# Patient Record
Sex: Female | Born: 1935
Health system: Southern US, Community
[De-identification: ages and names within clinical notes are randomized; demographics above are authoritative.]

## PROBLEM LIST (undated history)

## (undated) ENCOUNTER — Emergency Department (HOSPITAL_COMMUNITY): Admission: EM | Disposition: A | Discharge status: Home / Self Care | Payer: Self-pay

## (undated) DIAGNOSIS — I6529 Occlusion and stenosis of unspecified carotid artery: Secondary | ICD-10-CM

## (undated) DIAGNOSIS — K5792 Diverticulitis of intestine, part unspecified, without perforation or abscess without bleeding: Secondary | ICD-10-CM

## (undated) DIAGNOSIS — M1990 Unspecified osteoarthritis, unspecified site: Secondary | ICD-10-CM

## (undated) DIAGNOSIS — J449 Chronic obstructive pulmonary disease, unspecified: Secondary | ICD-10-CM

## (undated) DIAGNOSIS — M47816 Spondylosis without myelopathy or radiculopathy, lumbar region: Secondary | ICD-10-CM

## (undated) DIAGNOSIS — L821 Other seborrheic keratosis: Secondary | ICD-10-CM

## (undated) DIAGNOSIS — K529 Noninfective gastroenteritis and colitis, unspecified: Secondary | ICD-10-CM

## (undated) DIAGNOSIS — I10 Essential (primary) hypertension: Secondary | ICD-10-CM

## (undated) DIAGNOSIS — I4891 Unspecified atrial fibrillation: Secondary | ICD-10-CM

## (undated) HISTORY — DX: Occlusion and stenosis of unspecified carotid artery: I65.29

## (undated) HISTORY — PX: BREAST SURGERY: SHX581

## (undated) HISTORY — DX: Unspecified atrial fibrillation: I48.91

## (undated) HISTORY — PX: CHOLECYSTECTOMY: SHX55

## (undated) HISTORY — DX: Unspecified osteoarthritis, unspecified site: M19.90

## (undated) HISTORY — PX: BREAST EXCISIONAL BIOPSY: SUR124

## (undated) HISTORY — DX: Chronic obstructive pulmonary disease, unspecified: J44.9

## (undated) HISTORY — PX: REPLACEMENT TOTAL KNEE: SUR1224

## (undated) HISTORY — DX: Other seborrheic keratosis: L82.1

## (undated) HISTORY — PX: ABDOMINAL HYSTERECTOMY: SHX81

## (undated) HISTORY — DX: Noninfective gastroenteritis and colitis, unspecified: K52.9

## (undated) HISTORY — PX: JOINT REPLACEMENT: SHX530

---

## 1999-12-05 ENCOUNTER — Encounter: Payer: Self-pay | Admitting: Family Medicine

## 1999-12-05 ENCOUNTER — Encounter: Admission: RE | Admit: 1999-12-05 | Discharge: 1999-12-05 | Payer: Self-pay | Admitting: Family Medicine

## 1999-12-11 ENCOUNTER — Encounter: Admission: RE | Admit: 1999-12-11 | Discharge: 1999-12-11 | Payer: Self-pay | Admitting: Family Medicine

## 1999-12-11 ENCOUNTER — Encounter: Payer: Self-pay | Admitting: Family Medicine

## 2000-06-10 ENCOUNTER — Encounter: Admission: RE | Admit: 2000-06-10 | Discharge: 2000-06-10 | Payer: Self-pay | Admitting: Family Medicine

## 2000-06-10 ENCOUNTER — Encounter: Payer: Self-pay | Admitting: Family Medicine

## 2002-03-06 ENCOUNTER — Encounter: Payer: Self-pay | Admitting: Family Medicine

## 2002-03-06 ENCOUNTER — Encounter: Admission: RE | Admit: 2002-03-06 | Discharge: 2002-03-06 | Payer: Self-pay | Admitting: Family Medicine

## 2002-07-24 ENCOUNTER — Ambulatory Visit (HOSPITAL_COMMUNITY): Admission: RE | Admit: 2002-07-24 | Discharge: 2002-07-24 | Payer: Self-pay | Admitting: *Deleted

## 2002-07-24 ENCOUNTER — Encounter (INDEPENDENT_AMBULATORY_CARE_PROVIDER_SITE_OTHER): Payer: Self-pay | Admitting: *Deleted

## 2002-11-26 ENCOUNTER — Emergency Department (HOSPITAL_COMMUNITY): Admission: EM | Admit: 2002-11-26 | Discharge: 2002-11-26 | Payer: Self-pay | Admitting: Emergency Medicine

## 2002-11-27 ENCOUNTER — Ambulatory Visit (HOSPITAL_COMMUNITY): Admission: RE | Admit: 2002-11-27 | Discharge: 2002-11-28 | Payer: Self-pay | Admitting: General Surgery

## 2002-11-27 ENCOUNTER — Encounter (INDEPENDENT_AMBULATORY_CARE_PROVIDER_SITE_OTHER): Payer: Self-pay | Admitting: *Deleted

## 2003-03-04 ENCOUNTER — Ambulatory Visit (HOSPITAL_COMMUNITY): Admission: RE | Admit: 2003-03-04 | Discharge: 2003-03-04 | Payer: Self-pay | Admitting: Family Medicine

## 2003-03-24 ENCOUNTER — Encounter: Admission: RE | Admit: 2003-03-24 | Discharge: 2003-03-24 | Payer: Self-pay | Admitting: Family Medicine

## 2003-04-06 ENCOUNTER — Encounter: Admission: RE | Admit: 2003-04-06 | Discharge: 2003-04-06 | Payer: Self-pay | Admitting: Family Medicine

## 2004-03-29 ENCOUNTER — Encounter: Admission: RE | Admit: 2004-03-29 | Discharge: 2004-03-29 | Payer: Self-pay | Admitting: Gastroenterology

## 2004-04-10 ENCOUNTER — Encounter: Admission: RE | Admit: 2004-04-10 | Discharge: 2004-04-10 | Payer: Self-pay | Admitting: Family Medicine

## 2004-06-14 ENCOUNTER — Inpatient Hospital Stay (HOSPITAL_COMMUNITY): Admission: EM | Admit: 2004-06-14 | Discharge: 2004-06-17 | Payer: Self-pay | Admitting: Emergency Medicine

## 2004-06-17 ENCOUNTER — Ambulatory Visit: Payer: Self-pay | Admitting: Gastroenterology

## 2005-01-18 ENCOUNTER — Encounter (INDEPENDENT_AMBULATORY_CARE_PROVIDER_SITE_OTHER): Payer: Self-pay | Admitting: Specialist

## 2005-01-18 ENCOUNTER — Ambulatory Visit (HOSPITAL_COMMUNITY): Admission: RE | Admit: 2005-01-18 | Discharge: 2005-01-18 | Payer: Self-pay | Admitting: Gastroenterology

## 2005-04-16 ENCOUNTER — Encounter: Admission: RE | Admit: 2005-04-16 | Discharge: 2005-04-16 | Payer: Self-pay | Admitting: Family Medicine

## 2005-08-21 ENCOUNTER — Encounter: Admission: RE | Admit: 2005-08-21 | Discharge: 2005-08-21 | Payer: Self-pay | Admitting: Family Medicine

## 2006-01-31 ENCOUNTER — Encounter: Admission: RE | Admit: 2006-01-31 | Discharge: 2006-01-31 | Payer: Self-pay | Admitting: Family Medicine

## 2006-04-19 ENCOUNTER — Encounter: Admission: RE | Admit: 2006-04-19 | Discharge: 2006-04-19 | Payer: Self-pay | Admitting: Family Medicine

## 2006-11-19 ENCOUNTER — Encounter: Admission: RE | Admit: 2006-11-19 | Discharge: 2006-11-19 | Payer: Self-pay | Admitting: Family Medicine

## 2006-11-22 ENCOUNTER — Ambulatory Visit (HOSPITAL_COMMUNITY): Admission: RE | Admit: 2006-11-22 | Discharge: 2006-11-22 | Payer: Self-pay | Admitting: Family Medicine

## 2006-12-24 ENCOUNTER — Inpatient Hospital Stay (HOSPITAL_COMMUNITY): Admission: RE | Admit: 2006-12-24 | Discharge: 2006-12-29 | Payer: Self-pay | Admitting: Orthopedic Surgery

## 2007-04-21 ENCOUNTER — Encounter: Admission: RE | Admit: 2007-04-21 | Discharge: 2007-04-21 | Payer: Self-pay | Admitting: Family Medicine

## 2008-01-23 HISTORY — PX: CAROTID ENDARTERECTOMY: SUR193

## 2008-05-07 ENCOUNTER — Encounter: Admission: RE | Admit: 2008-05-07 | Discharge: 2008-05-07 | Payer: Self-pay | Admitting: Family Medicine

## 2008-10-18 ENCOUNTER — Ambulatory Visit: Payer: Self-pay | Admitting: Vascular Surgery

## 2008-10-20 ENCOUNTER — Encounter: Payer: Self-pay | Admitting: Vascular Surgery

## 2008-10-20 ENCOUNTER — Ambulatory Visit: Payer: Self-pay | Admitting: Vascular Surgery

## 2008-10-20 ENCOUNTER — Inpatient Hospital Stay (HOSPITAL_COMMUNITY): Admission: RE | Admit: 2008-10-20 | Discharge: 2008-10-21 | Payer: Self-pay | Admitting: Vascular Surgery

## 2008-11-02 ENCOUNTER — Ambulatory Visit: Payer: Self-pay | Admitting: Vascular Surgery

## 2009-05-05 ENCOUNTER — Ambulatory Visit: Payer: Self-pay | Admitting: Vascular Surgery

## 2009-05-09 ENCOUNTER — Encounter: Admission: RE | Admit: 2009-05-09 | Discharge: 2009-05-09 | Payer: Self-pay | Admitting: Family Medicine

## 2009-12-27 ENCOUNTER — Ambulatory Visit: Payer: Self-pay | Admitting: Vascular Surgery

## 2010-04-28 LAB — CBC
HCT: 43.3 % (ref 36.0–46.0)
MCHC: 34.3 g/dL (ref 30.0–36.0)
MCV: 86.4 fL (ref 78.0–100.0)
MCV: 86.6 fL (ref 78.0–100.0)
Platelets: 298 10*3/uL (ref 150–400)
Platelets: 347 10*3/uL (ref 150–400)
RBC: 4.46 MIL/uL (ref 3.87–5.11)
RBC: 5.01 MIL/uL (ref 3.87–5.11)
RDW: 13.2 % (ref 11.5–15.5)
RDW: 13.5 % (ref 11.5–15.5)

## 2010-04-28 LAB — BASIC METABOLIC PANEL
Calcium: 8.5 mg/dL (ref 8.4–10.5)
Creatinine, Ser: 0.76 mg/dL (ref 0.4–1.2)
Glucose, Bld: 146 mg/dL — ABNORMAL HIGH (ref 70–99)
Potassium: 3.6 mEq/L (ref 3.5–5.1)
Sodium: 136 mEq/L (ref 135–145)

## 2010-04-28 LAB — COMPREHENSIVE METABOLIC PANEL
ALT: 31 U/L (ref 0–35)
Alkaline Phosphatase: 73 U/L (ref 39–117)
BUN: 11 mg/dL (ref 6–23)
CO2: 26 mEq/L (ref 19–32)
Calcium: 9.2 mg/dL (ref 8.4–10.5)
Chloride: 104 mEq/L (ref 96–112)
GFR calc Af Amer: >60 mL/min (ref >60)
Potassium: 3.5 mEq/L (ref 3.5–5.1)

## 2010-04-28 LAB — URINE MICROSCOPIC-ADD ON

## 2010-04-28 LAB — URINALYSIS, ROUTINE W REFLEX MICROSCOPIC
Bilirubin Urine: NEGATIVE
Hgb urine dipstick: NEGATIVE
Ketones, ur: NEGATIVE mg/dL
Protein, ur: NEGATIVE mg/dL
Urobilinogen, UA: 0.2 mg/dL (ref 0.0–1.0)

## 2010-04-28 LAB — CROSSMATCH
ABO/RH(D): O POS
Antibody Screen: NEGATIVE

## 2010-04-28 LAB — PROTIME-INR: INR: 1 (ref 0.00–1.49)

## 2010-04-28 LAB — APTT: aPTT: 29 seconds (ref 24–37)

## 2010-04-30 ENCOUNTER — Inpatient Hospital Stay (INDEPENDENT_AMBULATORY_CARE_PROVIDER_SITE_OTHER)
Admission: RE | Admit: 2010-04-30 | Discharge: 2010-04-30 | Disposition: A | Discharge status: Home / Self Care | Payer: Medicare Other | Source: Ambulatory Visit | Attending: Emergency Medicine | Admitting: Emergency Medicine

## 2010-04-30 DIAGNOSIS — J069 Acute upper respiratory infection, unspecified: Secondary | ICD-10-CM

## 2010-06-01 ENCOUNTER — Other Ambulatory Visit: Payer: Self-pay | Admitting: Family Medicine

## 2010-06-01 DIAGNOSIS — Z1231 Encounter for screening mammogram for malignant neoplasm of breast: Secondary | ICD-10-CM

## 2010-06-06 NOTE — Procedures (Signed)
CAROTID DUPLEX EXAM   INDICATION:  Carotid disease, possible preop.   HISTORY:  Diabetes:  No.  Cardiac:  No.  Hypertension:  Yes.  Smoking:  Previous.  Previous Surgery:  No.  CV History:  Asymptomatic.  Amaurosis Fugax No, Paresthesias No, Hemiparesis No                                       RIGHT             LEFT  Brachial systolic pressure:         150               144  Brachial Doppler waveforms:         WNL               WNL  Vertebral direction of flow:                          antegrade  DUPLEX VELOCITIES (cm/sec)  CCA peak systolic                                     M=106, D=235.  ECA peak systolic                                     206  ICA peak systolic                                     420  ICA end diastolic                                     93  PLAQUE MORPHOLOGY:                                    heterogeneous  PLAQUE AMOUNT:                                        severe  PLAQUE LOCATION:  bifurcation/origins of ICA and ECA   IMPRESSION:  1. Limited left-sided-only study.  2. Focal stenosis of distal common carotid artery with velocities of      235 cm/s. extending into the origin of internal carotid artery and      external carotid artery.  3. Left internal carotid artery velocities are suggestive of high-end      60-79% stenosis.  4. Left external carotid artery stenosis.   ___________________________________________  Quita Skye Hart [place], M.D.   AS/MEDQ  D:  10/18/2008  T:  10/18/2008  Job:  [id number]

## 2010-06-06 NOTE — Op Note (Signed)
NAME:  [name], [name] NO.:  [id number]   MEDICAL RECORD NO.:  [id number]           PATIENT TYPE:   LOCATION:                                 FACILITY:   PHYSICIAN:  Burnard Bunting, M.D.    DATE OF BIRTH:  09-May-1934   DATE OF PROCEDURE:  DATE OF DISCHARGE:                               OPERATIVE REPORT   PREOPERATIVE DIAGNOSIS:  Right knee arthritis.   POSTOPERATIVE DIAGNOSIS:  Right knee arthritis.   PROCEDURE:  Right total knee replacement.   SURGEON:  Burnard Bunting, M.D.   ASSISTANT:  Wende Neighbors, P.A.   ANESTHESIA:  General endotracheal.   ESTIMATED BLOOD LOSS:  75 mL.   DRAINS:  None.   TOURNIQUET TIME:  1 hour 30 minutes at 300 mmHg.   INDICATIONS:  [name] is a 76 year old patient with end-stage knee  arthritis affecting her ADLs, __________  and rest pain and presents for  total knee replacement after explanation of risks and benefits.   PROCEDURE IN DETAIL:  The patient was brought to the operating room  where general endotracheal anesthesia was induced.  Perioperative  antibiotics were administered.  The patient's right leg was prepped with  DuraPrep solution and draped in a sterile manner.  [name] was used to  cover the operative field.  The leg was elevated and exsanguinated with  an Esmarch wrap and the tourniquet was inflated.  Over a bolster  anterior approach to the knee was utilized with skin and subcutaneous  tissue sharply divided and a median parapatellar arthrotomy was made.  Precise location of this arthrotomy was marked by a #1 Vicryl suture.  Patella was everted.  Synovitis was present and was removed.  The  lateral patellofemoral ligament was then removed.  Periosteal sleeve was  removed off the medial proximal tibia.  Osteophytes were removed from  the femur and tibia.  ACL and PCL was released.  The soft tissue and  synovitis was removed to the anterior distal part of the femur.  Two  pins were placed in bicortical  fashion through the anterior distal  medial femur and proximal medial tibia.  At this time the registration  points were obtained including the center rotation of the hip, medial  malleolar axis and the various points about the knee in accordance with  preoperative templating.  The mechanical axis was cut perpendicular and  mechanical axis was made with the collateral and cruciate ligaments well  protected.  At this time tensioner was placed in extension and flexion.  Distal femoral cut was then made and checked with spacer block, full  extension was possible with a 10 mm spacer.  Chamfer cuts were then  performed.  Box cut was performed.  Final preparation of the tibial  plateau was made.  The trial components were placed with the 10 spacer,  3 femur and 3 tibia.  At this time the patella was repaired with 10 mm  cut made off of the patella.  The trial button was placed, 38 mm.  The  knee was taken  through a range of motion and found to have full  extension, full flexion with good stability and varus-valgus stress at  0, 30 and 90 degrees.  At this time trial components were removed.  True  components were cemented in position with maintenance of patellar  stability parameters and varus-valgus stability.  Excess cement was  removed.  The tourniquet was then released after cement hardening had  occurred.  Bleeding points were then controlled using electrocautery.  Thorough irrigation was performed before cementing and after cementing.  The arthrotomy was then closed over a bolster with interrupted inverted  #1 Vicryl suture, 0 Vicryl suture and interrupted inverted 2-0 Vicryl  suture and skin staples.  The incisions for the pins were also closed  using 3-0 nylon suture.  At this time the solution of Marcaine, morphine  and clonidine was injected into the knee.  The patient tolerated the  procedure well without immediate complications.  Bulky dressing and knee  immobilizer was placed.  It  should be noted that the assistance of  Maud Deed was required at all times during the case for retraction  of important neurovascular structures.  Her assistance was a medical  necessity during the case.      Burnard Bunting, M.D.  Electronically Signed     GSD/MEDQ  D:  12/24/2006  T:  12/24/2006  Job:  [id number]

## 2010-06-06 NOTE — Procedures (Signed)
CAROTID DUPLEX EXAM   INDICATION:  Left CEA.   HISTORY:  Diabetes:  No.  Cardiac:  No.  Hypertension:  Yes.  Smoking:  Previous.  Previous Surgery:  Left CEA 10/20/2008.  CV History:  Asymptomatic.  Amaurosis Fugax No, Paresthesias No, Hemiparesis No.                                       RIGHT             LEFT  Brachial systolic pressure:         134               140  Brachial Doppler waveforms:         WNL               WNL  Vertebral direction of flow:        Antegrade         Antegrade  DUPLEX VELOCITIES (cm/sec)  CCA peak systolic                   81                106  ECA peak systolic                   584               171  ICA peak systolic                   84 (mid)          93  ICA end diastolic                   24                19  PLAQUE MORPHOLOGY:                  Heterogenous      Soft  PLAQUE AMOUNT:                      Severe            Mild  PLAQUE LOCATION:                    ECA               CCA/ICA   IMPRESSION:  1. Patent left carotid endarterectomy with mild replaquing.  2. Severe right external carotid artery stenosis.  3. 1% to 39% right internal carotid artery stenosis.  4. Antegrade vertebral arteries bilaterally.  5. No significant change from the prior study of 05/05/2009.   ___________________________________________  Quita Skye. Hart [place], M.D.   LT/MEDQ  D:  12/27/2009  T:  12/27/2009  Job:  [id number]

## 2010-06-06 NOTE — H&P (Signed)
HISTORY AND PHYSICAL EXAMINATION   October 18, 2008   Re:  [name], [name]             DOB:  Jul 19, 1934   CHIEF COMPLAINT:  Severe left internal carotid stenosis - Asymptomatic.   HISTORY OF PRESENT ILLNESS:  This is a 76 year old female patient of Dr.  Manus Gunning has been known to have some carotid occlusive disease with an  ultrasound having been done 7 years ago.  The study was repeated on  September 3rd, which revealed progression of disease to an approximate  80% left internal carotid stenosis with mild right internal carotid  stenosis.  The study was repeated in the [facility] office on September 27  confirming an 80-90% left internal carotid stenosis.  She had no  symptoms of cerebral ischemia, including hemiparesis, aphasia, amaurosis  fugax, diplopia, blurred vision, or syncope.  Has had no previous  history of stroke, now scheduled for an elective left carotid  endarterectomy.   PAST MEDICAL HISTORY:  1. Hypertension.  2. Osteoarthritis.  3. GERD.  4. History of diverticulosis.  5. Negative for diabetes, hyperlipidemia, COPD, or coronary artery      disease.   PREVIOUS SURGERY:  1. Total knee replacement in 2008 on the right.  2. Hysterectomy.  3. Cholecystectomy.   FAMILY HISTORY:  Positive for coronary artery disease in a brother who  died of myocardial infarction.  Diabetes in a brother and sister.  Mini  strokes in a sister.   SOCIAL HISTORY:  The patient is widowed and is retired.  She smoked a  pack-and-a-half of cigarettes a day for 50 years, but stopped in 2004.  Does not use alcohol.   REVIEW OF SYSTEMS:  Please see health history exam.  She does have  dyspnea on exertion, history of reflux esophagitis, discomfort in the  legs with walking, headaches, arthritis, joint pain.  All other systems  negative.   ALLERGIES:  None known.   MEDICATIONS:  1. Dicyclomine 5 mg.  2. Sertraline 50 mg.  3. Famotidine 75 mg.  4. Amlodipine benazepril 10  mg.  5. Losartan potassium 50 mg  6. Lorazepam 1 mg.  7. Pulmicort Flexhaler 180 mg p.r.n.  8. Voltaren gel p.r.n.  9. Prednisone 5 mg.  10.Aspirin 81 mg daily.  11.Fish oil capsule 1200 mg per day.  12.Multivitamin daily.   PHYSICAL EXAM:  Blood pressure 120/74, heart rate 66, respirations 18.  Generally, this is a healthy-appearing female in no apparent distress.  Alert and oriented x3.  Neck is supple.  3+ carotid pulses with  bilateral bruits over the carotid bifurcations.  Neurologic exam is  normal.  No palpable adenopathy in the neck.  Chest is clear to  auscultation.  Cardiovascular exam is a regular rhythm.  No murmurs.  Abdomen is soft, nontender with no masses.  3+ femoral, popliteal, and  dorsalis pedis pulses bilaterally.  There is no evidence of venous  insufficiency.   Carotid duplex exam results from [facility] and [facility] were reviewed and both  reveal severe left internal carotid stenosis.   IMPRESSION:  Severe left internal carotid stenosis - Asymptomatic.   PLAN:  To admit the patient on September 29 for an elective carotid  endarterectomy.  The risks and benefits have been thoroughly discussed  with the patient.  She would like to proceed.   Quita Skye Hart [place], M.D.  Electronically Signed   JDL/MEDQ  D:  10/18/2008  T:  10/18/2008  Job:  [id number]  cc:   Bryan Lemma. Manus Gunning, M.D.

## 2010-06-06 NOTE — Assessment & Plan Note (Signed)
OFFICE VISIT   TANAIA, HAWKEY  DOB:  [birth year]                                       11/02/2008  CHART#:[id number]   The patient returns today for initial followup regarding her carotid  endarterectomy performed by me on September 29 for severe left internal  carotid stenosis which is asymptomatic.  She has mild right internal  carotid occlusive disease.  She has had no neurologic complications or  specific complaints since her surgery.  She is swallowing well, has no  hoarseness and has resumed her daily aspirin.  She is also resuming her  normal activity level.   PHYSICAL EXAM:  Blood pressure 138/75, heart rate 79, respirations 14.  Left neck incision is well-healed.  Carotid pulses are 3+ with no  audible bruits.  Neurologic exam is normal.   I am very pleased with her early result and I encouraged her to continue  to increase her activity as tolerated.  She will plan to return in 6  months with followup carotid duplex exam at that time unless she  develops any neurologic symptoms in the interim.   Quita Skye Hart [place], M.D.  Electronically Signed   JDL/MEDQ  D:  11/02/2008  T:  11/03/2008  Job:  [id number]   cc:   Bryan Lemma. Manus Gunning, M.D.

## 2010-06-06 NOTE — Procedures (Signed)
CAROTID DUPLEX EXAM   INDICATION:  Status post carotid endarterectomy.   HISTORY:  Diabetes:  No.  Cardiac:  No.  Hypertension:  Yes.  Smoking:  Previous.  Previous Surgery:  Left carotid endarterectomy on 10/20/2008.  CV History:  Currently asymptomatic.  Amaurosis Fugax No, Paresthesias No, Hemiparesis No.                                       RIGHT             LEFT  Brachial systolic pressure:         138               130  Brachial Doppler waveforms:         Normal            Normal  Vertebral direction of flow:        Antegrade         Antegrade  DUPLEX VELOCITIES (cm/sec)  CCA peak systolic                   60                103  ECA peak systolic                   527               136  ICA peak systolic                   45                101  ICA end diastolic                   14                29  PLAQUE MORPHOLOGY:                  Heterogenous  PLAQUE AMOUNT:                      Moderate/severe   None  PLAQUE LOCATION:                    ECA   IMPRESSION:  1. No evidence of right internal carotid artery stenosis.  2. Patent left carotid endarterectomy site with no left internal      carotid artery stenosis noted.  3. Right external carotid artery stenosis noted.  4. Significant decrease of the left internal carotid artery velocities      noted when compared to the previous examination on 10/18/2008.   ___________________________________________  Quita Skye. Hart [place], M.D.   CH/MEDQ  D:  05/05/2009  T:  05/05/2009  Job:  [id number]

## 2010-06-08 ENCOUNTER — Ambulatory Visit
Admission: RE | Admit: 2010-06-08 | Discharge: 2010-06-08 | Disposition: A | Discharge status: Home / Self Care | Payer: Medicare Other | Source: Ambulatory Visit | Attending: Family Medicine | Admitting: Family Medicine

## 2010-06-08 DIAGNOSIS — Z1231 Encounter for screening mammogram for malignant neoplasm of breast: Secondary | ICD-10-CM

## 2010-06-09 NOTE — Consult Note (Signed)
[name], LYNE                   ACCOUNT NO.:  [id number]   MEDICAL RECORD NO.:  [id number]          PATIENT TYPE:  INP   LOCATION:  0348                         FACILITY:  [facility]   PHYSICIAN:  Jordan Hawks. Elnoria Howard, MD    DATE OF BIRTH:  1934-10-31   DATE OF CONSULTATION:  DATE OF DISCHARGE:                                   CONSULTATION   DATE OF CONSULTATION:  Jun 16, 2004.   REASON FOR CONSULTATION:  Diarrhea.   PRIMARY CARE PHYSICIAN:  Dr. Bryan Lemma. Ehinger.   HISTORY OF PRESENT ILLNESS:  This is a 76 year old white female with a past  medical history of hypertension, diverticulosis, anxiety, gastroesophageal  reflux disease and irritable bowel, who presents with worsening diarrhea  over the past several weeks as well as weakness.  The patient was initially  seen by Dr. Roosvelt Harps in 2004 with subsequent colonoscopy and  biopsies.  Unfortunately the biopsies were not available for review, and per  the patient's report she was not informed of any further treatment action as  a result of the colonoscopy.  The patient subsequently transferred her care  to Dr. Anselmo Rod, and she was initially felt to have irritable bowel  syndrome versus post cholecystectomy diarrhea.  Her symptoms waxed and  waned, and it was difficult to discern the exact etiology.  She was  subsequently seen by Dr. Elnoria Howard on May 17, 2004 with complaints of worsening  diarrhea.  However, this was not a large amount of stool.  She stated that  she had multiple small bowel movements at this time.  She was also noted to  be grieving the loss of her husband recently, and given the diagnosis of  anxiety and also irritable bowel syndrome, it was felt that she may be  having an exacerbation of these type of symptoms.  It was recommended at  that time that the patient maintain a high-fiber diet with Metamucil.  She  reported improvement in her symptoms for a short duration.  However, she  stated her bowel  movements began to worsen again.  She reported having 10-15  watery bowel movements per day with some cramping.  No nausea or vomiting.  No fevers.  No evidence of any hematochezia.  Upon admission to [facility], she was noted to be dehydrated and subsequently improved with IV  hydration.  The patient denies any hematemesis, hematochezia or melena.   PAST MEDICAL/SURGICAL HISTORY:  As stated above.   MEDICATIONS:  1.  Metamucil.  2.  Questran.  3.  Aspirin.  4.  Benicar.  5.  Lorazepam.  6.  Zoloft.  7.  Bentyl.   ALLERGIES:  1.  AMOXICILLIN.  2.  TETRACYCLINE.   SOCIAL HISTORY:  The patient lives alone and is a widow.  She denies any  alcohol.  She has a prior history of tobacco abuse.   FAMILY HISTORY:  Noncontributory.   REVIEW OF SYSTEMS:  Negative for any dysuria, arthralgias, arthritis, chest  pain, shortness of breath, headache, dizziness, vertigo.  GI symptoms are  stated above.   PHYSICAL EXAMINATION:  VITAL SIGNS:  Stable.  GENERAL:  The patient is in no acute distress.  Alert and oriented.  HEENT:  Normocephalic, atraumatic.  Extraocular muscles intact.  Pupils are  equal, round and reactive to light.  NECK:  Supple.  No lymphadenopathy.  LUNGS:  Clear to auscultation bilaterally.  CARDIOVASCULAR:  Regular rate and rhythm without murmurs, gallops or rubs.  ABDOMEN:  Soft, nontender, nondistended.  Positive bowel sounds.  No  hepatosplenomegaly.  EXTREMITIES:  No clubbing, cyanosis or edema.  NEUROLOGIC:  No focal deficits noted.   LABORATORY VALUES:  On admission, white blood cell count 9.6, hemoglobin  14.9, MCV 84.9, platelets 407.  Sodium 133, potassium 3.2, chloride 94, CO2  25, glucose 125, BUN 11, creatinine 1.6.  Calcium is 10.1.   IMPRESSION:  1.  Colitis with resultant diarrhea.  2.  Dehydration secondary to the colitis.   The patient does have a 2-year history of diarrhea, and because of her  history of cholecystitis and possible  irritable bowel syndrome, it was  difficult to discern the exact etiology of the colitis.  The biopsy reports  were not available at the time of evaluation in the office.  However, upon  review of the random colonic biopsies, it is clear that she does have active  colitis.  The colitis is not indicative of a microscopic colitis.  However,  there is just suspicion of possible NSAID use as a result of this.  There is  a large number of eosinophils, which can also lead to the suspicion of  parasites.  However, none have been detected at this time.  It is likely  that the patient will improve with Asacol to help resolve the inflammation.   PLAN:  1.  Asacol 400 mg, 2 tablets p.o. t.i.d. starting today.  2.  Avoid all NSAIDs.      PDH/MEDQ  D:  06/16/2004  T:  06/16/2004  Job:  [id number]   cc:   Bryan Lemma. Manus Gunning, M.D.  [address] [place]  [place] [address]  Fax: [phone]

## 2010-06-09 NOTE — H&P (Signed)
NAMEELLIANAH, CORDY                   ACCOUNT NO.:  [id number]   MEDICAL RECORD NO.:  [id number]          PATIENT TYPE:  INP   LOCATION:  0104                         FACILITY:  [facility]   PHYSICIAN:  Kela Millin, M.D.DATE OF BIRTH:  09-Apr-1934   DATE OF ADMISSION:  06/14/2004  DATE OF DISCHARGE:                                HISTORY & PHYSICAL   PRIMARY CARE PHYSICIAN:  Dr. Blair Heys.   CHIEF COMPLAINT:  Weakness and persistent diarrhea.   HISTORY OF PRESENT ILLNESS:  The patient is a 76 year old white female with  past medical history significant for irritable bowel syndrome, GERD,  hypertension, diverticulosis, anxiety who presents with complaints of  weakness and worsening diarrhea over the past couple of weeks. She states  that she has been having diarrhea daily for the past 2-3 months but in the  past couple of weeks that has worsened. She reports an average of 10 to 15  watery stools per day, watery, with mucus present and she usually has some  cramping before the bowel movement and that cramping is usually relieved by  the bowel movement. She admits to nausea with no vomiting but in the past 2  days she states she has just not been able to eat anything. The patient  reports that she saw Dr. Loreta Ave in March and at that time she was started on  the Questran but that did not help her diarrhea. She went back a couple of  weeks later and was seen by Dr. Elnoria Howard and started on Metamucil which seemed  to help initially, but in the past weeks the diarrhea has worsened. Ms. Battey  denies hematemesis, melena, hematochezia, fevers, dysuria, cough, and no  chest pain.   The patient was seen in the emergency room and her lab work and physical  exam were consistent with volume depletion, electrolyte abnormalities, and  she is admitted to the [facility] for further evaluation and  management.   PAST MEDICAL HISTORY:  As stated above.   MEDICATIONS:  1.  Zoloft 50 mg  p.o. daily.  2.  Lorazepam 1 mg p.o. b.i.d.  3.  Benicar - the patient does not know the dose.  4.  Aspirin 81 mg p.o. daily.  5.  Questran.  6.  Metamucil.  7.  Bentyl.   The patient states she stopped per Prevacid because she thought it was  aggravating her diarrhea.   ALLERGIES:  AMOXICILLIN and TETRACYCLINE.   SOCIAL HISTORY:  She quit tobacco 1-and-a-half years ago. She denies  alcohol.   FAMILY HISTORY:  A brother and two sisters with diabetes mellitus and all  her siblings have hypertension. She has two brothers with heart problems  at age 28 and 66.   REVIEW OF SYSTEMS:  As per HPI. Other comprehensive review of systems  negative.   PHYSICAL EXAMINATION:  GENERAL:  The patient is a pleasant, elderly white  female, alert and oriented x3, in no apparent distress.  VITAL SIGNS:  Blood pressure is 126/80 with a temperature of 98, pulse 69,  respiratory rate 20,  O2 saturation of 96%.  HEENT:  PERRL, EOMI, sclerae anicteric, dry mucous membranes, no oral  exudates.  NECK:  Supple. No adenopathy, no carotid bruits appreciated, and no  thyromegaly.  LUNGS:  Clear to auscultation bilaterally. No crackles or wheezes.  CARDIOVASCULAR:  Regular rate and rhythm, normal S1, S2.  ABDOMEN:  Soft, bowel sounds present, nontender, nondistended. No  organomegaly and no masses palpable.  EXTREMITIES:  No cyanosis and no edema.  NEUROLOGIC:  Alert and oriented x3. Cranial nerves II-XII grossly intact.  Nonfocal exam.   LABORATORY DATA:  Her white cell count is 9.6 with a hemoglobin of 14.9,  hematocrit 44.3, and her neutrophil count is 78%. Her sodium is 133 with a  potassium of 3.2, chloride is 94, CO2 is 25, her glucose is 125, BUN is 11,  creatinine is 1.6, calcium 10.1. On her urinalysis there is small leukocyte  esterase and urine nitrite is negative, 3-6 wbc's, the specific gravity is  1.024, and appearance is cloudy, the urine pH is 5.   ASSESSMENT AND PLAN:  1.  Diarrhea.  The patient is a 76 year old with irritable bowel syndrome and      diarrhea has worsened in the past couple of weeks in spite of Metamucil      and Questran. Will obtain stool study/cultures to evaluate for      infectious etiology. The patient was seen by Dr. Loreta Ave in March, follow      and consult GI pending above for further recommendations/possible      colonoscopy.  2.  Volume depletion/renal insufficiency. Hydrate with IV fluids and recheck      BMET. Will hold ARB for now.  3.  Irritable bowel syndrome. Continue Bentyl, Metamucil, and Questran.  4.  Hypertension. Monitor blood pressures, hold ARB for now as creatinine      increased. Follow and resume as appropriate when the patient is volume      replete.  5.  Gastroesophageal reflux disease. As already discussed, was previously on      a PPI but discontinued it because it worsened her diarrhea.  6.  History of anxiety. Continue Ativan.      ACV/MEDQ  D:  06/14/2004  T:  06/14/2004  Job:  [id number]   cc:   Bryan Lemma. Manus Gunning, M.D.  [address] [place]  [place] [address]  Fax: [phone]

## 2010-06-09 NOTE — Discharge Summary (Signed)
[name], [name]                   ACCOUNT NO.:  [id number]   MEDICAL RECORD NO.:  [id number]          PATIENT TYPE:  INP   LOCATION:  0348                         FACILITY:  [facility]   PHYSICIAN:  Lori L. Ladona Ridgel, MD  DATE OF BIRTH:  1934-04-18   DATE OF ADMISSION:  06/14/2004  DATE OF DISCHARGE:  06/17/2004                                 DISCHARGE SUMMARY   DISCHARGE DIAGNOSES:  1.  Unspecified colitis.  The patient has responded favorably to the      addition of Asacol 400 mg t.i.d.  She was seen in consult by      gastroenterology and Dr. Haywood Frey service, as well as being covered by Dr.      Russella Frey.  The patient states that this morning she was able to sleep      through the night for the first time without having to get up to move      her bowels.  Her morning bowel movement was formed, which is not her      usual state.  She generally feels fantastic related to these events.      The patient will be discharged to home with Asacol, Metamucil, Bentyl as      needed, and will discontinue her Questran.  2.  Dehydration.  This is secondary to colitis.  This appears to have      resolved significantly.  Her blood pressure is stable off of her      Benicar.  At this time I will request that with the recent dehydration      and acute elevation in her creatinine that we will hold off on her ARB      until she sees her primary care physician next week.  3.  Hypertension.  At this time it is moderately elevated but clinically      stable off of her Benicar.  In light of the mild elevation in her      creatinine, I will request that she hold her Benicar until she sees Dr.      Gaylan Frey next week.  4.  Anxiety disorder.  The patient will continue her Zoloft 50 mg daily and      her lorazepam 1 mg p.o. b.i.d.  5.  The patient has been on a prophylactic aspirin daily, which was held      during the course of the admission.  There is no reason not to restart      this in light of the fact that her  diarrhea is under control.  We will      resume her aspirin 81 mg daily.  6.  Reflux.  The patient had discontinued her Prevacid because she thought      it was aggravating her diarrhea.  At this time I will allow Dr. Elnoria Frey to      resume that when he sees her in two-four weeks.   MEDICATIONS AT THE TIME OF DISCHARGE:  1.  Asacol 400 mg two tablets p.o. t.i.d.  2.  Metamucil one tablet daily.  3.  Zoloft 50 mg daily.  4.  Lorazepam 1 mg p.o. b.i.d.  5.  Aspirin 81 mg can be resumed.  6.  Bentyl 20 mg four times daily.  This can become as needed as per Dr.      Russella Frey.  7.  Benicar will continue to be held until she sees Dr. Gaylan Frey in the next      week.  8.  Her Questran will be discontinued.   HISTORY OF PRESENT ILLNESS:  The patient is a 76 year old white female with  a past medical history known for irritable bowel syndrome, GERD,  hypertension, diverticulosis, as well as a diagnosis of anxiety disorder.  She was admitted on Jun 14, 2004 with a complaint of weakness and worsening  diarrhea over the past couple of weeks.  The patient was found to have acute  renal insufficiency likely related to dehydration secondary to her diarrhea.  She was admitted to the telemetry floor for further evaluation and  treatment.  The patient was rehydrated.  Medications thought to be  exacerbating her diarrhea were held, including her Benicar and aspirin.  She  was evaluated for possible C. difficile colitis, which was found to be  negative.  On Jun 16, 2004 the patient was seen in consultation by Dr. Elnoria Frey,  who started her on Asacol 400 mg two tablets p.o. b.i.d. for treatment of  colitis.  Over the course of the night the patient responded favorably with  decreased stooling.  This morning her stool was firm and was more formed and  she felt well.  Her blood pressure remained stable but slightly elevated.  She will therefore need evaluation as an outpatient for resumption of her  ERB.  On the day of  discharge the patient's vital signs are stable.  Blood  pressure was 154/77, pulse was 67, respirations were 20, temperature was  97.8.  Her saturations were 98% on room air.  Generally she is a well-  developed well-nourished white female in acute distress.  Pupils equal,  round, and reactive to light.  Extra ocular muscles are intact.  Mucous  membranes are moist.  Neck is supple.  There is no JVD.  No lymphadenopathy.  No carotid bruits.  Her chest is clear to auscultation.  There are no  rhonchi, rubs, or wheezes.  Cardiovascular is a regular rate and rhythm.  Positive S1 and S2.  No S3 or S4.  Abdomen soft, nontender, and nondistended  with positive bowel sounds.  Extremities show no cyanosis, clubbing, or  edema.   PERTINENT LABORATORY VALUES:  During the course of the hospital stay reveal  a discharging hemoglobin of 14.9, hematocrit of 44.3, white count is 9.6,  with platelets of 407.  Her discharging BUN is 7 and 1.0.  During her  admission her creatinine was 1.6.  White blood cells examined in stool shows  none.  C. difficile toxin was negative.  No ova and parasites were noted.  No suspicious colonies grew from her stool cultures and her occult checks  for her stool were also negative.  Her TSH was 1.597.   DISPOSITION:  At this time the patient is deemed stable for discharge.   FOLLOWUP:  With Dr. Gaylan Frey and Dr. Elnoria Frey.       MLT/MEDQ  D:  06/17/2004  T:  06/17/2004  Job:  [id number]   cc:   [name]. Lori Howard, MD  Fax: [phone]

## 2010-06-09 NOTE — Op Note (Signed)
NAME:  [name], [name]                             ACCOUNT NO.:  [id number]   MEDICAL RECORD NO.:  [id number]                   PATIENT TYPE:  OIB   LOCATION:  5703                                 FACILITY:  [facility]   PHYSICIAN:  Gabrielle Dare. Janee Morn, M.D.             DATE OF BIRTH:  [birth year]   DATE OF PROCEDURE:  11/27/2002  DATE OF DISCHARGE:  11/28/2002                                 OPERATIVE REPORT   PREOPERATIVE DIAGNOSIS:  Acute cholecystitis.   POSTOPERATIVE DIAGNOSIS:  Acute cholecystitis.   PROCEDURE:  Laparoscopic cholecystectomy.   SURGEON:  Gabrielle Dare. Janee Morn, M.D.   ASSISTANT:  Jimmye Norman, M.D.   ANESTHESIA:  General.   HISTORY OF PRESENT ILLNESS:  The patient is a 76 year old white female who  was seen yesterday morning in the [facility] emergency department  with right upper quadrant abdominal pain.  Liver function tests were normal.  Her ultrasound showed stones in her gallbladder, but no gallbladder wall  thickening or pericholecystic fluid.  She continued to have pain after being  subsequently discharged.  I evaluated her in our urgent office and scheduled  her for a cholecystectomy today.   DESCRIPTION OF PROCEDURE:  Informed consent was obtained.  The patient  received intravenous antibiotics and she was taken to the operating room.  General anesthesia was administered and her abdomen was prepped and draped  in sterile fashion. A curvilinear infraumbilical incision was made and  subcutaneous tissues were dissected down revealing the anterior fascia.  This was divided sharply.  Next, the peritoneal cavity was entered under  direct vision without difficulty.  A 0 Vicryl pursestring suture was placed  around the fascial opening and the Hasson trocar was inserted in to the  abdomen.  The abdomen was insufflated with carbon dioxide in standard  fashion under direct vision. An 11 mm epigastric and two 5 mm lateral ports  were placed.  The dome of the  gallbladder was retracted superomedially and  the infundibulum was retracted inferolaterally.  The gallbladder appeared  erythematous and acutely inflamed. As we began our dissection it was noted  to be quite edematous as well and dissection began laterally and progressed  medially revealing the cystic duct and the cystic artery was also dissected  out more medially.  This had a posterior branch which was visualized as  well.  Both the main cystic artery and the posterior branch were clipped  twice proximally, once distally and both were divided.  The dissection  continued until the cystic duct which was clearly visible had a large window  between the cystic duct and the infundibulum of the gallbladder and the  liver.  The cystic duct was a couple of cm in length. A small nick was made  in the cystic duct and a readied cholangiogram catheter was attempted to be  inserted, but it was only able to be inserted  about a half a cm and kept  getting hung up.  We tried to dilate the lumen as it seemed like it was  getting hung up on the valve, but it was not technically possible to  complete the cholangiogram, so it was abandoned after several attempts.  At  this time, three clips were placed proximally on the cystic duct and it was  divided.  The gallbladder was then taken off the liver bed with the Bovie  cautery.  During this dissection, one other small arterial was noted and one  clip was placed on this and it was Bovied distally.  The cautery was used to  remove the gallbladder from the liver bed completely.  Several areas on the  liver bed were cauterized to get excellent hemostasis.  The gallbladder was  placed in an EndoCatch bag and removed from the abdomen via the umbilical  port site.  The abdomen was then copiously irrigated.  The liver bed was  checked again for hemostasis and two spots were cauterized.  Excellent  hemostasis was obtained.  The irrigation fluid returned clear.  After   checking the liver bed one more time for bleeding and noting it to be  hemostatic, the pneumoperitoneum was released after removing the ports under  direct vision.  The irrigation fluid had been removed.  The Hasson trocar  was removed and the pneumoperitoneum was  evacuated.  The fascia of the  infraumbilical site was closed by tying the pursestring Vicryl suture and  1/4% Marcaine with epinephrine had been used at all port sites.  The  incisions were copiously irrigated and each was closed with a running 4-0  Vicryl subcuticular stitch.  Sponge, needle and instrument counts were  correct.  The patient tolerated the procedure well without apparent  complications.  Benzoin, Steri-Strips and sterile dressings were placed on  the wounds.  She was taken to the recovery room in stable condition.                                               Gabrielle Dare Janee Morn, M.D.    BET/MEDQ  D:  11/27/2002  T:  11/28/2002  Job:  [id number]

## 2010-06-09 NOTE — Discharge Summary (Signed)
NAMESHERRE, WOOTON                   ACCOUNT NO.:  [id number]   MEDICAL RECORD NO.:  [id number]          PATIENT TYPE:  INP   LOCATION:  5033                         FACILITY:  [facility]   PHYSICIAN:  Burnard Bunting, M.D.    DATE OF BIRTH:  01-12-1935   DATE OF ADMISSION:  12/24/2006  DATE OF DISCHARGE:  12/29/2006                               DISCHARGE SUMMARY   ADMISSION DIAGNOSES:  1. Right knee osteoarthritis.  2. Gastroesophageal reflux disease.  3. Hypertension  4. Anxiety and depression.   DISCHARGE DIAGNOSES:  1. Right knee osteoarthritis.  2. Gastroesophageal reflux disease.  3. Hypertension  4. Anxiety and depression.  5. Posthemorrhagic anemia.   PROCEDURES:  On December 24, 2006, the patient underwent right total knee  arthroplasty by Dr. August Saucer.   CONSULTATIONS:  None.   BRIEF HOSPITAL COURSE:  The patient is a 76 year old female who  underwent right total knee arthroplasty and tolerated the procedure  without complications.  Postoperatively, she was placed on the usual  total knee replacement protocol for physical therapy involving  ambulation and gait training.  She was allowed weightbearing as  tolerated utilizing a walker.  She received CPM machine for range of  motion.  Also, strengthening, stretching and range of motion exercises  by the physical therapist.  Occupational therapy assisted with ADLs.  Prior to discharge, the patient was able to ambulate 250 feet prior to  discharge with a standard walker.  She was wearing her knee immobilizer  for ambulation.  The patient was started on Coumadin for DVT and PE  prophylaxis.  Adjustments in Coumadin made by the pharmacist.  Hemovac  drain was discontinued on the first postoperative day, and her wound was  healing well following this.  Hemoglobin and hematocrit dropped to the  lowest value of 10.5 and 31postoperatively.  At discharge, values 11.5  and 33.2.  INR at discharge 2.  Chemistry studies with hyponatremia  at  133.  Otherwise, normal values.  Urinalysis negative for urinary tract  infection.  Urine culture negative.   PLAN:  The patient was discharged to home.  There she will continue a  CPM machine.  She will continue with ambulation, utilizing a walker.  Home health physical therapy, occupational therapy and durable medical  equipment made available for the patient.  Coumadin management also by  the home health agency.  Dressing change to be done  daily.  She will see Dr. August Saucer 2 weeks from surgery.  All questions  encouraged and answered.  Prescriptions at discharge:  Percocet for  pain, Robaxin for spasm and Coumadin for a total of 4 weeks.   CONDITION ON DISCHARGE:  Stable.      Wende Neighbors, P.A.      Burnard Bunting, M.D.  Electronically Signed    SMV/MEDQ  D:  01/30/2007  T:  01/31/2007  Job:  [id number]

## 2010-10-30 LAB — BASIC METABOLIC PANEL
BUN: 6
BUN: 9
CO2: 26
CO2: 28
Calcium: 8.3 — ABNORMAL LOW
Chloride: 103
Chloride: 99
Creatinine, Ser: 0.85
GFR calc Af Amer: >60
GFR calc non Af Amer: 43 — ABNORMAL LOW
Glucose, Bld: 113 — ABNORMAL HIGH
Glucose, Bld: 121 — ABNORMAL HIGH
Potassium: 3.9
Sodium: 133 — ABNORMAL LOW

## 2010-10-30 LAB — CBC
HCT: 32.5 — ABNORMAL LOW
Hemoglobin: 11.1 — ABNORMAL LOW
Hemoglobin: 11.5 — ABNORMAL LOW
MCHC: 34
MCHC: 34.1
MCHC: 34.7
MCV: 84.9
MCV: 85.2
Platelets: 282
Platelets: 306
RBC: 3.65 — ABNORMAL LOW
RDW: 13.6
RDW: 13.8
RDW: 14.2

## 2010-10-30 LAB — PROTIME-INR
INR: 1.6 — ABNORMAL HIGH
INR: 1.9 — ABNORMAL HIGH
INR: 2.2 — ABNORMAL HIGH
Prothrombin Time: 19.5 — ABNORMAL HIGH

## 2010-10-31 LAB — DIFFERENTIAL
Basophils Relative: 0
Eosinophils Absolute: 0.1 — ABNORMAL LOW
Eosinophils Relative: 1
Lymphs Abs: 1.5
Monocytes Absolute: 0.5
Monocytes Relative: 5

## 2010-10-31 LAB — BASIC METABOLIC PANEL
CO2: 24
Chloride: 102
GFR calc Af Amer: >60
Potassium: 4.1

## 2010-10-31 LAB — CBC
HCT: 42
MCHC: 34.3
MCV: 83.1
RBC: 5.06
WBC: 9.7

## 2010-10-31 LAB — URINALYSIS, ROUTINE W REFLEX MICROSCOPIC
Bilirubin Urine: NEGATIVE
Glucose, UA: NEGATIVE
Hgb urine dipstick: NEGATIVE
Protein, ur: NEGATIVE
Urobilinogen, UA: 0.2

## 2010-10-31 LAB — URINE CULTURE: Culture: NO GROWTH

## 2010-10-31 LAB — TYPE AND SCREEN
ABO/RH(D): O POS
Antibody Screen: NEGATIVE

## 2010-10-31 LAB — ABO/RH: ABO/RH(D): O POS

## 2010-10-31 LAB — PROTIME-INR: INR: 1

## 2011-01-13 ENCOUNTER — Emergency Department (INDEPENDENT_AMBULATORY_CARE_PROVIDER_SITE_OTHER): Payer: Medicare Other

## 2011-01-13 ENCOUNTER — Emergency Department (HOSPITAL_COMMUNITY)
Admission: EM | Admit: 2011-01-13 | Discharge: 2011-01-13 | Disposition: A | Discharge status: Home / Self Care | Payer: Medicare Other | Source: Home / Self Care | Attending: Family Medicine | Admitting: Family Medicine

## 2011-01-13 DIAGNOSIS — J40 Bronchitis, not specified as acute or chronic: Secondary | ICD-10-CM

## 2011-01-13 HISTORY — DX: Essential (primary) hypertension: I10

## 2011-01-13 MED ORDER — ALBUTEROL SULFATE HFA 108 (90 BASE) MCG/ACT IN AERS: 2.0000 | INHALATION_SPRAY | RESPIRATORY_TRACT | Status: DC | PRN

## 2011-01-13 MED ORDER — HYDROCODONE-HOMATROPINE 5-1.5 MG/5ML PO SYRP: 5.0000 mL | ORAL_SOLUTION | Freq: Four times a day (QID) | ORAL | Status: AC | PRN

## 2011-01-13 NOTE — ED Notes (Signed)
Pt has had cough and chest congestion since Wednesday, saw her PCP on Wed and was given med and no improvement

## 2011-01-13 NOTE — ED Provider Notes (Signed)
History     CSN: [id number]  Arrival date & time 01/13/11  1151   First MD Initiated Contact with Patient 01/13/11 1154      Chief Complaint  Patient presents with  . Cough    (Consider location/radiation/quality/duration/timing/severity/associated sxs/prior treatment) HPI Comments: [name] presents for evaluation of persistent cough. She reports seeing her PCP several days ago and given cough syrup, and Tessalon Perles. She denies any persistent fever, but had one a few days ago. She quit smoking 8 years ago.   Patient is a 76 y.o. female presenting with cough. The history is provided by the patient.  Cough This is a new problem. The current episode started more than 2 days ago. The problem occurs constantly. The problem has not changed since onset.The cough is non-productive. The maximum temperature recorded prior to her arrival was 100 to 100.9 F. The fever has been present for less than 1 day. Associated symptoms include headaches, rhinorrhea and wheezing. Pertinent negatives include no chills, no ear pain, no sore throat and no shortness of breath. She has tried cough syrup for the symptoms. She is not a smoker.    Past Medical History  Diagnosis Date  . Hypertension     Past Surgical History  Procedure Date  . Abdominal hysterectomy   . Cholecystectomy     History reviewed. No pertinent family history.  History  Substance Use Topics  . Smoking status: Never Smoker   . Smokeless tobacco: Not on file  . Alcohol Use: No    OB History    Grav Para Term Preterm Abortions TAB SAB Ect Mult Living                  Review of Systems  Constitutional: Negative for fever and chills.  HENT: Positive for congestion and rhinorrhea. Negative for ear pain, sore throat and trouble swallowing.   Eyes: Negative.   Respiratory: Positive for cough and wheezing. Negative for shortness of breath.   Cardiovascular: Negative.   Gastrointestinal: Negative.   Genitourinary: Negative.    Skin: Negative.   Neurological: Positive for headaches.    Allergies  Review of patient's allergies indicates no known allergies.  Home Medications   Current Outpatient Rx  Name Route Sig Dispense Refill  . ALBUTEROL SULFATE HFA 108 (90 BASE) MCG/ACT IN AERS Inhalation Inhale 2 puffs into the lungs every 4 (four) hours as needed for wheezing or shortness of breath. 1 Inhaler 0  . HYDROCODONE-HOMATROPINE 5-1.5 MG/5ML PO SYRP Oral Take 5 mLs by mouth every 6 (six) hours as needed for cough. 120 mL 0    BP 125/61  Pulse 64  Temp(Src) 99.5 F (37.5 C) (Oral)  Resp 18  SpO2 95%  Physical Exam  Nursing note and vitals reviewed. Constitutional: She is oriented to person, place, and time. She appears well-developed and well-nourished.  HENT:  Head: Normocephalic and atraumatic.  Eyes: EOM are normal.  Neck: Normal range of motion.  Pulmonary/Chest: Effort normal. She has wheezes in the right lower field, the left upper field, the left middle field and the left lower field.  Musculoskeletal: Normal range of motion.  Neurological: She is alert and oriented to person, place, and time.  Skin: Skin is warm and dry.  Psychiatric: Her behavior is normal.    ED Course  Procedures (including critical care time)  Labs Reviewed - No data to display Dg Chest 2 View  01/13/2011  *RADIOLOGY REPORT*  Clinical Data: Cough, fever  CHEST - 2  VIEW  Comparison: Chest x-ray of 10/20/2008  Findings: No active infiltrate or effusion is seen.  Mediastinal contours appear stable.  The heart is within normal limits in size. No bony abnormality is seen.  IMPRESSION: Stable chest x-ray.  No active lung disease.  Original Report Authenticated By: Juline Patch, M.D.     1. Bronchitis       MDM  Given albuterol rx        Richardo Priest, MD 01/13/11 [phone]

## 2011-06-19 ENCOUNTER — Other Ambulatory Visit: Payer: Self-pay | Admitting: Family Medicine

## 2011-06-19 DIAGNOSIS — Z1231 Encounter for screening mammogram for malignant neoplasm of breast: Secondary | ICD-10-CM

## 2011-07-12 ENCOUNTER — Ambulatory Visit
Admission: RE | Admit: 2011-07-12 | Discharge: 2011-07-12 | Disposition: A | Discharge status: Home / Self Care | Payer: Medicare Other | Source: Ambulatory Visit | Attending: Family Medicine | Admitting: Family Medicine

## 2011-07-12 DIAGNOSIS — Z1231 Encounter for screening mammogram for malignant neoplasm of breast: Secondary | ICD-10-CM

## 2011-12-19 ENCOUNTER — Other Ambulatory Visit: Payer: Self-pay | Admitting: *Deleted

## 2011-12-19 DIAGNOSIS — I6529 Occlusion and stenosis of unspecified carotid artery: Secondary | ICD-10-CM

## 2011-12-19 DIAGNOSIS — Z48812 Encounter for surgical aftercare following surgery on the circulatory system: Secondary | ICD-10-CM

## 2012-01-28 ENCOUNTER — Other Ambulatory Visit: Payer: Self-pay | Admitting: Gastroenterology

## 2012-01-28 DIAGNOSIS — K529 Noninfective gastroenteritis and colitis, unspecified: Secondary | ICD-10-CM

## 2012-01-31 ENCOUNTER — Other Ambulatory Visit: Payer: Self-pay | Admitting: Gastroenterology

## 2012-01-31 DIAGNOSIS — IMO0002 Reserved for concepts with insufficient information to code with codable children: Secondary | ICD-10-CM

## 2012-01-31 DIAGNOSIS — K529 Noninfective gastroenteritis and colitis, unspecified: Secondary | ICD-10-CM

## 2012-02-04 ENCOUNTER — Encounter: Payer: Self-pay | Admitting: Vascular Surgery

## 2012-02-05 ENCOUNTER — Other Ambulatory Visit (INDEPENDENT_AMBULATORY_CARE_PROVIDER_SITE_OTHER): Payer: Medicare Other | Admitting: *Deleted

## 2012-02-05 ENCOUNTER — Encounter: Payer: Self-pay | Admitting: Vascular Surgery

## 2012-02-05 ENCOUNTER — Ambulatory Visit (INDEPENDENT_AMBULATORY_CARE_PROVIDER_SITE_OTHER): Payer: Medicare Other | Admitting: Vascular Surgery

## 2012-02-05 VITALS — BP 142/85 | HR 64 | Resp 20 | Ht 65.0 in | Wt 193.0 lb

## 2012-02-05 DIAGNOSIS — I6529 Occlusion and stenosis of unspecified carotid artery: Secondary | ICD-10-CM

## 2012-02-05 DIAGNOSIS — Z48812 Encounter for surgical aftercare following surgery on the circulatory system: Secondary | ICD-10-CM

## 2012-02-05 NOTE — Progress Notes (Signed)
Subjective:     Patient ID: [name], female   DOB: 18-Jul-1934, 78 y.o.   MRN: [id number]  HPI this 78 year old female returns for continued followup regarding her carotid occlusive disease. She underwent left carotid endarterectomy by me in 2010 for severe asymptomatic stenosis. She continues to deny any neurologic symptoms such as lateralizing weakness, aphasia, amaurosis fugax, diplopia, blurred vision, or syncope. She has not had a followup duplex scan 07/11/2009 in our office revealed some mild left internal carotid recurrent stenosis.  Past Medical History  Diagnosis Date  . Hypertension   . Colitis   . Arthritis   . Carotid artery occlusion     History  Substance Use Topics  . Smoking status: Former Smoker -- 50 years    Types: Cigarettes    Quit date: 02/04/2002  . Smokeless tobacco: Never Used  . Alcohol Use: No    Family History  Problem Relation Age of Onset  . Heart disease Mother     Allergies  Allergen Reactions  . Amoxicillin   . Tetracyclines & Related     Current outpatient prescriptions:aspirin 81 MG tablet, Take 81 mg by mouth daily., Disp: , Rfl: ;  atenolol (TENORMIN) 100 MG tablet, Take 100 mg by mouth daily.  , Disp: , Rfl: ;  benzonatate (TESSALON) 100 MG capsule, Take 100 mg by mouth 3 (three) times daily as needed.  , Disp: , Rfl: ;  LORazepam (ATIVAN) 1 MG tablet, Take 1 mg by mouth every 8 (eight) hours.  , Disp: , Rfl:  losartan (COZAAR) 100 MG tablet, Take 100 mg by mouth daily., Disp: , Rfl: ;  omeprazole (PRILOSEC) 40 MG capsule, Take 40 mg by mouth daily., Disp: , Rfl: ;  PREDNISONE PO, Take 15 mg by mouth daily., Disp: , Rfl: ;  sertraline (ZOLOFT) 50 MG tablet, Take 50 mg by mouth daily.  , Disp: , Rfl:  albuterol (PROVENTIL HFA;VENTOLIN HFA) 108 (90 BASE) MCG/ACT inhaler, Inhale 2 puffs into the lungs every 4 (four) hours as needed for wheezing or shortness of breath., Disp: 1 Inhaler, Rfl: 0;  amLODipine (NORVASC) 5 MG tablet, Take 5 mg by  mouth daily.  , Disp: , Rfl: ;  HYDROcodone-acetaminophen (NORCO) 10-325 MG per tablet, Take 1 tablet by mouth every 6 (six) hours as needed.  , Disp: , Rfl:   BP 142/85  Pulse 64  Resp 20  Ht 5\' 5"  (1.651 m)  Wt 193 lb (87.544 kg)  BMI 32.12 kg/m2  Body mass index is 32.12 kg/(m^2).           Review of Systems denies chest pain, dyspnea on exertion, PND, orthopnea, hemoptysis, claudication. Does have pain in right knee from previous knee replacement. Occasional dizziness. Denies other symptoms in the complete review of systems     Objective:   Physical Exam blood pressure 142 rate 5 heart rate 60 respirations 20 Gen.-alert and oriented x3 in no apparent distress HEENT normal for age Lungs no rhonchi or wheezing Cardiovascular regular rhythm no murmurs carotid pulses 3+ palpable no bruits audible Abdomen soft nontender no palpable masses Musculoskeletal free of  major deformities Skin clear -no rashes Neurologic normal Lower extremities 3+ femoral and dorsalis pedis pulses palpable bilaterally with no edema  Today I ordered a carotid duplex exam which I reviewed and interpreted-the left common and proximal internal carotid artery appeared to have a moderate restenosis in the range of 70%. The highest velocity is 250 cm/s. The images suggest that the  stenosis may be more severe. She has mild right ICA flow reduction with a severe right ECA flow reduction  Assessment:     3 a half years post left carotid endarterectomy for asymptomatic severe stenosis-evidence of moderate restenosis on left-asymptomatic    Plan:     Return in 6 months for repeat carotid duplex exam. If patient develops any neurologic symptoms which I reviewed with her she will notify us at that time Continue daily aspirin

## 2012-02-06 NOTE — Addendum Note (Signed)
Addended by: Dannielle Karvonen on: 02/06/2012 03:53 PM   Modules accepted: Orders

## 2012-02-14 ENCOUNTER — Other Ambulatory Visit: Payer: Medicare Other

## 2012-02-18 ENCOUNTER — Other Ambulatory Visit: Payer: Medicare Other

## 2012-02-21 ENCOUNTER — Ambulatory Visit
Admission: RE | Admit: 2012-02-21 | Discharge: 2012-02-21 | Disposition: A | Discharge status: Home / Self Care | Payer: Medicare Other | Source: Ambulatory Visit | Attending: Gastroenterology | Admitting: Gastroenterology

## 2012-02-21 DIAGNOSIS — IMO0002 Reserved for concepts with insufficient information to code with codable children: Secondary | ICD-10-CM

## 2012-02-21 DIAGNOSIS — K529 Noninfective gastroenteritis and colitis, unspecified: Secondary | ICD-10-CM

## 2012-06-12 ENCOUNTER — Other Ambulatory Visit: Payer: Self-pay

## 2012-06-12 DIAGNOSIS — Z1231 Encounter for screening mammogram for malignant neoplasm of breast: Secondary | ICD-10-CM

## 2012-07-17 ENCOUNTER — Ambulatory Visit
Admission: RE | Admit: 2012-07-17 | Discharge: 2012-07-17 | Disposition: A | Discharge status: Home / Self Care | Payer: Medicare Other | Source: Ambulatory Visit

## 2012-07-17 DIAGNOSIS — Z1231 Encounter for screening mammogram for malignant neoplasm of breast: Secondary | ICD-10-CM

## 2012-07-21 ENCOUNTER — Other Ambulatory Visit: Payer: Self-pay | Admitting: Family Medicine

## 2012-07-21 DIAGNOSIS — R928 Other abnormal and inconclusive findings on diagnostic imaging of breast: Secondary | ICD-10-CM

## 2012-07-29 ENCOUNTER — Ambulatory Visit
Admission: RE | Admit: 2012-07-29 | Discharge: 2012-07-29 | Disposition: A | Discharge status: Home / Self Care | Payer: Medicare Other | Source: Ambulatory Visit | Attending: Family Medicine | Admitting: Family Medicine

## 2012-07-29 DIAGNOSIS — R928 Other abnormal and inconclusive findings on diagnostic imaging of breast: Secondary | ICD-10-CM

## 2012-08-11 ENCOUNTER — Encounter: Payer: Self-pay | Admitting: Vascular Surgery

## 2012-08-12 ENCOUNTER — Encounter: Payer: Self-pay | Admitting: Vascular Surgery

## 2012-08-12 ENCOUNTER — Ambulatory Visit (INDEPENDENT_AMBULATORY_CARE_PROVIDER_SITE_OTHER): Payer: Medicare Other | Admitting: Vascular Surgery

## 2012-08-12 ENCOUNTER — Other Ambulatory Visit (INDEPENDENT_AMBULATORY_CARE_PROVIDER_SITE_OTHER): Payer: Medicare Other | Admitting: Vascular Surgery

## 2012-08-12 DIAGNOSIS — I6529 Occlusion and stenosis of unspecified carotid artery: Secondary | ICD-10-CM

## 2012-08-12 DIAGNOSIS — Z48812 Encounter for surgical aftercare following surgery on the circulatory system: Secondary | ICD-10-CM

## 2012-08-12 NOTE — Addendum Note (Signed)
Addended by: Adria Dill L on: 08/12/2012 03:38 PM   Modules accepted: Orders

## 2012-08-12 NOTE — Progress Notes (Signed)
Subjective:     Patient ID: [name], female   DOB: Oct 03, 1934, 78 y.o.   MRN: [id number]  HPI this 78 year old female status post left carotid endarterectomy by me 10/20/2008 for severe asymptomatic left ICA stenosis. She continues to be free of symptoms including lateralizing weakness, aphasia, amaurosis fugax, diplopia, and syncope. She was found at her last visit to have evidence of intimal hyperplasia causing a recurrent stenosis in left ICA approximately 60%. She returns today for continued followup. She continues to take daily aspirin.  Past Medical History  Diagnosis Date  . Hypertension   . Colitis   . Arthritis   . Carotid artery occlusion   . COPD (chronic obstructive pulmonary disease)     History  Substance Use Topics  . Smoking status: Former Smoker -- 50 years    Types: Cigarettes    Quit date: 02/04/2002  . Smokeless tobacco: Never Used  . Alcohol Use: No    Family History  Problem Relation Age of Onset  . Heart disease Mother     Allergies  Allergen Reactions  . Amoxicillin   . Tetracyclines & Related     Current outpatient prescriptions:amLODipine (NORVASC) 5 MG tablet, Take 5 mg by mouth daily.  , Disp: , Rfl: ;  aspirin 81 MG tablet, Take 81 mg by mouth daily., Disp: , Rfl: ;  atenolol (TENORMIN) 100 MG tablet, Take 100 mg by mouth daily.  , Disp: , Rfl: ;  LORazepam (ATIVAN) 1 MG tablet, Take 1 mg by mouth every 8 (eight) hours.  , Disp: , Rfl: ;  losartan (COZAAR) 100 MG tablet, Take 100 mg by mouth daily., Disp: , Rfl:  Multiple Vitamin (MULTIVITAMIN) tablet, Take 1 tablet by mouth daily., Disp: , Rfl: ;  Omega-3 Fatty Acids (FISH OIL PO), Take 1 tablet by mouth daily., Disp: , Rfl: ;  omeprazole (PRILOSEC) 40 MG capsule, Take 40 mg by mouth daily., Disp: , Rfl: ;  PREDNISONE PO, Take 15 mg by mouth daily., Disp: , Rfl: ;  sertraline (ZOLOFT) 50 MG tablet, Take 50 mg by mouth daily.  , Disp: , Rfl:  albuterol (PROVENTIL HFA;VENTOLIN HFA) 108 (90 BASE)  MCG/ACT inhaler, Inhale 2 puffs into the lungs every 4 (four) hours as needed for wheezing or shortness of breath., Disp: 1 Inhaler, Rfl: 0;  benzonatate (TESSALON) 100 MG capsule, Take 100 mg by mouth 3 (three) times daily as needed.  , Disp: , Rfl: ;  HYDROcodone-acetaminophen (NORCO) 10-325 MG per tablet, Take 1 tablet by mouth every 6 (six) hours as needed.  , Disp: , Rfl:   BP 137/59  Pulse 58  Ht 5\' 5"  (1.651 m)  Wt 184 lb 14.4 oz (83.87 kg)  BMI 30.77 kg/m2  SpO2 99%  Body mass index is 30.77 kg/(m^2).           Review of Systems denies chest pain, orthopnea, PND, productive cough. Does complain of dizziness, rashes, and dyspnea on exertion. Other systems are negative other than severe right knee pain where she had a previous right knee replacement     Objective:   Physical Exam blood pressure 137/59 heart rate 58 respirations 16 Gen.-alert and oriented x3 in no apparent distress HEENT normal for age Lungs no rhonchi or wheezing Cardiovascular regular rhythm no murmurs carotid pulses 3+ palpable no bruits audible Abdomen soft nontender no palpable masses Musculoskeletal free of  major deformities Skin clear -no rashes Neurologic normal Lower extremities 3+ femoral and dorsalis pedis pulses palpable  bilaterally with no edema  Data ordered a carotid duplex exam which I reviewed and interpreted. There is evidence of recurrent left ICA stenosis with peak systolic velocity of 294/47. This is similar to the study 6 months ago in the 60-70% range. She also has a severe right ECA stenosis but no significant ICA stenosis on the right    Assessment:     Evidence of intimal hyperplasia and left carotid endarterectomy site performed in 2010 with approximate 60-70% stenosis-asymptomatic    Plan:     Return in 6 months for followup carotid duplex exam unless patient develops neurologic symptoms in the interim Continue daily aspirin

## 2012-08-27 ENCOUNTER — Other Ambulatory Visit: Payer: Self-pay

## 2012-11-27 ENCOUNTER — Other Ambulatory Visit: Payer: Self-pay

## 2013-02-16 ENCOUNTER — Encounter: Payer: Self-pay | Admitting: Vascular Surgery

## 2013-02-17 ENCOUNTER — Ambulatory Visit (HOSPITAL_COMMUNITY)
Admission: RE | Admit: 2013-02-17 | Discharge: 2013-02-17 | Disposition: A | Discharge status: Home / Self Care | Payer: Medicare Other | Source: Ambulatory Visit | Attending: Vascular Surgery | Admitting: Vascular Surgery

## 2013-02-17 ENCOUNTER — Encounter: Payer: Self-pay | Admitting: Vascular Surgery

## 2013-02-17 ENCOUNTER — Ambulatory Visit (INDEPENDENT_AMBULATORY_CARE_PROVIDER_SITE_OTHER): Payer: Medicare Other | Admitting: Vascular Surgery

## 2013-02-17 VITALS — BP 122/75 | HR 60 | Resp 18 | Ht 65.0 in | Wt 176.0 lb

## 2013-02-17 DIAGNOSIS — I6529 Occlusion and stenosis of unspecified carotid artery: Secondary | ICD-10-CM

## 2013-02-17 DIAGNOSIS — Z48812 Encounter for surgical aftercare following surgery on the circulatory system: Secondary | ICD-10-CM

## 2013-02-17 DIAGNOSIS — I658 Occlusion and stenosis of other precerebral arteries: Secondary | ICD-10-CM | POA: Insufficient documentation

## 2013-02-17 NOTE — Progress Notes (Signed)
Subjective:     Patient ID: [name], female   DOB: 1934-05-12, 79 y.o.   MRN: [id number]  HPI this 79 year old female returns for continued followup regarding her carotid occlusive disease. She underwent left carotid endarterectomy by me in 2010. She is asymptomatic. She has evidence of some intimal hyperplasia at the left carotid endarterectomy site causing a 50-60% recurrent stenosis which remains asymptomatic. She denies lateralizing weakness, aphasia, amaurosis fugax, diplopia, blurred vision, or syncope.  Past Medical History  Diagnosis Date  . Hypertension   . Colitis   . Arthritis   . Carotid artery occlusion   . COPD (chronic obstructive pulmonary disease)     History  Substance Use Topics  . Smoking status: Former Smoker -- 50 years    Types: Cigarettes    Quit date: 02/04/2002  . Smokeless tobacco: Never Used  . Alcohol Use: No    Family History  Problem Relation Age of Onset  . Heart disease Mother     Allergies  Allergen Reactions  . Amoxicillin   . Tetracyclines & Related     Current outpatient prescriptions:amLODipine (NORVASC) 5 MG tablet, Take 5 mg by mouth daily.  , Disp: , Rfl: ;  aspirin 81 MG tablet, Take 81 mg by mouth daily., Disp: , Rfl: ;  atenolol (TENORMIN) 100 MG tablet, Take 100 mg by mouth daily.  , Disp: , Rfl: ;  benzonatate (TESSALON) 100 MG capsule, Take 100 mg by mouth 3 (three) times daily as needed.  , Disp: , Rfl:  LORazepam (ATIVAN) 1 MG tablet, Take 1 mg by mouth every 8 (eight) hours.  , Disp: , Rfl: ;  losartan (COZAAR) 100 MG tablet, Take 100 mg by mouth daily., Disp: , Rfl: ;  Multiple Vitamin (MULTIVITAMIN) tablet, Take 1 tablet by mouth daily., Disp: , Rfl: ;  Omega-3 Fatty Acids (FISH OIL PO), Take 1 tablet by mouth daily., Disp: , Rfl: ;  omeprazole (PRILOSEC) 40 MG capsule, Take 40 mg by mouth daily., Disp: , Rfl:  PREDNISONE PO, Take 20 mg by mouth daily. , Disp: , Rfl: ;  sertraline (ZOLOFT) 50 MG tablet, Take 50 mg by mouth  daily.  , Disp: , Rfl: ;  albuterol (PROVENTIL HFA;VENTOLIN HFA) 108 (90 BASE) MCG/ACT inhaler, Inhale 2 puffs into the lungs every 4 (four) hours as needed for wheezing or shortness of breath., Disp: 1 Inhaler, Rfl: 0 HYDROcodone-acetaminophen (NORCO) 10-325 MG per tablet, Take 1 tablet by mouth every 6 (six) hours as needed.  , Disp: , Rfl:   BP 122/75  Pulse 60  Resp 18  Ht 5\' 5"  (1.651 m)  Wt 176 lb (79.833 kg)  BMI 29.29 kg/m2  Body mass index is 29.29 kg/(m^2).          Review of Systems denies chest pain, dyspnea on exertion, PND, orthopnea, hemoptysis, claudication, does complain of occasional skin rashes. Other systems negative and complete review of systems    Objective:   Physical Exam BP 122/75  Pulse 60  Resp 18  Ht 5\' 5"  (1.651 m)  Wt 176 lb (79.833 kg)  BMI 29.29 kg/m2  Gen.-alert and oriented x3 in no apparent distress HEENT normal for age Lungs no rhonchi or wheezing Cardiovascular regular rhythm no murmurs carotid pulses 3+ palpable-bilateral carotid bruits Abdomen soft nontender no palpable masses Musculoskeletal free of  major deformities Skin clear -no rashes Neurologic normal Lower extremities 3+ femoral and dorsalis pedis pulses palpable bilaterally with no edema  Today I ordered  carotid duplex exam which I reviewed and interpreted. Patient continues to have a moderate to moderately severe left ICA stenosis with peak systolic velocity up to 346 cm/s at end-diastolic velocity is only 41 cm/s. She also has bilateral severe external carotid stenoses. Right ICA is widely patent.        Assessment:     Recurrent left ICA stenosis 60-70%-asymptomatic probably due to intimal hyperplasia-    Plan:     Return in 6 months for repeat carotid duplex exam unless patient develops neurologic symptoms in the interim Continue daily aspirin

## 2013-02-18 ENCOUNTER — Other Ambulatory Visit: Payer: Self-pay | Admitting: *Deleted

## 2013-02-18 DIAGNOSIS — I6529 Occlusion and stenosis of unspecified carotid artery: Secondary | ICD-10-CM

## 2013-08-17 ENCOUNTER — Encounter: Payer: Self-pay | Admitting: Vascular Surgery

## 2013-08-18 ENCOUNTER — Encounter: Payer: Self-pay | Admitting: Vascular Surgery

## 2013-08-18 ENCOUNTER — Ambulatory Visit (INDEPENDENT_AMBULATORY_CARE_PROVIDER_SITE_OTHER): Payer: Medicare Other | Admitting: Vascular Surgery

## 2013-08-18 ENCOUNTER — Ambulatory Visit (HOSPITAL_COMMUNITY)
Admission: RE | Admit: 2013-08-18 | Discharge: 2013-08-18 | Disposition: A | Discharge status: Home / Self Care | Payer: Medicare Other | Source: Ambulatory Visit | Attending: Vascular Surgery | Admitting: Vascular Surgery

## 2013-08-18 VITALS — BP 128/73 | HR 59 | Resp 16 | Ht 66.0 in | Wt 180.0 lb

## 2013-08-18 DIAGNOSIS — I6529 Occlusion and stenosis of unspecified carotid artery: Secondary | ICD-10-CM | POA: Diagnosis present

## 2013-08-18 DIAGNOSIS — Z48812 Encounter for surgical aftercare following surgery on the circulatory system: Secondary | ICD-10-CM

## 2013-08-18 NOTE — Addendum Note (Signed)
Addended by: Sharee PimpleMCCHESNEY, MARILYN K on: 08/18/2013 12:49 PM   Modules accepted: Orders

## 2013-08-18 NOTE — Progress Notes (Signed)
Subjective:     Patient ID: [name], female   DOB: 03-Aug-1934, 79 y.o.   MRN: [id number]  HPI this 79 year old female returns for continued followup regarding her carotid occlusive disease. She had left carotid endarterectomy performed by me in 2010. She did have evidence of some mild intimal hyperplasia at her last visit 6 months ago. She denies any neurologic symptoms such as lateralizing weakness, aphasia, amaurosis fugax, diplopia, blurred vision, or syncope. She has no claudication symptoms and ambulates well. Has mild dyspnea on exertion.  Past Medical History  Diagnosis Date  . Hypertension   . Colitis   . Arthritis   . Carotid artery occlusion   . COPD (chronic obstructive pulmonary disease)     History  Substance Use Topics  . Smoking status: Former Smoker -- 50 years    Types: Cigarettes    Quit date: 02/04/2002  . Smokeless tobacco: Never Used  . Alcohol Use: No    Family History  Problem Relation Age of Onset  . Heart disease Mother     Allergies  Allergen Reactions  . Amoxicillin   . Tetracyclines & Related     Current outpatient prescriptions:amLODipine (NORVASC) 5 MG tablet, Take 5 mg by mouth daily.  , Disp: , Rfl: ;  aspirin 81 MG tablet, Take 81 mg by mouth daily., Disp: , Rfl: ;  atenolol (TENORMIN) 100 MG tablet, Take 100 mg by mouth daily.  , Disp: , Rfl: ;  Calcium Carbonate (CALTRATE 600 PO), Take 600 mg by mouth daily., Disp: , Rfl: ;  LORazepam (ATIVAN) 1 MG tablet, Take 1 mg by mouth every 8 (eight) hours.  , Disp: , Rfl:  losartan (COZAAR) 100 MG tablet, Take 100 mg by mouth daily., Disp: , Rfl: ;  omeprazole (PRILOSEC) 40 MG capsule, Take 40 mg by mouth daily., Disp: , Rfl: ;  PREDNISONE PO, Take 20 mg by mouth daily. , Disp: , Rfl: ;  sertraline (ZOLOFT) 50 MG tablet, Take 50 mg by mouth daily.  , Disp: , Rfl:  albuterol (PROVENTIL HFA;VENTOLIN HFA) 108 (90 BASE) MCG/ACT inhaler, Inhale 2 puffs into the lungs every 4 (four) hours as needed for  wheezing or shortness of breath., Disp: 1 Inhaler, Rfl: 0;  benzonatate (TESSALON) 100 MG capsule, Take 100 mg by mouth 3 (three) times daily as needed.  , Disp: , Rfl: ;  HYDROcodone-acetaminophen (NORCO) 10-325 MG per tablet, Take 1 tablet by mouth every 6 (six) hours as needed.  , Disp: , Rfl:  Multiple Vitamin (MULTIVITAMIN) tablet, Take 1 tablet by mouth daily., Disp: , Rfl: ;  Omega-3 Fatty Acids (FISH OIL PO), Take 1 tablet by mouth daily., Disp: , Rfl:   BP 128/73  Pulse 59  Resp 16  Ht 5\' 6"  (1.676 m)  Wt 180 lb (81.647 kg)  BMI 29.07 kg/m2  Body mass index is 29.07 kg/(m^2).           Review of Systems does have dyspnea on exertion and occasional wheezing. Occasional dizziness also. Denies chest pain, hemoptysis, all other systems negative and a complete review of systems    Objective:   Physical Exam BP 128/73  Pulse 59  Resp 16  Ht 5\' 6"  (1.676 m)  Wt 180 lb (81.647 kg)  BMI 29.07 kg/m2  Gen.-alert and oriented x3 in no apparent distress HEENT normal for age Lungs no rhonchi or wheezing Cardiovascular regular rhythm no murmurs carotid pulses 3+ palpable no bruits audible Abdomen soft nontender no palpable  masses Musculoskeletal free of  major deformities Skin clear -no rashes Neurologic normal Lower extremities 3+ femoral and dorsalis pedis pulses palpable bilaterally with no edema  Today on her carotid duplex exam which I reviewed and interpreted. There is very mild stenosis of the left carotid endarterectomy slight-approximately 40% and some stenosis in the distal left common carotid artery. Right internal carotid has mild stenosis. There is no change in the study over the past 6 months.     Assessment:     Status post left carotid endarterectomy with mild intimal hyperplasia of left carotid endarterectomy site    Plan:     Return in 12 months for repeat carotid duplex exam Continue daily aspirin

## 2014-01-16 ENCOUNTER — Emergency Department (HOSPITAL_COMMUNITY)
Admission: EM | Admit: 2014-01-16 | Discharge: 2014-01-16 | Disposition: A | Discharge status: Home / Self Care | Payer: Medicare Other | Source: Home / Self Care | Attending: Emergency Medicine | Admitting: Emergency Medicine

## 2014-01-16 ENCOUNTER — Encounter (HOSPITAL_COMMUNITY): Payer: Self-pay | Admitting: *Deleted

## 2014-01-16 DIAGNOSIS — J00 Acute nasopharyngitis [common cold]: Secondary | ICD-10-CM

## 2014-01-16 MED ORDER — IPRATROPIUM BROMIDE 0.06 % NA SOLN: 2.0000 | Freq: Four times a day (QID) | NASAL | Status: DC

## 2014-01-16 NOTE — ED Provider Notes (Signed)
CSN: [id number]     Arrival date & time 01/16/14  1137 History   First MD Initiated Contact with Patient 01/16/14 1251     Chief Complaint  Patient presents with  . URI   (Consider location/radiation/quality/duration/timing/severity/associated sxs/prior Treatment) HPI Comments: Nonsmoker States what bothers her most is her nasal congestion  Patient is a 79 y.o. female presenting with URI. The history is provided by the patient.  URI Presenting symptoms: congestion, cough, rhinorrhea and sore throat   Presenting symptoms: no ear pain, no facial pain, no fatigue and no fever   Severity:  Mild Onset quality:  Gradual Duration:  5 days Timing:  Constant Progression:  Improving Chronicity:  New Associated symptoms: headaches and sneezing   Associated symptoms: no arthralgias, no myalgias, no neck pain, no sinus pain, no swollen glands and no wheezing     Past Medical History  Diagnosis Date  . Hypertension   . Colitis   . Arthritis   . Carotid artery occlusion   . COPD (chronic obstructive pulmonary disease)    Past Surgical History  Procedure Laterality Date  . Abdominal hysterectomy    . Cholecystectomy    . Joint replacement      RIGHT KNEE  . Carotid endarterectomy     Family History  Problem Relation Age of Onset  . Heart disease Mother    History  Substance Use Topics  . Smoking status: Former Smoker -- 50 years    Types: Cigarettes    Quit date: 02/04/2002  . Smokeless tobacco: Never Used  . Alcohol Use: No   OB History    No data available     Review of Systems  Constitutional: Negative for fever and fatigue.  HENT: Positive for congestion, rhinorrhea, sneezing and sore throat. Negative for ear pain.   Respiratory: Positive for cough. Negative for wheezing.   Musculoskeletal: Negative for myalgias, arthralgias and neck pain.  Neurological: Positive for headaches.  All other systems reviewed and are negative.   Allergies  Amoxicillin and  Tetracyclines & related  Home Medications   Prior to Admission medications   Medication Sig Start Date End Date Taking? Authorizing Provider  albuterol (PROVENTIL HFA;VENTOLIN HFA) 108 (90 BASE) MCG/ACT inhaler Inhale 2 puffs into the lungs every 4 (four) hours as needed for wheezing or shortness of breath. 01/13/11 01/13/12  Delanna Noticeonald Laney, MD  amLODipine (NORVASC) 5 MG tablet Take 5 mg by mouth daily.      Historical Provider, MD  aspirin 81 MG tablet Take 81 mg by mouth daily.    Historical Provider, MD  atenolol (TENORMIN) 100 MG tablet Take 100 mg by mouth daily.      Historical Provider, MD  benzonatate (TESSALON) 100 MG capsule Take 100 mg by mouth 3 (three) times daily as needed.      Historical Provider, MD  Calcium Carbonate (CALTRATE 600 PO) Take 600 mg by mouth daily.    Historical Provider, MD  HYDROcodone-acetaminophen (NORCO) 10-325 MG per tablet Take 1 tablet by mouth every 6 (six) hours as needed.      Historical Provider, MD  ipratropium (ATROVENT) 0.06 % nasal spray Place 2 sprays into both nostrils 4 (four) times daily. For nasal congestion 01/16/14   Mathis FareJennifer Lee H Consuelo Suthers, PA  LORazepam (ATIVAN) 1 MG tablet Take 1 mg by mouth every 8 (eight) hours.      Historical Provider, MD  losartan (COZAAR) 100 MG tablet Take 100 mg by mouth daily.    Historical Provider, MD  Multiple Vitamin (MULTIVITAMIN) tablet Take 1 tablet by mouth daily.    Historical Provider, MD  Omega-3 Fatty Acids (FISH OIL PO) Take 1 tablet by mouth daily.    Historical Provider, MD  omeprazole (PRILOSEC) 40 MG capsule Take 40 mg by mouth daily.    Historical Provider, MD  PREDNISONE PO Take 20 mg by mouth daily.     Historical Provider, MD  sertraline (ZOLOFT) 50 MG tablet Take 50 mg by mouth daily.      Historical Provider, MD   BP 162/93 mmHg  Pulse 71  Temp(Src) 98.6 F (37 C) (Oral)  Resp 16  SpO2 95% Physical Exam  Constitutional: She is oriented to person, place, and time. She appears  well-developed and well-nourished. No distress.  HENT:  Head: Normocephalic and atraumatic.  Right Ear: Hearing, external ear and ear canal normal. Tympanic membrane is retracted. A middle ear effusion is present.  Left Ear: External ear and ear canal normal. Tympanic membrane is retracted. A middle ear effusion is present.  Nose: Nose normal.  Mouth/Throat: Uvula is midline, oropharynx is clear and moist and mucous membranes are normal.  Eyes: Conjunctivae are normal. No scleral icterus.  Neck: Normal range of motion. Neck supple.  Cardiovascular: Normal rate, regular rhythm and normal heart sounds.   Pulmonary/Chest: Effort normal and breath sounds normal.  Lymphadenopathy:    She has no cervical adenopathy.  Neurological: She is alert and oriented to person, place, and time.  Skin: Skin is warm and dry. No rash noted. No erythema.  Psychiatric: She has a normal mood and affect. Her behavior is normal.  Nursing note and vitals reviewed.   ED Course  Procedures (including critical care time) Labs Review Labs Reviewed - No data to display  Imaging Review No results found.   MDM   1. Common cold   Symptomatic care at home with additional of Atrovent nasal spray and PCP follow up if no improvement.     Ria ClockJennifer Lee H Orrin Yurkovich, [place] 01/16/14 [phone]

## 2014-01-16 NOTE — Discharge Instructions (Signed)
Upper Respiratory Infection, Adult An upper respiratory infection (URI) is also sometimes known as the common cold. The upper respiratory tract includes the nose, sinuses, throat, trachea, and bronchi. Bronchi are the airways leading to the lungs. Most people improve within 1 week, but symptoms can last up to 2 weeks. A residual cough may last even longer.  CAUSES Many different viruses can infect the tissues lining the upper respiratory tract. The tissues become irritated and inflamed and often become very moist. Mucus production is also common. A cold is contagious. You can easily spread the virus to others by oral contact. This includes kissing, sharing a glass, coughing, or sneezing. Touching your mouth or nose and then touching a surface, which is then touched by another person, can also spread the virus. SYMPTOMS  Symptoms typically develop 1 to 3 days after you come in contact with a cold virus. Symptoms vary from person to person. They may include:  Runny nose.  Sneezing.  Nasal congestion.  Sinus irritation.  Sore throat.  Loss of voice (laryngitis).  Cough.  Fatigue.  Muscle aches.  Loss of appetite.  Headache.  Low-grade fever. DIAGNOSIS  You might diagnose your own cold based on familiar symptoms, since most people get a cold 2 to 3 times a year. Your caregiver can confirm this based on your exam. Most importantly, your caregiver can check that your symptoms are not due to another disease such as strep throat, sinusitis, pneumonia, asthma, or epiglottitis. Blood tests, throat tests, and X-rays are not necessary to diagnose a common cold, but they may sometimes be helpful in excluding other more serious diseases. Your caregiver will decide if any further tests are required. RISKS AND COMPLICATIONS  You may be at risk for a more severe case of the common cold if you smoke cigarettes, have chronic heart disease (such as heart failure) or lung disease (such as asthma), or if  you have a weakened immune system. The very young and very old are also at risk for more serious infections. Bacterial sinusitis, middle ear infections, and bacterial pneumonia can complicate the common cold. The common cold can worsen asthma and chronic obstructive pulmonary disease (COPD). Sometimes, these complications can require emergency medical care and may be life-threatening. PREVENTION  The best way to protect against getting a cold is to practice good hygiene. Avoid oral or hand contact with people with cold symptoms. Wash your hands often if contact occurs. There is no clear evidence that vitamin C, vitamin E, echinacea, or exercise reduces the chance of developing a cold. However, it is always recommended to get plenty of rest and practice good nutrition. TREATMENT  Treatment is directed at relieving symptoms. There is no cure. Antibiotics are not effective, because the infection is caused by a virus, not by bacteria. Treatment may include:  Increased fluid intake. Sports drinks offer valuable electrolytes, sugars, and fluids.  Breathing heated mist or steam (vaporizer or shower).  Eating chicken soup or other clear broths, and maintaining good nutrition.  Getting plenty of rest.  Using gargles or lozenges for comfort.  Controlling fevers with ibuprofen or acetaminophen as directed by your caregiver.  Increasing usage of your inhaler if you have asthma. Zinc gel and zinc lozenges, taken in the first 24 hours of the common cold, can shorten the duration and lessen the severity of symptoms. Pain medicines may help with fever, muscle aches, and throat pain. A variety of non-prescription medicines are available to treat congestion and runny nose. Your caregiver   can make recommendations and may suggest nasal or lung inhalers for other symptoms.  HOME CARE INSTRUCTIONS   Only take over-the-counter or prescription medicines for pain, discomfort, or fever as directed by your  caregiver.  Use a warm mist humidifier or inhale steam from a shower to increase air moisture. This may keep secretions moist and make it easier to breathe.  Drink enough water and fluids to keep your urine clear or pale yellow.  Rest as needed.  Return to work when your temperature has returned to normal or as your caregiver advises. You may need to stay home longer to avoid infecting others. You can also use a face mask and careful hand washing to prevent spread of the virus. SEEK MEDICAL CARE IF:   After the first few days, you feel you are getting worse rather than better.  You need your caregiver's advice about medicines to control symptoms.  You develop chills, worsening shortness of breath, or brown or red sputum. These may be signs of pneumonia.  You develop yellow or brown nasal discharge or pain in the face, especially when you bend forward. These may be signs of sinusitis.  You develop a fever, swollen neck glands, pain with swallowing, or white areas in the back of your throat. These may be signs of strep throat. SEEK IMMEDIATE MEDICAL CARE IF:   You have a fever.  You develop severe or persistent headache, ear pain, sinus pain, or chest pain.  You develop wheezing, a prolonged cough, cough up blood, or have a change in your usual mucus (if you have chronic lung disease).  You develop sore muscles or a stiff neck. Document Released: 07/04/2000 Document Revised: 04/02/2011 Document Reviewed: 04/15/2013 [employer] Patient Information 2015 [employer]. This information is not intended to replace advice given to you by your health care provider. Make sure you discuss any questions you have with your health care provider.  

## 2014-01-16 NOTE — ED Notes (Signed)
Pt  Reports   Symptoms of  Sinus   Congestion       Headache      -    With     Symptoms  X      6  Days       Pt  Reports   Symptoms  Not  releived  By  OTC  meds      Pt   Reports  Some  Cough/ congestion         She     Is sitting  Upright     On   Exam table   Speaking  In  Complete  sentances

## 2014-06-23 HISTORY — PX: EYE SURGERY: SHX253

## 2014-08-19 ENCOUNTER — Encounter: Payer: Self-pay | Admitting: Family

## 2014-08-24 ENCOUNTER — Encounter (HOSPITAL_COMMUNITY): Payer: Self-pay

## 2014-08-24 ENCOUNTER — Ambulatory Visit: Payer: Medicare Other | Admitting: Family

## 2014-09-15 ENCOUNTER — Encounter: Payer: Self-pay | Admitting: Family

## 2014-09-16 ENCOUNTER — Encounter: Payer: Self-pay | Admitting: Family

## 2014-09-16 ENCOUNTER — Ambulatory Visit (HOSPITAL_COMMUNITY)
Admission: RE | Admit: 2014-09-16 | Discharge: 2014-09-16 | Disposition: A | Discharge status: Home / Self Care | Payer: Medicare Other | Source: Ambulatory Visit | Attending: Family | Admitting: Family

## 2014-09-16 ENCOUNTER — Ambulatory Visit (INDEPENDENT_AMBULATORY_CARE_PROVIDER_SITE_OTHER): Payer: Medicare Other | Admitting: Family

## 2014-09-16 VITALS — BP 154/78 | HR 60 | Temp 97.4°F | Resp 16 | Ht 65.0 in | Wt 179.0 lb

## 2014-09-16 DIAGNOSIS — I10 Essential (primary) hypertension: Secondary | ICD-10-CM | POA: Diagnosis not present

## 2014-09-16 DIAGNOSIS — Z9889 Other specified postprocedural states: Secondary | ICD-10-CM

## 2014-09-16 DIAGNOSIS — Z87891 Personal history of nicotine dependence: Secondary | ICD-10-CM | POA: Diagnosis not present

## 2014-09-16 DIAGNOSIS — Z48812 Encounter for surgical aftercare following surgery on the circulatory system: Secondary | ICD-10-CM

## 2014-09-16 DIAGNOSIS — I6523 Occlusion and stenosis of bilateral carotid arteries: Secondary | ICD-10-CM | POA: Diagnosis not present

## 2014-09-16 NOTE — Progress Notes (Signed)
Filed Vitals:   09/16/14 1548 09/16/14 1552 09/16/14 1604  BP: 166/74 139/72 154/78  Pulse: 62 61 60  Temp:  97.4 F (36.3 C)   TempSrc:  Oral   Resp:  16   Height:   (1.651 m)   Weight:  179 lb (81.194 kg)   SpO2:  94%

## 2014-09-16 NOTE — Progress Notes (Signed)
Established Carotid Patient   History of Present Illness  [name] is a 80 y.o. female patient of Dr. Hart Rochester who returns for continued followup regarding her carotid occlusive disease. She had a left carotid endarterectomy performed by Dr. Hart Rochester in 2010. She did have evidence of some mild intimal hyperplasia at her January 2015 visit. She denies any neurologic symptoms such as lateralizing weakness, aphasia, amaurosis fugax, diplopia, blurred vision, or syncope. She has no claudication symptoms.  Her walking is somewhat limited by her right knee pain.  The patient denies New Medical or Surgical History.  Pt states her blood pressure at home is 130's/70's, states her blood pressure is always higher in a doctor office, and always higher in the right arm.  Pt Diabetic: no Pt smoker: former smoker, quit in 2004  Pt meds include: Statin : no ASA: yes Other anticoagulants/antiplatelets: no   Past Medical History  Diagnosis Date  . Hypertension   . Colitis   . Arthritis   . Carotid artery occlusion   . COPD (chronic obstructive pulmonary disease)     Social History Social History  Substance Use Topics  . Smoking status: Former Smoker -- 50 years    Types: Cigarettes    Quit date: 02/04/2002  . Smokeless tobacco: Never Used  . Alcohol Use: No    Family History Family History  Problem Relation Age of Onset  . Heart disease Mother     Surgical History Past Surgical History  Procedure Laterality Date  . Abdominal hysterectomy    . Cholecystectomy    . Joint replacement      RIGHT KNEE  . Carotid endarterectomy      Allergies  Allergen Reactions  . Amoxicillin   . Tetracyclines & Related     Current Outpatient Prescriptions  Medication Sig Dispense Refill  . albuterol (PROVENTIL HFA;VENTOLIN HFA) 108 (90 BASE) MCG/ACT inhaler Inhale 2 puffs into the lungs every 4 (four) hours as needed for wheezing or shortness of breath. 1 Inhaler 0  . amLODipine (NORVASC) 5  MG tablet Take 5 mg by mouth daily.      Marland Kitchen aspirin 81 MG tablet Take 81 mg by mouth daily.    Marland Kitchen atenolol (TENORMIN) 100 MG tablet Take 100 mg by mouth daily.      . benzonatate (TESSALON) 100 MG capsule Take 100 mg by mouth 3 (three) times daily as needed.      . Calcium Carbonate (CALTRATE 600 PO) Take 600 mg by mouth daily.    Marland Kitchen HYDROcodone-acetaminophen (NORCO) 10-325 MG per tablet Take 1 tablet by mouth every 6 (six) hours as needed.      Marland Kitchen ipratropium (ATROVENT) 0.06 % nasal spray Place 2 sprays into both nostrils 4 (four) times daily. For nasal congestion 15 mL 0  . LORazepam (ATIVAN) 1 MG tablet Take 1 mg by mouth every 8 (eight) hours.      Marland Kitchen losartan (COZAAR) 100 MG tablet Take 100 mg by mouth daily.    . Multiple Vitamin (MULTIVITAMIN) tablet Take 1 tablet by mouth daily.    . Omega-3 Fatty Acids (FISH OIL PO) Take 1 tablet by mouth daily.    Marland Kitchen omeprazole (PRILOSEC) 40 MG capsule Take 40 mg by mouth daily.    Marland Kitchen PREDNISONE PO Take 20 mg by mouth daily.     . sertraline (ZOLOFT) 50 MG tablet Take 50 mg by mouth daily.       No current facility-administered medications for this visit.  Review of Systems : See HPI for pertinent positives and negatives.  Physical Examination  Filed Vitals:   09/16/14 1548 09/16/14 1552 09/16/14 1604  BP: 166/74 139/72 154/78  Pulse: 62 61 60  Temp:  97.4 F (36.3 C)   TempSrc:  Oral   Resp:  16   Height:  5\' 5"  (1.651 m)   Weight:  179 lb (81.194 kg)   SpO2:  94%    Body mass index is 29.79 kg/(m^2).  General: WDWN female in NAD GAIT: normal Eyes: PERRLA Pulmonary:  Non-labored, CTAB, no rales, no rhonchi, & no wheezing.  Cardiac: regular rhythm,  no detected murmur.  VASCULAR EXAM Carotid Bruits Right Left   Negative Negative    Aorta is not palpable. Radial pulses are 2+ palpable and equal.                                                                                                                            LE Pulses  Right Left       POPLITEAL  not palpable   not palpable       POSTERIOR TIBIAL  not palpable   not palpable        DORSALIS PEDIS      ANTERIOR TIBIAL 1+ palpable  1+ palpable     Gastrointestinal: soft, nontender, BS WNL, no r/g,  no palpable masses.  Musculoskeletal: no muscle atrophy/wasting. M/S 5/5 throughout, extremities without ischemic changes.  Neurologic: A&O X 3; Appropriate Affect, Speech is normal CN 2-12 intact, pain and light touch intact in extremities, Motor exam as listed above.   Non-Invasive Vascular Imaging CAROTID DUPLEX 09/16/2014   CEREBROVASCULAR DUPLEX EVALUATION    INDICATION: Follow-up carotid disease     PREVIOUS INTERVENTION(S): Left carotid endarterectomy 10/20/2008    DUPLEX EXAM:     RIGHT  LEFT  Peak Systolic Velocities (cm/s) End Diastolic Velocities (cm/s) Plaque LOCATION Peak Systolic Velocities (cm/s) End Diastolic Velocities (cm/s) Plaque  67 11  CCA PROXIMAL 100 18   69 14  CCA MID 79 15   53 14  CCA DISTAL 61 284 15 53   474 55 HT ECA 374 36   57 11 HT/CP ICA PROXIMAL 324 47   83 19  ICA MID 91 18   139 22  ICA DISTAL 65 16     1.0 ICA / CCA Ratio (PSV) NA  Antegrade  Vertebral Flow Antegrade    Brachial Systolic Pressure (mmHg)   Within normal limits  Brachial Artery Waveforms Within normal limits     Plaque Morphology:  HM = Homogeneous, HT = Heterogeneous, CP = Calcific Plaque, SP = Smooth Plaque, IP = Irregular Plaque  ADDITIONAL FINDINGS:     IMPRESSION: 1. Evidence of <40% stenosis of the right internal carotid artery. 2. Evidence of >50% stenosis of the right external carotid artery. 3. Patent left carotid endarterectomy with evidence of 40%-59% restenosis of the proximal patch. 4. Evidence of >50%  stenosis of the left external carotid artery; likely due to patch site stenosis. 5. Bilateral vertebral artery is antegrade.    Compared to the previous exam:  No significant change compared to prior exam.        Assessment: [name] is a 80 y.o. female who is s/p left carotid endarterectomy in 2010. She has no history of stroke or TIA.   Today's carotid Duplex suggests <40% stenosis of the right internal carotid artery and a patent left carotid endarterectomy with evidence of 40%-59% restenosis of the proximal patch. No significant change compared to prior exam on 08/18/13.   Plan: Follow-up in 1 year with Carotid Duplex.   I discussed in depth with the patient the nature of atherosclerosis, and emphasized the importance of maximal medical management including strict control of blood pressure, blood glucose, and lipid levels, obtaining regular exercise, and continued cessation of smoking.  The patient is aware that without maximal medical management the underlying atherosclerotic disease process will progress, limiting the benefit of any interventions. The patient was given information about stroke prevention and what symptoms should prompt the patient to seek immediate medical care. Thank you for allowing [place] to participate in this patient's care.  Charisse March, RN, MSN, FNP-C [facility] Office: [phone]  Clinic Physician: Edilia Bo on call  09/16/2014 3:53 PM

## 2014-09-16 NOTE — Patient Instructions (Signed)
Stroke Prevention Some medical conditions and behaviors are associated with an increased chance of having a stroke. You may prevent a stroke by making healthy choices and managing medical conditions. HOW CAN I REDUCE MY RISK OF HAVING A STROKE?   Stay physically active. Get at least 30 minutes of activity on most or all days.  Do not smoke. It may also be helpful to avoid exposure to secondhand smoke.  Limit alcohol use. Moderate alcohol use is considered to be:  No more than 2 drinks per day for men.  No more than 1 drink per day for nonpregnant women.  Eat healthy foods. This involves:  Eating 5 or more servings of fruits and vegetables a day.  Making dietary changes that address high blood pressure (hypertension), high cholesterol, diabetes, or obesity.  Manage your cholesterol levels.  Making food choices that are high in fiber and low in saturated fat, trans fat, and cholesterol may control cholesterol levels.  Take any prescribed medicines to control cholesterol as directed by your health care provider.  Manage your diabetes.  Controlling your carbohydrate and sugar intake is recommended to manage diabetes.  Take any prescribed medicines to control diabetes as directed by your health care provider.  Control your hypertension.  Making food choices that are low in salt (sodium), saturated fat, trans fat, and cholesterol is recommended to manage hypertension.  Take any prescribed medicines to control hypertension as directed by your health care provider.  Maintain a healthy weight.  Reducing calorie intake and making food choices that are low in sodium, saturated fat, trans fat, and cholesterol are recommended to manage weight.  Stop drug abuse.  Avoid taking birth control pills.  Talk to your health care provider about the risks of taking birth control pills if you are over 35 years old, smoke, get migraines, or have ever had a blood clot.  Get evaluated for sleep  disorders (sleep apnea).  Talk to your health care provider about getting a sleep evaluation if you snore a lot or have excessive sleepiness.  Take medicines only as directed by your health care provider.  For some people, aspirin or blood thinners (anticoagulants) are helpful in reducing the risk of forming abnormal blood clots that can lead to stroke. If you have the irregular heart rhythm of atrial fibrillation, you should be on a blood thinner unless there is a good reason you cannot take them.  Understand all your medicine instructions.  Make sure that other conditions (such as anemia or atherosclerosis) are addressed. SEEK IMMEDIATE MEDICAL CARE IF:   You have sudden weakness or numbness of the face, arm, or leg, especially on one side of the body.  Your face or eyelid droops to one side.  You have sudden confusion.  You have trouble speaking (aphasia) or understanding.  You have sudden trouble seeing in one or both eyes.  You have sudden trouble walking.  You have dizziness.  You have a loss of balance or coordination.  You have a sudden, severe headache with no known cause.  You have new chest pain or an irregular heartbeat. Any of these symptoms may represent a serious problem that is an emergency. Do not wait to see if the symptoms will go away. Get medical help at once. Call your local emergency services (911 in [place] Do not drive yourself to the hospital. Document Released: 02/16/2004 Document Revised: 05/25/2013 Document Reviewed: 07/11/2012 [employer] Patient Information 2015 [employer]. This information is not intended to replace advice given   to you by your health care provider. Make sure you discuss any questions you have with your health care provider.  

## 2014-09-17 NOTE — Addendum Note (Signed)
Addended by: Adria Dill L on: 09/17/2014 02:12 PM   Modules accepted: Orders

## 2014-12-27 ENCOUNTER — Other Ambulatory Visit: Payer: Self-pay | Admitting: Family Medicine

## 2014-12-27 ENCOUNTER — Ambulatory Visit
Admission: RE | Admit: 2014-12-27 | Discharge: 2014-12-27 | Disposition: A | Discharge status: Home / Self Care | Payer: Medicare Other | Source: Ambulatory Visit | Attending: Family Medicine | Admitting: Family Medicine

## 2014-12-27 DIAGNOSIS — S0083XA Contusion of other part of head, initial encounter: Secondary | ICD-10-CM

## 2015-03-30 ENCOUNTER — Other Ambulatory Visit: Payer: Self-pay | Admitting: Sports Medicine

## 2015-03-30 DIAGNOSIS — M542 Cervicalgia: Secondary | ICD-10-CM

## 2015-04-07 ENCOUNTER — Other Ambulatory Visit: Payer: Medicare Other

## 2015-04-07 ENCOUNTER — Ambulatory Visit
Admission: RE | Admit: 2015-04-07 | Discharge: 2015-04-07 | Disposition: A | Discharge status: Home / Self Care | Payer: Medicare Other | Source: Ambulatory Visit | Attending: Sports Medicine | Admitting: Sports Medicine

## 2015-04-07 DIAGNOSIS — M542 Cervicalgia: Secondary | ICD-10-CM

## 2015-06-28 ENCOUNTER — Other Ambulatory Visit: Payer: Self-pay | Admitting: Family Medicine

## 2015-06-28 DIAGNOSIS — Z1231 Encounter for screening mammogram for malignant neoplasm of breast: Secondary | ICD-10-CM

## 2015-07-14 ENCOUNTER — Ambulatory Visit
Admission: RE | Admit: 2015-07-14 | Discharge: 2015-07-14 | Disposition: A | Discharge status: Home / Self Care | Payer: Medicare Other | Source: Ambulatory Visit | Attending: Family Medicine | Admitting: Family Medicine

## 2015-07-14 DIAGNOSIS — Z1231 Encounter for screening mammogram for malignant neoplasm of breast: Secondary | ICD-10-CM

## 2015-08-23 ENCOUNTER — Other Ambulatory Visit: Payer: Self-pay | Admitting: Sports Medicine

## 2015-08-23 DIAGNOSIS — M545 Low back pain: Secondary | ICD-10-CM

## 2015-09-03 ENCOUNTER — Ambulatory Visit
Admission: RE | Admit: 2015-09-03 | Discharge: 2015-09-03 | Disposition: A | Discharge status: Home / Self Care | Payer: Medicare Other | Source: Ambulatory Visit | Attending: Sports Medicine | Admitting: Sports Medicine

## 2015-09-03 DIAGNOSIS — M545 Low back pain: Secondary | ICD-10-CM

## 2015-09-16 ENCOUNTER — Encounter (HOSPITAL_COMMUNITY): Payer: Medicare Other

## 2015-09-16 ENCOUNTER — Encounter: Payer: Self-pay | Admitting: Family

## 2015-09-16 ENCOUNTER — Ambulatory Visit: Payer: Medicare Other | Admitting: Family

## 2015-09-21 ENCOUNTER — Ambulatory Visit (INDEPENDENT_AMBULATORY_CARE_PROVIDER_SITE_OTHER): Payer: Medicare Other | Admitting: Family

## 2015-09-21 ENCOUNTER — Ambulatory Visit (HOSPITAL_COMMUNITY)
Admission: RE | Admit: 2015-09-21 | Discharge: 2015-09-21 | Disposition: A | Discharge status: Home / Self Care | Payer: Medicare Other | Source: Ambulatory Visit | Attending: Family | Admitting: Family

## 2015-09-21 ENCOUNTER — Encounter: Payer: Self-pay | Admitting: Family

## 2015-09-21 VITALS — BP 162/71 | HR 76 | Temp 97.1°F | Resp 20 | Ht 65.0 in | Wt 160.5 lb

## 2015-09-21 DIAGNOSIS — Z87891 Personal history of nicotine dependence: Secondary | ICD-10-CM | POA: Diagnosis not present

## 2015-09-21 DIAGNOSIS — Z48812 Encounter for surgical aftercare following surgery on the circulatory system: Secondary | ICD-10-CM | POA: Insufficient documentation

## 2015-09-21 DIAGNOSIS — Z9889 Other specified postprocedural states: Secondary | ICD-10-CM | POA: Insufficient documentation

## 2015-09-21 DIAGNOSIS — I6523 Occlusion and stenosis of bilateral carotid arteries: Secondary | ICD-10-CM | POA: Diagnosis not present

## 2015-09-21 LAB — VAS US CAROTID
LCCADSYS: -52 cm/s
LCCAPDIAS: 10 cm/s
LCCAPSYS: 107 cm/s
LICAPSYS: 237 cm/s
Left CCA dist dias: -9 cm/s
Left ICA dist dias: -12 cm/s
Left ICA dist sys: -52 cm/s
Left ICA prox dias: 21 cm/s
RCCADSYS: -93 cm/s
RCCAPDIAS: 7 cm/s
RIGHT CCA MID DIAS: 8 cm/s
Right CCA prox sys: 57 cm/s

## 2015-09-21 NOTE — Progress Notes (Signed)
Vitals:   09/21/15 1221 09/21/15 1231  BP: (!) 176/78 (!) 162/71  Pulse: 76   Resp: 20   Temp: 97.1 F (36.2 C)   TempSrc: Oral   Weight: 160 lb 8 oz (72.8 kg)   Height: 5\' 5"  (1.651 m)

## 2015-09-21 NOTE — Progress Notes (Signed)
Chief Complaint: Follow up Extracranial Carotid Artery Stenosis   History of Present Illness  [name] is a 81 y.o. female patient of Dr. Hart Rochester who returns for continued followup regarding her carotid occlusive disease. She had a left carotid endarterectomy performed by Dr. Hart Rochester in 2010. She did have evidence of some mild intimal hyperplasia at her January 2015 visit. She denies any neurologic symptoms such as lateralizing weakness, aphasia, amaurosis fugax, diplopia, blurred vision, or syncope.  She has no claudication symptoms.  Her walking is somewhat limited by her right knee pain; she sees ortho for this who prescribed a NTG patch to her right knee, which helps her pain.   The patient denies New Medical or Surgical History.  Pt states her blood pressure at home is 130's/70's, states her blood pressure is always higher in a doctor office, and always higher in the right arm.  Pt Diabetic: no Pt smoker: former smoker, quit in 2004  Pt meds include: Statin : no ASA: yes Other anticoagulants/antiplatelets: no   Past Medical History:  Diagnosis Date  . Arthritis   . Carotid artery occlusion   . Colitis   . COPD (chronic obstructive pulmonary disease) (HCC)   . Hypertension   . Keratosis, seborrheic     Social History Social History  Substance Use Topics  . Smoking status: Former Smoker    Years: 50.00    Types: Cigarettes    Quit date: 02/04/2002  . Smokeless tobacco: Never Used  . Alcohol use No    Family History Family History  Problem Relation Age of Onset  . Heart disease Mother     After age 14  . AAA (abdominal aortic aneurysm) Sister   . AAA (abdominal aortic aneurysm) Brother   . Heart attack Brother     Surgical History Past Surgical History:  Procedure Laterality Date  . ABDOMINAL HYSTERECTOMY    . CAROTID ENDARTERECTOMY Left 2010  . CHOLECYSTECTOMY    . EYE SURGERY Bilateral 06/2014   cataracts  . JOINT REPLACEMENT     RIGHT KNEE     Allergies  Allergen Reactions  . Amoxicillin Nausea Only and Rash  . Tetracyclines & Related Nausea Only and Rash    Current Outpatient Prescriptions  Medication Sig Dispense Refill  . amLODipine (NORVASC) 5 MG tablet Take 5 mg by mouth daily.      Marland Kitchen aspirin 81 MG tablet Take 81 mg by mouth daily.    Marland Kitchen atenolol (TENORMIN) 25 MG tablet daily.  3  . Calcium Carbonate (CALTRATE 600 PO) Take 1,200 mg by mouth daily.     Marland Kitchen LORazepam (ATIVAN) 1 MG tablet Take 1 mg by mouth every 8 (eight) hours.      Marland Kitchen losartan (COZAAR) 100 MG tablet Take 100 mg by mouth daily.    . Multiple Vitamin (MULTIVITAMIN) tablet Take 1 tablet by mouth daily.    Marland Kitchen omeprazole (PRILOSEC) 40 MG capsule Take 40 mg by mouth daily.    . sertraline (ZOLOFT) 50 MG tablet Take 50 mg by mouth daily.      Marland Kitchen albuterol (PROVENTIL HFA;VENTOLIN HFA) 108 (90 BASE) MCG/ACT inhaler Inhale 2 puffs into the lungs every 4 (four) hours as needed for wheezing or shortness of breath. 1 Inhaler 0  . atenolol (TENORMIN) 100 MG tablet Take 100 mg by mouth daily.      . benzonatate (TESSALON) 100 MG capsule Take 100 mg by mouth 3 (three) times daily as needed.      Marland Kitchen  HYDROcodone-acetaminophen (NORCO) 10-325 MG per tablet Take 1 tablet by mouth every 6 (six) hours as needed.      Marland Kitchen ipratropium (ATROVENT) 0.06 % nasal spray Place 2 sprays into both nostrils 4 (four) times daily. For nasal congestion (Patient not taking: Reported on 09/16/2014) 15 mL 0  . Omega-3 Fatty Acids (FISH OIL PO) Take 1 tablet by mouth daily.    Marland Kitchen PREDNISONE PO Take 5 mg by mouth daily.     . Probiotic Product (PROBIOTIC ADVANCED PO) Take by mouth daily.     No current facility-administered medications for this visit.     Review of Systems : See HPI for pertinent positives and negatives.  Physical Examination  Vitals:   09/21/15 1221 09/21/15 1231  BP: (!) 176/78 (!) 162/71  Pulse: 76   Resp: 20   Temp: 97.1 F (36.2 C)   TempSrc: Oral   Weight: 160 lb 8 oz  (72.8 kg)   Height: 5\' 5"  (1.651 m)    Body mass index is 26.71 kg/m.  General: WDWN female in NAD GAIT: normal Eyes: PERRLA Pulmonary: Respirations are non-labored, CTAB, good air movement  Cardiac: regular rhythm, no detected murmur.  VASCULAR EXAM Carotid Bruits Right Left   Negative Negative    Aorta is not palpable. Radial pulses are 2+ palpable and equal.                                                                                                                                                              LE Pulses Right Left       POPLITEAL  not palpable  not palpable       POSTERIOR TIBIAL  not palpable  not palpable       DORSALIS PEDIS      ANTERIOR TIBIAL 1+ palpable 1+ palpable    Gastrointestinal: soft, nontender, BS WNL, no r/g,  no palpable masses.  Musculoskeletal: no muscle atrophy/wasting. M/S 5/5 throughout, extremities without ischemic changes.  Neurologic: A&O X 3; Appropriate Affect, Speech is normal CN 2-12 intact, pain and light touch intact in extremities, Motor exam as listed above.     Assessment: [name] is a 81 y.o. female who is s/p left carotid endarterectomy in 2010. She has no history of stroke or TIA.    DATA Today's carotid Duplex suggests <40% stenosis of the right internal carotid artery and a patent left carotid endarterectomy with evidence of >50% restenosis of the proximal patch. No significant change compared to prior exam on 08/18/13 and 09/16/14. I discussed with Dr. Edilia Bo pt's HPI and duplex results from today.   Plan: Follow-up in 1 year with Carotid Duplex scan.   I discussed in depth with the patient the nature of atherosclerosis, and emphasized the importance of maximal medical  management including strict control of blood pressure, blood glucose, and lipid levels, obtaining regular exercise, and continued cessation of smoking.  The patient is aware that without maximal medical management the underlying  atherosclerotic disease process will progress, limiting the benefit of any interventions. The patient was given information about stroke prevention and what symptoms should prompt the patient to seek immediate medical care. Thank you for allowing us to participate in this patient's care.  Charisse MarchSuzanne Angelino Rumery, RN, MSN, FNP-C [facility] [place] Office: [phone]  Clinic Physician: Edilia BoDickson  09/21/15 12:47 PM

## 2015-09-21 NOTE — Patient Instructions (Signed)
Stroke Prevention Some medical conditions and behaviors are associated with an increased chance of having a stroke. You may prevent a stroke by making healthy choices and managing medical conditions. HOW CAN I REDUCE MY RISK OF HAVING A STROKE?   Stay physically active. Get at least 30 minutes of activity on most or all days.  Do not smoke. It may also be helpful to avoid exposure to secondhand smoke.  Limit alcohol use. Moderate alcohol use is considered to be:  No more than 2 drinks per day for men.  No more than 1 drink per day for nonpregnant women.  Eat healthy foods. This involves:  Eating 5 or more servings of fruits and vegetables a day.  Making dietary changes that address high blood pressure (hypertension), high cholesterol, diabetes, or obesity.  Manage your cholesterol levels.  Making food choices that are high in fiber and low in saturated fat, trans fat, and cholesterol may control cholesterol levels.  Take any prescribed medicines to control cholesterol as directed by your health care provider.  Manage your diabetes.  Controlling your carbohydrate and sugar intake is recommended to manage diabetes.  Take any prescribed medicines to control diabetes as directed by your health care provider.  Control your hypertension.  Making food choices that are low in salt (sodium), saturated fat, trans fat, and cholesterol is recommended to manage hypertension.  Ask your health care provider if you need treatment to lower your blood pressure. Take any prescribed medicines to control hypertension as directed by your health care provider.  If you are 18-39 years of age, have your blood pressure checked every 3-5 years. If you are 40 years of age or older, have your blood pressure checked every year.  Maintain a healthy weight.  Reducing calorie intake and making food choices that are low in sodium, saturated fat, trans fat, and cholesterol are recommended to manage  weight.  Stop drug abuse.  Avoid taking birth control pills.  Talk to your health care provider about the risks of taking birth control pills if you are over 35 years old, smoke, get migraines, or have ever had a blood clot.  Get evaluated for sleep disorders (sleep apnea).  Talk to your health care provider about getting a sleep evaluation if you snore a lot or have excessive sleepiness.  Take medicines only as directed by your health care provider.  For some people, aspirin or blood thinners (anticoagulants) are helpful in reducing the risk of forming abnormal blood clots that can lead to stroke. If you have the irregular heart rhythm of atrial fibrillation, you should be on a blood thinner unless there is a good reason you cannot take them.  Understand all your medicine instructions.  Make sure that other conditions (such as anemia or atherosclerosis) are addressed. SEEK IMMEDIATE MEDICAL CARE IF:   You have sudden weakness or numbness of the face, arm, or leg, especially on one side of the body.  Your face or eyelid droops to one side.  You have sudden confusion.  You have trouble speaking (aphasia) or understanding.  You have sudden trouble seeing in one or both eyes.  You have sudden trouble walking.  You have dizziness.  You have a loss of balance or coordination.  You have a sudden, severe headache with no known cause.  You have new chest pain or an irregular heartbeat. Any of these symptoms may represent a serious problem that is an emergency. Do not wait to see if the symptoms will   go away. Get medical help at once. Call your local emergency services (911 in [place] Do not drive yourself to the hospital.   This information is not intended to replace advice given to you by your health care provider. Make sure you discuss any questions you have with your health care provider.   Document Released: 02/16/2004 Document Revised: 01/29/2014 Document Reviewed:  07/11/2012 Elsevier Interactive Patient Education 2016 [employer]  

## 2015-09-22 ENCOUNTER — Other Ambulatory Visit: Payer: Self-pay | Admitting: Family

## 2015-09-22 DIAGNOSIS — I6529 Occlusion and stenosis of unspecified carotid artery: Secondary | ICD-10-CM

## 2015-09-28 ENCOUNTER — Other Ambulatory Visit: Payer: Self-pay | Admitting: Family Medicine

## 2015-09-28 ENCOUNTER — Ambulatory Visit
Admission: RE | Admit: 2015-09-28 | Discharge: 2015-09-28 | Disposition: A | Discharge status: Home / Self Care | Payer: Medicare Other | Source: Ambulatory Visit | Attending: Family Medicine | Admitting: Family Medicine

## 2015-09-28 DIAGNOSIS — R059 Cough, unspecified: Secondary | ICD-10-CM

## 2015-09-28 DIAGNOSIS — R05 Cough: Secondary | ICD-10-CM

## 2015-09-28 DIAGNOSIS — R1013 Epigastric pain: Secondary | ICD-10-CM

## 2015-10-07 ENCOUNTER — Ambulatory Visit
Admission: RE | Admit: 2015-10-07 | Discharge: 2015-10-07 | Disposition: A | Discharge status: Home / Self Care | Payer: Medicare Other | Source: Ambulatory Visit | Attending: Family Medicine | Admitting: Family Medicine

## 2015-10-07 DIAGNOSIS — R1013 Epigastric pain: Secondary | ICD-10-CM

## 2015-10-24 ENCOUNTER — Encounter (HOSPITAL_COMMUNITY): Payer: Self-pay | Admitting: *Deleted

## 2015-10-24 ENCOUNTER — Emergency Department (HOSPITAL_COMMUNITY)
Admission: EM | Admit: 2015-10-24 | Discharge: 2015-10-24 | Disposition: A | Discharge status: Home / Self Care | Payer: Medicare Other | Attending: Emergency Medicine | Admitting: Emergency Medicine

## 2015-10-24 DIAGNOSIS — Z79899 Other long term (current) drug therapy: Secondary | ICD-10-CM | POA: Diagnosis not present

## 2015-10-24 DIAGNOSIS — J449 Chronic obstructive pulmonary disease, unspecified: Secondary | ICD-10-CM | POA: Diagnosis not present

## 2015-10-24 DIAGNOSIS — R112 Nausea with vomiting, unspecified: Secondary | ICD-10-CM

## 2015-10-24 DIAGNOSIS — Z7982 Long term (current) use of aspirin: Secondary | ICD-10-CM | POA: Diagnosis not present

## 2015-10-24 DIAGNOSIS — Z87891 Personal history of nicotine dependence: Secondary | ICD-10-CM | POA: Insufficient documentation

## 2015-10-24 DIAGNOSIS — I10 Essential (primary) hypertension: Secondary | ICD-10-CM | POA: Diagnosis not present

## 2015-10-24 HISTORY — DX: Diverticulitis of intestine, part unspecified, without perforation or abscess without bleeding: K57.92

## 2015-10-24 LAB — COMPREHENSIVE METABOLIC PANEL
ALBUMIN: 4.4 g/dL (ref 3.5–5.0)
ALT: 13 U/L — ABNORMAL LOW (ref 14–54)
ANION GAP: 11 (ref 5–15)
AST: 18 U/L (ref 15–41)
Alkaline Phosphatase: 94 U/L (ref 38–126)
BILIRUBIN TOTAL: 0.8 mg/dL (ref 0.3–1.2)
BUN: 12 mg/dL (ref 6–20)
CHLORIDE: 98 mmol/L — AB (ref 101–111)
CO2: 27 mmol/L (ref 22–32)
Calcium: 9.6 mg/dL (ref 8.9–10.3)
Creatinine, Ser: 1.09 mg/dL — ABNORMAL HIGH (ref 0.44–1.00)
GFR calc Af Amer: 54 mL/min — ABNORMAL LOW (ref >60)
GFR, EST NON AFRICAN AMERICAN: 47 mL/min — AB (ref >60)
GLUCOSE: 121 mg/dL — AB (ref 65–99)
POTASSIUM: 3.4 mmol/L — AB (ref 3.5–5.1)
Sodium: 136 mmol/L (ref 135–145)
TOTAL PROTEIN: 8.8 g/dL — AB (ref 6.5–8.1)

## 2015-10-24 LAB — CBC
HEMATOCRIT: 43.3 % (ref 36.0–46.0)
Hemoglobin: 14.3 g/dL (ref 12.0–15.0)
MCH: 27.1 pg (ref 26.0–34.0)
MCHC: 33 g/dL (ref 30.0–36.0)
MCV: 82 fL (ref 78.0–100.0)
PLATELETS: 375 10*3/uL (ref 150–400)
RBC: 5.28 MIL/uL — ABNORMAL HIGH (ref 3.87–5.11)
RDW: 14.8 % (ref 11.5–15.5)
WBC: 10.7 10*3/uL — ABNORMAL HIGH (ref 4.0–10.5)

## 2015-10-24 LAB — URINE MICROSCOPIC-ADD ON

## 2015-10-24 LAB — URINALYSIS, ROUTINE W REFLEX MICROSCOPIC
BILIRUBIN URINE: NEGATIVE
Glucose, UA: NEGATIVE mg/dL
Ketones, ur: NEGATIVE mg/dL
LEUKOCYTES UA: NEGATIVE
Nitrite: NEGATIVE
PROTEIN: NEGATIVE mg/dL
Specific Gravity, Urine: 1.015 (ref 1.005–1.030)
pH: 6 (ref 5.0–8.0)

## 2015-10-24 MED ORDER — SODIUM CHLORIDE 0.9 % IV BOLUS (SEPSIS)
500.0000 mL | Freq: Once | INTRAVENOUS | Status: AC
  Administered 2015-10-24: 500 mL via INTRAVENOUS

## 2015-10-24 MED ORDER — ONDANSETRON 8 MG PO TBDP: 8.0000 mg | ORAL_TABLET | Freq: Three times a day (TID) | ORAL | 0 refills | Status: DC | PRN

## 2015-10-24 MED ORDER — POTASSIUM CHLORIDE CRYS ER 20 MEQ PO TBCR
40.0000 meq | EXTENDED_RELEASE_TABLET | Freq: Once | ORAL | Status: AC
  Administered 2015-10-24: 40 meq via ORAL
  Filled 2015-10-24: qty 2

## 2015-10-24 MED ORDER — SODIUM CHLORIDE 0.9 % IV BOLUS (SEPSIS)
1000.0000 mL | Freq: Once | INTRAVENOUS | Status: AC
  Administered 2015-10-24: 1000 mL via INTRAVENOUS

## 2015-10-24 NOTE — ED Provider Notes (Signed)
[facility]-EMERGENCY DEPT Provider Note   CSN: [id number] Arrival date & time: 10/24/15  4098     History   Chief Complaint Chief Complaint  Patient presents with  . Emesis    HPI [name] is a 81 y.o. female.  Patient c/o an episode of diarrhea yesterday, and then several episodes of nausea and vomiting starting last night. Emesis yellowish, not bloody. Denies abdominal distension or pain. No fever or chills. Denies dysuria or gu c/o. Denies known ill contacts or bad food ingestion. Notes hx recurrent brief episodes of diarrhea for the past few months, indicates had recent u/s to evaluate and that was ok. Has remote hx appendectomy, cholecystectomy and hysterectomy. States has been having normal bms, up until diarrhea stool yesterday.       Emesis   Pertinent negatives include no abdominal pain, no chills, no fever and no headaches.    Past Medical History:  Diagnosis Date  . Arthritis   . Carotid artery occlusion   . Colitis   . COPD (chronic obstructive pulmonary disease) (HCC)   . Diverticulitis   . Hypertension   . Keratosis, seborrheic     Patient Active Problem List   Diagnosis Date Noted  . Encounter for postoperative carotid endarterectomy surveillance 09/21/2015  . Occlusion and stenosis of carotid artery without mention of cerebral infarction 02/05/2012    Past Surgical History:  Procedure Laterality Date  . ABDOMINAL HYSTERECTOMY    . CAROTID ENDARTERECTOMY Left 2010  . CHOLECYSTECTOMY    . EYE SURGERY Bilateral 06/2014   cataracts  . JOINT REPLACEMENT     RIGHT KNEE    OB History    No data available       Home Medications    Prior to Admission medications   Medication Sig Start Date End Date Taking? Authorizing Provider  albuterol (PROVENTIL HFA;VENTOLIN HFA) 108 (90 BASE) MCG/ACT inhaler Inhale 2 puffs into the lungs every 4 (four) hours as needed for wheezing or shortness of breath. 01/13/11 01/13/12  Delanna Notice, MD  amLODipine  (NORVASC) 5 MG tablet Take 5 mg by mouth daily.      Historical Provider, MD  aspirin 81 MG tablet Take 81 mg by mouth daily.    Historical Provider, MD  atenolol (TENORMIN) 100 MG tablet Take 100 mg by mouth daily.      Historical Provider, MD  atenolol (TENORMIN) 25 MG tablet daily. 07/27/14   Historical Provider, MD  benzonatate (TESSALON) 100 MG capsule Take 100 mg by mouth 3 (three) times daily as needed.      Historical Provider, MD  Calcium Carbonate (CALTRATE 600 PO) Take 1,200 mg by mouth daily.     Historical Provider, MD  HYDROcodone-acetaminophen (NORCO) 10-325 MG per tablet Take 1 tablet by mouth every 6 (six) hours as needed.      Historical Provider, MD  ipratropium (ATROVENT) 0.06 % nasal spray Place 2 sprays into both nostrils 4 (four) times daily. For nasal congestion Patient not taking: Reported on 09/16/2014 01/16/14   Mathis Fare Presson, PA  LORazepam (ATIVAN) 1 MG tablet Take 1 mg by mouth every 8 (eight) hours.      Historical Provider, MD  losartan (COZAAR) 100 MG tablet Take 100 mg by mouth daily.    Historical Provider, MD  Multiple Vitamin (MULTIVITAMIN) tablet Take 1 tablet by mouth daily.    Historical Provider, MD  Omega-3 Fatty Acids (FISH OIL PO) Take 1 tablet by mouth daily.    Historical Provider,  MD  omeprazole (PRILOSEC) 40 MG capsule Take 40 mg by mouth daily.    Historical Provider, MD  PREDNISONE PO Take 5 mg by mouth daily.     Historical Provider, MD  Probiotic Product (PROBIOTIC ADVANCED PO) Take by mouth daily.    Historical Provider, MD  sertraline (ZOLOFT) 50 MG tablet Take 50 mg by mouth daily.      Historical Provider, MD    Family History Family History  Problem Relation Age of Onset  . Heart disease Mother     After age 3  . AAA (abdominal aortic aneurysm) Sister   . AAA (abdominal aortic aneurysm) Brother   . Heart attack Brother     Social History Social History  Substance Use Topics  . Smoking status: Former Smoker    Years: 50.00     Types: Cigarettes    Quit date: 02/04/2002  . Smokeless tobacco: Never Used  . Alcohol use No     Allergies   Amoxicillin and Tetracyclines & related   Review of Systems Review of Systems  Constitutional: Negative for chills and fever.  HENT: Negative for sore throat.   Eyes: Negative for redness.  Respiratory: Negative for shortness of breath.   Cardiovascular: Negative for chest pain.  Gastrointestinal: Positive for vomiting. Negative for abdominal pain.  Genitourinary: Negative for dysuria and flank pain.  Musculoskeletal: Negative for back pain and neck pain.  Skin: Negative for rash.  Neurological: Negative for headaches.  Hematological: Does not bruise/bleed easily.  Psychiatric/Behavioral: Negative for confusion.     Physical Exam Updated Vital Signs BP 150/60   Pulse 64   Temp 98.2 F (36.8 C) (Oral)   Resp 16   Ht 5\' 5"  (1.651 m)   Wt 71.7 kg   SpO2 91%   BMI 26.29 kg/m   Physical Exam  Constitutional: She appears well-developed and well-nourished. No distress.  HENT:  Mm mildly dry.   Eyes: Conjunctivae are normal. No scleral icterus.  Neck: Neck supple. No tracheal deviation present.  Cardiovascular: Normal rate, regular rhythm, normal heart sounds and intact distal pulses.   Pulmonary/Chest: Effort normal and breath sounds normal. No respiratory distress.  Abdominal: Soft. Normal appearance. She exhibits no distension and no mass. There is no tenderness. There is no rebound and no guarding. No hernia.  Genitourinary:  Genitourinary Comments: No cva tenderness  Musculoskeletal: She exhibits no edema.  Neurological: She is alert.  Skin: Skin is warm and dry. No rash noted.  Psychiatric: She has a normal mood and affect.  Nursing note and vitals reviewed.    ED Treatments / Results  Labs (all labs ordered are listed, but only abnormal results are displayed) Results for orders placed or performed during the hospital encounter of 10/24/15  CBC   Result Value Ref Range   WBC 10.7 (H) 4.0 - 10.5 K/uL   RBC 5.28 (H) 3.87 - 5.11 MIL/uL   Hemoglobin 14.3 12.0 - 15.0 g/dL   HCT 96.0 45.4 - 09.8 %   MCV 82.0 78.0 - 100.0 fL   MCH 27.1 26.0 - 34.0 pg   MCHC 33.0 30.0 - 36.0 g/dL   RDW 11.9 14.7 - 82.9 %   Platelets 375 150 - 400 K/uL  Comprehensive metabolic panel  Result Value Ref Range   Sodium 136 135 - 145 mmol/L   Potassium 3.4 (L) 3.5 - 5.1 mmol/L   Chloride 98 (L) 101 - 111 mmol/L   CO2 27 22 - 32 mmol/L  Glucose, Bld 121 (H) 65 - 99 mg/dL   BUN 12 6 - 20 mg/dL   Creatinine, Ser 4.09 (H) 0.44 - 1.00 mg/dL   Calcium 9.6 8.9 - 81.1 mg/dL   Total Protein 8.8 (H) 6.5 - 8.1 g/dL   Albumin 4.4 3.5 - 5.0 g/dL   AST 18 15 - 41 U/L   ALT 13 (L) 14 - 54 U/L   Alkaline Phosphatase 94 38 - 126 U/L   Total Bilirubin 0.8 0.3 - 1.2 mg/dL   GFR calc non Af Amer 47 (L) >60 mL/min   GFR calc Af Amer 54 (L) >60 mL/min   Anion gap 11 5 - 15  Urinalysis, Routine w reflex microscopic (not at [facility])  Result Value Ref Range   Color, Urine YELLOW YELLOW   APPearance CLEAR CLEAR   Specific Gravity, Urine 1.015 1.005 - 1.030   pH 6.0 5.0 - 8.0   Glucose, UA NEGATIVE NEGATIVE mg/dL   Hgb urine dipstick TRACE (A) NEGATIVE   Bilirubin Urine NEGATIVE NEGATIVE   Ketones, ur NEGATIVE NEGATIVE mg/dL   Protein, ur NEGATIVE NEGATIVE mg/dL   Nitrite NEGATIVE NEGATIVE   Leukocytes, UA NEGATIVE NEGATIVE  Urine microscopic-add on  Result Value Ref Range   Squamous Epithelial / LPF 0-5 (A) NONE SEEN   WBC, UA 0-5 0 - 5 WBC/hpf   RBC / HPF 0-5 0 - 5 RBC/hpf   Bacteria, UA RARE (A) NONE SEEN   Casts HYALINE CASTS (A) NEGATIVE   Dg Chest 2 View  Result Date: 09/28/2015 CLINICAL DATA:  Increased cough for 1 month. EXAM: CHEST  2 VIEW COMPARISON:  06/18/2012 FINDINGS: Mild hyperinflation of the lungs, stable. Biapical scarring. Heart and mediastinal contours are within normal limits. No focal opacities or effusions. No acute bony abnormality.  IMPRESSION: Hyperinflation/ chronic changes.  No active disease. Electronically Signed   By: Charlett Nose M.D.   On: 09/28/2015 13:06   US Abdomen Complete  Result Date: 10/07/2015 CLINICAL DATA:  Abdominal pain for 3 months, diarrhea, status postcholecystectomy EXAM: ABDOMEN ULTRASOUND COMPLETE COMPARISON:  CT scan 03/29/2004 FINDINGS: Gallbladder: Surgically absent Common bile duct: Diameter: 6.2 mm in diameter Liver: No focal hepatic mass. Mild heterogeneous increased echogenicity of the liver suspicious for fatty infiltration. IVC: No abnormality visualized. Pancreas: Visualized portion unremarkable. Spleen: Size and appearance within normal limits. Measures 6.5 cm in length Right Kidney: Length: 10.4 cm. Echogenicity within normal limits. No mass or hydronephrosis visualized. Left Kidney: Length: 10.7 cm. Echogenicity within normal limits. No mass or hydronephrosis visualized. Abdominal aorta: No aneurysm visualized. Measures up to 2 cm in diameter Other findings: None. IMPRESSION: 1. Surgical absent gallbladder. 2. No hydronephrosis. 3. No aortic aneurysm. 4. Mild heterogeneous increased echogenicity of the liver suspicious for fatty infiltration. Electronically Signed   By: Natasha Mead M.D.   On: 10/07/2015 09:47    EKG  EKG Interpretation None       Radiology No results found.  Procedures Procedures (including critical care time)  Medications Ordered in ED Medications - No data to display   Initial Impression / Assessment and Plan / ED Course  I have reviewed the triage vital signs and the nursing notes.  Pertinent labs & imaging results that were available during my care of the patient were reviewed by me and considered in my medical decision making (see chart for details).  Clinical Course    Iv ns bolus. Labs sent.   Reviewed nursing notes and prior charts for additional history.  Patient declines needing nausea medication.   K slightly low, kcl po given.  Po fluids  - pt tolerates. No recurrent nv.   Recheck abd soft nt.  Post ivf, pt tolerating po fluids, and feels improved with no abd pain or tenderness, and no emesis.  Patient currently appears stable for d/c.      Final Clinical Impressions(s) / ED Diagnoses   Final diagnoses:  None    New Prescriptions New Prescriptions   No medications on file     Cathren LaineKevin Nare Gaspari, MD 10/24/15 1257

## 2015-10-24 NOTE — Discharge Instructions (Signed)
It was our pleasure to provide your ER care today - we hope that you feel better.  Rest. Drink plenty of fluids.  Take zofran as need for nausea.  Follow up with primary care doctor in the next 1-2 days if symptoms fail to improve/resolve.  Return to ER right away if worse, persistent vomiting, new or severe abdominal pain, fevers, feeling weak/faint, other concern.

## 2015-10-24 NOTE — ED Notes (Signed)
Pt tolerating PO fluids

## 2015-10-24 NOTE — ED Notes (Signed)
Bed: WA01 Expected date:  Expected time:  Means of arrival:  Comments: 81 yo F, Nausea

## 2015-10-24 NOTE — ED Triage Notes (Addendum)
Per EMS - patient comes from home where she lives with her sister with c/o N/V since yesterday and diarrhea 2 days prior.  The diarrhea has resolved and she now has only N/V x12 hours.  Patient denies pain, melena, hematochezia and blood in emesis.  Patient was unable to take her morning medications today.  Patient did not receive anything en route with EMS.  BP 190 palp, HR 70, 96% on RA, 126 cbg.

## 2015-10-24 NOTE — ED Notes (Signed)
Pt given water 

## 2015-10-24 NOTE — ED Notes (Signed)
Pt ambulated to and from restroom with no issues. Given [place] sandwich and ginger ale

## 2015-11-21 ENCOUNTER — Other Ambulatory Visit (INDEPENDENT_AMBULATORY_CARE_PROVIDER_SITE_OTHER): Payer: Self-pay

## 2015-11-21 MED ORDER — NITROGLYCERIN 0.2 MG/HR TD PT24: MEDICATED_PATCH | TRANSDERMAL | 1 refills | Status: DC

## 2015-11-21 NOTE — Telephone Encounter (Signed)
Received fax for 90 Day Supply of  Nitroglycerin refill request from [facility] pharmacy.

## 2015-12-13 ENCOUNTER — Other Ambulatory Visit (INDEPENDENT_AMBULATORY_CARE_PROVIDER_SITE_OTHER): Payer: Self-pay

## 2015-12-13 MED ORDER — NITROGLYCERIN 0.2 MG/HR TD PT24: MEDICATED_PATCH | TRANSDERMAL | 1 refills | Status: DC

## 2016-09-19 ENCOUNTER — Other Ambulatory Visit: Payer: Self-pay | Admitting: Family Medicine

## 2016-09-19 ENCOUNTER — Encounter: Payer: Self-pay | Admitting: Vascular Surgery

## 2016-09-19 DIAGNOSIS — Z1231 Encounter for screening mammogram for malignant neoplasm of breast: Secondary | ICD-10-CM

## 2016-09-25 ENCOUNTER — Ambulatory Visit (INDEPENDENT_AMBULATORY_CARE_PROVIDER_SITE_OTHER): Payer: Medicare Other | Admitting: Physician Assistant

## 2016-09-25 ENCOUNTER — Ambulatory Visit (HOSPITAL_COMMUNITY)
Admission: RE | Admit: 2016-09-25 | Discharge: 2016-09-25 | Disposition: A | Discharge status: Home / Self Care | Payer: Medicare Other | Source: Ambulatory Visit | Attending: Family | Admitting: Family

## 2016-09-25 VITALS — BP 131/77 | HR 53 | Temp 97.2°F | Resp 16 | Ht 65.0 in | Wt 163.0 lb

## 2016-09-25 DIAGNOSIS — I6522 Occlusion and stenosis of left carotid artery: Secondary | ICD-10-CM | POA: Diagnosis not present

## 2016-09-25 DIAGNOSIS — I6529 Occlusion and stenosis of unspecified carotid artery: Secondary | ICD-10-CM | POA: Diagnosis not present

## 2016-09-25 LAB — VAS US CAROTID
LCCADSYS: 422 cm/s
LEFT ECA DIAS: -11 cm/s
LEFT VERTEBRAL DIAS: 14 cm/s
LICADSYS: -63 cm/s
Left CCA dist dias: 87 cm/s
Left CCA prox dias: 17 cm/s
Left CCA prox sys: 96 cm/s
Left ICA dist dias: -20 cm/s
Left ICA prox dias: -25 cm/s
Left ICA prox sys: -228 cm/s
RCCADSYS: -138 cm/s
RCCAPDIAS: 15 cm/s
RIGHT CCA MID DIAS: 14 cm/s
RIGHT ECA DIAS: -8 cm/s
RIGHT VERTEBRAL DIAS: 21 cm/s
Right CCA prox sys: 83 cm/s

## 2016-09-25 NOTE — Progress Notes (Signed)
History of Present Illness:  Patient is a 82 y.o. year old female who presents for evaluation of carotid stenosis s/p left CEA 2010 by Dr. Hart Rochester.  The patient denies symptoms of TIA, amaurosis, or stroke.  The patient is currently on aspirin 81 mg antiplatelet therapy, and anti hypertensives.   She reports no health or medication changes since her last visit.  Past Medical History:  Diagnosis Date  . Arthritis   . Carotid artery occlusion   . Colitis   . COPD (chronic obstructive pulmonary disease) (HCC)   . Diverticulitis   . Hypertension   . Keratosis, seborrheic     Past Surgical History:  Procedure Laterality Date  . ABDOMINAL HYSTERECTOMY    . BREAST SURGERY     cyst removal  . CAROTID ENDARTERECTOMY Left 2010  . CHOLECYSTECTOMY    . EYE SURGERY Bilateral 06/2014   cataracts  . JOINT REPLACEMENT     RIGHT KNEE  . REPLACEMENT TOTAL KNEE Right      Social History Social History  Substance Use Topics  . Smoking status: Former Smoker    Years: 50.00    Types: Cigarettes    Quit date: 02/04/2002  . Smokeless tobacco: Never Used  . Alcohol use No    Family History Family History  Problem Relation Age of Onset  . Heart disease Mother        After age 57  . AAA (abdominal aortic aneurysm) Sister   . AAA (abdominal aortic aneurysm) Brother   . Heart attack Brother     Allergies  Allergies  Allergen Reactions  . Amoxicillin Nausea Only and Rash    Has patient had a PCN reaction causing immediate rash, facial/tongue/throat swelling, SOB or lightheadedness with hypotension: Yes Has patient had a PCN reaction causing severe rash involving mucus membranes or skin necrosis: No Has patient had a PCN reaction that required hospitalization No Has patient had a PCN reaction occurring within the last 10 years: No If all of the above answers are "NO", then may proceed with Cephalosporin use.   . Tetracyclines & Related Nausea Only and Rash     Current  Outpatient Prescriptions  Medication Sig Dispense Refill  . acetaminophen (TYLENOL) 500 MG tablet Take 1,000 mg by mouth every 6 (six) hours as needed for mild pain, moderate pain, fever or headache.    Marland Kitchen amLODipine (NORVASC) 5 MG tablet Take 5 mg by mouth daily with breakfast.     . aspirin EC 81 MG tablet Take 81 mg by mouth at bedtime.    Marland Kitchen atenolol (TENORMIN) 25 MG tablet Take 25 mg by mouth daily with breakfast.   3  . Coral Calcium (SM CORAL CALCIUM) 1000 (390 Ca) MG TABS Take 1,000 mg by mouth at bedtime.    Marland Kitchen LORazepam (ATIVAN) 1 MG tablet Take 0.5-1 mg by mouth daily. Takes 0.5mg  right when she gets up for the day and then a little while later she takes 1mg     . losartan (COZAAR) 100 MG tablet Take 100 mg by mouth daily with breakfast.     . nitroGLYCERIN (NITRODUR - DOSED IN MG/24 HR) 0.2 mg/hr patch Apply 1/4 patch to affected area daily.  Change patch daily and slightly alter location daily. 24 patch 1  . omeprazole (PRILOSEC) 40 MG capsule Take 40 mg by mouth daily with breakfast.     . sertraline (ZOLOFT) 100 MG tablet Take 50 mg by mouth at bedtime.    Marland Kitchen  sodium chloride (OCEAN) 0.65 % SOLN nasal spray Place 1 spray into both nostrils 2 (two) times daily.    . diphenhydrAMINE (BENADRYL) 25 MG tablet Take 25 mg by mouth every 6 (six) hours as needed for allergies or sleep.    Marland Kitchen. MYRBETRIQ 50 MG TB24 tablet Take 50 mg by mouth daily with breakfast.   12  . ondansetron (ZOFRAN ODT) 8 MG disintegrating tablet Take 1 tablet (8 mg total) by mouth every 8 (eight) hours as needed for nausea or vomiting. (Patient not taking: Reported on 09/25/2016) 10 tablet 0   No current facility-administered medications for this visit.     ROS:   General:  No weight loss, Fever, chills  HEENT: No recent headaches, no nasal bleeding, no visual changes, no sore throat  Neurologic: No dizziness, blackouts, seizures. No recent symptoms of stroke or mini- stroke. No recent episodes of slurred speech, or  temporary blindness.  Cardiac: No recent episodes of chest pain/pressure, no shortness of breath at rest.  No shortness of breath with exertion.  Denies history of atrial fibrillation or irregular heartbeat  Vascular: No history of rest pain in feet.  No history of claudication.  No history of non-healing ulcer, No history of DVT   Pulmonary: No home oxygen, no productive cough, no hemoptysis,  No asthma or wheezing  Musculoskeletal:  [x ] Arthritis, [ ]  Low back pain,  [ x] Joint pain  Hematologic:No history of hypercoagulable state.  No history of easy bleeding.  No history of anemia  Gastrointestinal: No hematochezia or melena,  No gastroesophageal reflux, no trouble swallowing  Urinary: [ ]  chronic Kidney disease, [ ]  on HD - [ ]  MWF or [ ]  TTHS, [ ]  Burning with urination, [ ]  Frequent urination, [ ]  Difficulty urinating;   Skin: No rashes  Psychological: positive history of anxiety,  No history of depression   Physical Examination  Vitals:   09/25/16 1339 09/25/16 1341  BP: 134/75 131/77  Pulse: (!) 53 (!) 53  Resp: 16   Temp: (!) 97.2 F (36.2 C)   SpO2: 96%   Weight: 163 lb (73.9 kg)   Height: 5\' 5"  (1.651 m)     Body mass index is 27.12 kg/m.  General:  Alert and oriented, no acute distress HEENT: Normal Neck: positive left bruit or JVD Pulmonary: Clear to auscultation bilaterally Cardiac: Regular Rate and Rhythm without murmur Gastrointestinal: Soft, non-tender, non-distended, no mass, no scars Skin: No rash Extremity Pulses:  2+ radial, brachial, femoral, dorsalis pedis, posterior tibial pulses bilaterally Musculoskeletal: No deformity or edema  Neurologic: Upper and lower extremity motor 5/5 and symmetric  DATA:  Carotid duplex  Right < 39% stenosis Left ICA velocity 237 cm/s 50% stenosis   ASSESSMENT:  Follow up carotid stenosis surveillance s/p left CEA 2010   PLAN: She reports no signs or symptoms of stroke.  Her carotid duplex reveals a  patent left CEA with mild stenosis of 50%.  No change since her last visit 09/21/2015.  We reviewed symptoms of stroke and she stated she understood.  We will schedule her to return in 1 year for repeat carotid duplex.  Jenel Gierke MAUREEN PA-C [facility]

## 2016-09-26 NOTE — Addendum Note (Signed)
Addended by: Burton ApleyPETTY, Leopold Smyers A on: 09/26/2016 03:56 PM   Modules accepted: Orders

## 2016-09-27 ENCOUNTER — Ambulatory Visit
Admission: RE | Admit: 2016-09-27 | Discharge: 2016-09-27 | Disposition: A | Discharge status: Home / Self Care | Payer: Medicare Other | Source: Ambulatory Visit | Attending: Family Medicine | Admitting: Family Medicine

## 2016-09-27 DIAGNOSIS — Z1231 Encounter for screening mammogram for malignant neoplasm of breast: Secondary | ICD-10-CM

## 2017-01-26 ENCOUNTER — Encounter (HOSPITAL_COMMUNITY): Payer: Self-pay

## 2017-01-26 ENCOUNTER — Emergency Department (HOSPITAL_COMMUNITY): Payer: Medicare Other

## 2017-01-26 ENCOUNTER — Other Ambulatory Visit: Payer: Self-pay

## 2017-01-26 ENCOUNTER — Inpatient Hospital Stay (HOSPITAL_COMMUNITY)
Admission: EM | Admit: 2017-01-26 | Discharge: 2017-01-30 | DRG: 194 | Disposition: A | Discharge status: Home / Self Care | Payer: Medicare Other | Attending: Internal Medicine | Admitting: Internal Medicine

## 2017-01-26 DIAGNOSIS — J440 Chronic obstructive pulmonary disease with acute lower respiratory infection: Secondary | ICD-10-CM | POA: Diagnosis present

## 2017-01-26 DIAGNOSIS — Z880 Allergy status to penicillin: Secondary | ICD-10-CM

## 2017-01-26 DIAGNOSIS — I480 Paroxysmal atrial fibrillation: Secondary | ICD-10-CM | POA: Diagnosis not present

## 2017-01-26 DIAGNOSIS — Z7982 Long term (current) use of aspirin: Secondary | ICD-10-CM | POA: Diagnosis not present

## 2017-01-26 DIAGNOSIS — Z87891 Personal history of nicotine dependence: Secondary | ICD-10-CM

## 2017-01-26 DIAGNOSIS — Z9049 Acquired absence of other specified parts of digestive tract: Secondary | ICD-10-CM

## 2017-01-26 DIAGNOSIS — K56609 Unspecified intestinal obstruction, unspecified as to partial versus complete obstruction: Secondary | ICD-10-CM | POA: Diagnosis present

## 2017-01-26 DIAGNOSIS — Z96651 Presence of right artificial knee joint: Secondary | ICD-10-CM | POA: Diagnosis present

## 2017-01-26 DIAGNOSIS — Z7952 Long term (current) use of systemic steroids: Secondary | ICD-10-CM | POA: Diagnosis not present

## 2017-01-26 DIAGNOSIS — Z881 Allergy status to other antibiotic agents status: Secondary | ICD-10-CM | POA: Diagnosis not present

## 2017-01-26 DIAGNOSIS — K56600 Partial intestinal obstruction, unspecified as to cause: Secondary | ICD-10-CM | POA: Diagnosis present

## 2017-01-26 DIAGNOSIS — N179 Acute kidney failure, unspecified: Secondary | ICD-10-CM | POA: Diagnosis present

## 2017-01-26 DIAGNOSIS — K567 Ileus, unspecified: Secondary | ICD-10-CM | POA: Diagnosis not present

## 2017-01-26 DIAGNOSIS — Z8249 Family history of ischemic heart disease and other diseases of the circulatory system: Secondary | ICD-10-CM

## 2017-01-26 DIAGNOSIS — K52831 Collagenous colitis: Secondary | ICD-10-CM | POA: Diagnosis present

## 2017-01-26 DIAGNOSIS — J189 Pneumonia, unspecified organism: Secondary | ICD-10-CM

## 2017-01-26 DIAGNOSIS — Z90710 Acquired absence of both cervix and uterus: Secondary | ICD-10-CM | POA: Diagnosis not present

## 2017-01-26 DIAGNOSIS — I4891 Unspecified atrial fibrillation: Secondary | ICD-10-CM | POA: Diagnosis present

## 2017-01-26 DIAGNOSIS — Z0189 Encounter for other specified special examinations: Secondary | ICD-10-CM

## 2017-01-26 DIAGNOSIS — I10 Essential (primary) hypertension: Secondary | ICD-10-CM | POA: Diagnosis present

## 2017-01-26 LAB — CBC WITH DIFFERENTIAL/PLATELET
BASOS ABS: 0 10*3/uL (ref 0.0–0.1)
BASOS PCT: 0 %
EOS ABS: 0.1 10*3/uL (ref 0.0–0.7)
Eosinophils Relative: 1 %
HEMATOCRIT: 44.1 % (ref 36.0–46.0)
HEMOGLOBIN: 14.6 g/dL (ref 12.0–15.0)
Lymphocytes Relative: 8 %
Lymphs Abs: 1.5 10*3/uL (ref 0.7–4.0)
MCH: 29.4 pg (ref 26.0–34.0)
MCHC: 33.1 g/dL (ref 30.0–36.0)
MCV: 88.7 fL (ref 78.0–100.0)
MONOS PCT: 8 %
Monocytes Absolute: 1.4 10*3/uL — ABNORMAL HIGH (ref 0.1–1.0)
NEUTROS ABS: 15.7 10*3/uL — AB (ref 1.7–7.7)
NEUTROS PCT: 83 %
Platelets: 233 10*3/uL (ref 150–400)
RBC: 4.97 MIL/uL (ref 3.87–5.11)
RDW: 14.8 % (ref 11.5–15.5)
WBC: 18.7 10*3/uL — AB (ref 4.0–10.5)

## 2017-01-26 LAB — CREATININE, SERUM
CREATININE: 1.09 mg/dL — AB (ref 0.44–1.00)
GFR calc Af Amer: 54 mL/min — ABNORMAL LOW (ref >60)
GFR calc non Af Amer: 47 mL/min — ABNORMAL LOW (ref >60)

## 2017-01-26 LAB — CBC
HCT: 44.4 % (ref 36.0–46.0)
Hemoglobin: 14.5 g/dL (ref 12.0–15.0)
MCH: 29.2 pg (ref 26.0–34.0)
MCHC: 32.7 g/dL (ref 30.0–36.0)
MCV: 89.5 fL (ref 78.0–100.0)
PLATELETS: 204 10*3/uL (ref 150–400)
RBC: 4.96 MIL/uL (ref 3.87–5.11)
RDW: 14.7 % (ref 11.5–15.5)
WBC: 22.8 10*3/uL — ABNORMAL HIGH (ref 4.0–10.5)

## 2017-01-26 LAB — URINALYSIS, ROUTINE W REFLEX MICROSCOPIC
Bilirubin Urine: NEGATIVE
GLUCOSE, UA: NEGATIVE mg/dL
Ketones, ur: NEGATIVE mg/dL
NITRITE: NEGATIVE
PROTEIN: NEGATIVE mg/dL
SPECIFIC GRAVITY, URINE: 1.035 — AB (ref 1.005–1.030)
pH: 6 (ref 5.0–8.0)

## 2017-01-26 LAB — COMPREHENSIVE METABOLIC PANEL
ALBUMIN: 4 g/dL (ref 3.5–5.0)
ALK PHOS: 74 U/L (ref 38–126)
ALT: 14 U/L (ref 14–54)
ANION GAP: 12 (ref 5–15)
AST: 19 U/L (ref 15–41)
BILIRUBIN TOTAL: 0.9 mg/dL (ref 0.3–1.2)
BUN: 23 mg/dL — ABNORMAL HIGH (ref 6–20)
CALCIUM: 9.3 mg/dL (ref 8.9–10.3)
CO2: 28 mmol/L (ref 22–32)
Chloride: 96 mmol/L — ABNORMAL LOW (ref 101–111)
Creatinine, Ser: 1.09 mg/dL — ABNORMAL HIGH (ref 0.44–1.00)
GFR, EST AFRICAN AMERICAN: 54 mL/min — AB (ref >60)
GFR, EST NON AFRICAN AMERICAN: 47 mL/min — AB (ref >60)
Glucose, Bld: 150 mg/dL — ABNORMAL HIGH (ref 65–99)
POTASSIUM: 3.5 mmol/L (ref 3.5–5.1)
Sodium: 136 mmol/L (ref 135–145)
TOTAL PROTEIN: 7.3 g/dL (ref 6.5–8.1)

## 2017-01-26 LAB — LIPASE, BLOOD: LIPASE: 23 U/L (ref 11–51)

## 2017-01-26 MED ORDER — LORAZEPAM 0.5 MG PO TABS
0.5000 mg | ORAL_TABLET | Freq: Every day | ORAL | Status: DC
Start: 2017-01-27 — End: 2017-01-30
  Administered 2017-01-28 – 2017-01-30 (×3): 0.5 mg via ORAL
  Filled 2017-01-26 (×3): qty 1

## 2017-01-26 MED ORDER — SENNOSIDES 8.8 MG/5ML PO SYRP
5.0000 mL | ORAL_SOLUTION | Freq: Two times a day (BID) | ORAL | Status: DC
  Administered 2017-01-27 – 2017-01-29 (×5): 5 mL via ORAL
  Filled 2017-01-26 (×8): qty 5

## 2017-01-26 MED ORDER — SODIUM CHLORIDE 0.9 % IJ SOLN
INTRAMUSCULAR | Status: AC
  Filled 2017-01-26: qty 50

## 2017-01-26 MED ORDER — LEVOFLOXACIN IN D5W 750 MG/150ML IV SOLN
750.0000 mg | INTRAVENOUS | Status: DC
  Administered 2017-01-27: 750 mg via INTRAVENOUS
  Filled 2017-01-26 (×2): qty 150

## 2017-01-26 MED ORDER — SERTRALINE HCL 100 MG PO TABS
100.0000 mg | ORAL_TABLET | Freq: Every day | ORAL | Status: DC
Start: 2017-01-26 — End: 2017-01-30
  Administered 2017-01-26 – 2017-01-29 (×4): 100 mg via ORAL
  Filled 2017-01-26 (×4): qty 1

## 2017-01-26 MED ORDER — ONDANSETRON HCL 4 MG/2ML IJ SOLN
4.0000 mg | Freq: Once | INTRAMUSCULAR | Status: AC
  Administered 2017-01-26: 4 mg via INTRAVENOUS
  Filled 2017-01-26: qty 2

## 2017-01-26 MED ORDER — MORPHINE SULFATE (PF) 4 MG/ML IV SOLN
4.0000 mg | Freq: Once | INTRAVENOUS | Status: AC
  Administered 2017-01-26: 4 mg via INTRAVENOUS
  Filled 2017-01-26: qty 1

## 2017-01-26 MED ORDER — ASPIRIN EC 81 MG PO TBEC
81.0000 mg | DELAYED_RELEASE_TABLET | Freq: Every day | ORAL | Status: DC
Start: 2017-01-26 — End: 2017-01-28
  Administered 2017-01-26 – 2017-01-27 (×2): 81 mg via ORAL
  Filled 2017-01-26 (×2): qty 1

## 2017-01-26 MED ORDER — PREDNISONE 5 MG PO TABS
10.0000 mg | ORAL_TABLET | Freq: Every day | ORAL | Status: DC
  Administered 2017-01-26 – 2017-01-30 (×5): 10 mg via ORAL
  Filled 2017-01-26 (×5): qty 2

## 2017-01-26 MED ORDER — LOSARTAN POTASSIUM 50 MG PO TABS
100.0000 mg | ORAL_TABLET | Freq: Every day | ORAL | Status: DC
  Administered 2017-01-26 – 2017-01-30 (×4): 100 mg via ORAL
  Filled 2017-01-26 (×5): qty 2

## 2017-01-26 MED ORDER — ONDANSETRON HCL 4 MG/2ML IJ SOLN
4.0000 mg | Freq: Four times a day (QID) | INTRAMUSCULAR | Status: DC | PRN
  Administered 2017-01-28: 4 mg via INTRAVENOUS
  Filled 2017-01-26: qty 2

## 2017-01-26 MED ORDER — BISACODYL 10 MG RE SUPP
10.0000 mg | Freq: Once | RECTAL | Status: AC
  Administered 2017-01-26: 10 mg via RECTAL
  Filled 2017-01-26: qty 1

## 2017-01-26 MED ORDER — SODIUM CHLORIDE 0.9 % IV BOLUS (SEPSIS)
500.0000 mL | Freq: Once | INTRAVENOUS | Status: AC
  Administered 2017-01-26: 500 mL via INTRAVENOUS

## 2017-01-26 MED ORDER — LEVOFLOXACIN IN D5W 500 MG/100ML IV SOLN
500.0000 mg | Freq: Once | INTRAVENOUS | Status: AC
  Administered 2017-01-26: 500 mg via INTRAVENOUS
  Filled 2017-01-26: qty 100

## 2017-01-26 MED ORDER — ATENOLOL 25 MG PO TABS
25.0000 mg | ORAL_TABLET | Freq: Every day | ORAL | Status: DC
  Administered 2017-01-26 – 2017-01-30 (×5): 25 mg via ORAL
  Filled 2017-01-26 (×5): qty 1

## 2017-01-26 MED ORDER — IOPAMIDOL (ISOVUE-300) INJECTION 61%
INTRAVENOUS | Status: AC
  Filled 2017-01-26: qty 100

## 2017-01-26 MED ORDER — PANTOPRAZOLE SODIUM 40 MG PO TBEC
40.0000 mg | DELAYED_RELEASE_TABLET | Freq: Every day | ORAL | Status: DC
  Administered 2017-01-26 – 2017-01-30 (×5): 40 mg via ORAL
  Filled 2017-01-26 (×5): qty 1

## 2017-01-26 MED ORDER — ACETAMINOPHEN 325 MG PO TABS
650.0000 mg | ORAL_TABLET | Freq: Four times a day (QID) | ORAL | Status: DC | PRN
  Administered 2017-01-27 – 2017-01-30 (×3): 650 mg via ORAL
  Filled 2017-01-26 (×3): qty 2

## 2017-01-26 MED ORDER — ENOXAPARIN SODIUM 40 MG/0.4ML ~~LOC~~ SOLN
40.0000 mg | SUBCUTANEOUS | Status: DC
  Administered 2017-01-26: 40 mg via SUBCUTANEOUS
  Filled 2017-01-26: qty 0.4

## 2017-01-26 MED ORDER — LORAZEPAM 1 MG PO TABS
1.0000 mg | ORAL_TABLET | Freq: Every day | ORAL | Status: DC
  Administered 2017-01-27 – 2017-01-30 (×4): 1 mg via ORAL
  Filled 2017-01-26 (×4): qty 1

## 2017-01-26 MED ORDER — IOPAMIDOL (ISOVUE-300) INJECTION 61%
100.0000 mL | Freq: Once | INTRAVENOUS | Status: AC | PRN
  Administered 2017-01-26: 100 mL via INTRAVENOUS

## 2017-01-26 MED ORDER — ALBUTEROL SULFATE (2.5 MG/3ML) 0.083% IN NEBU
2.5000 mg | INHALATION_SOLUTION | RESPIRATORY_TRACT | Status: DC | PRN
  Filled 2017-01-26: qty 3

## 2017-01-26 MED ORDER — SALINE SPRAY 0.65 % NA SOLN
1.0000 | Freq: Two times a day (BID) | NASAL | Status: DC
  Administered 2017-01-26 – 2017-01-29 (×7): 1 via NASAL
  Filled 2017-01-26: qty 44

## 2017-01-26 MED ORDER — SODIUM CHLORIDE 0.9 % IV SOLN
INTRAVENOUS | Status: DC
  Administered 2017-01-26: 09:00:00 via INTRAVENOUS

## 2017-01-26 MED ORDER — AMLODIPINE BESYLATE 5 MG PO TABS
5.0000 mg | ORAL_TABLET | Freq: Every day | ORAL | Status: DC
  Administered 2017-01-26 – 2017-01-30 (×5): 5 mg via ORAL
  Filled 2017-01-26 (×5): qty 1

## 2017-01-26 MED ORDER — SODIUM CHLORIDE 0.9 % IV SOLN
INTRAVENOUS | Status: DC
  Administered 2017-01-26 – 2017-01-30 (×6): via INTRAVENOUS

## 2017-01-26 NOTE — Progress Notes (Signed)
PHARMACY NOTE:  ANTIMICROBIAL RENAL DOSAGE ADJUSTMENT  Current antimicrobial regimen includes a mismatch between antimicrobial dosage and estimated renal function.  As per policy approved by the Pharmacy & Therapeutics and Medical Executive Committees, the antimicrobial dosage will be adjusted accordingly.  Current antimicrobial dosage:  Levaquin 750mg  IV q24h x 5 days  Indication: CAP  Renal Function: CrCl 41 ml/min  Estimated Creatinine Clearance: 41.1 mL/min (A) (by C-G formula based on SCr of 1.09 mg/dL (H)). []      On intermittent HD, scheduled: []      On CRRT    Antimicrobial dosage has been changed to:  750mg  IV q48h x 2 doses starting 1/6 (will cover 5 days since 500mg  given 1/5)  Additional comments:   Thank you for allowing pharmacy to be a part of this patient's care.  Loralee PacasErin Clothilde Tippetts, PharmD, BCPS Pager: [phone] 01/26/2017 4:04 PM

## 2017-01-26 NOTE — H&P (Addendum)
History and Physical    [name]:[id number]:[id number] DOB: 06-19-1934 DOA: 01/26/2017  Referring Frey/NP/PA: Dr. Lynelle DoctorKnapp  PCP: [name]   Patient coming from: coming from home  Chief Complaint:   HPI: [name] is a 83 y.o. female with medical history significant of hypertension, COPD, presented to emergency department with main concern of several days duration of progressively worsening nausea and multiple episodes of non bloody vomiting in the past 24 hours. Patient reports this was associated with diffuse abdominal pain, throbbing and constant, 7/10 in severity, non radiating, worse with drinking or eating, no specific alleviating factors. She was unable to keep anything down. Patient also reports noticing exertional dyspnea and mixed episodes of nonproductive and productive cough of clear sputum that started one day prior to this admission. Patient denies chest pain, no fevers or chills, no urinary concerns. Patient also denies any headaches or visual changes, no focal neurological symptoms such as numbness or tingling, no focal weakness.  In emergency department, patient noted to be hemodynamically stable, vital signs notable for blood pressure 154/66, otherwise unremarkable. Blood work notable for WBC 18.7, BUN 23, creatinine 1.09.abdominal and chest x-ray notable for mild patchy ? Left lower lobe pneumonia versus atelectasis with no evidence of small bowel obstruction or free air. CT abdomen and pelvis concerning for early or partial small bowel obstruction and ? Right lower lobe opacity question aspiration versus infection. TRH asked to admit for further evaluation.   Review of Systems:  Constitutional: Negative for fever, chills, diaphoresis HENT: Negative for ear pain, nosebleeds, congestion, facial swelling, rhinorrhea, neck pain, neck stiffness and ear discharge.   Eyes: Negative for pain, discharge, redness, itching and visual disturbance.  Respiratory: positive for  exertional dyspnea and mixed episodes of nonproductive nonproductive cough Cardiovascular: Negative for chest pain, palpitations and leg swelling.  Gastrointestinal: positive for nausea and nonbloody vomiting Genitourinary: Negative for dysuria, urgency, frequency, hematuria, flank pain, decreased urine volume, difficulty urinating and dyspareunia.  Musculoskeletal: Negative for back pain, joint swelling, arthralgias and gait problem.  Neurological: Negative for dizziness, tremors, seizures, syncope, facial asymmetry, speech difficulty, weakness, light-headedness, numbness and headaches.  Hematological: Negative for adenopathy. Does not bruise/bleed easily.  Psychiatric/Behavioral: Negative for hallucinations, behavioral problems, confusion, dysphoric mood, decreased concentration and agitation.   Past Medical History:  Diagnosis Date  . Arthritis   . Carotid artery occlusion   . Colitis   . COPD (chronic obstructive pulmonary disease) (HCC)   . Diverticulitis   . Hypertension   . Keratosis, seborrheic     Past Surgical History:  Procedure Laterality Date  . ABDOMINAL HYSTERECTOMY    . BREAST SURGERY     cyst removal  . CAROTID ENDARTERECTOMY Left 2010  . CHOLECYSTECTOMY    . EYE SURGERY Bilateral 06/2014   cataracts  . JOINT REPLACEMENT     RIGHT KNEE  . REPLACEMENT TOTAL KNEE Right    Social history:  reports that she quit smoking about 14 years ago. Her smoking use included cigarettes. She quit after 50.00 years of use. she has never used smokeless tobacco. She reports that she does not drink alcohol or use drugs.  Allergies  Allergen Reactions  . Amoxicillin Nausea Only and Rash    Has patient had a PCN reaction causing immediate rash, facial/tongue/throat swelling, SOB or lightheadedness with hypotension: Yes Has patient had a PCN reaction causing severe rash involving mucus membranes or skin necrosis: No Has patient had a PCN reaction that required hospitalization  No Has patient had a PCN reaction occurring within the last 10 years: No If all of the above answers are "NO", then may proceed with Cephalosporin use.   . Tetracyclines & Related Nausea Only and Rash    Family History  Problem Relation Age of Onset  . Heart disease Mother        After age 80  . AAA (abdominal aortic aneurysm) Sister   . AAA (abdominal aortic aneurysm) Brother   . Heart attack Brother   . Breast cancer Neg Hx     Medication Sig  amLODipine (NORVASC) 5 MG tablet Take 5 mg by mouth daily with breakfast.   aspirin EC 81 MG tablet Take 81 mg by mouth at bedtime.  atenolol (TENORMIN) 25 MG tablet Take 25 mg by mouth daily with breakfast.   LORazepam (ATIVAN) 1 MG tablet Take 0.5-1 mg by mouth 2 (two) times daily. Takes 0.5mg  right when she gets up for the day and then a little while later she takes 1mg   losartan (COZAAR) 100 MG tablet Take 100 mg by mouth daily with breakfast.   omeprazole (PRILOSEC) 40 MG capsule Take 40 mg by mouth daily with breakfast.   predniSONE (DELTASONE) 10 MG tablet Take 10 mg by mouth daily.   sertraline (ZOLOFT) 100 MG tablet Take 100 mg by mouth at bedtime.     Physical Exam: Vitals:   01/26/17 0840 01/26/17 0842 01/26/17 1055 01/26/17 1225  BP: (!) 185/55  (!) 144/68 (!) 154/66  Pulse: 71  87 77  Resp: 18  16 16   Temp: 97.9 F (36.6 C)     TempSrc: Oral     SpO2: 94%  95% 96%  Weight:  72.6 kg (160 lb)    Height:  5\' 5"  (1.651 m)      Constitutional: NAD, calm, comfortable Vitals:   01/26/17 0840 01/26/17 0842 01/26/17 1055 01/26/17 1225  BP: (!) 185/55  (!) 144/68 (!) 154/66  Pulse: 71  87 77  Resp: 18  16 16   Temp: 97.9 F (36.6 C)     TempSrc: Oral     SpO2: 94%  95% 96%  Weight:  72.6 kg (160 lb)    Height:  5\' 5"  (1.651 m)     Eyes: PERRL, lids and conjunctivae normal ENMT: Mucous membranes are moist. Posterior pharynx clear of any exudate or lesions.Normal dentition.  Neck: normal, supple, no masses, no  thyromegaly Respiratory: rhonchi at bases, no wheezing or tachypnea Cardiovascular: Regular rate and rhythm, no rubs / gallops. No extremity edema. 2+ pedal pulses. No carotid bruits.  Abdomen: soft but slightly tender in epigastric area, hypoactive bowel sounds Musculoskeletal: no clubbing / cyanosis. No joint deformity upper and lower extremities. Good ROM, no contractures. Normal muscle tone.  Skin: no rashes, lesions, ulcers. No induration Neurologic: CN 2-12 grossly intact. Sensation intact, DTR normal. Strength 5/5 in all 4.  Psychiatric: Normal judgment and insight. Alert and oriented x 3. Normal mood.   Labs on Admission: I have personally reviewed following labs and imaging studies  CBC: Recent Labs  Lab 01/26/17 0949  WBC 18.7*  NEUTROABS 15.7*  HGB 14.6  HCT 44.1  MCV 88.7  PLT 233   Basic Metabolic Panel: Recent Labs  Lab 01/26/17 0949  NA 136  K 3.5  CL 96*  CO2 28  GLUCOSE 150*  BUN 23*  CREATININE 1.09*  CALCIUM 9.3   Liver Function Tests: Recent Labs  Lab 01/26/17 0949  AST 19  ALT  14  ALKPHOS 74  BILITOT 0.9  PROT 7.3  ALBUMIN 4.0   Recent Labs  Lab 01/26/17 0949  LIPASE 23   Urine analysis:    Component Value Date/Time   COLORURINE YELLOW 10/24/2015 1043   APPEARANCEUR CLEAR 10/24/2015 1043   LABSPEC 1.015 10/24/2015 1043   PHURINE 6.0 10/24/2015 1043   GLUCOSEU NEGATIVE 10/24/2015 1043   HGBUR TRACE (A) 10/24/2015 1043   BILIRUBINUR NEGATIVE 10/24/2015 1043   KETONESUR NEGATIVE 10/24/2015 1043   PROTEINUR NEGATIVE 10/24/2015 1043   UROBILINOGEN 0.2 10/20/2008 0842   NITRITE NEGATIVE 10/24/2015 1043   LEUKOCYTESUR NEGATIVE 10/24/2015 1043   Radiological Exams on Admission: Ct Abdomen Pelvis W Contrast Result Date: 01/26/2017 Multiple fluid filled mildly dilated loops of small bowel throughout the central abdomen transition to decompressed small bowel within the anterior lower abdomen. Findings may represent early and/or partial  small bowel obstruction. Additionally there is a small amount of fluid within the small bowel mesentery and a moderate amount of fluid within the pelvis. 2. Atherosclerotic narrowing at the origin of the superior mesenteric artery. 3. Patchy ground-glass opacities within the medial right lower lobe may represent aspiration or infection. 4. Mild central intrahepatic biliary ductal dilatation which may be physiologic status post cholecystectomy. Recommend correlation with LFTs.   Dg Abd Acute W/chest Result Date: 01/26/2017 Mild patchy left lower lobe opacities, atelectasis versus pneumonia. No evidence of small bowel obstruction or free air.  EKG: pending   Assessment/Plan:  Active Problems: Dyspnea, hx of COPD - Unclear etiology and based on clinical symptoms and imaging studies, concern is developing pneumonia in bilateral lower lobes - pt has underlying COPD but no wheezing noted on exam at this time - Agree with treating with empiric antibiotics, initiating pneumonia order set - Following tests will have to be followed; sputum and blood culture, urine Legionella and strep pneumo - Narrow down antibiotics as clinically indicated - Provide antitussives as needed - allow bronchodilators as needed   Nausea and vomiting - Concern for early versus partial small bowel obstruction - Currently no vomiting and patient reluctant to get NG tube placed - I think it's reasonable to allow bowel rest, monitoring for recurrent vomiting - We'll provide IV fluids and antiemetics as needed - If recurrent vomiting, plan to place NG tube and consider surgical consultation - also place on bowel regimen   Leukocytosis - Suspect this is reactive and secondary to ? pneumonia versus partial small bowel obstruction - Antibiotics as noted above and repeat CBC in the morning   Mild AKI - Suspect prerenal etiology - IV fluids will be started and will repeat BMP in the morning  Hypertension - Continue home  medical regimen with amlodipine, atenolol, losartan   DVT prophylaxis: Lovenox SQ Code Status: Full  Family Communication: Pt updated at bedside Disposition Plan: to be determined  Consults called: none Admission status: inpatient   Debbora Presto Frey [facility] Pager [phone]  If 7PM-7AM, please contact night-coverage [email or web address] Password [email or web address]  01/26/2017, 1:11 PM

## 2017-01-26 NOTE — ED Triage Notes (Signed)
EMS reports from home abdominal pain nausea vomiting, since yesterday, 8mg  zofran enroute.  BP 153/76 HR 67 sp02 98 RA CBG 186  20ga LAC

## 2017-01-26 NOTE — ED Provider Notes (Signed)
[facility]-EMERGENCY DEPT Provider Note   CSN: [id number] Arrival date & time: 01/26/17  4098     History   Chief Complaint Chief Complaint  Patient presents with  . Abdominal Pain  . Nausea  . Emesis    HPI [name] is a 83 y.o. female.  HPI Patient presents to the emergency room for evaluation of nausea vomiting and abdominal pain.  Patient states yesterday she started having trouble with nausea and vomiting.  She began having diffuse abdominal pain.  Patient states she was vomiting throughout the night and was not able to get comfortable.  She had one loose stool but has not had diarrhea.  She has not been able to keep anything down.  Her symptoms progressed so she came to the emergency room.  She has been coughing.  That started yesterday as well.  She denies any fevers.  No dysuria.  No frequent history of abdominal pain problems.  She has had several surgeries including an appendectomy hysterectomy and cholecystectomy. Past Medical History:  Diagnosis Date  . Arthritis   . Carotid artery occlusion   . Colitis   . COPD (chronic obstructive pulmonary disease) (HCC)   . Diverticulitis   . Hypertension   . Keratosis, seborrheic     Patient Active Problem List   Diagnosis Date Noted  . Encounter for postoperative carotid endarterectomy surveillance 09/21/2015  . Occlusion and stenosis of carotid artery without mention of cerebral infarction 02/05/2012    Past Surgical History:  Procedure Laterality Date  . ABDOMINAL HYSTERECTOMY    . BREAST SURGERY     cyst removal  . CAROTID ENDARTERECTOMY Left 2010  . CHOLECYSTECTOMY    . EYE SURGERY Bilateral 06/2014   cataracts  . JOINT REPLACEMENT     RIGHT KNEE  . REPLACEMENT TOTAL KNEE Right     OB History    No data available       Home Medications    Prior to Admission medications   Medication Sig Start Date End Date Taking? Authorizing Provider  acetaminophen (TYLENOL) 500 MG tablet  Take 1,000 mg by mouth every 6 (six) hours as needed for mild pain, moderate pain, fever or headache.   Yes [provider]  amLODipine (NORVASC) 5 MG tablet Take 5 mg by mouth daily with breakfast.    Yes [provider]  aspirin EC 81 MG tablet Take 81 mg by mouth at bedtime.   Yes [provider]  atenolol (TENORMIN) 25 MG tablet Take 25 mg by mouth daily with breakfast.  07/27/14  Yes [provider]  Coral Calcium (SM CORAL CALCIUM) 1000 (390 Ca) MG TABS Take 1,000 mg by mouth at bedtime.   Yes [provider]  LORazepam (ATIVAN) 1 MG tablet Take 0.5-1 mg by mouth 2 (two) times daily. Takes 0.5mg  right when she gets up for the day and then a little while later she takes 1mg    Yes [provider]  losartan (COZAAR) 100 MG tablet Take 100 mg by mouth daily with breakfast.    Yes [provider]  omeprazole (PRILOSEC) 40 MG capsule Take 40 mg by mouth daily with breakfast.    Yes [provider]  predniSONE (DELTASONE) 10 MG tablet Take 10 mg by mouth daily.  12/17/16  Yes [provider]  sertraline (ZOLOFT) 100 MG tablet Take 100 mg by mouth at bedtime.    Yes [provider]  sodium chloride (OCEAN) 0.65 % SOLN nasal  spray Place 1 spray into both nostrils 2 (two) times daily.   Yes [provider]  nitroGLYCERIN (NITRODUR - DOSED IN MG/24 HR) 0.2 mg/hr patch Apply 1/4 patch to affected area daily.  Change patch daily and slightly alter location daily. Patient not taking: Reported on 01/26/2017 12/13/15   Andrena Mews, DO  ondansetron (ZOFRAN ODT) 8 MG disintegrating tablet Take 1 tablet (8 mg total) by mouth every 8 (eight) hours as needed for nausea or vomiting. Patient not taking: Reported on 09/25/2016 10/24/15   Cathren Laine, MD    Family History Family History  Problem Relation Age of Onset  . Heart disease Mother        After age 19  . AAA (abdominal aortic aneurysm) Sister   . AAA  (abdominal aortic aneurysm) Brother   . Heart attack Brother   . Breast cancer Neg Hx     Social History Social History   Tobacco Use  . Smoking status: Former Smoker    Years: 50.00    Types: Cigarettes    Last attempt to quit: 02/04/2002    Years since quitting: 14.9  . Smokeless tobacco: Never Used  Substance Use Topics  . Alcohol use: No  . Drug use: No     Allergies   Amoxicillin and Tetracyclines & related   Review of Systems Review of Systems  All other systems reviewed and are negative.    Physical Exam Updated Vital Signs BP (!) 144/68   Pulse 87   Temp 97.9 F (36.6 C) (Oral)   Resp 16   Ht 1.651 m (5\' 5" )   Wt 72.6 kg (160 lb)   SpO2 95%   BMI 26.63 kg/m   Physical Exam  Constitutional: She appears well-developed and well-nourished. No distress.  HENT:  Head: Normocephalic and atraumatic.  Right Ear: External ear normal.  Left Ear: External ear normal.  Eyes: Conjunctivae are normal. Right eye exhibits no discharge. Left eye exhibits no discharge. No scleral icterus.  Neck: Neck supple. No tracheal deviation present.  Cardiovascular: Normal rate, regular rhythm and intact distal pulses.  Pulmonary/Chest: Effort normal and breath sounds normal. No stridor. No respiratory distress. She has no wheezes. She has no rales.  Abdominal: Soft. She exhibits no distension and no mass. Bowel sounds are decreased. There is generalized tenderness. There is no rebound and no guarding. No hernia.  Musculoskeletal: She exhibits no edema or tenderness.  Neurological: She is alert. She has normal strength. No cranial nerve deficit (no facial droop, extraocular movements intact, no slurred speech) or sensory deficit. She exhibits normal muscle tone. She displays no seizure activity. Coordination normal.  Skin: Skin is warm and dry. No rash noted.  Psychiatric: She has a normal mood and affect.  Nursing note and vitals reviewed.    ED Treatments / Results   Labs (all labs ordered are listed, but only abnormal results are displayed) Labs Reviewed  COMPREHENSIVE METABOLIC PANEL - Abnormal; Notable for the following components:      Result Value   Chloride 96 (*)    Glucose, Bld 150 (*)    BUN 23 (*)    Creatinine, Ser 1.09 (*)    GFR calc non Af Amer 47 (*)    GFR calc Af Amer 54 (*)    All other components within normal limits  CBC WITH DIFFERENTIAL/PLATELET - Abnormal; Notable for the following components:   WBC 18.7 (*)    Neutro Abs 15.7 (*)    Monocytes  Absolute 1.4 (*)    All other components within normal limits  LIPASE, BLOOD  URINALYSIS, ROUTINE W REFLEX MICROSCOPIC    EKG  EKG Interpretation None       Radiology Ct Abdomen Pelvis W Contrast  Result Date: 01/26/2017 CLINICAL DATA:  Abdominal pain. Nausea and vomiting. Patient with nausea and abdominal pain. EXAM: CT ABDOMEN AND PELVIS WITH CONTRAST TECHNIQUE: Multidetector CT imaging of the abdomen and pelvis was performed using the standard protocol following bolus administration of intravenous contrast. CONTRAST:  ISOVUE-300 IOPAMIDOL (ISOVUE-300) INJECTION 61% COMPARISON:  CT abdomen 03/29/2004. FINDINGS: Lower chest: Normal heart size. Subpleural opacities within the left-greater-than-right lower lobes favored to represent atelectasis. Patchy ground-glass opacities within the medial right lower lobe. Hepatobiliary: The liver is normal in size and contour. Patient status post cholecystectomy. Mild intrahepatic biliary ductal dilatation. Common bile duct measures 6 mm. Pancreas: Unremarkable Spleen: Unremarkable Adrenals/Urinary Tract: The adrenal glands are normal. Kidneys enhance symmetrically with contrast. No hydronephrosis. Urinary bladder is unremarkable. Stomach/Bowel: Descending and sigmoid colonic diverticulosis. No CT evidence for acute diverticulitis. Small hiatal hernia. Normal morphology of the stomach. Multiple fluid-filled and mildly dilated loops of small  bowel are demonstrated throughout the central abdomen with transition to decompressed small bowel within the central lower abdomen (image 60 ; series 2). There is a moderate amount of free fluid within the pelvis and small amount of fluid throughout the small bowel mesentery. Vascular/Lymphatic: Normal caliber abdominal aorta. Peripheral calcified atherosclerotic plaque. No retroperitoneal lymphadenopathy. There is atherosclerotic narrowing at the origin of the superior mesenteric artery (image 39; series 5). Reproductive: Uterus is surgically absent. Other: None. Musculoskeletal: Lumbar spine degenerative changes. No aggressive or acute appearing osseous lesions. IMPRESSION: 1. Multiple fluid filled mildly dilated loops of small bowel throughout the central abdomen transition to decompressed small bowel within the anterior lower abdomen. Findings may represent early and/or partial small bowel obstruction. Additionally there is a small amount of fluid within the small bowel mesentery and a moderate amount of fluid within the pelvis. 2. Atherosclerotic narrowing at the origin of the superior mesenteric artery. 3. Patchy ground-glass opacities within the medial right lower lobe may represent aspiration or infection. 4. Mild central intrahepatic biliary ductal dilatation which may be physiologic status post cholecystectomy. Recommend correlation with LFTs. Electronically Signed   By: Annia Belt M.D.   On: 01/26/2017 11:10   Dg Abd Acute W/chest  Result Date: 01/26/2017 CLINICAL DATA:  Vomiting, abdominal pain EXAM: DG ABDOMEN ACUTE W/ 1V CHEST COMPARISON:  09/28/2015 FINDINGS: Mild patchy left lower lobe opacities, atelectasis versus pneumonia. Mild right basilar opacities, likely atelectasis. No pleural effusion or pneumothorax. The heart is normal in size. Nonobstructive bowel gas pattern. No evidence of free air on the lateral decubitus view. Cholecystectomy clips. Degenerative changes of the visualized  thoracolumbar spine. IMPRESSION: Mild patchy left lower lobe opacities, atelectasis versus pneumonia. No evidence of small bowel obstruction or free air. Electronically Signed   By: Charline Bills M.D.   On: 01/26/2017 10:21    Procedures Procedures (including critical care time)  Medications Ordered in ED Medications  sodium chloride 0.9 % bolus 500 mL (0 mLs Intravenous Stopped 01/26/17 1031)    And  0.9 %  sodium chloride infusion ( Intravenous New Bag/Given 01/26/17 0912)  iopamidol (ISOVUE-300) 61 % injection (not administered)  sodium chloride 0.9 % injection (not administered)  levofloxacin (LEVAQUIN) IVPB 500 mg (not administered)  ondansetron (ZOFRAN) injection 4 mg (4 mg Intravenous Given 01/26/17 0912)  morphine  4 MG/ML injection 4 mg (4 mg Intravenous Given 01/26/17 0912)  iopamidol (ISOVUE-300) 61 % injection 100 mL (100 mLs Intravenous Contrast Given 01/26/17 1041)     Initial Impression / Assessment and Plan / ED Course  I have reviewed the triage vital signs and the nursing notes.  Pertinent labs & imaging results that were available during my care of the patient were reviewed by me and considered in my medical decision making (see chart for details).  Clinical Course as of Jan 26 1205  Sat Jan 26, 2017  1026 Increased wbc.   No obstruction on plain film.  Possible pna on cxr.  Will ct to evaluate further  [JK]  1155 Anaphylactic type rash listed for amox.  Will treat with levaquin for CAP.  [JK]  1205 CT scan findings reviewed.  C/w partial small bowel obstruction.  No vomiting now.  Will hold off on NGT.    [JK]    Clinical Course User Index [JK] Linwood DibblesKnapp, Migdalia Olejniczak, MD    Patient presented to the emergency room with complaints of nausea vomiting and abdominal pain.  She also started experiencing a cough.  Patient's laboratory tests were notable for an elevated white blood cell count.  Her x-ray suggest the possibility of pneumonia.  CT scan shows findings that are consistent  with a partial small bowel obstruction.  I think this correlates with the patient's symptoms.  She does have history of prior abdominal surgeries and has had primarily nausea and vomiting.  Her abdomen is currently not distended she is not vomiting here in the emergency room.  Hold nasogastric tube placement for now.  Plan on antibiotics, IV fluids and antinausea medications.  Serial exams.  I will will consult the medical service for admission  Final Clinical Impressions(s) / ED Diagnoses   Final diagnoses:  Community acquired pneumonia, unspecified laterality  Partial small bowel obstruction (HCC)     Linwood DibblesKnapp, Nikolai Wilczak, MD 01/26/17 1208

## 2017-01-26 NOTE — ED Notes (Signed)
Pt attempted to use the restroom but wasn't able to do so, pt dribbled in the female urinal, I let the pt know that when her fluids were finished we would try to ambulate her to the restroom.

## 2017-01-27 ENCOUNTER — Inpatient Hospital Stay (HOSPITAL_COMMUNITY): Payer: Medicare Other

## 2017-01-27 ENCOUNTER — Other Ambulatory Visit: Payer: Self-pay

## 2017-01-27 DIAGNOSIS — K56609 Unspecified intestinal obstruction, unspecified as to partial versus complete obstruction: Secondary | ICD-10-CM

## 2017-01-27 LAB — BASIC METABOLIC PANEL
Anion gap: 8 (ref 5–15)
BUN: 22 mg/dL — AB (ref 6–20)
CALCIUM: 8.3 mg/dL — AB (ref 8.9–10.3)
CO2: 26 mmol/L (ref 22–32)
Chloride: 104 mmol/L (ref 101–111)
Creatinine, Ser: 1.02 mg/dL — ABNORMAL HIGH (ref 0.44–1.00)
GFR calc Af Amer: 59 mL/min — ABNORMAL LOW (ref >60)
GFR, EST NON AFRICAN AMERICAN: 51 mL/min — AB (ref >60)
GLUCOSE: 119 mg/dL — AB (ref 65–99)
Potassium: 4.5 mmol/L (ref 3.5–5.1)
Sodium: 138 mmol/L (ref 135–145)

## 2017-01-27 LAB — CBC
HCT: 39.4 % (ref 36.0–46.0)
Hemoglobin: 12.4 g/dL (ref 12.0–15.0)
MCH: 28.6 pg (ref 26.0–34.0)
MCHC: 31.5 g/dL (ref 30.0–36.0)
MCV: 91 fL (ref 78.0–100.0)
Platelets: 213 10*3/uL (ref 150–400)
RBC: 4.33 MIL/uL (ref 3.87–5.11)
RDW: 15.2 % (ref 11.5–15.5)
WBC: 25.8 10*3/uL — ABNORMAL HIGH (ref 4.0–10.5)

## 2017-01-27 LAB — TSH: TSH: 0.678 u[IU]/mL (ref 0.350–4.500)

## 2017-01-27 LAB — HEPARIN LEVEL (UNFRACTIONATED): Heparin Unfractionated: 0.64 IU/mL (ref 0.30–0.70)

## 2017-01-27 LAB — APTT: aPTT: 36 seconds (ref 24–36)

## 2017-01-27 LAB — PROTIME-INR
INR: 1.31
Prothrombin Time: 16.2 seconds — ABNORMAL HIGH (ref 11.4–15.2)

## 2017-01-27 LAB — HIV ANTIBODY (ROUTINE TESTING W REFLEX): HIV SCREEN 4TH GENERATION: NONREACTIVE

## 2017-01-27 LAB — STREP PNEUMONIAE URINARY ANTIGEN: STREP PNEUMO URINARY ANTIGEN: NEGATIVE

## 2017-01-27 MED ORDER — DIATRIZOATE MEGLUMINE & SODIUM 66-10 % PO SOLN
90.0000 mL | Freq: Once | ORAL | Status: AC
  Administered 2017-01-27: 90 mL via ORAL
  Filled 2017-01-27: qty 90

## 2017-01-27 MED ORDER — HEPARIN (PORCINE) IN NACL 100-0.45 UNIT/ML-% IJ SOLN
950.0000 [IU]/h | INTRAMUSCULAR | Status: AC
  Administered 2017-01-27: 1000 [IU]/h via INTRAVENOUS
  Administered 2017-01-28: 950 [IU]/h via INTRAVENOUS
  Filled 2017-01-27 (×2): qty 250

## 2017-01-27 NOTE — Progress Notes (Signed)
Consult received, new onset afib who is rate controlled and hemodynamically stable on heparin, nonurgent cardiology consultation. Full consultation to be completed tomorrow, please call today with questions.    Dina RichJonathan Jhamari Markowicz MD

## 2017-01-27 NOTE — Progress Notes (Signed)
PROGRESS NOTE    Lori PontoCarol S Christen  OZD:[id number] DOB: 08/30/1934 DOA: 01/26/2017 PCP: Blair HeysEhinger, Robert, MD   Brief Narrative:  [name] is a 83 y.o. female with medical history significant of hypertension, COPD, presented to emergency department with main concern of several days duration of progressively worsening nausea and multiple episodes of non bloody vomiting in the past 24 hours. Patient reports this was associated with diffuse abdominal pain, throbbing and constant, 7/10 in severity, non radiating, worse with drinking or eating, no specific alleviating factors. She was unable to keep anything down. Patient also reports noticing exertional dyspnea and mixed episodes of nonproductive and productive cough of clear sputum that started one day prior to this admission. Patient denies chest pain, no fevers or chills, no urinary concerns. Patient also denies any headaches or visual changes, no focal neurological symptoms such as numbness or tingling, no focal weakness.  Assessment & Plan:   Active Problems:   SBO (small bowel obstruction) (HCC)   Dyspnea, hx of COPD  CAP - Mild patch LLL opacities on XR, atelectasis vs pneumonia and CT abd pelvis with contrast with medial R LL patchy ground glass opacities (aspiration vs infection) - pt has underlying COPD but no wheezing noted on exam at this time - Continue levaquin, ordered q 48  - Negative urine strep, pending legionella.  Pending sputum cx. - blood cx pending  Nausea and vomiting  Partial SBO - CT abdomen pelvis with multiple fluid filled mildly dilated loops throughout central abdomen, transiion to decompressed small bowel within anterior lower abdomen - concern for early or partial SBO (also fluid in pelvis and mesentery) - exam without tenderness - Will do small bowel protocol, hold NG for now - she's had some bm, no vomiting (advance diet vs surgical c/s prn results) - We'll provide IV fluids and antiemetics as needed - also place  on bowel regimen   Atrial Fibrillation: No previous history.  chadvasc 4.  Will start on heparin for now.  Echo, thyroid studies.  Likely induced with acute illness.  Will c/s cardiology.  Currently rate controlled.  Continue atenolol.    Leukocytosis - worsening, likely related to above - follow blood cx, continue abx   Mild AKI - ctm on IVF  Hypertension - Continue home medical regimen with amlodipine, atenolol, losartan  DVT prophylaxis: heparin gtt Code Status: full  Family Communication: none at bedside Disposition Plan: pending improvement   Consultants:   cardiology  Procedures:   None, echo and small bowel protocol pending  Antimicrobials:  Anti-infectives (From admission, onward)   Start     Dose/Rate Route Frequency Ordered Stop   01/27/17 1400  levofloxacin (LEVAQUIN) IVPB 750 mg     750 mg 100 mL/hr over 90 Minutes Intravenous Every 48 hours 01/26/17 1544 01/31/17 1359   01/26/17 1200  levofloxacin (LEVAQUIN) IVPB 500 mg     500 mg 100 mL/hr over 60 Minutes Intravenous  Once 01/26/17 1155 01/26/17 1451        Subjective: Presented with n/v since Friday. [facility]. Denies SOB or O2 at home.   No CP.  Objective: Vitals:   01/26/17 1959 01/26/17 2031 01/27/17 0518 01/27/17 0900  BP: (!) 110/41 (!) 152/57 (!) 125/50 121/74  Pulse: 82 97 66 96  Resp: 18 17 18    Temp: 99.9 F (37.7 C) 98.6 F (37 C) 98.1 F (36.7 C)   TempSrc: Oral Oral Oral   SpO2: 92% 92% 95%  Weight:  72.5 kg (159 lb 13.3 oz)    Height:  5\' 5"  (1.651 m)      Intake/Output Summary (Last 24 hours) at 01/27/2017 1023 Last data filed at 01/27/2017 0200 Gross per 24 hour  Intake 1181.25 ml  Output 152 ml  Net 1029.25 ml   Filed Weights   01/26/17 0842 01/26/17 2031  Weight: 72.6 kg (160 lb) 72.5 kg (159 lb 13.3 oz)    Examination:  General exam: Appears calm and comfortable  Respiratory system: 2 L [place], crackles at bases Cardiovascular system:  irregularly irregular S1 & S2 heard. No JVD, murmurs, rubs, gallops or clicks. No pedal edema. Gastrointestinal system: Quiet bowel sounds, nondistended, nontender. Central nervous system: Alert and oriented. No focal neurological deficits. Extremities: Symmetric 5 x 5 power. Skin: No rashes, lesions or ulcers Psychiatry: Judgement and insight appear normal. Mood & affect appropriate.     Data Reviewed: I have personally reviewed following labs and imaging studies  CBC: Recent Labs  Lab 01/26/17 0949 01/26/17 1621 01/27/17 0501  WBC 18.7* 22.8* 25.8*  NEUTROABS 15.7*  --   --   HGB 14.6 14.5 12.4  HCT 44.1 44.4 39.4  MCV 88.7 89.5 91.0  PLT 233 204 213   Basic Metabolic Panel: Recent Labs  Lab 01/26/17 0949 01/26/17 1621 01/27/17 0501  NA 136  --  138  K 3.5  --  4.5  CL 96*  --  104  CO2 28  --  26  GLUCOSE 150*  --  119*  BUN 23*  --  22*  CREATININE 1.09* 1.09* 1.02*  CALCIUM 9.3  --  8.3*   GFR: Estimated Creatinine Clearance: 43.9 mL/min (A) (by C-G formula based on SCr of 1.02 mg/dL (H)). Liver Function Tests: Recent Labs  Lab 01/26/17 0949  AST 19  ALT 14  ALKPHOS 74  BILITOT 0.9  PROT 7.3  ALBUMIN 4.0   Recent Labs  Lab 01/26/17 0949  LIPASE 23   No results for input(s): AMMONIA in the last 168 hours. Coagulation Profile: No results for input(s): INR, PROTIME in the last 168 hours. Cardiac Enzymes: No results for input(s): CKTOTAL, CKMB, CKMBINDEX, TROPONINI in the last 168 hours. BNP (last 3 results) No results for input(s): PROBNP in the last 8760 hours. HbA1C: No results for input(s): HGBA1C in the last 72 hours. CBG: No results for input(s): GLUCAP in the last 168 hours. Lipid Profile: No results for input(s): CHOL, HDL, LDLCALC, TRIG, CHOLHDL, LDLDIRECT in the last 72 hours. Thyroid Function Tests: No results for input(s): TSH, T4TOTAL, FREET4, T3FREE, THYROIDAB in the last 72 hours. Anemia Panel: No results for input(s):  VITAMINB12, FOLATE, FERRITIN, TIBC, IRON, RETICCTPCT in the last 72 hours. Sepsis Labs: No results for input(s): PROCALCITON, LATICACIDVEN in the last 168 hours.  Recent Results (from the past 240 hour(s))  Culture, blood (routine x 2) Call MD if unable to obtain prior to antibiotics being given     Status: None (Preliminary result)   Collection Time: 01/26/17  4:10 PM  Result Value Ref Range Status   Specimen Description BLOOD RIGHT ARM  Final   Special Requests   Final    IN PEDIATRIC BOTTLE Blood Culture adequate volume Performed at [facility], [address] [place], [place] [address]    Culture PENDING  Incomplete   Report Status PENDING  Incomplete  Culture, blood (routine x 2) Call MD if unable to obtain prior to antibiotics being given  Status: None (Preliminary result)   Collection Time: 01/26/17  4:10 PM  Result Value Ref Range Status   Specimen Description BLOOD LEFT HAND  Final   Special Requests   Final    BOTTLES DRAWN AEROBIC AND ANAEROBIC Blood Culture adequate volume Performed at [facility], [address], [place], [place] [address]    Culture PENDING  Incomplete   Report Status PENDING  Incomplete         Radiology Studies: Ct Abdomen Pelvis W Contrast  Result Date: 01/26/2017 CLINICAL DATA:  Abdominal pain. Nausea and vomiting. Patient with nausea and abdominal pain. EXAM: CT ABDOMEN AND PELVIS WITH CONTRAST TECHNIQUE: Multidetector CT imaging of the abdomen and pelvis was performed using the standard protocol following bolus administration of intravenous contrast. CONTRAST:  ISOVUE-300 IOPAMIDOL (ISOVUE-300) INJECTION 61% COMPARISON:  CT abdomen 03/29/2004. FINDINGS: Lower chest: Normal heart size. Subpleural opacities within the left-greater-than-right lower lobes favored to represent atelectasis. Patchy ground-glass opacities within the medial right lower lobe. Hepatobiliary: The liver is normal in size and contour. Patient status post  cholecystectomy. Mild intrahepatic biliary ductal dilatation. Common bile duct measures 6 mm. Pancreas: Unremarkable Spleen: Unremarkable Adrenals/Urinary Tract: The adrenal glands are normal. Kidneys enhance symmetrically with contrast. No hydronephrosis. Urinary bladder is unremarkable. Stomach/Bowel: Descending and sigmoid colonic diverticulosis. No CT evidence for acute diverticulitis. Small hiatal hernia. Normal morphology of the stomach. Multiple fluid-filled and mildly dilated loops of small bowel are demonstrated throughout the central abdomen with transition to decompressed small bowel within the central lower abdomen (image 60 ; series 2). There is a moderate amount of free fluid within the pelvis and small amount of fluid throughout the small bowel mesentery. Vascular/Lymphatic: Normal caliber abdominal aorta. Peripheral calcified atherosclerotic plaque. No retroperitoneal lymphadenopathy. There is atherosclerotic narrowing at the origin of the superior mesenteric artery (image 39; series 5). Reproductive: Uterus is surgically absent. Other: None. Musculoskeletal: Lumbar spine degenerative changes. No aggressive or acute appearing osseous lesions. IMPRESSION: 1. Multiple fluid filled mildly dilated loops of small bowel throughout the central abdomen transition to decompressed small bowel within the anterior lower abdomen. Findings may represent early and/or partial small bowel obstruction. Additionally there is a small amount of fluid within the small bowel mesentery and a moderate amount of fluid within the pelvis. 2. Atherosclerotic narrowing at the origin of the superior mesenteric artery. 3. Patchy ground-glass opacities within the medial right lower lobe may represent aspiration or infection. 4. Mild central intrahepatic biliary ductal dilatation which may be physiologic status post cholecystectomy. Recommend correlation with LFTs. Electronically Signed   By: Annia Belt M.D.   On: 01/26/2017 11:10     Dg Abd Acute W/chest  Result Date: 01/26/2017 CLINICAL DATA:  Vomiting, abdominal pain EXAM: DG ABDOMEN ACUTE W/ 1V CHEST COMPARISON:  09/28/2015 FINDINGS: Mild patchy left lower lobe opacities, atelectasis versus pneumonia. Mild right basilar opacities, likely atelectasis. No pleural effusion or pneumothorax. The heart is normal in size. Nonobstructive bowel gas pattern. No evidence of free air on the lateral decubitus view. Cholecystectomy clips. Degenerative changes of the visualized thoracolumbar spine. IMPRESSION: Mild patchy left lower lobe opacities, atelectasis versus pneumonia. No evidence of small bowel obstruction or free air. Electronically Signed   By: Charline Bills M.D.   On: 01/26/2017 10:21        Scheduled Meds: . amLODipine  5 mg Oral Q breakfast  . aspirin EC  81 mg Oral QHS  . atenolol  25 mg Oral Q breakfast  .  diatrizoate meglumine-sodium  90 mL Oral Once  . enoxaparin (LOVENOX) injection  40 mg Subcutaneous Q24H  . LORazepam  0.5 mg Oral Q breakfast  . LORazepam  1 mg Oral Daily  . losartan  100 mg Oral Q breakfast  . pantoprazole  40 mg Oral Daily  . predniSONE  10 mg Oral Q breakfast  . sennosides  5 mL Oral BID  . sertraline  100 mg Oral QHS  . sodium chloride  1 spray Each Nare BID   Continuous Infusions: . sodium chloride 75 mL/hr at 01/26/17 1655  . levofloxacin (LEVAQUIN) IV       LOS: 1 day    Time spent: over 30 min    Lacretia Nicks, MD [facility] Pager over 30 min  If 7PM-7AM, please contact night-coverage [email or web address] Password [facility] 01/27/2017, 10:23 AM

## 2017-01-27 NOTE — Progress Notes (Signed)
Telemetry called and stated that pt HR 150s. Pt up to use bathroom. Asymptomatic. Helped back to bed. Denies pain, discomfort, SOB. Tele called again and stated that pt in SR. Updated MD

## 2017-01-27 NOTE — Progress Notes (Addendum)
ANTICOAGULATION CONSULT NOTE - Initial Consult  Pharmacy Consult for Heparin Indication: atrial fibrillation  Allergies  Allergen Reactions  . Amoxicillin Nausea Only and Rash    Has patient had a PCN reaction causing immediate rash, facial/tongue/throat swelling, SOB or lightheadedness with hypotension: Yes Has patient had a PCN reaction causing severe rash involving mucus membranes or skin necrosis: No Has patient had a PCN reaction that required hospitalization No Has patient had a PCN reaction occurring within the last 10 years: No If all of the above answers are "NO", then may proceed with Cephalosporin use.   . Tetracyclines & Related Nausea Only and Rash    Patient Measurements: Height: 5\' 5"  (165.1 cm) Weight: 159 lb 13.3 oz (72.5 kg) IBW/kg (Calculated) : 57 Heparin Dosing Weight: 71.6kg  Vital Signs: Temp: 98.1 F (36.7 C) (01/06 0518) Temp Source: Oral (01/06 0518) BP: 121/74 (01/06 0900) Pulse Rate: 96 (01/06 0900)  Labs: Recent Labs    01/26/17 0949 01/26/17 1621 01/27/17 0501  HGB 14.6 14.5 12.4  HCT 44.1 44.4 39.4  PLT 233 204 213  CREATININE 1.09* 1.09* 1.02*    Estimated Creatinine Clearance: 43.9 mL/min (A) (by C-G formula based on SCr of 1.02 mg/dL (H)).   Medical History: Past Medical History:  Diagnosis Date  . Arthritis   . Carotid artery occlusion   . Colitis   . COPD (chronic obstructive pulmonary disease) (HCC)   . Diverticulitis   . Hypertension   . Keratosis, seborrheic     Medications:  Scheduled:  . amLODipine  5 mg Oral Q breakfast  . aspirin EC  81 mg Oral QHS  . atenolol  25 mg Oral Q breakfast  . diatrizoate meglumine-sodium  90 mL Oral Once  . enoxaparin (LOVENOX) injection  40 mg Subcutaneous Q24H  . LORazepam  0.5 mg Oral Q breakfast  . LORazepam  1 mg Oral Daily  . losartan  100 mg Oral Q breakfast  . pantoprazole  40 mg Oral Daily  . predniSONE  10 mg Oral Q breakfast  . sennosides  5 mL Oral BID  . sertraline   100 mg Oral QHS  . sodium chloride  1 spray Each Nare BID   Infusions:  . sodium chloride 75 mL/hr at 01/26/17 1655  . levofloxacin (LEVAQUIN) IV     PRN: acetaminophen, albuterol, ondansetron (ZOFRAN) IV  Assessment: 83 yo female with hx HTN and COPD admitted 1/5 with N/V and possible pneumonia.  Pharmacy is now consulted to dose IV heparin for afib.   Baseline PTT/PT/INR pending  CBC wnl  SCr 1.02, CrCl 44 ml/min  No anticoagulants prior to admission; aspirin 81mg  daily noted  Goal of Therapy:  Heparin level 0.3-0.7 units/ml Monitor platelets by anticoagulation protocol: Yes   Plan:   No heparin bolus since received lovenox last PM  Heparin IV infusion 1000 units/hr  Check heparin level in 8hrs  Daily heparin level and CBC  Loralee PacasErin Helmuth Recupero, PharmD, BCPS Pager: [phone] 01/27/2017,10:48 AM   Addendum: First heparin level = 0.64 which is therapeutic.  Continue heparin at current rate and recheck level in 8hr to confirm.  Loralee PacasErin Rema Lievanos, PharmD, BCPS 01/27/2017 8:37 PM

## 2017-01-27 NOTE — Progress Notes (Signed)
Pt family in room during shift. Family and pt report multiple Liquid stools and voids. Pt on bowel prep for GI study.

## 2017-01-28 ENCOUNTER — Encounter (HOSPITAL_COMMUNITY): Payer: Self-pay | Admitting: Cardiology

## 2017-01-28 ENCOUNTER — Inpatient Hospital Stay (HOSPITAL_COMMUNITY): Payer: Medicare Other

## 2017-01-28 DIAGNOSIS — I480 Paroxysmal atrial fibrillation: Secondary | ICD-10-CM

## 2017-01-28 DIAGNOSIS — I4891 Unspecified atrial fibrillation: Secondary | ICD-10-CM

## 2017-01-28 DIAGNOSIS — K56600 Partial intestinal obstruction, unspecified as to cause: Secondary | ICD-10-CM

## 2017-01-28 LAB — LEGIONELLA PNEUMOPHILA SEROGP 1 UR AG: L. pneumophila Serogp 1 Ur Ag: NEGATIVE

## 2017-01-28 LAB — BASIC METABOLIC PANEL
Anion gap: 8 (ref 5–15)
BUN: 22 mg/dL — AB (ref 6–20)
CO2: 23 mmol/L (ref 22–32)
Calcium: 8.4 mg/dL — ABNORMAL LOW (ref 8.9–10.3)
Chloride: 108 mmol/L (ref 101–111)
Creatinine, Ser: 0.91 mg/dL (ref 0.44–1.00)
GFR calc Af Amer: >60 mL/min (ref >60)
GFR calc non Af Amer: 58 mL/min — ABNORMAL LOW (ref >60)
GLUCOSE: 83 mg/dL (ref 65–99)
POTASSIUM: 3.8 mmol/L (ref 3.5–5.1)
SODIUM: 139 mmol/L (ref 135–145)

## 2017-01-28 LAB — CBC
HCT: 38.2 % (ref 36.0–46.0)
Hemoglobin: 12.1 g/dL (ref 12.0–15.0)
MCH: 28.8 pg (ref 26.0–34.0)
MCHC: 31.7 g/dL (ref 30.0–36.0)
MCV: 91 fL (ref 78.0–100.0)
Platelets: 200 10*3/uL (ref 150–400)
RBC: 4.2 MIL/uL (ref 3.87–5.11)
RDW: 15.1 % (ref 11.5–15.5)
WBC: 16.1 10*3/uL — ABNORMAL HIGH (ref 4.0–10.5)

## 2017-01-28 LAB — ECHOCARDIOGRAM COMPLETE
Height: 65 in
Weight: 2557.34 oz

## 2017-01-28 LAB — HEPARIN LEVEL (UNFRACTIONATED): HEPARIN UNFRACTIONATED: 0.68 [IU]/mL (ref 0.30–0.70)

## 2017-01-28 MED ORDER — APIXABAN 5 MG PO TABS
5.0000 mg | ORAL_TABLET | Freq: Two times a day (BID) | ORAL | Status: DC
  Administered 2017-01-28 – 2017-01-30 (×4): 5 mg via ORAL
  Filled 2017-01-28 (×4): qty 1

## 2017-01-28 MED ORDER — LABETALOL HCL 5 MG/ML IV SOLN: 10.0000 mg | Freq: Once | INTRAVENOUS | Status: DC

## 2017-01-28 NOTE — Progress Notes (Signed)
  Echocardiogram 2D Echocardiogram has been performed.  Leta JunglingCooper, Alverta Caccamo M 01/28/2017, 11:11 AM

## 2017-01-28 NOTE — Progress Notes (Signed)
ANTICOAGULATION CONSULT NOTE - Initial Consult  Pharmacy Consult for Apixaban Indication: atrial fibrillation  Allergies  Allergen Reactions  . Amoxicillin Nausea Only and Rash    Has patient had a PCN reaction causing immediate rash, facial/tongue/throat swelling, SOB or lightheadedness with hypotension: Yes Has patient had a PCN reaction causing severe rash involving mucus membranes or skin necrosis: No Has patient had a PCN reaction that required hospitalization No Has patient had a PCN reaction occurring within the last 10 years: No If all of the above answers are "NO", then may proceed with Cephalosporin use.   . Tetracyclines & Related Nausea Only and Rash    Patient Measurements: Height: 5\' 5"  (165.1 cm) Weight: 159 lb 13.3 oz (72.5 kg) IBW/kg (Calculated) : 57  Vital Signs: Temp: 97.9 F (36.6 C) (01/07 0441) Temp Source: Oral (01/07 0441) BP: 152/66 (01/07 0441) Pulse Rate: 76 (01/07 0441)  Labs: Recent Labs    01/26/17 1621 01/27/17 0501 01/27/17 1121 01/27/17 1932 01/28/17 0606  HGB 14.5 12.4  --   --  12.1  HCT 44.4 39.4  --   --  38.2  PLT 204 213  --   --  200  APTT  --   --  36  --   --   LABPROT  --   --  16.2*  --   --   INR  --   --  1.31  --   --   HEPARINUNFRC  --   --   --  0.64 0.68  CREATININE 1.09* 1.02*  --   --  0.91    Estimated Creatinine Clearance: 49.2 mL/min (by C-G formula based on SCr of 0.91 mg/dL).   Medical History: Past Medical History:  Diagnosis Date  . Arthritis   . Carotid artery occlusion   . Colitis   . COPD (chronic obstructive pulmonary disease) (HCC)   . Diverticulitis   . Hypertension   . Keratosis, seborrheic     Medications:  Scheduled:  . amLODipine  5 mg Oral Q breakfast  . aspirin EC  81 mg Oral QHS  . atenolol  25 mg Oral Q breakfast  . LORazepam  0.5 mg Oral Q breakfast  . LORazepam  1 mg Oral Daily  . losartan  100 mg Oral Q breakfast  . pantoprazole  40 mg Oral Daily  . predniSONE  10 mg  Oral Q breakfast  . sennosides  5 mL Oral BID  . sertraline  100 mg Oral QHS  . sodium chloride  1 spray Each Nare BID   Infusions:  . sodium chloride 75 mL/hr at 01/27/17 1553  . heparin 950 Units/hr (01/28/17 1400)  . levofloxacin (LEVAQUIN) IV 750 mg (01/27/17 1552)    Assessment: [name] admitted on 1/5 with possible SBO, PNA, and on heparin drip for Afib.  Pharmacy is now consulted to dose Apixaban.  No anticoagulants prior to admission, on aspirin 81mg  daily SCr 0.91, CrCl ~ 49 ml/min CBC WNL Diet: clear liquids  Goal of Therapy:  Monitor platelets by anticoagulation protocol: Yes   Plan:   D/C aspirin per cardiology in apixaban consult.  Stop heparin at 1800 and give first dose of apixaban at that time.  Apixaban 5 mg PO BID  Pharmacy to provide education prior to discharge  Follow up renal function, CBC while admitted  [name] PharmD, BCPS Pager [phone] 01/28/2017 2:21 PM

## 2017-01-28 NOTE — Care Management Note (Signed)
Case Management Note  Patient Details  Name: Gar PontoCarol S Hew MRN: [id number] Date of Birth: 06-Sep-1934  Subjective/Objective:checked benefit for eliquis-co pay $31. 50-patient agree/MD notified.                    Action/Plan:d/c home.   Expected Discharge Date:                  Expected Discharge Plan:  Home/Self Care  In-House Referral:     Discharge planning Services  CM Consult  Post Acute Care Choice:    Choice offered to:     DME Arranged:    DME Agency:     HH Arranged:    HH Agency:     Status of Service:  In process, will continue to follow  If discussed at Long Length of Stay Meetings, dates discussed:    Additional Comments:  Lanier ClamMahabir, Kyliana Standen, RN 01/28/2017, 4:32 PM

## 2017-01-28 NOTE — Progress Notes (Signed)
PROGRESS NOTE    Lori PontoCarol S Asaro  QMV:[id number] DOB: 1934-12-22 DOA: 01/26/2017 PCP: Blair HeysEhinger, Robert, MD   Brief Narrative:  [name] is [name] 83 y.o. female with medical history significant of hypertension, COPD, presented to emergency department with main concern of several days duration of progressively worsening nausea and multiple episodes of non bloody vomiting in the past 24 hours. Patient reports this was associated with diffuse abdominal pain, throbbing and constant, 7/10 in severity, non radiating, worse with drinking or eating, no specific alleviating factors. She was unable to keep anything down. Patient also reports noticing exertional dyspnea and mixed episodes of nonproductive and productive cough of clear sputum that started one day prior to this admission. Patient denies chest pain, no fevers or chills, no urinary concerns. Patient also denies any headaches or visual changes, no focal neurological symptoms such as numbness or tingling, no focal weakness.  Assessment & Plan:   Active Problems:   SBO (small bowel obstruction) (HCC)   Dyspnea, hx of COPD  CAP - Now on room air -  Mild patch LLL opacities on XR, atelectasis vs pneumonia and CT abd pelvis with contrast with medial R LL patchy ground glass opacities (aspiration vs infection) - pt has underlying COPD but no wheezing noted on exam at this time - Continue levaquin, ordered q 48 - 5 day course - Negative urine strep, negative legionella.  Pending sputum cx. - blood cx NGTD x 2 days  Nausea and vomiting  Partial SBO - CT abdomen pelvis with multiple fluid filled mildly dilated loops throughout central abdomen, transiion to decompressed small bowel within anterior lower abdomen - concern for early or partial SBO (also fluid in pelvis and mesentery) - exam without tenderness - Will do small bowel protocol -> contrast within large bowel and to level of rectum - advance to clears today - also place on bowel regimen    Atrial Fibrillation: No previous history.  chadvasc 5.  Appreciate cards recs.  Echo (EF 60-65%, grade 2 dd, elevate PAP - see report), normal TSH.  Likely induced with acute illness.  Continue atenol Cardiology c/s, appreciate recs -> eliquis  Leukocytosis - improved - follow blood cx, continue abx   Mild AKI - improving   Hypertension - Continue home medical regimen with amlodipine, atenolol, losartan  Collagenous Colitis: continue prednisone, discussed with Dr. Elnoria HowardHung  DVT prophylaxis: heparin gtt Code Status: full  Family Communication: none at bedside Disposition Plan: pending improvement   Consultants:   cardiology  Procedures:   None, echo and small bowel protocol pending  Antimicrobials:  Anti-infectives (From admission, onward)   Start     Dose/Rate Route Frequency Ordered Stop   01/27/17 1400  levofloxacin (LEVAQUIN) IVPB 750 mg     750 mg 100 mL/hr over 90 Minutes Intravenous Every 48 hours 01/26/17 1544 01/31/17 1359   01/26/17 1200  levofloxacin (LEVAQUIN) IVPB 500 mg     500 mg 100 mL/hr over 60 Minutes Intravenous  Once 01/26/17 1155 01/26/17 1451        Subjective: Presented with n/v since Friday. [facility]. Denies SOB or O2 at home.   No CP.  Objective: Vitals:   01/27/17 0900 01/27/17 1515 01/27/17 2013 01/28/17 0441  BP: 121/74 (!) 126/55 (!) 136/57 (!) 152/66  Pulse: 96 74 70 76  Resp:  18 18 17   Temp:   97.8 F (36.6 C) 97.9 F (36.6 C)  TempSrc:  Oral Oral Oral  SpO2:  90% 96% 91%  Weight:      Height:        Intake/Output Summary (Last 24 hours) at 01/28/2017 1600 Last data filed at 01/28/2017 0949 Gross per 24 hour  Intake 1650 ml  Output -  Net 1650 ml   Filed Weights   01/26/17 0842 01/26/17 2031  Weight: 72.6 kg (160 lb) 72.5 kg (159 lb 13.3 oz)    Examination:  General: No acute distress. Cardiovascular: Heart sounds show Marykate Heuberger regular rate, and rhythm. No gallops or rubs. No murmurs. No  JVD. Lungs: Crackles at bases, unlabored breathing  Abdomen: Soft, nontender, nondistended with normal active bowel sounds. No masses. No hepatosplenomegaly. Neurological: Alert and oriented 3. Moves all extremities 4 with equal strength. Cranial nerves II through XII grossly intact. Skin: Warm and dry. No rashes or lesions. Extremities: No clubbing or cyanosis. No edema.  Psychiatric: Mood and affect are normal. Insight and judgment are appropriate.   Data Reviewed: I have personally reviewed following labs and imaging studies  CBC: Recent Labs  Lab 01/26/17 0949 01/26/17 1621 01/27/17 0501 01/28/17 0606  WBC 18.7* 22.8* 25.8* 16.1*  NEUTROABS 15.7*  --   --   --   HGB 14.6 14.5 12.4 12.1  HCT 44.1 44.4 39.4 38.2  MCV 88.7 89.5 91.0 91.0  PLT 233 204 213 200   Basic Metabolic Panel: Recent Labs  Lab 01/26/17 0949 01/26/17 1621 01/27/17 0501 01/28/17 0606  NA 136  --  138 139  K 3.5  --  4.5 3.8  CL 96*  --  104 108  CO2 28  --  26 23  GLUCOSE 150*  --  119* 83  BUN 23*  --  22* 22*  CREATININE 1.09* 1.09* 1.02* 0.91  CALCIUM 9.3  --  8.3* 8.4*   GFR: Estimated Creatinine Clearance: 49.2 mL/min (by C-G formula based on SCr of 0.91 mg/dL). Liver Function Tests: Recent Labs  Lab 01/26/17 0949  AST 19  ALT 14  ALKPHOS 74  BILITOT 0.9  PROT 7.3  ALBUMIN 4.0   Recent Labs  Lab 01/26/17 0949  LIPASE 23   No results for input(s): AMMONIA in the last 168 hours. Coagulation Profile: Recent Labs  Lab 01/27/17 1121  INR 1.31   Cardiac Enzymes: No results for input(s): CKTOTAL, CKMB, CKMBINDEX, TROPONINI in the last 168 hours. BNP (last 3 results) No results for input(s): PROBNP in the last 8760 hours. HbA1C: No results for input(s): HGBA1C in the last 72 hours. CBG: No results for input(s): GLUCAP in the last 168 hours. Lipid Profile: No results for input(s): CHOL, HDL, LDLCALC, TRIG, CHOLHDL, LDLDIRECT in the last 72 hours. Thyroid Function  Tests: Recent Labs    01/27/17 1932  TSH 0.678   Anemia Panel: No results for input(s): VITAMINB12, FOLATE, FERRITIN, TIBC, IRON, RETICCTPCT in the last 72 hours. Sepsis Labs: No results for input(s): PROCALCITON, LATICACIDVEN in the last 168 hours.  Recent Results (from the past 240 hour(s))  Culture, blood (routine x 2) Call MD if unable to obtain prior to antibiotics being given     Status: None (Preliminary result)   Collection Time: 01/26/17  4:10 PM  Result Value Ref Range Status   Specimen Description BLOOD RIGHT ARM  Final   Special Requests IN PEDIATRIC BOTTLE Blood Culture adequate volume  Final   Culture   Final    NO GROWTH 2 DAYS Performed at [facility], [address] [place], [place] [address]  Report Status PENDING  Incomplete  Culture, blood (routine x 2) Call MD if unable to obtain prior to antibiotics being given     Status: None (Preliminary result)   Collection Time: 01/26/17  4:10 PM  Result Value Ref Range Status   Specimen Description BLOOD LEFT HAND  Final   Special Requests   Final    BOTTLES DRAWN AEROBIC AND ANAEROBIC Blood Culture adequate volume   Culture   Final    NO GROWTH 2 DAYS Performed at [facility], [address] [place], [place] [address]    Report Status PENDING  Incomplete         Radiology Studies: Dg Abd Portable 1v-small Bowel Obstruction Protocol-initial, 8 Hr Delay  Result Date: 01/28/2017 CLINICAL DATA:  Small-bowel obstruction, 8 hour delayed image. EXAM: PORTABLE ABDOMEN - 1 VIEW COMPARISON:  01/26/2017 CT FINDINGS: Enteric contrast is seen within large bowel to the level of the rectum with scattered colonic diverticulosis. Mild gaseous distention of small bowel without contrast is noted centrally. Findings likely represent Seona Clemenson partial SBO. No free air is noted. Degenerative changes are seen along the dorsal spine and both hips. Cholecystectomy clips are seen the right upper quadrant. IMPRESSION: Contrast is  noted within large bowel to level of the rectum. Given mild gaseous distention of small bowel, findings likely represented Riona Lahti partial SBO. Electronically Signed   By: Tollie Eth M.D.   On: 01/28/2017 03:05        Scheduled Meds: . amLODipine  5 mg Oral Q breakfast  . apixaban  5 mg Oral Q12H  . atenolol  25 mg Oral Q breakfast  . LORazepam  0.5 mg Oral Q breakfast  . LORazepam  1 mg Oral Daily  . losartan  100 mg Oral Q breakfast  . pantoprazole  40 mg Oral Daily  . predniSONE  10 mg Oral Q breakfast  . sennosides  5 mL Oral BID  . sertraline  100 mg Oral QHS  . sodium chloride  1 spray Each Nare BID   Continuous Infusions: . sodium chloride 75 mL/hr at 01/27/17 1553  . heparin 950 Units/hr (01/28/17 1400)  . levofloxacin (LEVAQUIN) IV 750 mg (01/27/17 1552)     LOS: 2 days    Time spent: over 30 min    Lacretia Nicks, MD [facility] Pager over 30 min  If 7PM-7AM, please contact night-coverage [email or web address] Password [email or web address] 01/28/2017, 4:00 PM

## 2017-01-28 NOTE — Progress Notes (Signed)
ANTICOAGULATION CONSULT NOTE - Follow Up Consult  Pharmacy Consult for Heparin Indication: atrial fibrillation  Allergies  Allergen Reactions  . Amoxicillin Nausea Only and Rash    Has patient had a PCN reaction causing immediate rash, facial/tongue/throat swelling, SOB or lightheadedness with hypotension: Yes Has patient had a PCN reaction causing severe rash involving mucus membranes or skin necrosis: No Has patient had a PCN reaction that required hospitalization No Has patient had a PCN reaction occurring within the last 10 years: No If all of the above answers are "NO", then may proceed with Cephalosporin use.   . Tetracyclines & Related Nausea Only and Rash    Patient Measurements: Height: 5\' 5"  (165.1 cm) Weight: 159 lb 13.3 oz (72.5 kg) IBW/kg (Calculated) : 57 Heparin Dosing Weight:   Vital Signs: Temp: 97.9 F (36.6 C) (01/07 0441) Temp Source: Oral (01/07 0441) BP: 152/66 (01/07 0441) Pulse Rate: 76 (01/07 0441)  Labs: Recent Labs    01/26/17 1621 01/27/17 0501 01/27/17 1121 01/27/17 1932 01/28/17 0606  HGB 14.5 12.4  --   --  12.1  HCT 44.4 39.4  --   --  38.2  PLT 204 213  --   --  200  APTT  --   --  36  --   --   LABPROT  --   --  16.2*  --   --   INR  --   --  1.31  --   --   HEPARINUNFRC  --   --   --  0.64 0.68  CREATININE 1.09* 1.02*  --   --  0.91    Estimated Creatinine Clearance: 49.2 mL/min (by C-G formula based on SCr of 0.91 mg/dL).   Medications:  Infusions:  . sodium chloride 75 mL/hr at 01/27/17 1553  . heparin 1,000 Units/hr (01/27/17 1211)  . levofloxacin (LEVAQUIN) IV 750 mg (01/27/17 1552)    Assessment: Patient with heparin level at goal.  No heparin issues per RN.  While heparin level at goal, it moved to higher end of goal.  Will decrease rate to assure level stays within goal.  Goal of Therapy:  Heparin level 0.3-0.7 units/ml Monitor platelets by anticoagulation protocol: Yes   Plan:  Decrease heparin to 950  units/hr Recheck level at [address]  Traveon Louro Jr, [place] 01/28/2017,6:41 AM

## 2017-01-28 NOTE — Care Management Note (Signed)
Case Management Note  Patient Details  Name: [name] MRN: [id number] Date of Birth: [birth year]  Subjective/Objective:  83 y/o f admitted w/SBO. Afib. Frjom home. CM referral for benefit check eliquis-will await response from insurance.                  Action/Plan:d/c plan home.   Expected Discharge Date:                  Expected Discharge Plan:  Home/Self Care  In-House Referral:     Discharge planning Services  CM Consult  Post Acute Care Choice:    Choice offered to:     DME Arranged:    DME Agency:     HH Arranged:    HH Agency:     Status of Service:  In process, will continue to follow  If discussed at Long Length of Stay Meetings, dates discussed:    Additional Comments:  Lanier ClamMahabir, Mairi Stagliano, RN 01/28/2017, 4:11 PM

## 2017-01-28 NOTE — Consult Note (Signed)
Cardiology Consultation:   Patient ID: [name]; [id number]; [birth year]   Admit date: 01/26/2017 Date of Consult: 01/28/2017  Primary Care Provider: Blair Heys, MD Primary Cardiologist: Olga Millers, MD new Primary Electrophysiologist:  NA   Patient Profile:   [name] is a 83 y.o. female with a hx of HTN, COPD and carotid stenosis, s/p Left CEA 2010. who is being seen today for the evaluation of a fib at the request of Dr. Shaune Spittle.  History of Present Illness:   Ms. Olberding has a hx of HTN, carotid stenosis with Lt CEA in 2010 and COPD and was admitted 01/26/17 with N & V, also with diffuse abdominal pain, that increased with eating and drinking.  + DOE, + cough -productive with clear mucus.  Possible small bowel obstruction also possible PNA.    EKG I have personally reviewed:  atrial fib states since last tracing but no old tracing that I have access to.  No acute EKG changes.  TELE I personally reviewed initially a fib then almost appears to be flutter then at 1507 converted to SR.  Has maintained SR since that time.     Troponin none Na today 139, K+ 3.8, Cr 0.91 improved form 1.09  WBC 18.7 on admit up to 25.8 and today 16.1 Hgb 14.6 TSH 0.678  CT abd and pelvis Multiple fluid filled mildly dilated loops of small bowel throughout the central abdomen transition to decompressed small bowel within the anterior lower abdomen. Findings may represent early and/or partial small bowel obstruction. Additionally there is a small amount of fluid within the small bowel mesentery and a moderate amount of fluid within the pelvis. 2. Atherosclerotic narrowing at the origin of the superior mesenteric artery. 3. Patchy ground-glass opacities within the medial right lower lobe may represent aspiration or infection. 4. Mild central intrahepatic biliary ductal dilatation which may be physiologic status post cholecystectomy. Recommend correlation with LFTs.  Echo Pending -being  done now  Currently pain free has passed a lot of gas.  N&V improved.  Pt has not had any chest pain - and prior to this N& V she has not had any DOE or any more than her usual.  She has no history of CAD or cardiac problems herself.  2 brothers with MIs and her mother with atrial fib.  Pt was not aware of any irregular HR.      Past Medical History:  Diagnosis Date  . Arthritis   . Carotid artery occlusion   . Colitis   . COPD (chronic obstructive pulmonary disease) (HCC)   . Diverticulitis   . Hypertension   . Keratosis, seborrheic     Past Surgical History:  Procedure Laterality Date  . ABDOMINAL HYSTERECTOMY    . BREAST SURGERY     cyst removal  . CAROTID ENDARTERECTOMY Left 2010  . CHOLECYSTECTOMY    . EYE SURGERY Bilateral 06/2014   cataracts  . JOINT REPLACEMENT     RIGHT KNEE  . REPLACEMENT TOTAL KNEE Right      Home Medications:  Prior to Admission medications   Medication Sig Start Date End Date Taking? Authorizing Provider  acetaminophen (TYLENOL) 500 MG tablet Take 1,000 mg by mouth every 6 (six) hours as needed for mild pain, moderate pain, fever or headache.   Yes [provider]  amLODipine (NORVASC) 5 MG tablet Take 5 mg by mouth daily with breakfast.    Yes [provider]  aspirin EC 81 MG tablet Take  81 mg by mouth at bedtime.   Yes [provider]  atenolol (TENORMIN) 25 MG tablet Take 25 mg by mouth daily with breakfast.  07/27/14  Yes [provider]  Coral Calcium (SM CORAL CALCIUM) 1000 (390 Ca) MG TABS Take 1,000 mg by mouth at bedtime.   Yes [provider]  LORazepam (ATIVAN) 1 MG tablet Take 0.5-1 mg by mouth 2 (two) times daily. Takes 0.5mg  right when she gets up for the day and then a little while later she takes 1mg    Yes [provider]  losartan (COZAAR) 100 MG tablet Take 100 mg by mouth daily with breakfast.    Yes [provider]  omeprazole (PRILOSEC) 40 MG capsule Take 40 mg by  mouth daily with breakfast.    Yes [provider]  predniSONE (DELTASONE) 10 MG tablet Take 10 mg by mouth daily.  12/17/16  Yes [provider]  sertraline (ZOLOFT) 100 MG tablet Take 100 mg by mouth at bedtime.    Yes [provider]  sodium chloride (OCEAN) 0.65 % SOLN nasal spray Place 1 spray into both nostrils 2 (two) times daily.   Yes [provider]  nitroGLYCERIN (NITRODUR - DOSED IN MG/24 HR) 0.2 mg/hr patch Apply 1/4 patch to affected area daily.  Change patch daily and slightly alter location daily. Patient not taking: Reported on 01/26/2017 12/13/15   Andrena Mews, DO  ondansetron (ZOFRAN ODT) 8 MG disintegrating tablet Take 1 tablet (8 mg total) by mouth every 8 (eight) hours as needed for nausea or vomiting. Patient not taking: Reported on 09/25/2016 10/24/15   Cathren Laine, MD    Inpatient Medications: Scheduled Meds: . amLODipine  5 mg Oral Q breakfast  . aspirin EC  81 mg Oral QHS  . atenolol  25 mg Oral Q breakfast  . LORazepam  0.5 mg Oral Q breakfast  . LORazepam  1 mg Oral Daily  . losartan  100 mg Oral Q breakfast  . pantoprazole  40 mg Oral Daily  . predniSONE  10 mg Oral Q breakfast  . sennosides  5 mL Oral BID  . sertraline  100 mg Oral QHS  . sodium chloride  1 spray Each Nare BID   Continuous Infusions: . sodium chloride 75 mL/hr at 01/27/17 1553  . heparin 950 Units/hr (01/28/17 0644)  . levofloxacin (LEVAQUIN) IV 750 mg (01/27/17 1552)   PRN Meds: acetaminophen, albuterol, ondansetron (ZOFRAN) IV  Allergies:    Allergies  Allergen Reactions  . Amoxicillin Nausea Only and Rash    Has patient had a PCN reaction causing immediate rash, facial/tongue/throat swelling, SOB or lightheadedness with hypotension: Yes Has patient had a PCN reaction causing severe rash involving mucus membranes or skin necrosis: No Has patient had a PCN reaction that required hospitalization No Has patient had a PCN reaction occurring  within the last 10 years: No If all of the above answers are "NO", then may proceed with Cephalosporin use.   . Tetracyclines & Related Nausea Only and Rash    Social History:   Social History   Socioeconomic History  . Marital status: Widowed    Spouse name: Not on file  . Number of children: Not on file  . Years of education: Not on file  . Highest education level: Not on file  Social Needs  . Financial resource strain: Not on file  . Food insecurity - worry: Not on file  . Food insecurity - inability: Not on file  .  Transportation needs - medical: Not on file  . Transportation needs - non-medical: Not on file  Occupational History  . Not on file  Tobacco Use  . Smoking status: Former Smoker    Years: 50.00    Types: Cigarettes    Last attempt to quit: 02/04/2002    Years since quitting: 14.9  . Smokeless tobacco: Never Used  Substance and Sexual Activity  . Alcohol use: No  . Drug use: No  . Sexual activity: Not on file  Other Topics Concern  . Not on file  Social History Narrative  . Not on file    Family History:    Family History  Problem Relation Age of Onset  . Heart disease Mother        After age 64  . AAA (abdominal aortic aneurysm) Sister   . AAA (abdominal aortic aneurysm) Brother   . Heart attack Brother   . Breast cancer Neg Hx      ROS:  Please see the history of present illness.  ROS  General:no colds or fevers, no weight changes Skin:no rashes or ulcers HEENT:no blurred vision, no congestion CV:see HPI PUL:see HPI GI:no diarrhea constipation or melena, no indigestion, + N&V GU:no hematuria, no dysuria, +N&V MS:no joint pain, no claudication Neuro:no syncope, no lightheadedness Endo:no diabetes, no thyroid disease      Physical Exam/Data:   Vitals:   01/27/17 0900 01/27/17 1515 01/27/17 2013 01/28/17 0441  BP: 121/74 (!) 126/55 (!) 136/57 (!) 152/66  Pulse: 96 74 70 76  Resp:  18 18 17   Temp:   97.8 F (36.6 C) 97.9 F (36.6  C)  TempSrc:  Oral Oral Oral  SpO2:  90% 96% 91%  Weight:      Height:        Intake/Output Summary (Last 24 hours) at 01/28/2017 1009 Last data filed at 01/28/2017 0949 Gross per 24 hour  Intake 1968.17 ml  Output -  Net 1968.17 ml   Filed Weights   01/26/17 0842 01/26/17 2031  Weight: 160 lb (72.6 kg) 159 lb 13.3 oz (72.5 kg)   Body mass index is 26.6 kg/m.  General:  Well nourished, well developed, in no acute distress having her echo done HEENT: normal Lymph: no adenopathy Neck: no JVD Endocrine:  No thryomegaly Vascular: + Lt carotid bruits, did not hear one on Rt,  2+ pedal pulses bil  Cardiac:  normal S1, S2; RRR; no murmur gallup rub or click Lungs:  clear to auscultation bilaterally, no wheezing, rhonchi or rales  Abd: soft, nontender, no hepatomegaly  Ext: no edema Musculoskeletal:  No deformities, BUE and BLE strength normal and equal Skin: warm and dry  Neuro:  Alert and oriented X 3 MAE, follows commands, no focal abnormalities noted Psych:  Normal affect    Relevant CV Studies: Echo pending  Laboratory Data:  Chemistry Recent Labs  Lab 01/26/17 0949 01/26/17 1621 01/27/17 0501 01/28/17 0606  NA 136  --  138 139  K 3.5  --  4.5 3.8  CL 96*  --  104 108  CO2 28  --  26 23  GLUCOSE 150*  --  119* 83  BUN 23*  --  22* 22*  CREATININE 1.09* 1.09* 1.02* 0.91  CALCIUM 9.3  --  8.3* 8.4*  GFRNONAA 47* 47* 51* 58*  GFRAA 54* 54* 59* >60  ANIONGAP 12  --  8 8    Recent Labs  Lab 01/26/17 0949  PROT 7.3  ALBUMIN  4.0  AST 19  ALT 14  ALKPHOS 74  BILITOT 0.9   Hematology Recent Labs  Lab 01/26/17 1621 01/27/17 0501 01/28/17 0606  WBC 22.8* 25.8* 16.1*  RBC 4.96 4.33 4.20  HGB 14.5 12.4 12.1  HCT 44.4 39.4 38.2  MCV 89.5 91.0 91.0  MCH 29.2 28.6 28.8  MCHC 32.7 31.5 31.7  RDW 14.7 15.2 15.1  PLT 204 213 200   Cardiac EnzymesNo results for input(s): TROPONINI in the last 168 hours. No results for input(s): TROPIPOC in the last 168  hours.  BNPNo results for input(s): BNP, PROBNP in the last 168 hours.  DDimer No results for input(s): DDIMER in the last 168 hours.  Radiology/Studies:  Ct Abdomen Pelvis W Contrast  Result Date: 01/26/2017 CLINICAL DATA:  Abdominal pain. Nausea and vomiting. Patient with nausea and abdominal pain. EXAM: CT ABDOMEN AND PELVIS WITH CONTRAST TECHNIQUE: Multidetector CT imaging of the abdomen and pelvis was performed using the standard protocol following bolus administration of intravenous contrast. CONTRAST:  ISOVUE-300 IOPAMIDOL (ISOVUE-300) INJECTION 61% COMPARISON:  CT abdomen 03/29/2004. FINDINGS: Lower chest: Normal heart size. Subpleural opacities within the left-greater-than-right lower lobes favored to represent atelectasis. Patchy ground-glass opacities within the medial right lower lobe. Hepatobiliary: The liver is normal in size and contour. Patient status post cholecystectomy. Mild intrahepatic biliary ductal dilatation. Common bile duct measures 6 mm. Pancreas: Unremarkable Spleen: Unremarkable Adrenals/Urinary Tract: The adrenal glands are normal. Kidneys enhance symmetrically with contrast. No hydronephrosis. Urinary bladder is unremarkable. Stomach/Bowel: Descending and sigmoid colonic diverticulosis. No CT evidence for acute diverticulitis. Small hiatal hernia. Normal morphology of the stomach. Multiple fluid-filled and mildly dilated loops of small bowel are demonstrated throughout the central abdomen with transition to decompressed small bowel within the central lower abdomen (image 60 ; series 2). There is a moderate amount of free fluid within the pelvis and small amount of fluid throughout the small bowel mesentery. Vascular/Lymphatic: Normal caliber abdominal aorta. Peripheral calcified atherosclerotic plaque. No retroperitoneal lymphadenopathy. There is atherosclerotic narrowing at the origin of the superior mesenteric artery (image 39; series 5). Reproductive: Uterus is  surgically absent. Other: None. Musculoskeletal: Lumbar spine degenerative changes. No aggressive or acute appearing osseous lesions. IMPRESSION: 1. Multiple fluid filled mildly dilated loops of small bowel throughout the central abdomen transition to decompressed small bowel within the anterior lower abdomen. Findings may represent early and/or partial small bowel obstruction. Additionally there is a small amount of fluid within the small bowel mesentery and a moderate amount of fluid within the pelvis. 2. Atherosclerotic narrowing at the origin of the superior mesenteric artery. 3. Patchy ground-glass opacities within the medial right lower lobe may represent aspiration or infection. 4. Mild central intrahepatic biliary ductal dilatation which may be physiologic status post cholecystectomy. Recommend correlation with LFTs. Electronically Signed   By: Annia Belt M.D.   On: 01/26/2017 11:10   Dg Abd Acute W/chest  Result Date: 01/26/2017 CLINICAL DATA:  Vomiting, abdominal pain EXAM: DG ABDOMEN ACUTE W/ 1V CHEST COMPARISON:  09/28/2015 FINDINGS: Mild patchy left lower lobe opacities, atelectasis versus pneumonia. Mild right basilar opacities, likely atelectasis. No pleural effusion or pneumothorax. The heart is normal in size. Nonobstructive bowel gas pattern. No evidence of free air on the lateral decubitus view. Cholecystectomy clips. Degenerative changes of the visualized thoracolumbar spine. IMPRESSION: Mild patchy left lower lobe opacities, atelectasis versus pneumonia. No evidence of small bowel obstruction or free air. Electronically Signed   By: Charline Bills M.D.   On: 01/26/2017  10:21   Dg Abd Portable 1v-small Bowel Obstruction Protocol-initial, 8 Hr Delay  Result Date: 01/28/2017 CLINICAL DATA:  Small-bowel obstruction, 8 hour delayed image. EXAM: PORTABLE ABDOMEN - 1 VIEW COMPARISON:  01/26/2017 CT FINDINGS: Enteric contrast is seen within large bowel to the level of the rectum with scattered  colonic diverticulosis. Mild gaseous distention of small bowel without contrast is noted centrally. Findings likely represent a partial SBO. No free air is noted. Degenerative changes are seen along the dorsal spine and both hips. Cholecystectomy clips are seen the right upper quadrant. IMPRESSION: Contrast is noted within large bowel to level of the rectum. Given mild gaseous distention of small bowel, findings likely represented a partial SBO. Electronically Signed   By: Tollie Ethavid  Kwon M.D.   On: 01/28/2017 03:05    Assessment and Plan:   1. Atrial fibrillation now SR converted at 1507 yesterday.  She is on tenormin and has been previously.   CHA2DS2VASc is 5 with age, F, htn, and carotid disease.  She is on IV heparin.  Unsure when she went into a fib if only with acute episode-- ? Continue anticoagulation add eliquis-  Dr. Jens Somrenshaw to see.  Echo Pending- this may help in determing further eval.  Continue tenormin vs change to metoprolol.  2. HTN controlled 3. PAD of carotids with hx Lt CEA 4. PNA per IM 5. Possible sm bowel obstruction or partial though improved.   For questions or updates, please contact [facility] Please consult [email or web address] for contact info under Cardiology/STEMI.   Signed, Nada BoozerLaura Ingold, NP  01/28/2017 10:09 AM; As above, patient seen and examined.  Briefly she is an 83 year old female with past medical history of hypertension, carotid artery disease with prior carotid endarterectomy and COPD admitted with small bowel obstruction for evaluation of atrial fibrillation.  Patient has dyspnea with more vigorous activities but not routine activities.  No orthopnea, PND, pedal edema, palpitations, syncope or exertional chest pain.  With developed abdominal pain January 4 admitted with possible SBO.  Electrocardiogram showed atrial fibrillation and she converted to sinus rhythm.  Cardiology asked to evaluate.  Creatinine 0.91.  Hemoglobin 12.1.  TSH normal.  1 newly diagnosed  atrial fibrillation-electrocardiogram was personally reviewed and showed atrial fibrillation.  She does not have symptoms of dyspnea, palpitations or chest pain.  TSH normal.  She has converted to sinus rhythm.  Continue atenolol for rate control if atrial fibrillation recurs. CHADSvasc 5.  Would treat with apixaban 5 mg twice daily long-term.  Discontinue aspirin.  We will await echocardiogram.  If LV function normal she does not require further cardiac evaluation and can be discharged.  She can follow-up with me for cardiac issues.  2 history of carotid artery disease-discontinue aspirin given need for anticoagulation.  She would benefit from a statin long-term.  3 hypertension-would continue preadmission blood pressure medications.  4 SBO-per primary care.  Please call with questions  Olga MillersBrian Mylen Mangan, MD

## 2017-01-29 ENCOUNTER — Inpatient Hospital Stay (HOSPITAL_COMMUNITY): Payer: Medicare Other

## 2017-01-29 LAB — CBC
HEMATOCRIT: 35.9 % — AB (ref 36.0–46.0)
Hemoglobin: 11.5 g/dL — ABNORMAL LOW (ref 12.0–15.0)
MCH: 28.7 pg (ref 26.0–34.0)
MCHC: 32 g/dL (ref 30.0–36.0)
MCV: 89.5 fL (ref 78.0–100.0)
PLATELETS: 216 10*3/uL (ref 150–400)
RBC: 4.01 MIL/uL (ref 3.87–5.11)
RDW: 14.5 % (ref 11.5–15.5)
WBC: 12.3 10*3/uL — AB (ref 4.0–10.5)

## 2017-01-29 LAB — BASIC METABOLIC PANEL
ANION GAP: 8 (ref 5–15)
BUN: 12 mg/dL (ref 6–20)
CALCIUM: 8.4 mg/dL — AB (ref 8.9–10.3)
CO2: 23 mmol/L (ref 22–32)
Chloride: 107 mmol/L (ref 101–111)
Creatinine, Ser: 0.82 mg/dL (ref 0.44–1.00)
GFR calc Af Amer: >60 mL/min (ref >60)
GLUCOSE: 96 mg/dL (ref 65–99)
POTASSIUM: 3.2 mmol/L — AB (ref 3.5–5.1)
Sodium: 138 mmol/L (ref 135–145)

## 2017-01-29 LAB — MAGNESIUM: Magnesium: 1.6 mg/dL — ABNORMAL LOW (ref 1.7–2.4)

## 2017-01-29 MED ORDER — POTASSIUM CHLORIDE CRYS ER 20 MEQ PO TBCR
40.0000 meq | EXTENDED_RELEASE_TABLET | ORAL | Status: AC
  Administered 2017-01-29 (×2): 40 meq via ORAL
  Filled 2017-01-29 (×3): qty 2

## 2017-01-29 MED ORDER — LEVOFLOXACIN 750 MG PO TABS
750.0000 mg | ORAL_TABLET | ORAL | Status: AC
  Administered 2017-01-29 – 2017-01-30 (×2): 750 mg via ORAL
  Filled 2017-01-29 (×2): qty 1

## 2017-01-29 MED ORDER — POLYETHYLENE GLYCOL 3350 17 G PO PACK
17.0000 g | PACK | Freq: Two times a day (BID) | ORAL | Status: DC
  Administered 2017-01-29 (×2): 17 g via ORAL
  Filled 2017-01-29 (×3): qty 1

## 2017-01-29 MED ORDER — MAGNESIUM SULFATE 2 GM/50ML IV SOLN
2.0000 g | Freq: Once | INTRAVENOUS | Status: AC
  Administered 2017-01-29: 2 g via INTRAVENOUS
  Filled 2017-01-29: qty 50

## 2017-01-29 NOTE — Consult Note (Signed)
Reason for Consult:  SBO Referring Physician: Riley Kill MD  [name] is an 83 y.o. female.  HPI: Patient is an 83 year old female who presented to ED on 01/26/17 with abdominal pain nausea and vomiting for around 24 hours.  She reported diffuse abdominal pain along with vomiting throughout the night prior to admission was unable to get comfortable she has had one loose stool but no diarrhea.  Prior surgeries include an appendectomy cholecystectomy and hysterectomy.  The workup in the ED showed she was afebrile but vital signs were stable.  Admission labs shows a WBC of 18.7, hemoglobin 14.6, hematocrit 44.1, platelets 233,000.  Electrolytes were normal except for a chloride of 96 glucose was 150 BUN was 23 creatinine was 1.05.  LFTs were normal lipase was 23.  Acute chest and abdomen shows some patchy left lower lobe opacities atelectasis versus pneumonia but no evidence of small bowel obstruction or free air.  A CT scan was then obtained this showed multiple fluid-filled mildly dilated loops of small bowel throughout the central abdomen transition to decompressed small bowel within the anterior lower abdomen.  Findings were consistent with a partial or early small bowel obstruction.  There was also some fluid within the mesentery and pelvis.  There was some patchy ground opacities in the medial right middle lobe which might represent aspiration or infection.  There was mild biliary intrahepatic ductal dilatation which most likely corresponded to her prior cholecystectomy.     She was admitted by Dr. Doyle Askew to the hospitalist service.  Diagnoses included dyspnea with a history of COPD of unclear etiology.  Nausea and vomiting, leukocytosis mild acute kidney injury and hypertension.  She was seen the following day by Dr. Florene Glen she was placed on Levaquin for her pneumonia.  She was started on small bowel protocol by the hospitalist service to help evaluate her possible small bowel obstruction or partial  obstruction.  She developed atrial fibrillation after admission also was started on heparin.  Hospital course: Patient was started on antibiotics for possible pneumonia and she was started on the small bowel protocol.  She received Gastrografin at 12:50 PM on 01/27/17.  Follow-up film on in the evening of 1 6/19 showed contrast within the large bowel to the level of the rectum but ongoing mild gaseous distention findings likely represent a partial small bowel obstruction. Patient was started on clear liquids and then advance to full liquids but repeat film today shows ongoing distended loops of gas-filled bowel in the left mid abdomen there is some stool and gas within normal caliber colon and the rectum there is no free extraluminal gas collections are surgical clips in the gallbladder fossa degenerative changes lumbar spine and hips contrast was demonstrated in the colon and that had passed.  Patient is currently afebrile vital signs are stable.  Labs today shows white count is down to 12.3 from a high of 25.8.  Magnesium is 1.6.  Potassium is 3.2 creatinine is 0.82 down from a max of 1.09.  We are asked to see.  Currently her abdomen is soft, she has had 2 bowel movements today.  Tolerating a full liquid diet well.  Past Medical History:  Diagnosis Date  . Arthritis   . Carotid artery occlusion   . Colitis   . COPD (chronic obstructive pulmonary disease) ([place])   . Diverticulitis   . Hypertension   . Keratosis, seborrheic     Past Surgical History:  Procedure Laterality Date  . ABDOMINAL HYSTERECTOMY    .  BREAST SURGERY     cyst removal  . CAROTID ENDARTERECTOMY Left 2010  . CHOLECYSTECTOMY    . EYE SURGERY Bilateral 06/2014   cataracts  . JOINT REPLACEMENT     RIGHT KNEE  . REPLACEMENT TOTAL KNEE Right     Family History  Problem Relation Age of Onset  . Heart disease Mother        After age 59  . AAA (abdominal aortic aneurysm) Sister   . AAA (abdominal aortic aneurysm) Brother    . Heart attack Brother   . Breast cancer Neg Hx     Social History:  reports that she quit smoking about 14 years ago. Her smoking use included cigarettes. She quit after 50.00 years of use. she has never used smokeless tobacco. She reports that she does not drink alcohol or use drugs. Tobacco: 52 years 1-1/2 packs/day quit 14 years ago. Drugs: None EtOH: Social/a little more social when she was younger She lives with a 46 year old sister with severe dementia. Allergies:  Allergies  Allergen Reactions  . Amoxicillin Nausea Only and Rash    Has patient had a PCN reaction causing immediate rash, facial/tongue/throat swelling, SOB or lightheadedness with hypotension: Yes Has patient had a PCN reaction causing severe rash involving mucus membranes or skin necrosis: No Has patient had a PCN reaction that required hospitalization No Has patient had a PCN reaction occurring within the last 10 years: No If all of the above answers are "NO", then may proceed with Cephalosporin use.   . Tetracyclines & Related Nausea Only and Rash    Medications:  Prior to Admission:  Medications Prior to Admission  Medication Sig Dispense Refill Last Dose  . acetaminophen (TYLENOL) 500 MG tablet Take 1,000 mg by mouth every 6 (six) hours as needed for mild pain, moderate pain, fever or headache.   01/25/2017 at Unknown time  . amLODipine (NORVASC) 5 MG tablet Take 5 mg by mouth daily with breakfast.    01/25/2017 at Unknown time  . aspirin EC 81 MG tablet Take 81 mg by mouth at bedtime.   01/25/2017 at Unknown time  . atenolol (TENORMIN) 25 MG tablet Take 25 mg by mouth daily with breakfast.   3 01/25/2017 at 0700  . Coral Calcium (SM CORAL CALCIUM) 1000 (390 Ca) MG TABS Take 1,000 mg by mouth at bedtime.   01/25/2017 at Unknown time  . LORazepam (ATIVAN) 1 MG tablet Take 0.5-1 mg by mouth 2 (two) times daily. Takes 0.69m right when she gets up for the day and then a little while later she takes 194m  01/25/2017 at  Unknown time  . losartan (COZAAR) 100 MG tablet Take 100 mg by mouth daily with breakfast.    01/25/2017 at Unknown time  . omeprazole (PRILOSEC) 40 MG capsule Take 40 mg by mouth daily with breakfast.    01/25/2017 at Unknown time  . predniSONE (DELTASONE) 10 MG tablet Take 10 mg by mouth daily.   3 01/25/2017 at Unknown time  . sertraline (ZOLOFT) 100 MG tablet Take 100 mg by mouth at bedtime.    01/25/2017 at Unknown time  . sodium chloride (OCEAN) 0.65 % SOLN nasal spray Place 1 spray into both nostrils 2 (two) times daily.   01/25/2017 at Unknown time  . nitroGLYCERIN (NITRODUR - DOSED IN MG/24 HR) 0.2 mg/hr patch Apply 1/4 patch to affected area daily.  Change patch daily and slightly alter location daily. (Patient not taking: Reported on 01/26/2017) 24 patch 1  Not Taking at Unknown time  . ondansetron (ZOFRAN ODT) 8 MG disintegrating tablet Take 1 tablet (8 mg total) by mouth every 8 (eight) hours as needed for nausea or vomiting. (Patient not taking: Reported on 09/25/2016) 10 tablet 0 Not Taking at Unknown time   Continuous: . sodium chloride 75 mL/hr at 01/29/17 0519  . magnesium sulfate 1 - 4 g bolus IVPB     Anti-infectives (From admission, onward)   Start     Dose/Rate Route Frequency Ordered Stop   01/29/17 1400  levofloxacin (LEVAQUIN) tablet 750 mg     750 mg Oral Every 24 hours 01/29/17 0920 01/31/17 1359   01/27/17 1400  levofloxacin (LEVAQUIN) IVPB 750 mg  Status:  Discontinued     750 mg 100 mL/hr over 90 Minutes Intravenous Every 48 hours 01/26/17 1544 01/29/17 0920   01/26/17 1200  levofloxacin (LEVAQUIN) IVPB 500 mg     500 mg 100 mL/hr over 60 Minutes Intravenous  Once 01/26/17 1155 01/26/17 1451      Results for orders placed or performed during the hospital encounter of 01/26/17 (from the past 48 hour(s))  Heparin level (unfractionated)     Status: None   Collection Time: 01/27/17  7:32 PM  Result Value Ref Range   Heparin Unfractionated 0.64 0.30 - 0.70 IU/mL    Comment:         IF HEPARIN RESULTS ARE BELOW EXPECTED VALUES, AND PATIENT DOSAGE HAS BEEN CONFIRMED, SUGGEST FOLLOW UP TESTING OF ANTITHROMBIN III LEVELS.   TSH     Status: None   Collection Time: 01/27/17  7:32 PM  Result Value Ref Range   TSH 0.678 0.350 - 4.500 uIU/mL    Comment: Performed by a 3rd Generation assay with a functional sensitivity of <=0.01 uIU/mL.  CBC     Status: Abnormal   Collection Time: 01/28/17  6:06 AM  Result Value Ref Range   WBC 16.1 (H) 4.0 - 10.5 K/uL   RBC 4.20 3.87 - 5.11 MIL/uL   Hemoglobin 12.1 12.0 - 15.0 g/dL   HCT 38.2 36.0 - 46.0 %   MCV 91.0 78.0 - 100.0 fL   MCH 28.8 26.0 - 34.0 pg   MCHC 31.7 30.0 - 36.0 g/dL   RDW 15.1 11.5 - 15.5 %   Platelets 200 150 - 400 K/uL  Basic metabolic panel     Status: Abnormal   Collection Time: 01/28/17  6:06 AM  Result Value Ref Range   Sodium 139 135 - 145 mmol/L   Potassium 3.8 3.5 - 5.1 mmol/L   Chloride 108 101 - 111 mmol/L   CO2 23 22 - 32 mmol/L   Glucose, Bld 83 65 - 99 mg/dL   BUN 22 (H) 6 - 20 mg/dL   Creatinine, Ser 0.91 0.44 - 1.00 mg/dL   Calcium 8.4 (L) 8.9 - 10.3 mg/dL   GFR calc non Af Amer 58 (L) >60 mL/min   GFR calc Af Amer >60 >60 mL/min    Comment: (NOTE) The eGFR has been calculated using the CKD EPI equation. This calculation has not been validated in all clinical situations. eGFR's persistently <60 mL/min signify possible Chronic Kidney Disease.    Anion gap 8 5 - 15  Heparin level (unfractionated)     Status: None   Collection Time: 01/28/17  6:06 AM  Result Value Ref Range   Heparin Unfractionated 0.68 0.30 - 0.70 IU/mL    Comment:        IF HEPARIN RESULTS  ARE BELOW EXPECTED VALUES, AND PATIENT DOSAGE HAS BEEN CONFIRMED, SUGGEST FOLLOW UP TESTING OF ANTITHROMBIN III LEVELS.   CBC     Status: Abnormal   Collection Time: 01/29/17  7:37 AM  Result Value Ref Range   WBC 12.3 (H) 4.0 - 10.5 K/uL   RBC 4.01 3.87 - 5.11 MIL/uL   Hemoglobin 11.5 (L) 12.0 - 15.0 g/dL   HCT 35.9  (L) 36.0 - 46.0 %   MCV 89.5 78.0 - 100.0 fL   MCH 28.7 26.0 - 34.0 pg   MCHC 32.0 30.0 - 36.0 g/dL   RDW 14.5 11.5 - 15.5 %   Platelets 216 150 - 400 K/uL  Basic metabolic panel     Status: Abnormal   Collection Time: 01/29/17  7:37 AM  Result Value Ref Range   Sodium 138 135 - 145 mmol/L   Potassium 3.2 (L) 3.5 - 5.1 mmol/L   Chloride 107 101 - 111 mmol/L   CO2 23 22 - 32 mmol/L   Glucose, Bld 96 65 - 99 mg/dL   BUN 12 6 - 20 mg/dL   Creatinine, Ser 0.82 0.44 - 1.00 mg/dL   Calcium 8.4 (L) 8.9 - 10.3 mg/dL   GFR calc non Af Amer >60 >60 mL/min   GFR calc Af Amer >60 >60 mL/min    Comment: (NOTE) The eGFR has been calculated using the CKD EPI equation. This calculation has not been validated in all clinical situations. eGFR's persistently <60 mL/min signify possible Chronic Kidney Disease.    Anion gap 8 5 - 15  Magnesium     Status: Abnormal   Collection Time: 01/29/17  7:37 AM  Result Value Ref Range   Magnesium 1.6 (L) 1.7 - 2.4 mg/dL    Dg Abd 1 View  Result Date: 01/29/2017 CLINICAL DATA:  Persistent abdominal distension for the past 4 days. The patient had initial abdominal pain and nausea. EXAM: ABDOMEN - 1 VIEW COMPARISON:  KUB of January 27, 2017, and abdominal and pelvic CT scan and KUB of January 26, 2017. FINDINGS: There are numerous loops of minimally distended gas-filled small bowel in the left and mid abdomen. There is some stool and gas within normal caliber colon and rectum. There are no free extraluminal gas collections. There surgical clips in the gallbladder fossa. There are degenerative changes of the lumbar spine and both hips. Contrast demonstrated in the colon on yesterday's study has passed. IMPRESSION: Persistent partial distal small bowel obstruction. No evidence of perforation. Electronically Signed   By: David  Martinique M.D.   On: 01/29/2017 11:00   Dg Abd Portable 1v-small Bowel Obstruction Protocol-initial, 8 Hr Delay  Result Date: 01/28/2017 CLINICAL  DATA:  Small-bowel obstruction, 8 hour delayed image. EXAM: PORTABLE ABDOMEN - 1 VIEW COMPARISON:  01/26/2017 CT FINDINGS: Enteric contrast is seen within large bowel to the level of the rectum with scattered colonic diverticulosis. Mild gaseous distention of small bowel without contrast is noted centrally. Findings likely represent a partial SBO. No free air is noted. Degenerative changes are seen along the dorsal spine and both hips. Cholecystectomy clips are seen the right upper quadrant. IMPRESSION: Contrast is noted within large bowel to level of the rectum. Given mild gaseous distention of small bowel, findings likely represented a partial SBO. Electronically Signed   By: Ashley Royalty M.D.   On: 01/28/2017 03:05    Review of Systems  Constitutional: Positive for fever.  HENT: Negative.   Eyes: Negative.   Respiratory: Positive for cough  and sputum production. Negative for hemoptysis, shortness of breath and wheezing.   Cardiovascular: Negative.   Gastrointestinal: Positive for abdominal pain, heartburn, nausea and vomiting. Negative for blood in stool, constipation, diarrhea and melena.  Genitourinary: Negative.   Musculoskeletal: Negative.   Skin: Negative.   Neurological: Positive for weakness.  Endo/Heme/Allergies: Negative.   Psychiatric/Behavioral: Negative.    Blood pressure (!) 168/70, pulse 73, temperature 99.1 F (37.3 C), temperature source Oral, resp. rate 17, height _0  (1.651 m), weight 72.5 kg (159 lb 13.3 oz), SpO2 91 %. Physical Exam  Constitutional: She is oriented to person, place, and time. She appears well-developed and well-nourished. No distress.  HENT:  Head: Normocephalic and atraumatic.  Mouth/Throat: Oropharynx is clear and moist. No oropharyngeal exudate.  Eyes: Pupils are equal, round, and reactive to light.  Pupils are equal  Neck: Normal range of motion. Neck supple. No JVD present. No tracheal deviation present. No thyromegaly present.  Left carotid  bruit  Cardiovascular: Normal rate, regular rhythm, normal heart sounds and intact distal pulses.  No murmur heard. Respiratory: Effort normal and breath sounds normal. No respiratory distress. She has no wheezes. She has no rales. She exhibits no tenderness.  GI: Soft. Bowel sounds are normal. She exhibits no distension and no mass. There is no tenderness. There is no rebound and no guarding.  Musculoskeletal: She exhibits no edema or tenderness.  Lymphadenopathy:    She has no cervical adenopathy.  Neurological: She is alert and oriented to person, place, and time. No cranial nerve deficit.  Skin: Skin is warm and dry. No rash noted. She is not diaphoretic. No erythema. No pallor.  Psychiatric: She has a normal mood and affect. Her behavior is normal. Judgment and thought content normal.    Assessment/Plan: COPD/probable community-acquired pneumonia. Partial small bowel obstruction versus ileus. Atrial fibrillation new-to be started on chronic anticoagulation. Mild acute kidney injury. Hypertension   FEN: Full liquids ID: Levaquin 01/26/17 -day 4 DVT: SCDs   Foley: None Follow-up:  Primary care  Plan: Patient is currently on a full liquid diet.  She says she has had 2 bowel movements today.  Her abdomen is soft nontender with no distention.  We would recommend continuing full liquids currently and recheck her film the a.m. before advancing her diet.  This is most likely an ileus secondary to her pulmonary issues.  We will see again in the a.m. continue her potassium replacement and get her potassium to to 4.0.  Magnesium up to 1.8.   [name] 01/29/2017, 2:00 PM

## 2017-01-29 NOTE — Discharge Instructions (Addendum)
Soft-Food Meal Plan Follow for 7 days after discharge Eat small frequent meals, avoid large, heavy meals A soft-food meal plan includes foods that are safe and easy to swallow. This meal plan typically is used:  If you are having trouble chewing or swallowing foods.  As a transition meal plan after only having had liquid meals for a long period.  What do I need to know about the soft-food meal plan? A soft-food meal plan includes tender foods that are soft and easy to chew and swallow. In most cases, bite-sized pieces of food are easier to swallow. A bite-sized piece is about  inch or smaller. Foods in this plan do not need to be ground or pureed. Foods that are very hard, crunchy, or sticky should be avoided. Also, breads, cereals, yogurts, and desserts with nuts, seeds, or fruits should be avoided. What foods can I eat? Grains Rice and wild rice. Moist bread, dressing, pasta, and noodles. Well-moistened dry or cooked cereals, such as farina (cooked wheat cereal), oatmeal, or grits. Biscuits, breads, muffins, pancakes, and waffles that have been well moistened. Vegetables Shredded lettuce. Cooked, tender vegetables, including potatoes without skins. Vegetable juices. Broths or creamed soups made with vegetables that are not stringy or chewy. Strained tomatoes (without seeds). Fruits Canned or well-cooked fruits. Soft (ripe), peeled fresh fruits, such as peaches, nectarines, kiwi, cantaloupe, honeydew melon, and watermelon (without seeds). Soft berries with small seeds, such as strawberries. Fruit juices (without pulp). Meats and Other Protein Sources Moist, tender, lean beef. Mutton. Lamb. Veal. Chicken. [place]. Liver. Ham. Fish without bones. Eggs. Dairy Milk, milk drinks, and cream. Plain cream cheese and cottage cheese. Plain yogurt. Sweets/Desserts Flavored gelatin desserts. Custard. Plain ice cream, frozen yogurt, sherbet, milk shakes, and malts. Plain cakes and cookies. Plain hard  candy. Other Butter, margarine (without trans fat), and cooking oils. Mayonnaise. Cream sauces. Mild spices, salt, and sugar. Syrup, molasses, honey, and jelly. The items listed above may not be a complete list of recommended foods or beverages. Contact your dietitian for more options. What foods are not recommended? Grains Dry bread, toast, crackers that have not been moistened. Coarse or dry cereals, such as bran, granola, and shredded wheat. Tough or chewy crusty breads, such as [place] bread or baguettes. Vegetables Corn. Raw vegetables except shredded lettuce. Cooked vegetables that are tough or stringy. Tough, crisp, fried potatoes and potato skins. Fruits Fresh fruits with skins or seeds or both, such as apples, pears, or grapes. Stringy, high-pulp fruits, such as papaya, pineapple, coconut, or mango. Fruit leather, fruit roll-ups, and all dried fruits. Meats and Other Protein Sources Sausages and hot dogs. Meats with gristle. Fish with bones. Nuts, seeds, and chunky peanut or other nut butters. Sweets/Desserts Cakes or cookies that are very dry or chewy. The items listed above may not be a complete list of foods and beverages to avoid. Contact your dietitian for more information. This information is not intended to replace advice given to you by your health care provider. Make sure you discuss any questions you have with your health care provider. Document Released: 04/17/2007 Document Revised: 06/16/2015 Document Reviewed: 12/05/2012 Elsevier Interactive Patient Education  2017 [employer]   Information on my medicine - ELIQUIS (apixaban)  This medication education was reviewed with me or my healthcare representative as part of my discharge preparation.    Why was Eliquis prescribed for you? Eliquis was prescribed for you to reduce the risk of a blood clot forming that can cause a stroke if  you have a medical condition called atrial fibrillation (a type of irregular  heartbeat).  What do You need to know about Eliquis ? Take your Eliquis TWICE DAILY - one tablet in the morning and one tablet in the evening with or without food. If you have difficulty swallowing the tablet whole please discuss with your pharmacist how to take the medication safely.  Take Eliquis exactly as prescribed by your doctor and DO NOT stop taking Eliquis without talking to the doctor who prescribed the medication.  Stopping may increase your risk of developing a stroke.  Refill your prescription before you run out.  After discharge, you should have regular check-up appointments with your healthcare provider that is prescribing your Eliquis.  In the future your dose may need to be changed if your kidney function or weight changes by a significant amount or as you get older.  What do you do if you miss a dose? If you miss a dose, take it as soon as you remember on the same day and resume taking twice daily.  Do not take more than one dose of ELIQUIS at the same time to make up a missed dose.  Important Safety Information A possible side effect of Eliquis is bleeding. You should call your healthcare provider right away if you experience any of the following: ? Bleeding from an injury or your nose that does not stop. ? Unusual colored urine (red or dark brown) or unusual colored stools (red or black). ? Unusual bruising for unknown reasons. ? A serious fall or if you hit your head (even if there is no bleeding).  Some medicines may interact with Eliquis and might increase your risk of bleeding or clotting while on Eliquis. To help avoid this, consult your healthcare provider or pharmacist prior to using any new prescription or non-prescription medications, including herbals, vitamins, non-steroidal anti-inflammatory drugs (NSAIDs) and supplements.  This website has more information on Eliquis (apixaban): http://www.eliquis.com/eliquis/home

## 2017-01-29 NOTE — Progress Notes (Addendum)
PROGRESS NOTE    Lori PontoCarol S Frey  FAO:[id number] DOB: 01/01/1935 DOA: 01/26/2017 PCP: Blair HeysEhinger, Robert, MD   Brief Narrative:  [name] is [name] 83 y.o. female with medical history significant of hypertension, COPD, presented to emergency department with main concern of several days duration of progressively worsening nausea and multiple episodes of non bloody vomiting in the past 24 hours. Patient reports this was associated with diffuse abdominal pain, throbbing and constant, 7/10 in severity, non radiating, worse with drinking or eating, no specific alleviating factors. She was unable to keep anything down. Patient also reports noticing exertional dyspnea and mixed episodes of nonproductive and productive cough of clear sputum that started one day prior to this admission. Patient denies chest pain, no fevers or chills, no urinary concerns. Patient also denies any headaches or visual changes, no focal neurological symptoms such as numbness or tingling, no focal weakness.  Assessment & Plan:   Active Problems:   SBO (small bowel obstruction) (HCC)   Dyspnea, hx of COPD  CAP -  Now on room air -  Mild patch LLL opacities on XR, atelectasis vs pneumonia and CT abd pelvis with contrast with medial R LL patchy ground glass opacities (aspiration vs infection) - pt has underlying COPD but no wheezing noted on exam at this time - Continue levaquin - Negative urine strep, negative legionella.  Pending sputum cx. - blood cx NGTD x 2 days  Nausea and vomiting  Partial SBO - CT abdomen pelvis with multiple fluid filled mildly dilated loops throughout central abdomen, transiion to decompressed small bowel within anterior lower abdomen - concern for early or partial SBO (also fluid in pelvis and mesentery) - exam without tenderness - Will do small bowel protocol -> contrast within large bowel and to level of rectum - advance to full liquids today [ ]  repeat abdominal plain film today - also place on  bowel regimen   Atrial Fibrillation: No previous history.  chadvasc 5.  Appreciate cards recs.  Echo (EF 60-65%, grade 2 dd, elevate PAP - see report), normal TSH.  Likely induced with acute illness.  Continue atenol Cardiology c/s, appreciate recs -> eliquis  Leukocytosis - improved - follow blood cx, continue abx   Mild AKI - improving   Hypertension - Continue home medical regimen with amlodipine, atenolol, losartan  Collagenous Colitis: continue prednisone, discussed with Dr. Elnoria HowardHung  DVT prophylaxis: heparin gtt Code Status: full  Family Communication: none at bedside Disposition Plan: pending improvement   Consultants:   cardiology  Procedures:   None, echo and small bowel protocol pending  Antimicrobials:  Anti-infectives (From admission, onward)   Start     Dose/Rate Route Frequency Ordered Stop   01/29/17 1400  levofloxacin (LEVAQUIN) tablet 750 mg     750 mg Oral Every 24 hours 01/29/17 0920 01/31/17 1359   01/27/17 1400  levofloxacin (LEVAQUIN) IVPB 750 mg  Status:  Discontinued     750 mg 100 mL/hr over 90 Minutes Intravenous Every 48 hours 01/26/17 1544 01/29/17 0920   01/26/17 1200  levofloxacin (LEVAQUIN) IVPB 500 mg     500 mg 100 mL/hr over 60 Minutes Intravenous  Once 01/26/17 1155 01/26/17 1451        Subjective: Doesn't feel as well as she'd expect. Some nausea last night. Some small BM yesterday (smears).  Passing flatus.  Objective: Vitals:   01/28/17 0441 01/28/17 2013 01/29/17 0435 01/29/17 0522  BP: (!) 152/66 (!) 156/61 (!) 182/81 (!) 168/70  Pulse: 76  66 73   Resp: 17     Temp: 97.9 F (36.6 C) 98.5 F (36.9 C) 99.1 F (37.3 C)   TempSrc: Oral Oral Oral   SpO2: 91% 92% 91%   Weight:      Height:        Intake/Output Summary (Last 24 hours) at 01/29/2017 1009 Last data filed at 01/29/2017 0918 Gross per 24 hour  Intake 3620 ml  Output -  Net 3620 ml   Filed Weights   01/26/17 0842 01/26/17 2031  Weight: 72.6 kg (160  lb) 72.5 kg (159 lb 13.3 oz)    Examination:  General: No acute distress. Cardiovascular: Heart sounds show Braylon Lemmons regular rate, and rhythm. No gallops or rubs. No murmurs. No JVD. Lungs: Clear to auscultation bilaterally with good air movement. No rales, rhonchi or wheezes. Abdomen: Soft, nontender, nondistended with quiet bowel sounds. No masses. No hepatosplenomegaly. Neurological: Alert and oriented 3. Moves all extremities 4 with equal strength. Cranial nerves II through XII grossly intact. Skin: Warm and dry. No rashes or lesions. Extremities: No clubbing or cyanosis. No edema. Pedal pulses 2+. Psychiatric: Mood and affect are normal. Insight and judgment are appropriate.  Data Reviewed: I have personally reviewed following labs and imaging studies  CBC: Recent Labs  Lab 01/26/17 0949 01/26/17 1621 01/27/17 0501 01/28/17 0606 01/29/17 0737  WBC 18.7* 22.8* 25.8* 16.1* 12.3*  NEUTROABS 15.7*  --   --   --   --   HGB 14.6 14.5 12.4 12.1 11.5*  HCT 44.1 44.4 39.4 38.2 35.9*  MCV 88.7 89.5 91.0 91.0 89.5  PLT 233 204 213 200 216   Basic Metabolic Panel: Recent Labs  Lab 01/26/17 0949 01/26/17 1621 01/27/17 0501 01/28/17 0606 01/29/17 0737  NA 136  --  138 139 138  K 3.5  --  4.5 3.8 3.2*  CL 96*  --  104 108 107  CO2 28  --  26 23 23   GLUCOSE 150*  --  119* 83 96  BUN 23*  --  22* 22* 12  CREATININE 1.09* 1.09* 1.02* 0.91 0.82  CALCIUM 9.3  --  8.3* 8.4* 8.4*   GFR: Estimated Creatinine Clearance: 54.6 mL/min (by C-G formula based on SCr of 0.82 mg/dL). Liver Function Tests: Recent Labs  Lab 01/26/17 0949  AST 19  ALT 14  ALKPHOS 74  BILITOT 0.9  PROT 7.3  ALBUMIN 4.0   Recent Labs  Lab 01/26/17 0949  LIPASE 23   No results for input(s): AMMONIA in the last 168 hours. Coagulation Profile: Recent Labs  Lab 01/27/17 1121  INR 1.31   Cardiac Enzymes: No results for input(s): CKTOTAL, CKMB, CKMBINDEX, TROPONINI in the last 168 hours. BNP (last 3  results) No results for input(s): PROBNP in the last 8760 hours. HbA1C: No results for input(s): HGBA1C in the last 72 hours. CBG: No results for input(s): GLUCAP in the last 168 hours. Lipid Profile: No results for input(s): CHOL, HDL, LDLCALC, TRIG, CHOLHDL, LDLDIRECT in the last 72 hours. Thyroid Function Tests: Recent Labs    01/27/17 1932  TSH 0.678   Anemia Panel: No results for input(s): VITAMINB12, FOLATE, FERRITIN, TIBC, IRON, RETICCTPCT in the last 72 hours. Sepsis Labs: No results for input(s): PROCALCITON, LATICACIDVEN in the last 168 hours.  Recent Results (from the past 240 hour(s))  Culture, blood (routine x 2) Call MD if unable to obtain prior to antibiotics being given     Status: None (Preliminary result)   Collection  Time: 01/26/17  4:10 PM  Result Value Ref Range Status   Specimen Description BLOOD RIGHT ARM  Final   Special Requests IN PEDIATRIC BOTTLE Blood Culture adequate volume  Final   Culture   Final    NO GROWTH 2 DAYS Performed at [facility], [address], [place], [place] [address]    Report Status PENDING  Incomplete  Culture, blood (routine x 2) Call MD if unable to obtain prior to antibiotics being given     Status: None (Preliminary result)   Collection Time: 01/26/17  4:10 PM  Result Value Ref Range Status   Specimen Description BLOOD LEFT HAND  Final   Special Requests   Final    BOTTLES DRAWN AEROBIC AND ANAEROBIC Blood Culture adequate volume   Culture   Final    NO GROWTH 2 DAYS Performed at [facility], [address], [place], [place] [address]    Report Status PENDING  Incomplete         Radiology Studies: Dg Abd Portable 1v-small Bowel Obstruction Protocol-initial, 8 Hr Delay  Result Date: 01/28/2017 CLINICAL DATA:  Small-bowel obstruction, 8 hour delayed image. EXAM: PORTABLE ABDOMEN - 1 VIEW COMPARISON:  01/26/2017 CT FINDINGS: Enteric contrast is seen within large bowel to the level of the rectum with  scattered colonic diverticulosis. Mild gaseous distention of small bowel without contrast is noted centrally. Findings likely represent Amal Saiki partial SBO. No free air is noted. Degenerative changes are seen along the dorsal spine and both hips. Cholecystectomy clips are seen the right upper quadrant. IMPRESSION: Contrast is noted within large bowel to level of the rectum. Given mild gaseous distention of small bowel, findings likely represented Baer Hinton partial SBO. Electronically Signed   By: Tollie Eth M.D.   On: 01/28/2017 03:05        Scheduled Meds: . amLODipine  5 mg Oral Q breakfast  . apixaban  5 mg Oral Q12H  . atenolol  25 mg Oral Q breakfast  . levofloxacin  750 mg Oral Q24H  . LORazepam  0.5 mg Oral Q breakfast  . LORazepam  1 mg Oral Daily  . losartan  100 mg Oral Q breakfast  . pantoprazole  40 mg Oral Daily  . polyethylene glycol  17 g Oral BID  . potassium chloride  40 mEq Oral Q4H  . predniSONE  10 mg Oral Q breakfast  . sennosides  5 mL Oral BID  . sertraline  100 mg Oral QHS  . sodium chloride  1 spray Each Nare BID   Continuous Infusions: . sodium chloride 75 mL/hr at 01/29/17 0519     LOS: 3 days    Time spent: over 30 min    Lacretia Nicks, MD [facility] Pager over 30 min  If 7PM-7AM, please contact night-coverage [email or web address] Password [email or web address] 01/29/2017, 10:09 AM

## 2017-01-29 NOTE — Care Management Important Message (Signed)
Important Message  Patient Details  Name: [name] MRN: [id number] Date of Birth: 1934-11-27   Medicare Important Message Given:  Yes    Caren MacadamFuller, Taron Mondor 01/29/2017, 10:31 AMImportant Message  Patient Details  Name: Lori PontoCarol S Frey MRN: [id number] Date of Birth: 1934-11-27   Medicare Important Message Given:  Yes    Caren MacadamFuller, Zikeria Keough 01/29/2017, 10:31 AM

## 2017-01-30 ENCOUNTER — Inpatient Hospital Stay (HOSPITAL_COMMUNITY): Payer: Medicare Other

## 2017-01-30 DIAGNOSIS — K567 Ileus, unspecified: Secondary | ICD-10-CM

## 2017-01-30 LAB — CBC
HCT: 35.7 % — ABNORMAL LOW (ref 36.0–46.0)
Hemoglobin: 11.5 g/dL — ABNORMAL LOW (ref 12.0–15.0)
MCH: 28.7 pg (ref 26.0–34.0)
MCHC: 32.2 g/dL (ref 30.0–36.0)
MCV: 89 fL (ref 78.0–100.0)
Platelets: 233 10*3/uL (ref 150–400)
RBC: 4.01 MIL/uL (ref 3.87–5.11)
RDW: 14.5 % (ref 11.5–15.5)
WBC: 12.2 10*3/uL — ABNORMAL HIGH (ref 4.0–10.5)

## 2017-01-30 LAB — BASIC METABOLIC PANEL
Anion gap: 7 (ref 5–15)
BUN: 8 mg/dL (ref 6–20)
CALCIUM: 8.5 mg/dL — AB (ref 8.9–10.3)
CO2: 24 mmol/L (ref 22–32)
CREATININE: 0.85 mg/dL (ref 0.44–1.00)
Chloride: 105 mmol/L (ref 101–111)
GFR calc non Af Amer: >60 mL/min (ref >60)
Glucose, Bld: 106 mg/dL — ABNORMAL HIGH (ref 65–99)
Potassium: 4 mmol/L (ref 3.5–5.1)
SODIUM: 136 mmol/L (ref 135–145)

## 2017-01-30 LAB — MAGNESIUM: Magnesium: 1.7 mg/dL (ref 1.7–2.4)

## 2017-01-30 MED ORDER — APIXABAN 5 MG PO TABS: 5.0000 mg | ORAL_TABLET | Freq: Two times a day (BID) | ORAL | 0 refills | Status: DC

## 2017-01-30 MED ORDER — POLYETHYLENE GLYCOL 3350 17 GM/SCOOP PO POWD: 17.0000 g | Freq: Every day | ORAL | 0 refills | Status: DC

## 2017-01-30 NOTE — Care Management Note (Signed)
Case Management Note  Patient Details  Name: [name] MRN: [id number] Date of Birth: 1934/05/27  Subjective/Objective:                    Action/Plan:d/c home.   Expected Discharge Date:  01/30/17               Expected Discharge Plan:  Home/Self Care  In-House Referral:     Discharge planning Services  CM Consult  Post Acute Care Choice:    Choice offered to:     DME Arranged:    DME Agency:     HH Arranged:    HH Agency:     Status of Service:  Completed, signed off  If discussed at [employer] Length of Stay Meetings, dates discussed:    Additional Comments:  Lanier ClamMahabir, Wilda Wetherell, RN 01/30/2017, 1:29 PM

## 2017-01-30 NOTE — Final Consult Note (Signed)
Consultant Final Sign-Off Note    Assessment/Final recommendations  [name] is a 83 y.o. female followed by me for SBO. If pt tolerates soft diet for lunch she is okay for discharge from a surgical standpoint.   Wound care (if applicable):    Diet at discharge: soft diet for 7 days, small frequent meals, avoid large meals   Activity at discharge: per primary team   Follow-up appointment:  None needed   Pending results:  Unresulted Labs (From admission, onward)   Start     Ordered   01/28/17 0500  CBC  Daily,   R     01/27/17 1057   01/26/17 1544  Culture, sputum-assessment  Once,   R     01/26/17 1544   01/26/17 1544  Gram stain  Once,   R     01/26/17 1544       Medication recommendations:   Other recommendations:    Thank you for allowing [place] to participate in the care of your patient!  Please consult [place] again if you have further needs for your patient.  Jerre Simon 01/30/2017 11:32 AM    Subjective   CC: SBO  Pt states she has had 4 small soft BM's. She is tolerating full liquids. She is anxious to go home to her dog. No nausea or vomiting.  Objective  Vital signs in last 24 hours: Temp:  [97.9 F (36.6 C)-98.9 F (37.2 C)] 97.9 F (36.6 C) (01/09 0502) Pulse Rate:  [65-79] 79 (01/09 0502) Resp:  [18] 18 (01/09 0502) BP: (144-167)/(60-65) 167/63 (01/09 0502) SpO2:  [92 %-94 %] 94 % (01/09 0502)  PE: General: alert, NAD, pleasant, well appearing Lungs: rate and effort normal Cardio: RRR, no murmur noted GI: soft, no distention, + BS, no TTP Skin: warm and dry   Pertinent labs and Studies: Recent Labs    01/28/17 0606 01/29/17 0737 01/30/17 0517  WBC 16.1* 12.3* 12.2*  HGB 12.1 11.5* 11.5*  HCT 38.2 35.9* 35.7*   BMET Recent Labs    01/29/17 0737 01/30/17 0517  NA 138 136  K 3.2* 4.0  CL 107 105  CO2 23 24  GLUCOSE 96 106*  BUN 12 8  CREATININE 0.82 0.85  CALCIUM 8.4* 8.5*   No results for input(s): LABURIN in the last 72  hours. Results for orders placed or performed during the hospital encounter of 01/26/17  Culture, blood (routine x 2) Call MD if unable to obtain prior to antibiotics being given     Status: None (Preliminary result)   Collection Time: 01/26/17  4:10 PM  Result Value Ref Range Status   Specimen Description BLOOD RIGHT ARM  Final   Special Requests IN PEDIATRIC BOTTLE Blood Culture adequate volume  Final   Culture   Final    NO GROWTH 3 DAYS Performed at [facility], [address], [place], [place] [address]    Report Status PENDING  Incomplete  Culture, blood (routine x 2) Call MD if unable to obtain prior to antibiotics being given     Status: None (Preliminary result)   Collection Time: 01/26/17  4:10 PM  Result Value Ref Range Status   Specimen Description BLOOD LEFT HAND  Final   Special Requests   Final    BOTTLES DRAWN AEROBIC AND ANAEROBIC Blood Culture adequate volume   Culture   Final    NO GROWTH 3 DAYS Performed at [facility], [address], [place],  [place] [id number]    Report Status PENDING  Incomplete    Imaging: Dg Abd 2 Views  Result Date: 01/30/2017 CLINICAL DATA:  Follow-up of small bowel obstruction. EXAM: ABDOMEN - 2 VIEW COMPARISON:  Abdominal radiograph of January 29, 2017 FINDINGS: The degree of distention of the small bowel loops has decreased. A small amount of small bowel distention persists in the mid abdomen. There is somewhat more gas within the normal caliber colon and rectum. There surgical clips in the gallbladder fossa. There are degenerative changes of the lumbar spine and both hips. IMPRESSION: Resolving small-bowel obstruction with more gas in the colon today. No evidence of perforation. Electronically Signed   By: David  Swazil[place] M.D.   On: 01/30/2017 10:26

## 2017-01-30 NOTE — Discharge Summary (Signed)
Physician Discharge Summary  [name] VWU:[id number] DOB: Aug 14, 1934 DOA: 01/26/2017  PCP: Blair Heys, MD  Admit date: 01/26/2017 Discharge date: 01/30/2017  Admitted From: home  Disposition:  home  Recommendations for Outpatient Follow-up:  1. Follow up with PCP in 1-2 weeks regarding pneumonia and pSBO symptoms 2. Follow-up with cardiology on 1/21 regarding new onset atrial fibrillation and anticoagulation  Home Health:  none  Equipment/Devices:  none  Discharge Condition:  Stable, improved CODE STATUS:  Full code  Diet recommendation: Soft diet for 7 days, small frequent meals  Brief/Interim Summary:  The patient is an 83 year old female with history of hypertension, COPD who presented to the emergency department with several days of nausea, vomiting, diffuse abdominal pain.  There is exertional dyspnea with mixed episodes of cough.  She was found to have community-acquired pneumonia with either partial bowel obstruction versus ileus.  She was started on antibiotics for LLL and RLL pneumonia and was seen by general surgery.  Her partial bowel obstruction resolved with conservative measures and she was able to tolerate a soft mechanical diet at the time of discharge.  She was incidentally diagnosed with new onset atrial fibrillation.  Cardiology was consulted.  Her TSH was normal and she was started on atenolol and Eliquis.  Discharge Diagnoses:  Active Problems:   SBO (small bowel obstruction) (HCC)  CAP in the setting of COPD, improved with levofloxacin and stable on room air at the time of discharge - pt has underlying COPD but no wheezing noted on exam at this time - Negative urine strep, negative legionella - blood cx NGTD  Partial SBO with nausea and vomiting.  She improved with conservative measures.  She underwent serial x-rays which demonstrated gradually improving partial small bowel obstruction.  She also underwent a small bowel protocol which showed contrast within  the large bowel to the level of the rectum.  Her diet was gradually advanced and she was able to tolerate a soft mechanical diet prior to discharge.  She will not need general surgery or GI follow-up following this episode.  New onset atrial Fibrillation, likely related to acute illness. Chadvasc 5.    Cardiology was consulted.  Echo (EF 60-65%, grade 2 dd, elevate PAP - see report), normal TSH.    She was continued on atenolol and started on Eliquis.  She converted to NSR prior to discharge.    Leukocytosis secondary to community-acquired pneumonia, improved with antibiotics.  Mild AKI, resolved with IVF  Hypertension, blood pressure mildly elevated, continued amlodipine, atenolol, losartan  Collagenous Colitis, discussed with Dr. Elnoria Howard.  Continued prednisone.   Discharge Instructions  Discharge Instructions    Call MD for:  difficulty breathing, headache or visual disturbances   Complete by:  As directed    Call MD for:  extreme fatigue   Complete by:  As directed    Call MD for:  persistant dizziness or light-headedness   Complete by:  As directed    Call MD for:  persistant nausea and vomiting   Complete by:  As directed    Call MD for:  severe uncontrolled pain   Complete by:  As directed    Call MD for:  temperature >100.4   Complete by:  As directed    Diet - low sodium heart healthy   Complete by:  As directed    Increase activity slowly   Complete by:  As directed      Allergies as of 01/30/2017      Reactions  Amoxicillin Nausea Only, Rash   Has patient had a PCN reaction causing immediate rash, facial/tongue/throat swelling, SOB or lightheadedness with hypotension: Yes Has patient had a PCN reaction causing severe rash involving mucus membranes or skin necrosis: No Has patient had a PCN reaction that required hospitalization No Has patient had a PCN reaction occurring within the last 10 years: No If all of the above answers are "NO", then may proceed with  Cephalosporin use.   Tetracyclines & Related Nausea Only, Rash      Medication List    STOP taking these medications   aspirin EC 81 MG tablet   nitroGLYCERIN 0.2 mg/hr patch Commonly known as:  NITRODUR - Dosed in mg/24 hr     TAKE these medications   acetaminophen 500 MG tablet Commonly known as:  TYLENOL Take 1,000 mg by mouth every 6 (six) hours as needed for mild pain, moderate pain, fever or headache.   amLODipine 5 MG tablet Commonly known as:  NORVASC Take 5 mg by mouth daily with breakfast.   apixaban 5 MG Tabs tablet Commonly known as:  ELIQUIS Take 1 tablet (5 mg total) by mouth every 12 (twelve) hours.   atenolol 25 MG tablet Commonly known as:  TENORMIN Take 25 mg by mouth daily with breakfast.   LORazepam 1 MG tablet Commonly known as:  ATIVAN Take 0.5-1 mg by mouth 2 (two) times daily. Takes 0.5mg  right when she gets up for the day and then a little while later she takes 1mg    losartan 100 MG tablet Commonly known as:  COZAAR Take 100 mg by mouth daily with breakfast.   omeprazole 40 MG capsule Commonly known as:  PRILOSEC Take 40 mg by mouth daily with breakfast.   ondansetron 8 MG disintegrating tablet Commonly known as:  ZOFRAN ODT Take 1 tablet (8 mg total) by mouth every 8 (eight) hours as needed for nausea or vomiting.   polyethylene glycol powder powder Commonly known as:  MIRALAX Take 17 g by mouth daily.   predniSONE 10 MG tablet Commonly known as:  DELTASONE Take 10 mg by mouth daily.   sertraline 100 MG tablet Commonly known as:  ZOLOFT Take 100 mg by mouth at bedtime.   SM CORAL CALCIUM 1000 (390 Ca) MG Tabs Generic drug:  Coral Calcium Take 1,000 mg by mouth at bedtime.   sodium chloride 0.65 % Soln nasal spray Commonly known as:  OCEAN Place 1 spray into both nostrils 2 (two) times daily.      Follow-up Information    Jodelle Gross, NP Follow up on 02/11/2017.   Specialties:  Nurse Practitioner, Radiology,  Cardiology Why:  Please arrive 15 minutes early for you 10:00am appointment Contact information: [address] [place] [address] [phone]        Blair Heys, MD. Schedule an appointment as soon as possible for a visit in 2 week(s).   Specialty:  Family Medicine Contact information: [address] [place] [address] [phone]        Lewayne Bunting, MD .   Specialty:  Cardiology Contact information: [address] [place] [address] [phone]          Allergies  Allergen Reactions  . Amoxicillin Nausea Only and Rash    Has patient had a PCN reaction causing immediate rash, facial/tongue/throat swelling, SOB or lightheadedness with hypotension: Yes Has patient had a PCN reaction causing severe rash involving mucus membranes or skin necrosis:  No Has patient had a PCN reaction that required hospitalization No Has patient had a PCN reaction occurring within the last 10 years: No If all of the above answers are "NO", then may proceed with Cephalosporin use.   . Tetracyclines & Related Nausea Only and Rash    Consultations:  Cardiology, Dr. Jens Som General surgery, Chevis Pretty III   Procedures/Studies: Dg Abd 1 View  Result Date: 01/29/2017 CLINICAL DATA:  Persistent abdominal distension for the past 4 days. The patient had initial abdominal pain and nausea. EXAM: ABDOMEN - 1 VIEW COMPARISON:  KUB of January 27, 2017, and abdominal and pelvic CT scan and KUB of January 26, 2017. FINDINGS: There are numerous loops of minimally distended gas-filled small bowel in the left and mid abdomen. There is some stool and gas within normal caliber colon and rectum. There are no free extraluminal gas collections. There surgical clips in the gallbladder fossa. There are degenerative changes of the lumbar spine and both hips. Contrast demonstrated in the colon on yesterday's study has passed. IMPRESSION: Persistent partial  distal small bowel obstruction. No evidence of perforation. Electronically Signed   By: David  [place] M.D.   On: 01/29/2017 11:00   Ct Abdomen Pelvis W Contrast  Result Date: 01/26/2017 CLINICAL DATA:  Abdominal pain. Nausea and vomiting. Patient with nausea and abdominal pain. EXAM: CT ABDOMEN AND PELVIS WITH CONTRAST TECHNIQUE: Multidetector CT imaging of the abdomen and pelvis was performed using the standard protocol following bolus administration of intravenous contrast. CONTRAST:  ISOVUE-300 IOPAMIDOL (ISOVUE-300) INJECTION 61% COMPARISON:  CT abdomen 03/29/2004. FINDINGS: Lower chest: Normal heart size. Subpleural opacities within the left-greater-than-right lower lobes favored to represent atelectasis. Patchy ground-glass opacities within the medial right lower lobe. Hepatobiliary: The liver is normal in size and contour. Patient status post cholecystectomy. Mild intrahepatic biliary ductal dilatation. Common bile duct measures 6 mm. Pancreas: Unremarkable Spleen: Unremarkable Adrenals/Urinary Tract: The adrenal glands are normal. Kidneys enhance symmetrically with contrast. No hydronephrosis. Urinary bladder is unremarkable. Stomach/Bowel: Descending and sigmoid colonic diverticulosis. No CT evidence for acute diverticulitis. Small hiatal hernia. Normal morphology of the stomach. Multiple fluid-filled and mildly dilated loops of small bowel are demonstrated throughout the central abdomen with transition to decompressed small bowel within the central lower abdomen (image 60 ; series 2). There is a moderate amount of free fluid within the pelvis and small amount of fluid throughout the small bowel mesentery. Vascular/Lymphatic: Normal caliber abdominal aorta. Peripheral calcified atherosclerotic plaque. No retroperitoneal lymphadenopathy. There is atherosclerotic narrowing at the origin of the superior mesenteric artery (image 39; series 5). Reproductive: Uterus is surgically absent. Other: None.  Musculoskeletal: Lumbar spine degenerative changes. No aggressive or acute appearing osseous lesions. IMPRESSION: 1. Multiple fluid filled mildly dilated loops of small bowel throughout the central abdomen transition to decompressed small bowel within the anterior lower abdomen. Findings may represent early and/or partial small bowel obstruction. Additionally there is a small amount of fluid within the small bowel mesentery and a moderate amount of fluid within the pelvis. 2. Atherosclerotic narrowing at the origin of the superior mesenteric artery. 3. Patchy ground-glass opacities within the medial right lower lobe may represent aspiration or infection. 4. Mild central intrahepatic biliary ductal dilatation which may be physiologic status post cholecystectomy. Recommend correlation with LFTs. Electronically Signed   By: Annia Belt M.D.   On: 01/26/2017 11:10   Dg Abd 2 Views  Result Date: 01/30/2017 CLINICAL DATA:  Follow-up of small bowel obstruction. EXAM: ABDOMEN - 2 VIEW  COMPARISON:  Abdominal radiograph of January 29, 2017 FINDINGS: The degree of distention of the small bowel loops has decreased. A small amount of small bowel distention persists in the mid abdomen. There is somewhat more gas within the normal caliber colon and rectum. There surgical clips in the gallbladder fossa. There are degenerative changes of the lumbar spine and both hips. IMPRESSION: Resolving small-bowel obstruction with more gas in the colon today. No evidence of perforation. Electronically Signed   By: David  [place] M.D.   On: 01/30/2017 10:26   Dg Abd Acute W/chest  Result Date: 01/26/2017 CLINICAL DATA:  Vomiting, abdominal pain EXAM: DG ABDOMEN ACUTE W/ 1V CHEST COMPARISON:  09/28/2015 FINDINGS: Mild patchy left lower lobe opacities, atelectasis versus pneumonia. Mild right basilar opacities, likely atelectasis. No pleural effusion or pneumothorax. The heart is normal in size. Nonobstructive bowel gas pattern. No evidence of  free air on the lateral decubitus view. Cholecystectomy clips. Degenerative changes of the visualized thoracolumbar spine. IMPRESSION: Mild patchy left lower lobe opacities, atelectasis versus pneumonia. No evidence of small bowel obstruction or free air. Electronically Signed   By: Charline Bills M.D.   On: 01/26/2017 10:21   Dg Abd Portable 1v-small Bowel Obstruction Protocol-initial, 8 Hr Delay  Result Date: 01/28/2017 CLINICAL DATA:  Small-bowel obstruction, 8 hour delayed image. EXAM: PORTABLE ABDOMEN - 1 VIEW COMPARISON:  01/26/2017 CT FINDINGS: Enteric contrast is seen within large bowel to the level of the rectum with scattered colonic diverticulosis. Mild gaseous distention of small bowel without contrast is noted centrally. Findings likely represent a partial SBO. No free air is noted. Degenerative changes are seen along the dorsal spine and both hips. Cholecystectomy clips are seen the right upper quadrant. IMPRESSION: Contrast is noted within large bowel to level of the rectum. Given mild gaseous distention of small bowel, findings likely represented a partial SBO. Electronically Signed   By: Tollie Eth M.D.   On: 01/28/2017 03:05    Subjective:  Patient states that she would really like to go home today.  She would like to be able to eat more solid foods.  She has been having soft bowel movements that are mushy, but not watery.  She denies significant nausea.  She has been tolerating her full liquids diet.  Discharge Exam: Vitals:   01/29/17 2029 01/30/17 0502  BP: (!) 145/65 (!) 167/63  Pulse: 72 79  Resp: 18 18  Temp: 98.5 F (36.9 C) 97.9 F (36.6 C)  SpO2: 92% 94%   Vitals:   01/29/17 0522 01/29/17 1604 01/29/17 2029 01/30/17 0502  BP: (!) 168/70 (!) 144/60 (!) 145/65 (!) 167/63  Pulse:  65 72 79  Resp:  18 18 18   Temp:  98.9 F (37.2 C) 98.5 F (36.9 C) 97.9 F (36.6 C)  TempSrc:  Oral Oral Oral  SpO2:  92% 92% 94%  Weight:      Height:        General: Pt is  alert, awake, not in acute distress Cardiovascular: RRR, S1/S2 +, no rubs, no gallops Respiratory: CTA bilaterally, no wheezing, no rhonchi Abdominal: Soft, NT, ND, bowel sounds + Extremities: no edema, no cyanosis    The results of significant diagnostics from this hospitalization (including imaging, microbiology, ancillary and laboratory) are listed below for reference.     Microbiology: Recent Results (from the past 240 hour(s))  Culture, blood (routine x 2) Call MD if unable to obtain prior to antibiotics being given     Status: None (Preliminary  result)   Collection Time: 01/26/17  4:10 PM  Result Value Ref Range Status   Specimen Description BLOOD RIGHT ARM  Final   Special Requests IN PEDIATRIC BOTTLE Blood Culture adequate volume  Final   Culture   Final    NO GROWTH 4 DAYS Performed at [facility], [address] [place], [place] [address]    Report Status PENDING  Incomplete  Culture, blood (routine x 2) Call MD if unable to obtain prior to antibiotics being given     Status: None (Preliminary result)   Collection Time: 01/26/17  4:10 PM  Result Value Ref Range Status   Specimen Description BLOOD LEFT HAND  Final   Special Requests   Final    BOTTLES DRAWN AEROBIC AND ANAEROBIC Blood Culture adequate volume   Culture   Final    NO GROWTH 4 DAYS Performed at [facility], [address], [place], [place] [address]    Report Status PENDING  Incomplete     Labs: BNP (last 3 results) No results for input(s): BNP in the last 8760 hours. Basic Metabolic Panel: Recent Labs  Lab 01/26/17 0949 01/26/17 1621 01/27/17 0501 01/28/17 0606 01/29/17 0737 01/30/17 0517  NA 136  --  138 139 138 136  K 3.5  --  4.5 3.8 3.2* 4.0  CL 96*  --  104 108 107 105  CO2 28  --  26 23 23 24   GLUCOSE 150*  --  119* 83 96 106*  BUN 23*  --  22* 22* 12 8  CREATININE 1.09* 1.09* 1.02* 0.91 0.82 0.85  CALCIUM 9.3  --  8.3* 8.4* 8.4* 8.5*  MG  --   --   --   --  1.6* 1.7    Liver Function Tests: Recent Labs  Lab 01/26/17 0949  AST 19  ALT 14  ALKPHOS 74  BILITOT 0.9  PROT 7.3  ALBUMIN 4.0   Recent Labs  Lab 01/26/17 0949  LIPASE 23   No results for input(s): AMMONIA in the last 168 hours. CBC: Recent Labs  Lab 01/26/17 0949 01/26/17 1621 01/27/17 0501 01/28/17 0606 01/29/17 0737 01/30/17 0517  WBC 18.7* 22.8* 25.8* 16.1* 12.3* 12.2*  NEUTROABS 15.7*  --   --   --   --   --   HGB 14.6 14.5 12.4 12.1 11.5* 11.5*  HCT 44.1 44.4 39.4 38.2 35.9* 35.7*  MCV 88.7 89.5 91.0 91.0 89.5 89.0  PLT 233 204 213 200 216 233   Cardiac Enzymes: No results for input(s): CKTOTAL, CKMB, CKMBINDEX, TROPONINI in the last 168 hours. BNP: Invalid input(s): POCBNP CBG: No results for input(s): GLUCAP in the last 168 hours. D-Dimer No results for input(s): DDIMER in the last 72 hours. Hgb A1c No results for input(s): HGBA1C in the last 72 hours. Lipid Profile No results for input(s): CHOL, HDL, LDLCALC, TRIG, CHOLHDL, LDLDIRECT in the last 72 hours. Thyroid function studies Recent Labs    01/27/17 1932  TSH 0.678   Anemia work up No results for input(s): VITAMINB12, FOLATE, FERRITIN, TIBC, IRON, RETICCTPCT in the last 72 hours. Urinalysis    Component Value Date/Time   COLORURINE YELLOW 01/26/2017 1858   APPEARANCEUR CLEAR 01/26/2017 1858   LABSPEC 1.035 (H) 01/26/2017 1858   PHURINE 6.0 01/26/2017 1858   GLUCOSEU NEGATIVE 01/26/2017 1858   HGBUR SMALL (A) 01/26/2017 1858   BILIRUBINUR NEGATIVE 01/26/2017 1858   KETONESUR NEGATIVE 01/26/2017 1858   PROTEINUR NEGATIVE 01/26/2017 1858  UROBILINOGEN 0.2 10/20/2008 0842   NITRITE NEGATIVE 01/26/2017 1858   LEUKOCYTESUR TRACE (A) 01/26/2017 1858   Sepsis Labs Invalid input(s): PROCALCITONIN,  WBC,  LACTICIDVEN   Time coordinating discharge: Over 30 minutes  SIGNED:   Renae Fickle, MD  [facility] 01/30/2017, 1:27 PM Pager   If 7PM-7AM, please contact  night-coverage [email or web address] Password [email or web address]

## 2017-01-31 LAB — CULTURE, BLOOD (ROUTINE X 2)
CULTURE: NO GROWTH
Culture: NO GROWTH
SPECIAL REQUESTS: ADEQUATE
Special Requests: ADEQUATE

## 2017-02-11 ENCOUNTER — Ambulatory Visit: Payer: Medicare Other | Admitting: Adult Health

## 2017-02-24 NOTE — Progress Notes (Signed)
Cardiology Office Note   Date:  02/25/2017   ID:  [name], DOB [birth year], MRN [id number]  PCP:  Blair Heys, MD  Cardiologist:  Jens Som Chief Complaint  Patient presents with  . Follow-up    Hospital stay  . Hospitalization Follow-up     History of Present Illness: [name] is a 83 y.o. female who presents for post hospitalization follow up after being seen on consultation by Dr. Jens Som for diagnosis of atrial fibrillation. Other history includes HTN, COPD,carotid stenosis s/p LEA 2010. She was admitted with acute abdominal pain, with NV. She was found to be in atrial fib/ flutter and converted to NSR.  She was started on continued on Atenolol for rate control, and started on Eliquis 5 mg BID for CHADS VASC Score of 5. Echocardiogram was completed during hospitalization. This revealed normal LVEF of 60%-65%, pulmonary hypertension, and grade II diastolic dysfunction. LA was normal in size.   She was diagnosed with SBO vs ileus, and pneumonia during admission. She is here for ongoing assessment of her response to medication.   She is doing well, she denies any bleeding, bruising, or melena on ELIQUIS. She questions whether or not she needs to continue to take it. She has weaned herself off prednisone and no longer takes MiraLAX regularly, only when necessary. He denies any further symptoms of abdominal pain or nausea  Past Medical History:  Diagnosis Date  . Arthritis   . Carotid artery occlusion   . Colitis   . COPD (chronic obstructive pulmonary disease) (HCC)   . Diverticulitis   . Hypertension   . Keratosis, seborrheic     Past Surgical History:  Procedure Laterality Date  . ABDOMINAL HYSTERECTOMY    . BREAST SURGERY     cyst removal  . CAROTID ENDARTERECTOMY Left 2010  . CHOLECYSTECTOMY    . EYE SURGERY Bilateral 06/2014   cataracts  . JOINT REPLACEMENT     RIGHT KNEE  . REPLACEMENT TOTAL KNEE Right      Current Outpatient Medications  Medication Sig  Dispense Refill  . acetaminophen (TYLENOL) 500 MG tablet Take 1,000 mg by mouth every 6 (six) hours as needed for mild pain, moderate pain, fever or headache.    Marland Kitchen amLODipine (NORVASC) 5 MG tablet Take 5 mg by mouth daily with breakfast.     . apixaban (ELIQUIS) 5 MG TABS tablet Take 1 tablet (5 mg total) by mouth every 12 (twelve) hours. 60 tablet 0  . atenolol (TENORMIN) 25 MG tablet Take 25 mg by mouth daily with breakfast.   3  . Coral Calcium (SM CORAL CALCIUM) 1000 (390 Ca) MG TABS Take 1,000 mg by mouth at bedtime.    Marland Kitchen LORazepam (ATIVAN) 1 MG tablet Take 0.5-1 mg by mouth 2 (two) times daily. Takes 0.5mg  right when she gets up for the day and then a little while later she takes 1mg     . losartan (COZAAR) 100 MG tablet Take 100 mg by mouth daily with breakfast.     . omeprazole (PRILOSEC) 40 MG capsule Take 40 mg by mouth daily with breakfast.     . sertraline (ZOLOFT) 100 MG tablet Take 100 mg by mouth at bedtime.     . sodium chloride (OCEAN) 0.65 % SOLN nasal spray Place 1 spray into both nostrils 2 (two) times daily.    . ondansetron (ZOFRAN ODT) 8 MG disintegrating tablet Take 1 tablet (8 mg total) by mouth every 8 (eight) hours as needed for  nausea or vomiting. (Patient not taking: Reported on 09/25/2016) 10 tablet 0  . polyethylene glycol powder (MIRALAX) powder Take 17 g by mouth daily. 255 g 0  . predniSONE (DELTASONE) 10 MG tablet Take 10 mg by mouth daily.   3   No current facility-administered medications for this visit.     Allergies:   Amoxicillin and Tetracyclines & related    Social History:  The patient  reports that she quit smoking about 15 years ago. Her smoking use included cigarettes. She quit after 50.00 years of use. she has never used smokeless tobacco. She reports that she does not drink alcohol or use drugs.   Family History:  The patient's family history includes AAA (abdominal aortic aneurysm) in her brother and sister; Heart attack in her brother; Heart  disease in her mother.    ROS: All other systems are reviewed and negative. Unless otherwise mentioned in H&P    PHYSICAL EXAM: VS:  BP 134/72   Pulse 72   Ht 5\' 5"  (1.651 m)   Wt 161 lb (73 kg)   SpO2 96%   BMI 26.79 kg/m  , BMI Body mass index is 26.79 kg/m. GEN: Well nourished, well developed, in no acute distress  HEENT: normal  Neck: no JVD, carotid bruits, or masses Cardiac: RRR; no murmurs, rubs, or gallops,no edema  Respiratory:  clear to auscultation bilaterally, normal work of breathing GI: soft, nontender, nondistended, + BS MS: no deformity or atrophy  Skin: warm and dry, no rash. Pale Neuro:  Strength and sensation are intact Psych: euthymic mood, full affect    Recent Labs: 01/26/2017: ALT 14 01/27/2017: TSH 0.678 01/30/2017: BUN 8; Creatinine, Ser 0.85; Hemoglobin 11.5; Magnesium 1.7; Platelets 233; Potassium 4.0; Sodium 136    Wt Readings from Last 3 Encounters:  02/25/17 161 lb (73 kg)  01/26/17 159 lb 13.3 oz (72.5 kg)  09/25/16 163 lb (73.9 kg)      Other studies Reviewed: Echocardiogram Left ventricle: The cavity size was normal. There was mild focal   basal hypertrophy of the septum. Systolic function was normal.   The estimated ejection fraction was in the range of 60% to 65%.   Wall motion was normal; there were no regional wall motion   abnormalities. Features are consistent with a pseudonormal left   ventricular filling pattern, with concomitant abnormal relaxation   and increased filling pressure (grade 2 diastolic dysfunction).   Doppler parameters are consistent with high ventricular filling   pressure. - Aortic valve: Trileaflet; mildly thickened, mildly calcified   leaflets. - Mitral valve: There was mild regurgitation. - Tricuspid valve: There was mild-moderate regurgitation. - Pulmonary arteries: PA peak pressure: 40 mm Hg (S).  ASSESSMENT AND PLAN:  1.  Paroxysmal atrial fibrillation: CHADS VASC score of 5. I explained the risks  that she has a, and the need to continue ELIQUIS indefinitely. She verbalizes understanding prescriptions written for her as she was given medication on discharge no refills. She will continue atenolol. She remains in normal sinus rhythm today.  2. Hypertension: Currently well-controlled on losartan and atenolol. No changes in medication regimen.  3. Ileus versus SBO: Resolved during recent hospitalization. She'll be followed by her primary care physician. No further symptoms at this office visit.  4. Carotid artery disease: No bruits are auscultated on exam. Continue follow. Current medicines are reviewed at length with the patient today.    Labs/ tests ordered today include: none Bettey MareKathryn M. Liborio NixonLawrence DNP, ANP, AACC   02/25/2017  9:11 AM    [facility] [address], [place], [place] [address] Phone: [phone]; Fax: [phone]

## 2017-02-25 ENCOUNTER — Encounter: Payer: Self-pay | Admitting: Adult Health

## 2017-02-25 ENCOUNTER — Ambulatory Visit: Payer: Medicare Other | Admitting: Adult Health

## 2017-02-25 VITALS — BP 134/72 | HR 72 | Ht 65.0 in | Wt 161.0 lb

## 2017-02-25 DIAGNOSIS — I6522 Occlusion and stenosis of left carotid artery: Secondary | ICD-10-CM | POA: Diagnosis not present

## 2017-02-25 DIAGNOSIS — I5032 Chronic diastolic (congestive) heart failure: Secondary | ICD-10-CM | POA: Diagnosis not present

## 2017-02-25 DIAGNOSIS — I10 Essential (primary) hypertension: Secondary | ICD-10-CM | POA: Diagnosis not present

## 2017-02-25 DIAGNOSIS — I480 Paroxysmal atrial fibrillation: Secondary | ICD-10-CM | POA: Diagnosis not present

## 2017-02-25 MED ORDER — APIXABAN 5 MG PO TABS: 5.0000 mg | ORAL_TABLET | Freq: Two times a day (BID) | ORAL | 6 refills | Status: DC

## 2017-02-25 NOTE — Patient Instructions (Signed)
Medication Instructions:  NO CHANGES-Your physician recommends that you continue on your current medications as directed. Please refer to the Current Medication list given to you today.  If you need a refill on your cardiac medications before your next appointment, please call your pharmacy.  Follow-Up: Your physician wants you to follow-up in: 6 MONTHS WITH DR Jens SomRENSHAW -OR- Joni ReiningKATHRYN LAWRENCE, DNP.   You should receive a reminder letter in the mail two months in advance. If you do not receive a letter, please call our office MAY 2019 to schedule the July 2019 follow-up appointment.   Thank you for choosing [facility]!!

## 2017-07-05 ENCOUNTER — Ambulatory Visit
Admission: RE | Admit: 2017-07-05 | Discharge: 2017-07-05 | Disposition: A | Discharge status: Home / Self Care | Payer: Medicare Other | Source: Ambulatory Visit | Attending: Family Medicine | Admitting: Family Medicine

## 2017-07-05 ENCOUNTER — Other Ambulatory Visit: Payer: Self-pay | Admitting: Family Medicine

## 2017-07-05 DIAGNOSIS — R059 Cough, unspecified: Secondary | ICD-10-CM

## 2017-07-05 DIAGNOSIS — R05 Cough: Secondary | ICD-10-CM

## 2017-07-30 ENCOUNTER — Ambulatory Visit (INDEPENDENT_AMBULATORY_CARE_PROVIDER_SITE_OTHER): Payer: Self-pay | Admitting: Orthopaedic Surgery

## 2017-08-07 ENCOUNTER — Ambulatory Visit (INDEPENDENT_AMBULATORY_CARE_PROVIDER_SITE_OTHER): Payer: Self-pay

## 2017-08-07 ENCOUNTER — Ambulatory Visit (INDEPENDENT_AMBULATORY_CARE_PROVIDER_SITE_OTHER): Payer: Medicare Other | Admitting: Orthopaedic Surgery

## 2017-08-07 ENCOUNTER — Encounter (INDEPENDENT_AMBULATORY_CARE_PROVIDER_SITE_OTHER): Payer: Self-pay | Admitting: Orthopaedic Surgery

## 2017-08-07 DIAGNOSIS — M542 Cervicalgia: Secondary | ICD-10-CM | POA: Diagnosis not present

## 2017-08-07 DIAGNOSIS — G8929 Other chronic pain: Secondary | ICD-10-CM | POA: Diagnosis not present

## 2017-08-07 DIAGNOSIS — M25561 Pain in right knee: Secondary | ICD-10-CM | POA: Diagnosis not present

## 2017-08-07 NOTE — Progress Notes (Signed)
Office Visit Note   Patient: [name]           Date of Birth: 03-30-1934           MRN: [id number] Visit Date: 08/07/2017              Requested by: Blair Heys, MD [address] [place], [place] [address] PCP: Blair Heys, MD   Assessment & Plan: Visit Diagnoses:  1. Chronic pain of right knee   2. Neck pain     Plan: Impression is cervical spondylosis and right knee pain status post total knee replacement.  Knee replacement appears stable.  I think she is complaining of some typical soreness after knee replacement.  In terms of her neck she has significant degenerative changes but no evidence of radiculopathy or myelopathy.  Recommend continue taking Tylenol as needed.  Questions encouraged and answered.Recommend over-the-counter medicines and creams.  Follow-Up Instructions: Return if symptoms worsen or fail to improve.   Orders:  Orders Placed This Encounter  Procedures  . XR KNEE 3 VIEW RIGHT  . XR Cervical Spine 2 or 3 views   No orders of the defined types were placed in this encounter.     Procedures: No procedures performed   Clinical Data: No additional findings.   Subjective: Chief Complaint  Patient presents with  . Neck - Pain  . Left Knee - Pain     Patient is a 83 year old female comes in with chronic neck pain and mild right knee pain and cracking.  She states that her right knee is sore all the time.  She underwent right total knee replacement about 6 to 7 years ago.  She states that she is not totally satisfied but she did have improvement in her pain from the knee replacement.  In terms of her neck she denies any radicular symptoms her pain is moderate at worst.  She takes Tylenol occasionally.  Pain is worse with head.  Denies any signs or symptoms of myelopathy.   Review of Systems  Constitutional: Negative.   HENT: Negative.   Eyes: Negative.   Respiratory: Negative.   Cardiovascular: Negative.   Endocrine:  Negative.   Musculoskeletal: Negative.   Neurological: Negative.   Hematological: Negative.   Psychiatric/Behavioral: Negative.   All other systems reviewed and are negative.    Objective: Vital Signs: There were no vitals taken for this visit.  Physical Exam  Constitutional: She is oriented to person, place, and time. She appears well-developed and well-nourished.  HENT:  Head: Normocephalic and atraumatic.  Eyes: EOM are normal.  Neck: Neck supple.  Pulmonary/Chest: Effort normal.  Abdominal: Soft.  Neurological: She is alert and oriented to person, place, and time.  Skin: Skin is warm. Capillary refill takes less than 2 seconds.  Psychiatric: She has a normal mood and affect. Her behavior is normal. Judgment and thought content normal.  Nursing note and vitals reviewed.   Ortho Exam Right knee exam shows a fully healed surgical scar.  Collaterals are stable.  Normal range of motion.  Patella tracking is normal.  Cervical spine exam no focal motor or sensory deficits.  Negative or meets.  Negative Spurling's. Specialty Comments:  No specialty comments available.  Imaging: Xr Cervical Spine 2 Or 3 Views  Result Date: 08/07/2017 Multilevel degenerative disc disease and cervical spondylosis.  Xr Knee 3 View Right  Result Date: 08/07/2017 Stable right total knee replacement without evidence of complications.    PMFS History:  Patient Active Problem List   Diagnosis Date Noted  . SBO (small bowel obstruction) (HCC) 01/26/2017  . Encounter for postoperative carotid endarterectomy surveillance 09/21/2015  . Occlusion and stenosis of carotid artery without mention of cerebral infarction 02/05/2012   Past Medical History:  Diagnosis Date  . Arthritis   . Carotid artery occlusion   . Colitis   . COPD (chronic obstructive pulmonary disease) (HCC)   . Diverticulitis   . Hypertension   . Keratosis, seborrheic     Family History  Problem Relation Age of Onset  .  Heart disease Mother        After age 81  . AAA (abdominal aortic aneurysm) Sister   . AAA (abdominal aortic aneurysm) Brother   . Heart attack Brother   . Breast cancer Neg Hx     Past Surgical History:  Procedure Laterality Date  . ABDOMINAL HYSTERECTOMY    . BREAST SURGERY     cyst removal  . CAROTID ENDARTERECTOMY Left 2010  . CHOLECYSTECTOMY    . EYE SURGERY Bilateral 06/2014   cataracts  . JOINT REPLACEMENT     RIGHT KNEE  . REPLACEMENT TOTAL KNEE Right    Social History   Occupational History  . Not on file  Tobacco Use  . Smoking status: Former Smoker    Years: 50.00    Types: Cigarettes    Last attempt to quit: 02/04/2002    Years since quitting: 15.5  . Smokeless tobacco: Never Used  Substance and Sexual Activity  . Alcohol use: No  . Drug use: No  . Sexual activity: Not on file

## 2017-09-02 NOTE — Progress Notes (Signed)
Cardiology Office Note   Date:  09/03/2017   ID:  Lori PontoCarol S Wilmeth, DOB 1934/11/18, MRN [id number]  PCP:  Blair HeysEhinger, Robert, MD  [facility]  [facility]  Chief Complaint  Patient presents with  . Hypertension  . Atrial Fibrillation     History of Present Illness: [name] is a 83 y.o. female who presents for ongoing assessment and management of atrial fib, HTN, carotid stenosis, s/p LEA 2010. She is on atenolol for rate control, on Eliquis BID CHADS VASC Score 5. She was last seen on 02/25/2017.   She is doing very well and is without cardiac complaints. She has not complaints of chest pain, racing HR, or palpitations. She remains on Eliquis without bleeding issues. She is now on prednisone for chronic musculoskeletal pain, which has helped her tremendously. She has gotten a little more active, She uses a cane for ambulation. She states she is still having some balance issues. She has not fallen. Labs were recently drawn by PCP in late June   Past Medical History:  Diagnosis Date  . Arthritis   . Carotid artery occlusion   . Colitis   . COPD (chronic obstructive pulmonary disease) (HCC)   . Diverticulitis   . Hypertension   . Keratosis, seborrheic     Past Surgical History:  Procedure Laterality Date  . ABDOMINAL HYSTERECTOMY    . BREAST SURGERY     cyst removal  . CAROTID ENDARTERECTOMY Left 2010  . CHOLECYSTECTOMY    . EYE SURGERY Bilateral 06/2014   cataracts  . JOINT REPLACEMENT     RIGHT KNEE  . REPLACEMENT TOTAL KNEE Right      Current Outpatient Medications  Medication Sig Dispense Refill  . acetaminophen (TYLENOL) 500 MG tablet Take 1,000 mg by mouth every 6 (six) hours as needed for mild pain, moderate pain, fever or headache.    Marland Kitchen. amLODipine (NORVASC) 5 MG tablet Take 5 mg by mouth daily with breakfast.     . apixaban (ELIQUIS) 5 MG TABS tablet Take 1 tablet (5 mg total) by mouth every 12 (twelve) hours. 60 tablet 6  . atenolol (TENORMIN) 25 MG tablet Take 25 mg  by mouth daily with breakfast.   3  . Coral Calcium (SM CORAL CALCIUM) 1000 (390 Ca) MG TABS Take 1,000 mg by mouth at bedtime.    Marland Kitchen. LORazepam (ATIVAN) 1 MG tablet Take 0.5-1 mg by mouth 2 (two) times daily. Takes 0.5mg  right when she gets up for the day and then a little while later she takes 1mg     . losartan (COZAAR) 100 MG tablet Take 100 mg by mouth daily with breakfast.     . omeprazole (PRILOSEC) 40 MG capsule Take 40 mg by mouth daily with breakfast.     . oxybutynin (DITROPAN) 5 MG tablet Take 1 tablet by mouth every morning.    . predniSONE (DELTASONE) 10 MG tablet Take 40 mg by mouth daily.  3  . sertraline (ZOLOFT) 100 MG tablet Take 100 mg by mouth at bedtime.     . sodium chloride (OCEAN) 0.65 % SOLN nasal spray Place 1 spray into both nostrils 2 (two) times daily.     No current facility-administered medications for this visit.     Allergies:   Amoxicillin and Tetracyclines & related    Social History:  The patient  reports that she quit smoking about 15 years ago. Her smoking use included cigarettes. She quit after 50.00 years of use. She has never used  smokeless tobacco. She reports that she does not drink alcohol or use drugs.   Family History:  The patient's family history includes AAA (abdominal aortic aneurysm) in her brother and sister; Heart attack in her brother; Heart disease in her mother.    ROS: All other systems are reviewed and negative. Unless otherwise mentioned in H&P    PHYSICAL EXAM: VS:  BP 140/78   Pulse 65   Ht 5\' 6"  (1.676 m)   Wt 170 lb 6.4 oz (77.3 kg)   BMI 27.50 kg/m  , BMI Body mass index is 27.5 kg/m. GEN: Well nourished, well developed, in no acute distress  HEENT: normal  Neck: no JVD, carotid bruits, or masses Cardiac: RRR; no murmurs, rubs, or gallops,no edema  Respiratory:  Clear to auscultation bilaterally, normal work of breathing GI: soft, nontender, nondistended, + BS MS: no deformity or atrophy  Skin: warm and dry, no  rash Neuro:  Strength and sensation are intact Psych: euthymic mood, full affect   EKG:  NSR rate of 65 bpm.   Recent Labs: 01/26/2017: ALT 14 01/27/2017: TSH 0.678 01/30/2017: BUN 8; Creatinine, Ser 0.85; Hemoglobin 11.5; Magnesium 1.7; Platelets 233; Potassium 4.0; Sodium 136    Lipid Panel No results found for: CHOL, TRIG, HDL, CHOLHDL, VLDL, LDLCALC, LDLDIRECT    Wt Readings from Last 3 Encounters:  09/03/17 170 lb 6.4 oz (77.3 kg)  02/25/17 161 lb (73 kg)  01/26/17 159 lb 13.3 oz (72.5 kg)      Other studies Reviewed: Echo 01/28/2017  Left ventricle: The cavity size was normal. There was mild focal   basal hypertrophy of the septum. Systolic function was normal.   The estimated ejection fraction was in the range of 60% to 65%.   Wall motion was normal; there were no regional wall motion   abnormalities. Features are consistent with a pseudonormal left   ventricular filling pattern, with concomitant abnormal relaxation   and increased filling pressure (grade 2 diastolic dysfunction).   Doppler parameters are consistent with high ventricular filling   pressure. - Aortic valve: Trileaflet; mildly thickened, mildly calcified   leaflets. - Mitral valve: There was mild regurgitation. - Tricuspid valve: There was mild-moderate regurgitation. - Pulmonary arteries: PA peak pressure: 40 mm Hg (S).  Impressions:  - The right ventricular systolic pressure was increased consistent   with mild pulmonary hypertension.cardiogram     ASSESSMENT AND PLAN:  1.Atrial fib: Remains in NSR rate of 65 bpm She continues on Eliquis and atenolol.     She is doing well. No changes.  2. Hypertension: BP is essentially normal Slightly elevated. Will monitor she is on losartan and amlodipine, and is compliant with this.  3. Chronic Knee and cervical spine pain: Followed by PCP now on prednisone.   4. Grade II Diastolic Dysfunction: No evidence of fluid overload. Keep BP controlled.    Current medicines are reviewed at length with the patient today.    Labs/ tests ordered today include: None Bettey MareKathryn M. Liborio NixonLawrence DNP, ANP, AACC   09/03/2017 1:29 PM    [facility] [address], [place], [place] [address] Phone: [phone]; Fax: [phone]

## 2017-09-03 ENCOUNTER — Encounter: Payer: Self-pay | Admitting: Adult Health

## 2017-09-03 ENCOUNTER — Ambulatory Visit: Payer: Medicare Other | Admitting: Adult Health

## 2017-09-03 VITALS — BP 140/78 | HR 65 | Ht 66.0 in | Wt 170.4 lb

## 2017-09-03 DIAGNOSIS — I10 Essential (primary) hypertension: Secondary | ICD-10-CM

## 2017-09-03 DIAGNOSIS — I4891 Unspecified atrial fibrillation: Secondary | ICD-10-CM

## 2017-09-03 NOTE — Patient Instructions (Signed)
Medication Instructions:  NO CHANGES- Your physician recommends that you continue on your current medications as directed. Please refer to the Current Medication list given to you today.  If you need a refill on your cardiac medications before your next appointment, please call your pharmacy.  Follow-Up: Your physician wants you to follow-up in: 6 MONTHS WITH DR Jens SomRENSHAW You should receive a reminder letter in the mail two months in advance. If you do not receive a letter, please call our office to schedule the follow-up appointment.   [facility] you [facility] choosing [facility]

## 2017-09-12 ENCOUNTER — Other Ambulatory Visit: Payer: Self-pay | Admitting: Adult Health

## 2017-09-25 ENCOUNTER — Telehealth: Payer: Self-pay | Admitting: Adult Health

## 2017-09-25 NOTE — Telephone Encounter (Signed)
LM for patient that provider portion of patient assistance application has been completed and will left for her to pick up at the front desk.

## 2017-09-27 ENCOUNTER — Encounter (HOSPITAL_COMMUNITY): Payer: Medicare Other

## 2017-09-27 ENCOUNTER — Ambulatory Visit: Payer: Medicare Other | Admitting: Family

## 2017-10-15 ENCOUNTER — Other Ambulatory Visit: Payer: Self-pay | Admitting: Family Medicine

## 2017-10-15 DIAGNOSIS — R05 Cough: Secondary | ICD-10-CM

## 2017-10-15 DIAGNOSIS — R9389 Abnormal findings on diagnostic imaging of other specified body structures: Secondary | ICD-10-CM

## 2017-10-15 DIAGNOSIS — R062 Wheezing: Secondary | ICD-10-CM

## 2017-10-15 DIAGNOSIS — R059 Cough, unspecified: Secondary | ICD-10-CM

## 2017-10-21 ENCOUNTER — Ambulatory Visit
Admission: RE | Admit: 2017-10-21 | Discharge: 2017-10-21 | Disposition: A | Discharge status: Home / Self Care | Payer: Medicare Other | Source: Ambulatory Visit | Attending: Family Medicine | Admitting: Family Medicine

## 2017-10-21 DIAGNOSIS — R059 Cough, unspecified: Secondary | ICD-10-CM

## 2017-10-21 DIAGNOSIS — R062 Wheezing: Secondary | ICD-10-CM

## 2017-10-21 DIAGNOSIS — R05 Cough: Secondary | ICD-10-CM

## 2017-10-21 DIAGNOSIS — R9389 Abnormal findings on diagnostic imaging of other specified body structures: Secondary | ICD-10-CM

## 2017-10-21 MED ORDER — IOPAMIDOL (ISOVUE-300) INJECTION 61%
75.0000 mL | Freq: Once | INTRAVENOUS | Status: AC | PRN
  Administered 2017-10-21: 75 mL via INTRAVENOUS

## 2017-10-30 ENCOUNTER — Other Ambulatory Visit: Payer: Self-pay | Admitting: Family Medicine

## 2017-10-30 DIAGNOSIS — Z1231 Encounter for screening mammogram for malignant neoplasm of breast: Secondary | ICD-10-CM

## 2017-11-11 ENCOUNTER — Encounter: Payer: Self-pay | Admitting: Family

## 2017-11-11 ENCOUNTER — Ambulatory Visit (HOSPITAL_COMMUNITY)
Admission: RE | Admit: 2017-11-11 | Discharge: 2017-11-11 | Disposition: A | Discharge status: Home / Self Care | Payer: Medicare Other | Source: Ambulatory Visit | Attending: Vascular Surgery | Admitting: Vascular Surgery

## 2017-11-11 ENCOUNTER — Ambulatory Visit: Payer: Medicare Other | Admitting: Family

## 2017-11-11 VITALS — BP 181/78 | HR 60 | Temp 97.2°F | Resp 16 | Ht 65.0 in | Wt 178.0 lb

## 2017-11-11 DIAGNOSIS — I6522 Occlusion and stenosis of left carotid artery: Secondary | ICD-10-CM | POA: Diagnosis not present

## 2017-11-11 DIAGNOSIS — I6523 Occlusion and stenosis of bilateral carotid arteries: Secondary | ICD-10-CM

## 2017-11-11 DIAGNOSIS — Z9889 Other specified postprocedural states: Secondary | ICD-10-CM

## 2017-11-11 DIAGNOSIS — Z87891 Personal history of nicotine dependence: Secondary | ICD-10-CM | POA: Diagnosis not present

## 2017-11-11 NOTE — Patient Instructions (Signed)

## 2017-11-11 NOTE — Progress Notes (Signed)
Chief Complaint: Follow up Extracranial Carotid Artery Stenosis   History of Present Illness  [name] is a 83 y.o. female who is s/p left CEA in 2010 by Dr. Hart Rochester.    She denies any known history of stroke or TIA. Specifically she denies a history of amaurosis fugax or monocular blindness, unilateral facial drooping, hemiplegia, or receptive or expressive aphasia.    She denies chest pain or headache, states no more dyspnea than usual. She denies claudication type symptoms with walking.  She states her blood pressure at home runs 140's/70's.   She uses a cane when ambulating, states she has balance issues, unknown reason.   Diabetic: no Tobacco use: former smoker, quit in 2004, smoked x 50 years  Pt meds include: Statin : no ASA: no Other anticoagulants/antiplatelets: Eliquis, has atrial fib   Past Medical History:  Diagnosis Date  . Arthritis   . Atrial fibrillation (HCC)   . Carotid artery occlusion   . Colitis   . COPD (chronic obstructive pulmonary disease) (HCC)   . Diverticulitis   . Hypertension   . Keratosis, seborrheic     Social History Social History   Tobacco Use  . Smoking status: Former Smoker    Years: 50.00    Types: Cigarettes    Last attempt to quit: 02/04/2002    Years since quitting: 15.7  . Smokeless tobacco: Never Used  Substance Use Topics  . Alcohol use: No  . Drug use: No    Family History Family History  Problem Relation Age of Onset  . Heart disease Mother        After age 33  . AAA (abdominal aortic aneurysm) Sister   . AAA (abdominal aortic aneurysm) Brother   . Heart attack Brother   . Breast cancer Neg Hx     Surgical History Past Surgical History:  Procedure Laterality Date  . ABDOMINAL HYSTERECTOMY    . BREAST SURGERY     cyst removal  . CAROTID ENDARTERECTOMY Left 2010  . CHOLECYSTECTOMY    . EYE SURGERY Bilateral 06/2014   cataracts  . JOINT REPLACEMENT     RIGHT KNEE  . REPLACEMENT TOTAL KNEE Right      Allergies  Allergen Reactions  . Amoxicillin Nausea Only and Rash    Has patient had a PCN reaction causing immediate rash, facial/tongue/throat swelling, SOB or lightheadedness with hypotension: Yes Has patient had a PCN reaction causing severe rash involving mucus membranes or skin necrosis: No Has patient had a PCN reaction that required hospitalization No Has patient had a PCN reaction occurring within the last 10 years: No If all of the above answers are "NO", then may proceed with Cephalosporin use.   . Tetracyclines & Related Nausea Only and Rash    Current Outpatient Medications  Medication Sig Dispense Refill  . acetaminophen (TYLENOL) 500 MG tablet Take 1,000 mg by mouth every 6 (six) hours as needed for mild pain, moderate pain, fever or headache.    Marland Kitchen amLODipine (NORVASC) 5 MG tablet Take 5 mg by mouth daily with breakfast.     . apixaban (ELIQUIS) 5 MG TABS tablet Take 1 tablet (5 mg total) by mouth 2 (two) times daily. 60 tablet 5  . atenolol (TENORMIN) 25 MG tablet Take 25 mg by mouth daily with breakfast.   3  . Coral Calcium (SM CORAL CALCIUM) 1000 (390 Ca) MG TABS Take 1,000 mg by mouth at bedtime.    Marland Kitchen LORazepam (ATIVAN) 1 MG  tablet Take 0.5-1 mg by mouth 2 (two) times daily. Takes 0.5mg  right when she gets up for the day and then a little while later she takes 1mg     . losartan (COZAAR) 100 MG tablet Take 100 mg by mouth daily with breakfast.     . omeprazole (PRILOSEC) 40 MG capsule Take 40 mg by mouth daily with breakfast.     . oxybutynin (DITROPAN) 5 MG tablet Take 1 tablet by mouth every morning.    . predniSONE (DELTASONE) 10 MG tablet Take 5 mg by mouth daily.   3  . sertraline (ZOLOFT) 100 MG tablet Take 100 mg by mouth at bedtime.     . sodium chloride (OCEAN) 0.65 % SOLN nasal spray Place 1 spray into both nostrils 2 (two) times daily.     No current facility-administered medications for this visit.     Review of Systems : See HPI for pertinent  positives and negatives.  Physical Examination  Vitals:   11/11/17 1540 11/11/17 1543  BP: (!) 176/81 (!) 181/78  Pulse: 61 60  Resp: 16   Temp: (!) 97.2 F (36.2 C)   TempSrc: Oral   SpO2: 96% 96%  Weight: 178 lb (80.7 kg)   Height: 5\' 5"  (1.651 m)    Body mass index is 29.62 kg/m.  General: WDWN female in NAD GAIT: steady, using a cane Eyes: PERRLA HENT: No gross abnormalities.  Pulmonary:  Respirations are non-labored, good air movement in all fields, CTAB,   Rales,  rhonchi, or wheezes.  Cardiac: regular rhythm, no detected murmur.  VASCULAR EXAM Carotid Bruits Right Left   Positive Positive     Abdominal aortic pulse is not palpable. Radial pulses are 2+ palpable and equal.                                                                                                                            LE Pulses Right Left       POPLITEAL  not palpable   not palpable       POSTERIOR TIBIAL  not palpable   not palpable        DORSALIS PEDIS      ANTERIOR TIBIAL not palpable  not palpable     Gastrointestinal: soft, nontender, BS WNL, no r/g, no palpable masses. Musculoskeletal: no muscle atrophy/wasting. M/S 5/5 in upper extremities, 4/5 in lower extremities, extremities without ischemic changes. Skin: No rashes, no ulcers, no cellulitis.   Neurologic:  A&O X 3; appropriate affect, sensation is normal; speech is normal, CN 2-12 intact except has some hearing loss, pain and light touch intact in extremities, motor exam as listed above. Psychiatric: Normal thought content, mood appropriate to clinical situation.    Assessment: [name] is a 83 y.o. female who is s/p left CEA in 2010.  She has no history of stroke or TIA.   She is not taking a statin, states she has never taken one.  She takes Eliquis for atrial fib.  She quit smoking in 2004, smoked x 50+ years.  She does not have DM   DATA Carotid Duplex (11-11-17): Right ICA: 1-39% stenosis Left ICA:  CEA site wit no significant stenosis, distal CCA with >50% stenosis.  Left ECA stenosis.  Bilateral vertebral artery flow is antegrade.  Bilateral subclavian artery waveforms are normal.  No significant change compared to the exam on 09-25-16.    Plan: Follow-up in 1 year with Carotid Duplex scan.   I discussed in depth with the patient the nature of atherosclerosis, and emphasized the importance of maximal medical management including strict control of blood pressure, blood glucose, and lipid levels, obtaining regular exercise, and continued cessation of smoking.  The patient is aware that without maximal medical management the underlying atherosclerotic disease process will progress, limiting the benefit of any interventions. The patient was given information about stroke prevention and what symptoms should prompt the patient to seek immediate medical care. Thank you for allowing [place] to participate in this patient's care.  Charisse March, RN, MSN, FNP-C [facility] Office: [phone]  Clinic Physician: Myra Gianotti  11/11/17 3:47 PM

## 2017-12-03 ENCOUNTER — Ambulatory Visit
Admission: RE | Admit: 2017-12-03 | Discharge: 2017-12-03 | Disposition: A | Discharge status: Home / Self Care | Payer: Medicare Other | Source: Ambulatory Visit | Attending: Family Medicine | Admitting: Family Medicine

## 2017-12-03 DIAGNOSIS — Z1231 Encounter for screening mammogram for malignant neoplasm of breast: Secondary | ICD-10-CM

## 2017-12-30 ENCOUNTER — Telehealth: Payer: Self-pay | Admitting: Adult Health

## 2017-12-30 NOTE — Telephone Encounter (Signed)
[name] - the med arrived Monday - I'll bring it to you Tues AM

## 2017-12-30 NOTE — Telephone Encounter (Signed)
New message   Pt c/o medication issue:  1. Name of Medication: apixaban (ELIQUIS) 5 MG TABS tablet         2. How are you currently taking this medication (dosage and times per day)?twice daily  3. Are you having a reaction (difficulty breathing--STAT)?n/a  4. What is your medication issue? Patient states that she gets patient assistance and states that this medication should come to the office anytime. Please call the patient when this medication arrives.

## 2017-12-31 NOTE — Telephone Encounter (Signed)
PT NOTIFIED SHE STATES THAT SHE DOES NOT KNOW WHEN SHE CAN COME AND GET IT. "MAYBE Thursday", SHE FURTHER STATES THAT SHE HAS SOME PAPERWORK FOR "HER" TO FILL OUT. WILL AWAIT PT TO BRING IN TO REVIEW. [facility]

## 2018-01-27 ENCOUNTER — Telehealth: Payer: Self-pay

## 2018-01-27 NOTE — Telephone Encounter (Signed)
PT NOTIFIED BY OPERATOR

## 2018-01-27 NOTE — Telephone Encounter (Signed)
SPOKE WITH NIECE AND SHE WILL HAVE PT COME AND PICK UP FORMS AND FILL OUT BRAND BRING BACK ALONG WITH PROOF OF INCOME TO FAX BACK FOR ELIQUIS PT ASSISTANCE.

## 2018-02-03 ENCOUNTER — Other Ambulatory Visit: Payer: Self-pay

## 2018-02-03 MED ORDER — APIXABAN 5 MG PO TABS: 5.0000 mg | ORAL_TABLET | Freq: Two times a day (BID) | ORAL | 2 refills | Status: DC

## 2018-02-05 NOTE — Telephone Encounter (Signed)
RETURNED CALL TO PT FOR FOLLOW UP ON PAPERWORK . PT STATES THAT SHE IS NOT DONE "FIGURING IT OUT AND ALSO FILLING IT OUT. SHE STATES THAT SHE WILL RETURN IT BEFORE THE END OF THE WEEK.

## 2018-02-10 NOTE — Telephone Encounter (Signed)
FAXED TO [facility] 02-06-2018

## 2018-02-19 ENCOUNTER — Ambulatory Visit (INDEPENDENT_AMBULATORY_CARE_PROVIDER_SITE_OTHER): Payer: Medicare Other

## 2018-02-19 ENCOUNTER — Ambulatory Visit (INDEPENDENT_AMBULATORY_CARE_PROVIDER_SITE_OTHER): Payer: Medicare Other | Admitting: Orthopaedic Surgery

## 2018-02-19 DIAGNOSIS — M542 Cervicalgia: Secondary | ICD-10-CM

## 2018-02-19 MED ORDER — PREDNISONE 10 MG (21) PO TBPK: ORAL_TABLET | ORAL | 0 refills | Status: DC

## 2018-02-19 NOTE — Progress Notes (Signed)
Office Visit Note   Patient: [name]           Date of Birth: [birth year]           MRN: [id number] Visit Date: 02/19/2018              Requested by: Blair HeysEhinger, Robert, MD [address] [place], [place] [address] PCP: Blair HeysEhinger, Robert, MD   Assessment & Plan: Visit Diagnoses:  1. Neck pain     Plan: Impression is cervical spondylosis and degenerative disc disease.  We will try a prednisone Dosepak to see if this will help with her pain.  She is on Eliquis for her atrial fibrillation so she cannot take NSAIDs.  Patient will call us if she does not improve.  Follow-Up Instructions: Return if symptoms worsen or fail to improve.   Orders:  Orders Placed This Encounter  Procedures  . XR Cervical Spine 2 or 3 views   Meds ordered this encounter  Medications  . predniSONE (STERAPRED UNI-PAK 21 TAB) 10 MG (21) TBPK tablet    Sig: Take as directed    Dispense:  21 tablet    Refill:  0      Procedures: No procedures performed   Clinical Data: No additional findings.   Subjective: Chief Complaint  Patient presents with  . Neck - Pain  . Right Shoulder - Pain    [name] is a 84 year old female comes in with left-sided neck pain.  Occasionally radiates to the left shoulder.  This started a few weeks ago.  She has tried dry needling which has not helped.  She states that hurts to turn her head.  She takes Tylenol with partial relief.  She takes a low-dose prednisone on a daily basis.   Review of Systems  Constitutional: Negative.   HENT: Negative.   Eyes: Negative.   Respiratory: Negative.   Cardiovascular: Negative.   Endocrine: Negative.   Musculoskeletal: Negative.   Neurological: Negative.   Hematological: Negative.   Psychiatric/Behavioral: Negative.   All other systems reviewed and are negative.    Objective: Vital Signs: There were no vitals taken for this visit.  Physical Exam Vitals signs and nursing note reviewed.  Constitutional:    Appearance: She is well-developed.  Pulmonary:     Effort: Pulmonary effort is normal.  Skin:    General: Skin is warm.     Capillary Refill: Capillary refill takes less than 2 seconds.  Neurological:     Mental Status: She is alert and oriented to person, place, and time.  Psychiatric:        Behavior: Behavior normal.        Thought Content: Thought content normal.        Judgment: Judgment normal.     Ortho Exam Cervical spine exam shows limited range of motion.  Negative Spurling's.  I can reproduce the left-sided neck pain with twisting of her head. Specialty Comments:  No specialty comments available.  Imaging: Xr Cervical Spine 2 Or 3 Views  Result Date: 02/19/2018 Significant multilevel degenerative disc disease and spondylosis.    PMFS History: Patient Active Problem List   Diagnosis Date Noted  . SBO (small bowel obstruction) (HCC) 01/26/2017  . Encounter for postoperative carotid endarterectomy surveillance 09/21/2015  . Occlusion and stenosis of carotid artery without mention of cerebral infarction 02/05/2012   Past Medical History:  Diagnosis Date  . Arthritis   . Atrial fibrillation (HCC)   . Carotid artery occlusion   .  Colitis   . COPD (chronic obstructive pulmonary disease) (HCC)   . Diverticulitis   . Hypertension   . Keratosis, seborrheic     Family History  Problem Relation Age of Onset  . Heart disease Mother        After age 47  . AAA (abdominal aortic aneurysm) Sister   . AAA (abdominal aortic aneurysm) Brother   . Heart attack Brother   . Breast cancer Cousin   . Breast cancer Other   . Breast cancer Other     Past Surgical History:  Procedure Laterality Date  . ABDOMINAL HYSTERECTOMY    . BREAST EXCISIONAL BIOPSY Left   . BREAST EXCISIONAL BIOPSY Left   . BREAST EXCISIONAL BIOPSY Right   . BREAST SURGERY     cyst removal  . CAROTID ENDARTERECTOMY Left 2010  . CHOLECYSTECTOMY    . EYE SURGERY Bilateral 06/2014   cataracts  .  JOINT REPLACEMENT     RIGHT KNEE  . REPLACEMENT TOTAL KNEE Right    Social History   Occupational History  . Not on file  Tobacco Use  . Smoking status: Former Smoker    Years: 50.00    Types: Cigarettes    Last attempt to quit: 02/04/2002    Years since quitting: 16.0  . Smokeless tobacco: Never Used  Substance and Sexual Activity  . Alcohol use: No  . Drug use: No  . Sexual activity: Not on file

## 2018-02-20 ENCOUNTER — Telehealth: Payer: Self-pay

## 2018-02-20 NOTE — Telephone Encounter (Signed)
Discussed this with pt she states that she is unable to afford anything, but will come in to have this explained to her the pro's vs-cons with the pharmacist. Will send message to have scheduling call her to set up a new pt appt for coumadin.

## 2018-02-20 NOTE — Telephone Encounter (Signed)
Received faxed letter from [employer] pt assistance program, it states that her request for help was denied. Called to talk to pt and she states that she will just stop and go back to ASA. She states that her co-pay is $48/mo and she cannot afford this. I informed her that that is not an option and I will call her back a with KL's instruction for replacement.

## 2018-02-20 NOTE — Telephone Encounter (Signed)
With atrial fib she will require anticoagulation. Eliquis is the better choice over ASA. Coumadin is also a choice, less expensive but will require labs. I do not recommend that she stop Eliquis and only use ASA. If she does so, she increase the risk of stroke. She will have to accept this risk should she choose not to take Eliquis.

## 2018-03-03 ENCOUNTER — Ambulatory Visit (INDEPENDENT_AMBULATORY_CARE_PROVIDER_SITE_OTHER): Payer: Medicare Other | Admitting: Pharmacist

## 2018-03-03 DIAGNOSIS — I4891 Unspecified atrial fibrillation: Secondary | ICD-10-CM

## 2018-03-03 DIAGNOSIS — Z7901 Long term (current) use of anticoagulants: Secondary | ICD-10-CM

## 2018-03-03 DIAGNOSIS — I480 Paroxysmal atrial fibrillation: Secondary | ICD-10-CM | POA: Insufficient documentation

## 2018-03-03 NOTE — Patient Instructions (Signed)
Transition to Eliquis  March/2 - Take Elquis 5mg  twice daily plus warfarin 4mg  in evening  March/3 - Take Eliquis 5mg  twice daily plus warfarin 4mg  in evening  March/4 - Take Eliquis 5mg  twice daily plus warfarin 4mg  in evening  March/5 - NO more eliquis, continue warfarin 4mg  daily in evening  March/6 - continue warfarin 4mg  daily in evening  March/7 - continue warfarin 4mg  daily in evening  March/8 - continue warfarin 4mg  daily in evening  March/9 - appointment with Coumadin Clinic at    Vitamin K Foods and Warfarin Warfarin is a blood thinner (anticoagulant). Anticoagulant medicines help prevent the formation of blood clots. These medicines work by decreasing the activity of vitamin K, which promotes normal blood clotting. When you take warfarin, problems can occur from suddenly increasing or decreasing the amount of vitamin K that you eat from one day to the next. Problems may include:  Blood clots.  Bleeding. What general guidelines do I need to follow? To avoid problems when taking warfarin:  Eat a balanced diet that includes: ? Fresh fruits and vegetables. ? Whole grains. ? Low-fat dairy products. ? Lean proteins, such as fish, eggs, and lean cuts of meat.  Keep your intake of vitamin K consistent from day to day. To do this: ? Avoid eating large amounts of vitamin K one day and low amounts of vitamin K the next day. ? If you take a multivitamin that contains vitamin K, be sure to take it every day. ? Know which foods contain vitamin K. Use the lists below to understand serving sizes and the amount of vitamin K in one serving.  Avoid major changes in your diet. If you are going to change your diet, talk with your health care provider before making changes.  Work with a Dealer (dietitian) to develop a meal plan that works best for you.  High vitamin K foods Foods that are high in vitamin K contain more than 100 mcg (micrograms) per serving. These  include:  Broccoli (cooked) -  cup has 110 mcg.  Brussels sprouts (cooked) -  cup has 109 mcg.  Greens, beet (cooked) -  cup has 350 mcg.  Greens, collard (cooked) -  cup has 418 mcg.  Greens, turnip (cooked) -  cup has 265 mcg.  Green onions or scallions -  cup has 105 mcg.  Kale (fresh or frozen) -  cup has 531 mcg.  Parsley (raw) - 10 sprigs has 164 mcg.  Spinach (cooked) -  cup has 444 mcg.  Swiss chard (cooked) -  cup has 287 mcg. Moderate vitamin K foods Foods that have a moderate amount of vitamin K contain 25-100 mcg per serving. These include:  Asparagus (cooked) - 5 spears have 38 mcg.  Black-eyed peas (dried) -  cup has 32 mcg.  Cabbage (cooked) -  cup has 37 mcg.  Kiwi fruit - 1 medium has 31 mcg.  Lettuce - 1 cup has 57-63 mcg.  Okra (frozen) -  cup has 44 mcg.  Prunes (dried) - 5 prunes have 25 mcg.  Watercress (raw) - 1 cup has 85 mcg. Low vitamin K foods Foods low in vitamin K contain less than 25 mcg per serving. These include:  Artichoke - 1 medium has 18 mcg.  Avocado - 1 oz. has 6 mcg.  Blueberries -  cup has 14 mcg.  Cabbage (raw) -  cup has 21 mcg.  Carrots (cooked) -  cup has 11 mcg.  Cauliflower (raw) -  cup has 11 mcg.  Cucumber with peel (raw) -  cup has 9 mcg.  Grapes -  cup has 12 mcg.  Mango - 1 medium has 9 mcg.  Nuts - 1 oz. has 15 mcg.  Pear - 1 medium has 8 mcg.  Peas (cooked) -  cup has 19 mcg.  Pickles - 1 spear has 14 mcg.  Pumpkin seeds - 1 oz. has 13 mcg.  Sauerkraut (canned) -  cup has 16 mcg.  Soybeans (cooked) -  cup has 16 mcg.  Tomato (raw) - 1 medium has 10 mcg.  Tomato sauce -  cup has 17 mcg. Vitamin K-free foods If a food contain less than 5 mcg per serving, it is considered to have no vitamin K. These foods include:  Bread and cereal products.  Cheese.  Eggs.  Fish and shellfish.  Meat and poultry.  Milk and dairy products.  Sunflower seeds. Actual amounts  of vitamin K in foods may be different depending on processing. Talk with your dietitian about what foods you can eat and what foods you should avoid. This information is not intended to replace advice given to you by your health care provider. Make sure you discuss any questions you have with your health care provider. Document Released: 11/05/2008 Document Revised: 07/31/2015 Document Reviewed: 04/13/2015 Elsevier Interactive Patient Education  2019 [employer].

## 2018-03-04 ENCOUNTER — Other Ambulatory Visit: Payer: Self-pay | Admitting: Pharmacist

## 2018-03-04 MED ORDER — WARFARIN SODIUM 4 MG PO TABS: ORAL_TABLET | ORAL | 0 refills | Status: DC

## 2018-03-04 MED ORDER — APIXABAN 5 MG PO TABS: 5.0000 mg | ORAL_TABLET | Freq: Two times a day (BID) | ORAL | 2 refills | Status: DC

## 2018-03-05 NOTE — Telephone Encounter (Signed)
Transition to [employer]  March/2 - Take Elquis 5mg  twice daily plus warfarin 4mg  in evening  March/3 - Take Eliquis 5mg  twice daily plus warfarin 4mg  in evening  March/4 - Take Eliquis 5mg  twice daily plus warfarin 4mg  in evening  March/5 - NO more eliquis, continue warfarin 4mg  daily in evening  March/6 - continue warfarin 4mg  daily in evening  March/7 - continue warfarin 4mg  daily in evening  March/8 - continue warfarin 4mg  daily in evening  March/9 - appointment with Coumadin Clinic at

## 2018-03-06 ENCOUNTER — Encounter: Payer: Self-pay | Admitting: Cardiology

## 2018-03-19 NOTE — Progress Notes (Signed)
HPI: Follow-up atrial fibrillation. Pt admitted January 2019 with small bowel obstruction.  She was noted to be in atrial fibrillation but converted spontaneously to sinus rhythm.  TSH was normal.  Echocardiogram January 2019 showed normal LV function, moderate diastolic dysfunction, mild mitral regurgitation and mild to moderate tricuspid regurgitation.  Also with history of carotid endarterectomy.  Carotid Dopplers October 2019 showed 1 to 39% right and greater than 50% in the distal common carotid artery on the left resulting in increased velocity in the internal carotid artery.  She has been treated with anticoagulation. Since last seen, she has dyspnea with more vigorous activities but not routine activities.  No orthopnea, PND or pedal edema.  No chest pain, palpitations or syncope.  Current Outpatient Medications  Medication Sig Dispense Refill  . acetaminophen (TYLENOL) 500 MG tablet Take 1,000 mg by mouth every 6 (six) hours as needed for mild pain, moderate pain, fever or headache.    Marland Kitchen amLODipine (NORVASC) 5 MG tablet Take 5 mg by mouth daily with breakfast.     . apixaban (ELIQUIS) 5 MG TABS tablet Take 1 tablet (5 mg total) by mouth 2 (two) times daily. 60 tablet 2  . atenolol (TENORMIN) 25 MG tablet Take 25 mg by mouth daily with breakfast.   3  . Coral Calcium (SM CORAL CALCIUM) 1000 (390 Ca) MG TABS Take 600 mg by mouth 2 (two) times daily.     Marland Kitchen LORazepam (ATIVAN) 1 MG tablet Take 0.5-1 mg by mouth 2 (two) times daily. Takes 0.5mg  right when she gets up for the day and then a little while later she takes 1mg     . losartan (COZAAR) 100 MG tablet Take 100 mg by mouth daily with breakfast.     . omeprazole (PRILOSEC) 40 MG capsule Take 40 mg by mouth daily with breakfast.     . oxybutynin (DITROPAN) 5 MG tablet Take 1 tablet by mouth every morning.    . sertraline (ZOLOFT) 100 MG tablet Take 100 mg by mouth at bedtime.     . sodium chloride (OCEAN) 0.65 % SOLN nasal spray Place  1 spray into both nostrils 2 (two) times daily.    Marland Kitchen warfarin (COUMADIN) 4 MG tablet TAKE 1 TABLET DAILY OR AS DIRECTED BY COUMADIN CLINIC 90 tablet 0   No current facility-administered medications for this visit.      Past Medical History:  Diagnosis Date  . Arthritis   . Atrial fibrillation (HCC)   . Carotid artery occlusion   . Colitis   . COPD (chronic obstructive pulmonary disease) (HCC)   . Diverticulitis   . Hypertension   . Keratosis, seborrheic     Past Surgical History:  Procedure Laterality Date  . ABDOMINAL HYSTERECTOMY    . BREAST EXCISIONAL BIOPSY Left   . BREAST EXCISIONAL BIOPSY Left   . BREAST EXCISIONAL BIOPSY Right   . BREAST SURGERY     cyst removal  . CAROTID ENDARTERECTOMY Left 2010  . CHOLECYSTECTOMY    . EYE SURGERY Bilateral 06/2014   cataracts  . JOINT REPLACEMENT     RIGHT KNEE  . REPLACEMENT TOTAL KNEE Right     Social History   Socioeconomic History  . Marital status: Widowed    Spouse name: Not on file  . Number of children: Not on file  . Years of education: Not on file  . Highest education level: Not on file  Occupational History  . Not on file  Social  Needs  . Financial resource strain: Not on file  . Food insecurity:    Worry: Not on file    Inability: Not on file  . Transportation needs:    Medical: Not on file    Non-medical: Not on file  Tobacco Use  . Smoking status: Former Smoker    Years: 50.00    Types: Cigarettes    Last attempt to quit: 02/04/2002    Years since quitting: 16.1  . Smokeless tobacco: Never Used  Substance and Sexual Activity  . Alcohol use: No  . Drug use: No  . Sexual activity: Not on file  Lifestyle  . Physical activity:    Days per week: Not on file    Minutes per session: Not on file  . Stress: Not on file  Relationships  . Social connections:    Talks on phone: Not on file    Gets together: Not on file    Attends religious service: Not on file    Active member of club or  organization: Not on file    Attends meetings of clubs or organizations: Not on file    Relationship status: Not on file  . Intimate partner violence:    Fear of current or ex partner: Not on file    Emotionally abused: Not on file    Physically abused: Not on file    Forced sexual activity: Not on file  Other Topics Concern  . Not on file  Social History Narrative  . Not on file    Family History  Problem Relation Age of Onset  . Heart disease Mother        After age 61  . AAA (abdominal aortic aneurysm) Sister   . AAA (abdominal aortic aneurysm) Brother   . Heart attack Brother   . Breast cancer Cousin   . Breast cancer Other   . Breast cancer Other     ROS: no fevers or chills, productive cough, hemoptysis, dysphasia, odynophagia, melena, hematochezia, dysuria, hematuria, rash, seizure activity, orthopnea, PND, pedal edema, claudication. Remaining systems are negative.  Physical Exam: Well-developed well-nourished in no acute distress.  Skin is warm and dry.  HEENT is normal.  Neck is supple.  Chest is clear to auscultation with normal expansion.  Cardiovascular exam is regular rate and rhythm.  Abdominal exam nontender or distended. No masses palpated. Extremities show no edema. neuro grossly intact  A/P  1 paroxysmal atrial fibrillation-patient remains in sinus rhythm on examination today.  Continue medical therapy with atenolol for rate control if atrial fibrillation recurs.  She has requested to discontinue apixaban.  We discussed the risk of embolic event including CVA and she understands.  She would still like to discontinue.  We will therefore add aspirin 81 mg daily.  2 hypertension-patient's blood pressure is controlled.  Continue present medications and follow.  3 carotid artery disease-plan to continue medical therapy.  Would add statin.  No aspirin given need for apixaban.  Followed by vascular surgery.   Olga Millers, MD

## 2018-03-24 ENCOUNTER — Ambulatory Visit: Payer: Medicare Other | Admitting: Cardiology

## 2018-03-24 ENCOUNTER — Encounter: Payer: Self-pay | Admitting: Cardiology

## 2018-03-24 VITALS — BP 130/76 | HR 69 | Ht 65.0 in | Wt 183.0 lb

## 2018-03-24 DIAGNOSIS — I679 Cerebrovascular disease, unspecified: Secondary | ICD-10-CM | POA: Diagnosis not present

## 2018-03-24 DIAGNOSIS — I480 Paroxysmal atrial fibrillation: Secondary | ICD-10-CM | POA: Diagnosis not present

## 2018-03-24 DIAGNOSIS — I10 Essential (primary) hypertension: Secondary | ICD-10-CM

## 2018-03-24 MED ORDER — ASPIRIN EC 81 MG PO TBEC: 81.0000 mg | DELAYED_RELEASE_TABLET | Freq: Every day | ORAL | 3 refills | Status: DC

## 2018-03-24 NOTE — Patient Instructions (Signed)
Medication Instructions:  STOP ELIQUIS  START ASPIRIN 81 MG ONCE DAILY If you need a refill on your cardiac medications before your next appointment, please call your pharmacy.   Lab work: If you have labs (blood work) drawn today and your tests are completely normal, you will receive your results only by: Marland Kitchen MyChart Message (if you have MyChart) OR . A paper copy in the mail If you have any lab test that is abnormal or we need to change your treatment, we will call you to review the results.  Follow-Up: At [facility], you and your health needs are our priority.  As part of our continuing mission to provide you with exceptional heart care, we have created designated Provider Care Teams.  These Care Teams include your primary Cardiologist (physician) and Advanced Practice Providers (APPs -  Physician Assistants and Nurse Practitioners) who all work together to provide you with the care you need, when you need it. You will need a follow up appointment in 6 months.  Please call our office 2 months in advance to schedule this appointment.  You may see Olga Millers, MD or one of the following Advanced Practice Providers on your designated Care Team:   Corine Shelter, PA-C Judy Pimple, [place] . Marjie Skiff, PA-C  CALL IN July TO SCHEDULE APPOINTMENT IN [place]

## 2018-03-25 ENCOUNTER — Emergency Department (HOSPITAL_COMMUNITY)
Admission: EM | Admit: 2018-03-25 | Discharge: 2018-03-26 | Disposition: A | Discharge status: Home / Self Care | Payer: Medicare Other | Attending: Emergency Medicine | Admitting: Emergency Medicine

## 2018-03-25 ENCOUNTER — Other Ambulatory Visit: Payer: Self-pay

## 2018-03-25 ENCOUNTER — Emergency Department (HOSPITAL_COMMUNITY): Payer: Medicare Other

## 2018-03-25 ENCOUNTER — Encounter (HOSPITAL_COMMUNITY): Payer: Self-pay

## 2018-03-25 DIAGNOSIS — W0110XA Fall on same level from slipping, tripping and stumbling with subsequent striking against unspecified object, initial encounter: Secondary | ICD-10-CM | POA: Diagnosis not present

## 2018-03-25 DIAGNOSIS — R2681 Unsteadiness on feet: Secondary | ICD-10-CM | POA: Insufficient documentation

## 2018-03-25 DIAGNOSIS — Z87891 Personal history of nicotine dependence: Secondary | ICD-10-CM | POA: Diagnosis not present

## 2018-03-25 DIAGNOSIS — Z7901 Long term (current) use of anticoagulants: Secondary | ICD-10-CM | POA: Diagnosis not present

## 2018-03-25 DIAGNOSIS — I10 Essential (primary) hypertension: Secondary | ICD-10-CM | POA: Diagnosis not present

## 2018-03-25 DIAGNOSIS — Y92009 Unspecified place in unspecified non-institutional (private) residence as the place of occurrence of the external cause: Secondary | ICD-10-CM | POA: Insufficient documentation

## 2018-03-25 DIAGNOSIS — Z96651 Presence of right artificial knee joint: Secondary | ICD-10-CM | POA: Diagnosis not present

## 2018-03-25 DIAGNOSIS — Z79899 Other long term (current) drug therapy: Secondary | ICD-10-CM | POA: Insufficient documentation

## 2018-03-25 DIAGNOSIS — Y9301 Activity, walking, marching and hiking: Secondary | ICD-10-CM | POA: Diagnosis not present

## 2018-03-25 DIAGNOSIS — J449 Chronic obstructive pulmonary disease, unspecified: Secondary | ICD-10-CM | POA: Diagnosis not present

## 2018-03-25 DIAGNOSIS — M545 Low back pain: Secondary | ICD-10-CM | POA: Diagnosis present

## 2018-03-25 DIAGNOSIS — S22080A Wedge compression fracture of T11-T12 vertebra, initial encounter for closed fracture: Secondary | ICD-10-CM

## 2018-03-25 DIAGNOSIS — Z7982 Long term (current) use of aspirin: Secondary | ICD-10-CM | POA: Insufficient documentation

## 2018-03-25 DIAGNOSIS — Y999 Unspecified external cause status: Secondary | ICD-10-CM | POA: Diagnosis not present

## 2018-03-25 DIAGNOSIS — W19XXXA Unspecified fall, initial encounter: Secondary | ICD-10-CM

## 2018-03-25 LAB — BASIC METABOLIC PANEL
Anion gap: 11 (ref 5–15)
BUN: 8 mg/dL (ref 8–23)
CALCIUM: 9 mg/dL (ref 8.9–10.3)
CO2: 22 mmol/L (ref 22–32)
Chloride: 102 mmol/L (ref 98–111)
Creatinine, Ser: 0.94 mg/dL (ref 0.44–1.00)
GFR calc Af Amer: >60 mL/min (ref >60)
GFR calc non Af Amer: 57 mL/min — ABNORMAL LOW (ref >60)
GLUCOSE: 111 mg/dL — AB (ref 70–99)
Potassium: 3.5 mmol/L (ref 3.5–5.1)
Sodium: 135 mmol/L (ref 135–145)

## 2018-03-25 LAB — CBC
HCT: 42.2 % (ref 36.0–46.0)
Hemoglobin: 13.5 g/dL (ref 12.0–15.0)
MCH: 26.8 pg (ref 26.0–34.0)
MCHC: 32 g/dL (ref 30.0–36.0)
MCV: 83.9 fL (ref 80.0–100.0)
Platelets: 286 10*3/uL (ref 150–400)
RBC: 5.03 MIL/uL (ref 3.87–5.11)
RDW: 13.9 % (ref 11.5–15.5)
WBC: 10.7 10*3/uL — ABNORMAL HIGH (ref 4.0–10.5)
nRBC: 0 % (ref 0.0–0.2)

## 2018-03-25 MED ORDER — ACETAMINOPHEN 500 MG PO TABS
1000.0000 mg | ORAL_TABLET | Freq: Once | ORAL | Status: AC
  Administered 2018-03-26: 1000 mg via ORAL
  Filled 2018-03-25: qty 2

## 2018-03-25 MED ORDER — SODIUM CHLORIDE 0.9% FLUSH: 3.0000 mL | Freq: Once | INTRAVENOUS | Status: DC

## 2018-03-25 MED ORDER — OXYCODONE HCL 5 MG PO TABS
2.5000 mg | ORAL_TABLET | Freq: Once | ORAL | Status: AC
  Administered 2018-03-26: 2.5 mg via ORAL
  Filled 2018-03-25: qty 1

## 2018-03-25 NOTE — ED Provider Notes (Signed)
[facility] EMERGENCY DEPARTMENT Provider Note   CSN: [id number] Arrival date & time: 03/25/18  1648    History   Chief Complaint Chief Complaint  Patient presents with  . Fall    HPI [name] is a 84 y.o. female.     84 yo F with a chief complaint of a fall.  Patient fell twice today.  The first time she was walking with multiple items in her hands and she thinks she lost her balance and fell down backwards and landed on her bottom.  Complaining of pain to her low back.  Had difficulty getting up.  She had another fall when she was trying to put the harness on her dog and she lost her balance and she slid down the door.  Denies any injury in that event.  She feels that she has been a bit unsteady for some time.  Had a fall back in December but is not followed since then.  She was worried about getting up with her low back pain and so called 911.  She denies headaches chest pain.  Denies syncope.  Denies lower extremity pain.  Complains of pain to the low back diffusely about the lower aspect of the L-spine.  Denies loss of bowel or bladder denies loss.  Sensation denies weakness or numbness to the legs.  The history is provided by the patient.  Fall  This is a recurrent problem. The current episode started 2 days ago. The problem occurs constantly. The problem has not changed since onset.Pertinent negatives include no chest pain, no abdominal pain, no headaches and no shortness of breath. Nothing aggravates the symptoms. Nothing relieves the symptoms. She has tried nothing for the symptoms. The treatment provided no relief.    Past Medical History:  Diagnosis Date  . Arthritis   . Atrial fibrillation (HCC)   . Carotid artery occlusion   . Colitis   . COPD (chronic obstructive pulmonary disease) (HCC)   . Diverticulitis   . Hypertension   . Keratosis, seborrheic     Patient Active Problem List   Diagnosis Date Noted  . Atrial fibrillation (HCC) 03/03/2018    . Long term (current) use of anticoagulants 03/03/2018  . SBO (small bowel obstruction) (HCC) 01/26/2017  . Encounter for postoperative carotid endarterectomy surveillance 09/21/2015  . Occlusion and stenosis of carotid artery without mention of cerebral infarction 02/05/2012    Past Surgical History:  Procedure Laterality Date  . ABDOMINAL HYSTERECTOMY    . BREAST EXCISIONAL BIOPSY Left   . BREAST EXCISIONAL BIOPSY Left   . BREAST EXCISIONAL BIOPSY Right   . BREAST SURGERY     cyst removal  . CAROTID ENDARTERECTOMY Left 2010  . CHOLECYSTECTOMY    . EYE SURGERY Bilateral 06/2014   cataracts  . JOINT REPLACEMENT     RIGHT KNEE  . REPLACEMENT TOTAL KNEE Right      OB History   No obstetric history on file.      Home Medications    Prior to Admission medications   Medication Sig Start Date End Date Taking? Authorizing Provider  acetaminophen (TYLENOL) 500 MG tablet Take 1,000 mg by mouth every 6 (six) hours as needed for mild pain, moderate pain, fever or headache.    [provider]  amLODipine (NORVASC) 5 MG tablet Take 5 mg by mouth daily with breakfast.     [provider]  aspirin EC 81 MG tablet Take 1 tablet (81 mg total) by  mouth daily. 03/24/18   Lewayne Bunting, MD  atenolol (TENORMIN) 25 MG tablet Take 25 mg by mouth daily with breakfast.  07/27/14   [provider]  Coral Calcium (SM CORAL CALCIUM) 1000 (390 Ca) MG TABS Take 600 mg by mouth 2 (two) times daily.     [provider]  diclofenac sodium (VOLTAREN) 1 % GEL Apply 4 g topically 4 (four) times daily. 03/26/18   Melene Plan, DO  LORazepam (ATIVAN) 1 MG tablet Take 0.5-1 mg by mouth 2 (two) times daily. Takes 0.5mg  right when she gets up for the day and then a little while later she takes 1mg     [provider]  losartan (COZAAR) 100 MG tablet Take 100 mg by mouth daily with breakfast.     [provider]  omeprazole (PRILOSEC) 40 MG capsule Take 40 mg by  mouth daily with breakfast.     [provider]  oxybutynin (DITROPAN) 5 MG tablet Take 1 tablet by mouth every morning. 06/10/17   [provider]  sertraline (ZOLOFT) 100 MG tablet Take 100 mg by mouth at bedtime.     [provider]  sodium chloride (OCEAN) 0.65 % SOLN nasal spray Place 1 spray into both nostrils 2 (two) times daily.    [provider]  warfarin (COUMADIN) 4 MG tablet TAKE 1 TABLET DAILY OR AS DIRECTED BY COUMADIN CLINIC 03/04/18   Lewayne Bunting, MD    Family History Family History  Problem Relation Age of Onset  . Heart disease Mother        After age 66  . AAA (abdominal aortic aneurysm) Sister   . AAA (abdominal aortic aneurysm) Brother   . Heart attack Brother   . Breast cancer Cousin   . Breast cancer Other   . Breast cancer Other     Social History Social History   Tobacco Use  . Smoking status: Former Smoker    Years: 50.00    Types: Cigarettes    Last attempt to quit: 02/04/2002    Years since quitting: 16.1  . Smokeless tobacco: Never Used  Substance Use Topics  . Alcohol use: No  . Drug use: No     Allergies   Amoxicillin and Tetracyclines & related   Review of Systems Review of Systems  Constitutional: Negative for chills and fever.  HENT: Negative for congestion and rhinorrhea.   Eyes: Negative for redness and visual disturbance.  Respiratory: Negative for shortness of breath and wheezing.   Cardiovascular: Negative for chest pain and palpitations.  Gastrointestinal: Negative for abdominal pain, nausea and vomiting.  Genitourinary: Negative for dysuria and urgency.  Musculoskeletal: Positive for back pain and gait problem. Negative for arthralgias and myalgias.  Skin: Negative for pallor and wound.  Neurological: Negative for dizziness and headaches.     Physical Exam Updated Vital Signs BP (!) 143/101   Pulse 67   Temp 98.5 F (36.9 C) (Oral)   Resp 13   Ht 5\' 5"  (1.651 m)   Wt 83 kg    SpO2 94%   BMI 30.45 kg/m   Physical Exam Vitals signs and nursing note reviewed.  Constitutional:      General: She is not in acute distress.    Appearance: She is well-developed. She is not diaphoretic.  HENT:     Head: Normocephalic and atraumatic.  Eyes:     Pupils: Pupils are equal, round, and reactive to light.  Neck:     Musculoskeletal:  Normal range of motion and neck supple.  Cardiovascular:     Rate and Rhythm: Normal rate and regular rhythm.     Heart sounds: No murmur. No friction rub. No gallop.   Pulmonary:     Effort: Pulmonary effort is normal.     Breath sounds: No wheezing or rales.  Abdominal:     General: There is no distension.     Palpations: Abdomen is soft.     Tenderness: There is no abdominal tenderness.  Musculoskeletal:        General: Tenderness present.     Comments: Mild tenderness about lower l spine.  Patient was palpated from head to toe without any noted areas of bony tenderness.  Skin:    General: Skin is warm and dry.  Neurological:     Mental Status: She is alert and oriented to person, place, and time.  Psychiatric:        Behavior: Behavior normal.      ED Treatments / Results  Labs (all labs ordered are listed, but only abnormal results are displayed) Labs Reviewed  BASIC METABOLIC PANEL - Abnormal; Notable for the following components:      Result Value   Glucose, Bld 111 (*)    GFR calc non Af Amer 57 (*)    All other components within normal limits  CBC - Abnormal; Notable for the following components:   WBC 10.7 (*)    All other components within normal limits  PROTIME-INR    EKG EKG Interpretation  Date/Time:  Tuesday March 25 2018 16:59:26 EST Ventricular Rate:  63 PR Interval:  140 QRS Duration: 76 QT Interval:  438 QTC Calculation: 448 R Axis:   41 Text Interpretation:  Normal sinus rhythm Normal ECG no wpw, prolonged qt or brugada nsr replaced afib Otherwise no significant change Confirmed by Melene Plan  [phone]) on 03/25/2018 11:19:28 PM   Radiology Dg Lumbar Spine Complete  Result Date: 03/26/2018 CLINICAL DATA:  Multiple falls EXAM: LUMBAR SPINE - COMPLETE 4+ VIEW COMPARISON:  CT abdomen pelvis 01/26/2017 FINDINGS: Mild depression of the superior endplate of L4, age indeterminate. Multilevel facet hypertrophy. Grade 1 anterolisthesis at L3-4 is unchanged. Disc space narrowing is greatest at L4-5. IMPRESSION: Age indeterminate compression fracture of the superior endplate of L4 versus projectional artifact. MRI or CT of the lumbar spine may be helpful for further characterization. Electronically Signed   By: Deatra Robinson M.D.   On: 03/26/2018 00:55   Ct Head Wo Contrast  Result Date: 03/25/2018 CLINICAL DATA:  Larey Seat today, no head injury. Recently discontinued blood thinners. EXAM: CT HEAD WITHOUT CONTRAST TECHNIQUE: Contiguous axial images were obtained from the base of the skull through the vertex without intravenous contrast. COMPARISON:  None. FINDINGS: BRAIN: No intraparenchymal hemorrhage, mass effect nor midline shift. No parenchymal brain volume loss for age. No hydrocephalus. Patchy supratentorial white matter hypodensities loss than expected for patient's age, though non-specific are most compatible with chronic small vessel ischemic disease. No acute large vascular territory infarcts. No abnormal extra-axial fluid collections. Basal cisterns are patent. VASCULAR: Moderate calcific atherosclerosis of the carotid siphons. SKULL: No skull fracture. Chronic appearing bilateral nasal bone fractures displaced to the RIGHT. Moderate RIGHT temporomandibular osteoarthrosis. No significant scalp soft tissue swelling. Calcification about the cervicomedullary junction seen with CPPD. SINUSES/ORBITS: Trace paranasal sinus mucosal thickening. Mastoid air cells are well aerated.The included ocular globes and orbital contents are non-suspicious. Status post bilateral ocular lens implants. OTHER: Patient is  edentulous. Negative non-contrast  CT HEAD for age. Electronically Signed   By: Awilda Metro M.D.   On: 03/25/2018 18:39   Ct Lumbar Spine Wo Contrast  Result Date: 03/26/2018 CLINICAL DATA:  Fall with low back pain EXAM: CT LUMBAR SPINE WITHOUT CONTRAST TECHNIQUE: Multidetector CT imaging of the lumbar spine was performed without intravenous contrast administration. Multiplanar CT image reconstructions were also generated. COMPARISON:  Lumbar spine radiograph 03/26/2018 CT abdomen pelvis 01/26/2017 FINDINGS: Segmentation: 5 lumbar type vertebrae. Alignment: Normal. Vertebrae: There is an acute wedge compression fracture of T12 involving the superior endplate and anterior wall with approximately 10% height loss. No retropulsion. There is no other acute fracture. The L4 abnormality questioned on the radiograph corresponds to a sclerotic band extending through the left superior corner of the vertebral body. Paraspinal and other soft tissues: There is calcific aortic atherosclerosis. There is rectosigmoid diverticulosis. Disc levels: There is moderate L4-5 spinal canal stenosis. There is moderate-to-severe facet hypertrophy at the L2-3, L3-4 and L5-S1 levels. There is moderate left L4 and severe left L5 neural foraminal stenosis. IMPRESSION: 1. Acute wedge compression fracture of T12 involving the superior endplate and anterior wall with approximately 10% height loss. No retropulsion. 2. Moderate L4-5 spinal canal stenosis. 3. Severe left L5 and moderate left L4 neural foraminal stenosis. Electronically Signed   By: Deatra Robinson M.D.   On: 03/26/2018 02:08    Procedures Procedures (including critical care time)  Medications Ordered in ED Medications  sodium chloride flush (NS) 0.9 % injection 3 mL (has no administration in time range)  acetaminophen (TYLENOL) tablet 1,000 mg (1,000 mg Oral Given 03/26/18 0046)  oxyCODONE (Oxy IR/ROXICODONE) immediate release tablet 2.5 mg (2.5 mg Oral Given 03/26/18 0046)      Initial Impression / Assessment and Plan / ED Course  I have reviewed the triage vital signs and the nursing notes.  Pertinent labs & imaging results that were available during my care of the patient were reviewed by me and considered in my medical decision making (see chart for details).        84 yo F with a chief complaint of a fall.  Sounds mechanical by history the patient is unsure exactly what happened.  Lab work performed in triage with no concerning finding.  She had a CT of the head done in triage it was negative.  She is complaining mostly of low back pain, will obtain a plain film.  And will attempt to ambulate.  Patient is able to ambulate with very minimal assistance to the bathroom.  She feels like her legs are a bit shaky though has not had anything to eat since yesterday.  She was given something to drink and eat and feels much better.  Plain film was concerning for a age-indeterminate L4 compression fracture.  CT scan was ordered which shows no abnormality at L4 but she does have a T12 compression fracture with less than 10% height loss.  I discussed the results with the patient.  As she has been having difficulty at home and would like to go home I will order home health orders.  Consult to case management.  Have her call her family doctor in the morning and discuss her visit here.  3:48 AM:  I have discussed the diagnosis/risks/treatment options with the patient and believe the pt to be eligible for discharge home to follow-up with PCP. We also discussed returning to the ED immediately if new or worsening sx occur. We discussed the sx which are most concerning (  e.g., sudden worsening pain, fever, inability to tolerate by mouth) that necessitate immediate return. Medications administered to the patient during their visit and any new prescriptions provided to the patient are listed below.  Medications given during this visit Medications  sodium chloride flush (NS) 0.9 %  injection 3 mL (has no administration in time range)  acetaminophen (TYLENOL) tablet 1,000 mg (1,000 mg Oral Given 03/26/18 0046)  oxyCODONE (Oxy IR/ROXICODONE) immediate release tablet 2.5 mg (2.5 mg Oral Given 03/26/18 0046)     The patient appears reasonably screen and/or stabilized for discharge and I doubt any other medical condition or other [facility] requiring further screening, evaluation, or treatment in the ED at this time prior to discharge.    Final Clinical Impressions(s) / ED Diagnoses   Final diagnoses:  Fall in home, initial encounter  Compression fracture of T12 vertebra, initial encounter [facility])    ED Discharge Orders         Ordered    Home Health     03/26/18 0345    Face-to-face encounter (required for Medicare/Medicaid patients)    Comments:  I Rae Roam certify that this patient is under my care and that I, or a nurse practitioner or physician's assistant working with me, had a face-to-face encounter that meets the physician face-to-face encounter requirements with this patient on 03/26/2018. The encounter with the patient was in whole, or in part for the following medical condition(s) which is the primary reason for home health care (List medical condition): Frequent falls over the past 3 months, new T12 compression fracture.   03/26/18 0345    diclofenac sodium (VOLTAREN) 1 % GEL  4 times daily     03/26/18 0346           Melene Plan, DO 03/26/18 [phone]

## 2018-03-25 NOTE — ED Triage Notes (Signed)
Pt here for multiple falls. Pt fell once this morning and once later in the day, unsure if pt hit her head. Pt stopped taking eliquis 2 days ago and PCP aware. Pt a.o, nad noted.

## 2018-03-26 ENCOUNTER — Emergency Department (HOSPITAL_COMMUNITY): Payer: Medicare Other

## 2018-03-26 MED ORDER — DICLOFENAC SODIUM 1 % TD GEL: 4.0000 g | Freq: Four times a day (QID) | TRANSDERMAL | 0 refills | Status: DC

## 2018-03-26 NOTE — Discharge Instructions (Addendum)
Take tylenol 1000mg (2 extra strength) four times a day. Use the gel as prescribed.   Return for worsening pain, inability to walk, weakness or numbness to your legs

## 2018-04-04 ENCOUNTER — Telehealth (INDEPENDENT_AMBULATORY_CARE_PROVIDER_SITE_OTHER): Payer: Self-pay | Admitting: Orthopaedic Surgery

## 2018-04-04 NOTE — Telephone Encounter (Signed)
This is a new injury.  Best to f/u with a back dr. To discuss further.  Can we get her in to see nitka or yates?

## 2018-04-04 NOTE — Telephone Encounter (Signed)
Please call and make appt with Dr Gasper Sells per [place] thanks.

## 2018-04-04 NOTE — Telephone Encounter (Signed)
See message please call.

## 2018-04-04 NOTE — Telephone Encounter (Signed)
Patient is scheduled with Dr Ophelia Charter 04/16/2018

## 2018-04-04 NOTE — Telephone Encounter (Signed)
New Message  Pt verbalized she has a compression fracture in her lower back and wants to speak to nurse or MD about it.  Please f/u

## 2018-04-15 ENCOUNTER — Telehealth (INDEPENDENT_AMBULATORY_CARE_PROVIDER_SITE_OTHER): Payer: Self-pay | Admitting: Radiology

## 2018-04-15 NOTE — Telephone Encounter (Signed)
I have called patient and left message to return call about their upcoming appointment. Please ask these questions in pre-screening patient. Thank you!  Do you have now or have you had in the past 7 days a fever and/or chills? Do you have now or have you had in the past 7 days a cough? Do you have now or have you had in the last 7 days nausea, vomiting or abdominal pain? Have you been exposed to anyone who has tested positive for COVID-19? Have you or anyone who lives with you traveled within the last month? 

## 2018-04-16 ENCOUNTER — Ambulatory Visit (INDEPENDENT_AMBULATORY_CARE_PROVIDER_SITE_OTHER): Payer: Medicare Other

## 2018-04-16 ENCOUNTER — Ambulatory Visit (INDEPENDENT_AMBULATORY_CARE_PROVIDER_SITE_OTHER): Payer: Medicare Other | Admitting: Orthopaedic Surgery

## 2018-04-16 ENCOUNTER — Encounter (INDEPENDENT_AMBULATORY_CARE_PROVIDER_SITE_OTHER): Payer: Self-pay | Admitting: Orthopaedic Surgery

## 2018-04-16 ENCOUNTER — Other Ambulatory Visit: Payer: Self-pay

## 2018-04-16 VITALS — Ht 65.0 in | Wt 182.0 lb

## 2018-04-16 DIAGNOSIS — W19XXXA Unspecified fall, initial encounter: Secondary | ICD-10-CM

## 2018-04-16 DIAGNOSIS — S22080A Wedge compression fracture of T11-T12 vertebra, initial encounter for closed fracture: Secondary | ICD-10-CM

## 2018-04-16 DIAGNOSIS — M549 Dorsalgia, unspecified: Secondary | ICD-10-CM | POA: Diagnosis not present

## 2018-04-16 MED ORDER — TRAMADOL HCL 50 MG PO TABS: 50.0000 mg | ORAL_TABLET | Freq: Two times a day (BID) | ORAL | 1 refills | Status: DC | PRN

## 2018-04-16 NOTE — Progress Notes (Signed)
Office Visit Note   Patient: [name]           Date of Birth: December 13, 1934           MRN: [id number] Visit Date: 04/16/2018              Requested by: Blair Heys, MD [address] [place], [place] [address] PCP: Blair Heys, MD   Assessment & Plan: Visit Diagnoses:  1. Mid back pain   2. Compression fracture of T12 vertebra, initial encounter (HCC)     Plan: Continue ambulation with a walker.  Tramadol prescribed for pain that she can use intermittently.  Her pain is getting better every week.  We discussed vertebroplasty and she appears to be healing with decrease in her symptoms every week.  Recheck 4 weeks with lateral x-ray thoracic spine on return.  Follow-Up Instructions: Return in about 4 weeks (around 05/14/2018).   Orders:  Orders Placed This Encounter  Procedures  . XR Thoracic Spine 2 View   Meds ordered this encounter  Medications  . traMADol (ULTRAM) 50 MG tablet    Sig: Take 1 tablet (50 mg total) by mouth every 12 (twelve) hours as needed.    Dispense:  30 tablet    Refill:  1      Procedures: No procedures performed   Clinical Data: No additional findings.   Subjective: Chief Complaint  Patient presents with  . Lower Back - Pain    DOI 03/25/2018    HPI 84 year old female seen with a fall on 03/25/2018 with a T12 compression fracture.  She had initial x-rays which on closer review and image enhancement did show T12 compression and ultimately had an CT scan lumbar on 03/26/2018 which showed anterior wedge compression fracture of T12 with approximately 10% loss of height without retropulsion.  Patient states week to week her pain is gotten better she has been using Tylenol but does not seem to give her a lot of relief.  No associated bowel or bladder symptoms.  Review of Systems view of system positive for carotid occlusion without CVA.  History of atrial fib.  Use of anticoagulant.  Fall 03/25/2018 otherwise 14 point view of systems  negative is obtains HPI.   Objective: Vital Signs: Ht 5\' 5"  (1.651 m)   Wt 182 lb (82.6 kg)   BMI 30.29 kg/m   Physical Exam Constitutional:      Appearance: She is well-developed.  HENT:     Head: Normocephalic.     Right Ear: External ear normal.     Left Ear: External ear normal.  Eyes:     Pupils: Pupils are equal, round, and reactive to light.  Neck:     Thyroid: No thyromegaly.     Trachea: No tracheal deviation.  Cardiovascular:     Rate and Rhythm: Normal rate.  Pulmonary:     Effort: Pulmonary effort is normal.  Abdominal:     Palpations: Abdomen is soft.  Skin:    General: Skin is warm and dry.  Neurological:     Mental Status: She is alert and oriented to person, place, and time.  Psychiatric:        Behavior: Behavior normal.     Ortho Exam patient is amatory with a walker.  Quads anterior tib gastrocsoleus is active.  Specialty Comments:  No specialty comments available.  Imaging: Xr Thoracic Spine 2 View  Result Date: 04/16/2018 AP lateral thoracic spine x-rays are obtained.  This  shows a right thoracic curve with a left thoracolumbar curve.  Compression fracture of T12 with 50% anterior wedging.  No retropulsion is noted. Impression: T12 compression fracture which is progressed from 10% to 50% since previous MRI scan on 03/26/2018    PMFS History: Patient Active Problem List   Diagnosis Date Noted  . Compression fracture of T12 vertebra (HCC) 04/16/2018  . Atrial fibrillation (HCC) 03/03/2018  . Long term (current) use of anticoagulants 03/03/2018  . SBO (small bowel obstruction) (HCC) 01/26/2017  . Encounter for postoperative carotid endarterectomy surveillance 09/21/2015  . Occlusion and stenosis of carotid artery without mention of cerebral infarction 02/05/2012   Past Medical History:  Diagnosis Date  . Arthritis   . Atrial fibrillation (HCC)   . Carotid artery occlusion   . Colitis   . COPD (chronic obstructive pulmonary disease) (HCC)    . Diverticulitis   . Hypertension   . Keratosis, seborrheic     Family History  Problem Relation Age of Onset  . Heart disease Mother        After age 33  . AAA (abdominal aortic aneurysm) Sister   . AAA (abdominal aortic aneurysm) Brother   . Heart attack Brother   . Breast cancer Cousin   . Breast cancer Other   . Breast cancer Other     Past Surgical History:  Procedure Laterality Date  . ABDOMINAL HYSTERECTOMY    . BREAST EXCISIONAL BIOPSY Left   . BREAST EXCISIONAL BIOPSY Left   . BREAST EXCISIONAL BIOPSY Right   . BREAST SURGERY     cyst removal  . CAROTID ENDARTERECTOMY Left 2010  . CHOLECYSTECTOMY    . EYE SURGERY Bilateral 06/2014   cataracts  . JOINT REPLACEMENT     RIGHT KNEE  . REPLACEMENT TOTAL KNEE Right    Social History   Occupational History  . Not on file  Tobacco Use  . Smoking status: Former Smoker    Years: 50.00    Types: Cigarettes    Last attempt to quit: 02/04/2002    Years since quitting: 16.2  . Smokeless tobacco: Never Used  Substance and Sexual Activity  . Alcohol use: No  . Drug use: No  . Sexual activity: Not on file

## 2018-05-14 ENCOUNTER — Other Ambulatory Visit: Payer: Self-pay

## 2018-05-14 ENCOUNTER — Encounter (INDEPENDENT_AMBULATORY_CARE_PROVIDER_SITE_OTHER): Payer: Self-pay | Admitting: Orthopaedic Surgery

## 2018-05-14 ENCOUNTER — Ambulatory Visit (INDEPENDENT_AMBULATORY_CARE_PROVIDER_SITE_OTHER): Payer: Self-pay

## 2018-05-14 ENCOUNTER — Ambulatory Visit (INDEPENDENT_AMBULATORY_CARE_PROVIDER_SITE_OTHER): Payer: Medicare Other | Admitting: Orthopaedic Surgery

## 2018-05-14 VITALS — Ht 65.0 in | Wt 182.0 lb

## 2018-05-14 DIAGNOSIS — S22080A Wedge compression fracture of T11-T12 vertebra, initial encounter for closed fracture: Secondary | ICD-10-CM

## 2018-05-14 NOTE — Progress Notes (Signed)
Office Visit Note   Patient: Lori PontoCarol S Kulakowski           Date of Birth: May 09, 1934           MRN: [id number] Visit Date: 05/14/2018              Requested by: Blair HeysEhinger, Robert, MD [address] [place], [place] [address] PCP: Blair HeysEhinger, Robert, MD   Assessment & Plan: Visit Diagnoses:  1. Compression fracture of T12 vertebra, initial encounter (HCC)     Plan: We discussed continued calcium adding vitamin D.  Walking program 2 miles a day.  She can return if she has increased symptoms or recurrent problems..  Follow-Up Instructions: Return if symptoms worsen or fail to improve.   Orders:  Orders Placed This Encounter  Procedures  . XR Thoracic Spine 2 View   No orders of the defined types were placed in this encounter.     Procedures: No procedures performed   Clinical Data: No additional findings.   Subjective: Chief Complaint  Patient presents with  . Middle Back - Fracture, Follow-up    Fall 03/25/2018, T12 compression fx    HPI patient returns follow-up T12 compression fracture with fall 03/25/2018.  She is not been in a brace she is getting weekly improvement and has certainly more than 50% pain relief.  States she had some problems getting out of the tub today and tripped but did not fall.  She is used Tylenol primarily for pain occasionally tramadol.  No numbness or tingling in her legs no bowel bladder associated symptoms.  Review of Systems 14 point systems updated unchanged from last office visit.  Previous carotid endarterectomies.  History of atrial fibrillation.   Objective: Vital Signs: Ht 5\' 5"  (1.651 m)   Wt 182 lb (82.6 kg)   BMI 30.29 kg/m   Physical Exam Constitutional:      Appearance: She is well-developed.  HENT:     Head: Normocephalic.     Right Ear: External ear normal.     Left Ear: External ear normal.  Eyes:     Pupils: Pupils are equal, round, and reactive to light.  Neck:     Thyroid: No thyromegaly.     Trachea: No  tracheal deviation.  Cardiovascular:     Rate and Rhythm: Normal rate.  Pulmonary:     Effort: Pulmonary effort is normal.  Abdominal:     Palpations: Abdomen is soft.  Skin:    General: Skin is warm and dry.  Neurological:     Mental Status: She is alert and oriented to person, place, and time.  Psychiatric:        Behavior: Behavior normal.     Ortho Exam anterior tib gastrocsoleus is strong.  She is independently ambulatory.  No sensory deficit in her lower extremities.  Specialty Comments:  No specialty comments available.  Imaging: No results found.   PMFS History: Patient Active Problem List   Diagnosis Date Noted  . Compression fracture of T12 vertebra (HCC) 04/16/2018  . Atrial fibrillation (HCC) 03/03/2018  . SBO (small bowel obstruction) (HCC) 01/26/2017  . Encounter for postoperative carotid endarterectomy surveillance 09/21/2015  . Occlusion and stenosis of carotid artery without mention of cerebral infarction 02/05/2012   Past Medical History:  Diagnosis Date  . Arthritis   . Atrial fibrillation (HCC)   . Carotid artery occlusion   . Colitis   . COPD (chronic obstructive pulmonary disease) (HCC)   . Diverticulitis   .  Hypertension   . Keratosis, seborrheic     Family History  Problem Relation Age of Onset  . Heart disease Mother        After age 38  . AAA (abdominal aortic aneurysm) Sister   . AAA (abdominal aortic aneurysm) Brother   . Heart attack Brother   . Breast cancer Cousin   . Breast cancer Other   . Breast cancer Other     Past Surgical History:  Procedure Laterality Date  . ABDOMINAL HYSTERECTOMY    . BREAST EXCISIONAL BIOPSY Left   . BREAST EXCISIONAL BIOPSY Left   . BREAST EXCISIONAL BIOPSY Right   . BREAST SURGERY     cyst removal  . CAROTID ENDARTERECTOMY Left 2010  . CHOLECYSTECTOMY    . EYE SURGERY Bilateral 06/2014   cataracts  . JOINT REPLACEMENT     RIGHT KNEE  . REPLACEMENT TOTAL KNEE Right    Social History    Occupational History  . Not on file  Tobacco Use  . Smoking status: Former Smoker    Years: 50.00    Types: Cigarettes    Last attempt to quit: 02/04/2002    Years since quitting: 16.2  . Smokeless tobacco: Never Used  Substance and Sexual Activity  . Alcohol use: No  . Drug use: No  . Sexual activity: Not on file

## 2018-07-23 ENCOUNTER — Ambulatory Visit (INDEPENDENT_AMBULATORY_CARE_PROVIDER_SITE_OTHER): Payer: Medicare Other | Admitting: Orthopaedic Surgery

## 2018-07-23 ENCOUNTER — Encounter: Payer: Self-pay | Admitting: Orthopaedic Surgery

## 2018-07-23 ENCOUNTER — Other Ambulatory Visit: Payer: Self-pay

## 2018-07-23 ENCOUNTER — Ambulatory Visit: Payer: Medicare Other

## 2018-07-23 DIAGNOSIS — M25531 Pain in right wrist: Secondary | ICD-10-CM

## 2018-07-23 NOTE — Progress Notes (Signed)
Office Visit Note   Patient: [name]           Date of Birth: Jul 11, 1934           MRN: [id number] Visit Date: 07/23/2018              Requested by: Gaynelle Arabian, MD [address] [place],  [place] [address] PCP: Gaynelle Arabian, MD   Assessment & Plan: Visit Diagnoses:  1. Pain in right wrist     Plan: Impression is right wrist pain questionable nondisplaced distal radius fracture of the volar rim.  We will immobilized in a short arm cast and reevaluate next week with three-view x-rays of the right wrist out of the cast.  Questions encouraged and answered.  Follow-Up Instructions: Return in about 1 week (around 07/30/2018).   Orders:  Orders Placed This Encounter  Procedures  . XR Wrist Complete Right   No orders of the defined types were placed in this encounter.     Procedures: No procedures performed   Clinical Data: No additional findings.   Subjective: Chief Complaint  Patient presents with  . Right Wrist - Pain    S/p fall on 07/16/2018    [name] is a 84 year old female comes in for evaluation of acute right wrist pain status post mechanical fall onto an outstretched hand 1 week ago.  She states that she has pain and swelling with use and movement of the right hand with weightbearing on her walker.  She denies any numbness and tingling.   Review of Systems  Constitutional: Negative.   HENT: Negative.   Eyes: Negative.   Respiratory: Negative.   Cardiovascular: Negative.   Endocrine: Negative.   Musculoskeletal: Negative.   Neurological: Negative.   Hematological: Negative.   Psychiatric/Behavioral: Negative.   All other systems reviewed and are negative.    Objective: Vital Signs: There were no vitals taken for this visit.  Physical Exam Vitals signs and nursing note reviewed.  Constitutional:      Appearance: She is well-developed.  HENT:     Head: Normocephalic and atraumatic.  Neck:     Musculoskeletal: Neck supple.   Pulmonary:     Effort: Pulmonary effort is normal.  Abdominal:     Palpations: Abdomen is soft.  Skin:    General: Skin is warm.     Capillary Refill: Capillary refill takes less than 2 seconds.  Neurological:     Mental Status: She is alert and oriented to person, place, and time.  Psychiatric:        Behavior: Behavior normal.        Thought Content: Thought content normal.        Judgment: Judgment normal.     Ortho Exam Right wrist and hand exam shows mild to moderate swelling.  No neurovascular compromise.  She does have some mild generalized tenderness throughout the wrist.  She has some increased pain with terminal range of motion.  No significant pain in the causing withdrawal. Specialty Comments:  No specialty comments available.  Imaging: Xr Wrist Complete Right  Result Date: 07/23/2018 Questionable nondisplaced distal radius fracture of the volar rim.      PMFS History: Patient Active Problem List   Diagnosis Date Noted  . Compression fracture of T12 vertebra ([place]) 04/16/2018  . Atrial fibrillation ([place]) 03/03/2018  . SBO (small bowel obstruction) ([place]) 01/26/2017  . Encounter for postoperative carotid endarterectomy surveillance 09/21/2015  . Occlusion and stenosis of carotid artery without  mention of cerebral infarction 02/05/2012   Past Medical History:  Diagnosis Date  . Arthritis   . Atrial fibrillation (HCC)   . Carotid artery occlusion   . Colitis   . COPD (chronic obstructive pulmonary disease) (HCC)   . Diverticulitis   . Hypertension   . Keratosis, seborrheic     Family History  Problem Relation Age of Onset  . Heart disease Mother        After age 82  . AAA (abdominal aortic aneurysm) Sister   . AAA (abdominal aortic aneurysm) Brother   . Heart attack Brother   . Breast cancer Cousin   . Breast cancer Other   . Breast cancer Other     Past Surgical History:  Procedure Laterality Date  . ABDOMINAL HYSTERECTOMY    . BREAST EXCISIONAL  BIOPSY Left   . BREAST EXCISIONAL BIOPSY Left   . BREAST EXCISIONAL BIOPSY Right   . BREAST SURGERY     cyst removal  . CAROTID ENDARTERECTOMY Left 2010  . CHOLECYSTECTOMY    . EYE SURGERY Bilateral 06/2014   cataracts  . JOINT REPLACEMENT     RIGHT KNEE  . REPLACEMENT TOTAL KNEE Right    Social History   Occupational History  . Not on file  Tobacco Use  . Smoking status: Former Smoker    Years: 50.00    Types: Cigarettes    Quit date: 02/04/2002    Years since quitting: 16.4  . Smokeless tobacco: Never Used  Substance and Sexual Activity  . Alcohol use: No  . Drug use: No  . Sexual activity: Not on file

## 2018-07-30 ENCOUNTER — Ambulatory Visit (INDEPENDENT_AMBULATORY_CARE_PROVIDER_SITE_OTHER): Payer: Medicare Other | Admitting: Orthopaedic Surgery

## 2018-07-30 ENCOUNTER — Other Ambulatory Visit: Payer: Self-pay

## 2018-07-30 ENCOUNTER — Ambulatory Visit: Payer: Self-pay

## 2018-07-30 ENCOUNTER — Encounter: Payer: Self-pay | Admitting: Orthopaedic Surgery

## 2018-07-30 DIAGNOSIS — M25531 Pain in right wrist: Secondary | ICD-10-CM | POA: Diagnosis not present

## 2018-07-30 NOTE — Progress Notes (Signed)
Office Visit Note   Patient: Lori PontoCarol S Trouten           Date of Birth: [birth year]           MRN: [id number] Visit Date: 07/30/2018              Requested by: Blair HeysEhinger, Robert, MD [address] [place],  [place] [id number] PCP: Blair HeysEhinger, Robert, MD   Assessment & Plan: Visit Diagnoses:  1. Pain in right wrist     Plan: Impression is questionable right wrist distal radius fracture.  Patient is clinically doing very well and based on imaging believe it is appropriate to place her in a removable splint as needed.  Activity as tolerated.  She will follow-up with us as needed.  Call with concerns or questions in the meantime.  Follow-Up Instructions: Return if symptoms worsen or fail to improve.   Orders:  Orders Placed This Encounter  Procedures   XR Wrist Complete Right   No orders of the defined types were placed in this encounter.     Procedures: No procedures performed   Clinical Data: No additional findings.   Subjective: Chief Complaint  Patient presents with   Right Wrist - Follow-up    HPI patient is a pleasant 84 year old female who presents our clinic today for follow-up of her right wrist.  She was seen in our office last week after having fallen on her right outstretched hand approximately 2 weeks ago.  It was noted on imaging that she may have had a nondisplaced distal radius fracture through the volar rim.  She was placed in a short arm cast and comes in today for follow-up.  She is doing much better.  She has been taking Tylenol for pain when needed.  Review of Systems as detailed in HPI.  All others reviewed and are negative.   Objective: Vital Signs: There were no vitals taken for this visit.  Physical Exam well-developed well-nourished female in no acute distress.  Alert and oriented x3.  Ortho Exam examination of her right wrist reveals very minimal tenderness through the distal radius.  She has near full range of motion in all planes.  No  swelling.  She is neurovascular intact distally.  Specialty Comments:  No specialty comments available.  Imaging: Xr Wrist Complete Right  Result Date: 07/30/2018 Questionable lucency through the distal radius    PMFS History: Patient Active Problem List   Diagnosis Date Noted   Compression fracture of T12 vertebra (HCC) 04/16/2018   Atrial fibrillation (HCC) 03/03/2018   SBO (small bowel obstruction) (HCC) 01/26/2017   Encounter for postoperative carotid endarterectomy surveillance 09/21/2015   Occlusion and stenosis of carotid artery without mention of cerebral infarction 02/05/2012   Past Medical History:  Diagnosis Date   Arthritis    Atrial fibrillation (HCC)    Carotid artery occlusion    Colitis    COPD (chronic obstructive pulmonary disease) (HCC)    Diverticulitis    Hypertension    Keratosis, seborrheic     Family History  Problem Relation Age of Onset   Heart disease Mother        After age 82   AAA (abdominal aortic aneurysm) Sister    AAA (abdominal aortic aneurysm) Brother    Heart attack Brother    Breast cancer Cousin    Breast cancer Other    Breast cancer Other     Past Surgical History:  Procedure Laterality Date   ABDOMINAL HYSTERECTOMY  BREAST EXCISIONAL BIOPSY Left    BREAST EXCISIONAL BIOPSY Left    BREAST EXCISIONAL BIOPSY Right    BREAST SURGERY     cyst removal   CAROTID ENDARTERECTOMY Left 2010   CHOLECYSTECTOMY     EYE SURGERY Bilateral 06/2014   cataracts   JOINT REPLACEMENT     RIGHT KNEE   REPLACEMENT TOTAL KNEE Right    Social History   Occupational History   Not on file  Tobacco Use   Smoking status: Former Smoker    Years: 50.00    Types: Cigarettes    Quit date: 02/04/2002    Years since quitting: 16.4   Smokeless tobacco: Never Used  Substance and Sexual Activity   Alcohol use: No   Drug use: No   Sexual activity: Not on file

## 2018-09-15 ENCOUNTER — Other Ambulatory Visit: Payer: Self-pay | Admitting: Family Medicine

## 2018-09-15 DIAGNOSIS — Z1231 Encounter for screening mammogram for malignant neoplasm of breast: Secondary | ICD-10-CM

## 2018-09-15 DIAGNOSIS — Z7952 Long term (current) use of systemic steroids: Secondary | ICD-10-CM

## 2018-09-15 DIAGNOSIS — Z13820 Encounter for screening for osteoporosis: Secondary | ICD-10-CM

## 2018-09-15 DIAGNOSIS — S22000A Wedge compression fracture of unspecified thoracic vertebra, initial encounter for closed fracture: Secondary | ICD-10-CM

## 2018-10-28 NOTE — Progress Notes (Signed)
HPI: Follow-up atrial fibrillation. Pt admitted January 2019 with small bowel obstruction.  She was noted to be in atrial fibrillation but converted spontaneously to sinus rhythm.  TSH was normal. Echocardiogram January 2019 showed normal LV function, moderate diastolic dysfunction, mild mitral regurgitation and mild to moderate tricuspid regurgitation.  Also with history of carotid endarterectomy.  Carotid Dopplers October 2019 showed 1 to 39% right and greater than 50% in the distal common carotid artery on the left resulting in increased velocity in the internal carotid artery.  She has been treated with anticoagulation. Since last seen, the patient has dyspnea with more extreme activities but not with routine activities. It is relieved with rest. It is not associated with chest pain. There is no orthopnea, PND or pedal edema. There is no syncope or palpitations. There is no exertional chest pain.   Current Outpatient Medications  Medication Sig Dispense Refill  . acetaminophen (TYLENOL) 500 MG tablet Take 1,000 mg by mouth every 6 (six) hours as needed for mild pain, moderate pain, fever or headache.    Marland Kitchen amLODipine (NORVASC) 5 MG tablet Take 5 mg by mouth daily with breakfast.     . aspirin EC 81 MG tablet Take 1 tablet (81 mg total) by mouth daily. 90 tablet 3  . atenolol (TENORMIN) 25 MG tablet Take 25 mg by mouth daily with breakfast.   3  . LORazepam (ATIVAN) 1 MG tablet Take 0.5-1 mg by mouth 2 (two) times daily. Takes 0.5mg  right when she gets up for the day and then a little while later she takes 1mg     . losartan (COZAAR) 100 MG tablet Take 100 mg by mouth daily with breakfast.     . oxybutynin (DITROPAN) 5 MG tablet Take 1 tablet by mouth every morning.    . sertraline (ZOLOFT) 100 MG tablet Take 100 mg by mouth at bedtime.     . sodium chloride (OCEAN) 0.65 % SOLN nasal spray Place 1 spray into both nostrils 2 (two) times daily.     No current facility-administered medications  for this visit.      Past Medical History:  Diagnosis Date  . Arthritis   . Atrial fibrillation ([place])   . Carotid artery occlusion   . Colitis   . COPD (chronic obstructive pulmonary disease) ([place])   . Diverticulitis   . Hypertension   . Keratosis, seborrheic     Past Surgical History:  Procedure Laterality Date  . ABDOMINAL HYSTERECTOMY    . BREAST EXCISIONAL BIOPSY Left   . BREAST EXCISIONAL BIOPSY Left   . BREAST EXCISIONAL BIOPSY Right   . BREAST SURGERY     cyst removal  . CAROTID ENDARTERECTOMY Left 2010  . CHOLECYSTECTOMY    . EYE SURGERY Bilateral 06/2014   cataracts  . JOINT REPLACEMENT     RIGHT KNEE  . REPLACEMENT TOTAL KNEE Right     Social History   Socioeconomic History  . Marital status: Widowed    Spouse name: Not on file  . Number of children: Not on file  . Years of education: Not on file  . Highest education level: Not on file  Occupational History  . Not on file  Social Needs  . Financial resource strain: Not on file  . Food insecurity    Worry: Not on file    Inability: Not on file  . Transportation needs    Medical: Not on file    Non-medical: Not on file  Tobacco Use  . Smoking status: Former Smoker    Years: 50.00    Types: Cigarettes    Quit date: 02/04/2002    Years since quitting: 16.7  . Smokeless tobacco: Never Used  Substance and Sexual Activity  . Alcohol use: No  . Drug use: No  . Sexual activity: Not on file  Lifestyle  . Physical activity    Days per week: Not on file    Minutes per session: Not on file  . Stress: Not on file  Relationships  . Social Musician on phone: Not on file    Gets together: Not on file    Attends religious service: Not on file    Active member of club or organization: Not on file    Attends meetings of clubs or organizations: Not on file    Relationship status: Not on file  . Intimate partner violence    Fear of current or ex partner: Not on file    Emotionally abused:  Not on file    Physically abused: Not on file    Forced sexual activity: Not on file  Other Topics Concern  . Not on file  Social History Narrative  . Not on file    Family History  Problem Relation Age of Onset  . Heart disease Mother        After age 14  . AAA (abdominal aortic aneurysm) Sister   . AAA (abdominal aortic aneurysm) Brother   . Heart attack Brother   . Breast cancer Cousin   . Breast cancer Other   . Breast cancer Other     ROS: no fevers or chills, productive cough, hemoptysis, dysphasia, odynophagia, melena, hematochezia, dysuria, hematuria, rash, seizure activity, orthopnea, PND, pedal edema, claudication. Remaining systems are negative.  Physical Exam: Well-developed well-nourished in no acute distress.  Skin is warm and dry.  HEENT is normal.  Neck is supple.  Chest is clear to auscultation with normal expansion.  Cardiovascular exam is regular rate and rhythm.  Abdominal exam nontender or distended. No masses palpated. Extremities show no edema. neuro grossly intact  ECG-sinus rhythm at a rate of 68, no ST changes.  Personally reviewed  A/P  1 paroxysmal atrial fibrillation-patient remains in sinus rhythm.  Continue beta-blocker for rate control if atrial fibrillation recurs.  She previously requested discontinuation of anticoagulation and understands the higher risk of CVA.  Continue aspirin.  2 hypertension-blood pressure elevated.  Increase amlodipine to 10 mg daily and follow.  3 carotid artery disease-followed by vascular surgery.  Continue aspirin and add statin.  Will begin Crestor 20 mg daily.  Check lipids and liver in 12 weeks.  Olga Millers, MD

## 2018-11-07 ENCOUNTER — Other Ambulatory Visit: Payer: Self-pay

## 2018-11-07 ENCOUNTER — Ambulatory Visit: Payer: Medicare Other | Admitting: Cardiology

## 2018-11-07 ENCOUNTER — Encounter: Payer: Self-pay | Admitting: Cardiology

## 2018-11-07 VITALS — BP 154/86 | HR 68 | Temp 97.5°F | Ht 64.0 in | Wt 155.0 lb

## 2018-11-07 DIAGNOSIS — I679 Cerebrovascular disease, unspecified: Secondary | ICD-10-CM | POA: Diagnosis not present

## 2018-11-07 DIAGNOSIS — I480 Paroxysmal atrial fibrillation: Secondary | ICD-10-CM

## 2018-11-07 DIAGNOSIS — E7800 Pure hypercholesterolemia, unspecified: Secondary | ICD-10-CM | POA: Diagnosis not present

## 2018-11-07 DIAGNOSIS — I10 Essential (primary) hypertension: Secondary | ICD-10-CM | POA: Diagnosis not present

## 2018-11-07 MED ORDER — AMLODIPINE BESYLATE 10 MG PO TABS: 10.0000 mg | ORAL_TABLET | Freq: Every day | ORAL | 3 refills | Status: DC

## 2018-11-07 MED ORDER — ROSUVASTATIN CALCIUM 20 MG PO TABS: 20.0000 mg | ORAL_TABLET | Freq: Every day | ORAL | 3 refills | Status: DC

## 2018-11-07 NOTE — Patient Instructions (Signed)
Medication Instructions:  INCREASE AMLODIPINE TO 10 MG ONCE DAILY= 2 OF THE 5 MG TABLETS ONCE DAILY  START ROSUVASTATIN 20 MG ONCE DAILY  *If you need a refill on your cardiac medications before your next appointment, please call your pharmacy*  Lab Work: Your physician recommends that you return for lab work in: [place]  If you have labs (blood work) drawn today and your tests are completely normal, you will receive your results only by: Marland Kitchen MyChart Message (if you have MyChart) OR . A paper copy in the mail If you have any lab test that is abnormal or we need to change your treatment, we will call you to review the results.  Follow-Up: At [facility], you and your health needs are our priority.  As part of our continuing mission to provide you with exceptional heart care, we have created designated Provider Care Teams.  These Care Teams include your primary Cardiologist (physician) and Advanced Practice Providers (APPs -  Physician Assistants and Nurse Practitioners) who all work together to provide you with the care you need, when you need it.  Your next appointment:   12 months  The format for your next appointment:   In Person  Provider:   Kirk Ruths, MD

## 2018-12-05 ENCOUNTER — Other Ambulatory Visit: Payer: Self-pay

## 2018-12-05 ENCOUNTER — Ambulatory Visit
Admission: RE | Admit: 2018-12-05 | Discharge: 2018-12-05 | Disposition: A | Discharge status: Home / Self Care | Payer: Medicare Other | Source: Ambulatory Visit | Attending: Family Medicine | Admitting: Family Medicine

## 2018-12-05 DIAGNOSIS — Z7952 Long term (current) use of systemic steroids: Secondary | ICD-10-CM

## 2018-12-05 DIAGNOSIS — Z1231 Encounter for screening mammogram for malignant neoplasm of breast: Secondary | ICD-10-CM

## 2018-12-05 DIAGNOSIS — Z13820 Encounter for screening for osteoporosis: Secondary | ICD-10-CM

## 2018-12-05 DIAGNOSIS — S22000A Wedge compression fracture of unspecified thoracic vertebra, initial encounter for closed fracture: Secondary | ICD-10-CM

## 2018-12-06 IMAGING — DX DG ABD PORTABLE 1V
1 series · 1 of 1 positions shown · non-contrast
Comparison: 01/26/2017 CT

CLINICAL DATA: Small-bowel obstruction, 8 hour delayed image.

EXAM:
PORTABLE ABDOMEN - 1 VIEW

[abdomen kub]
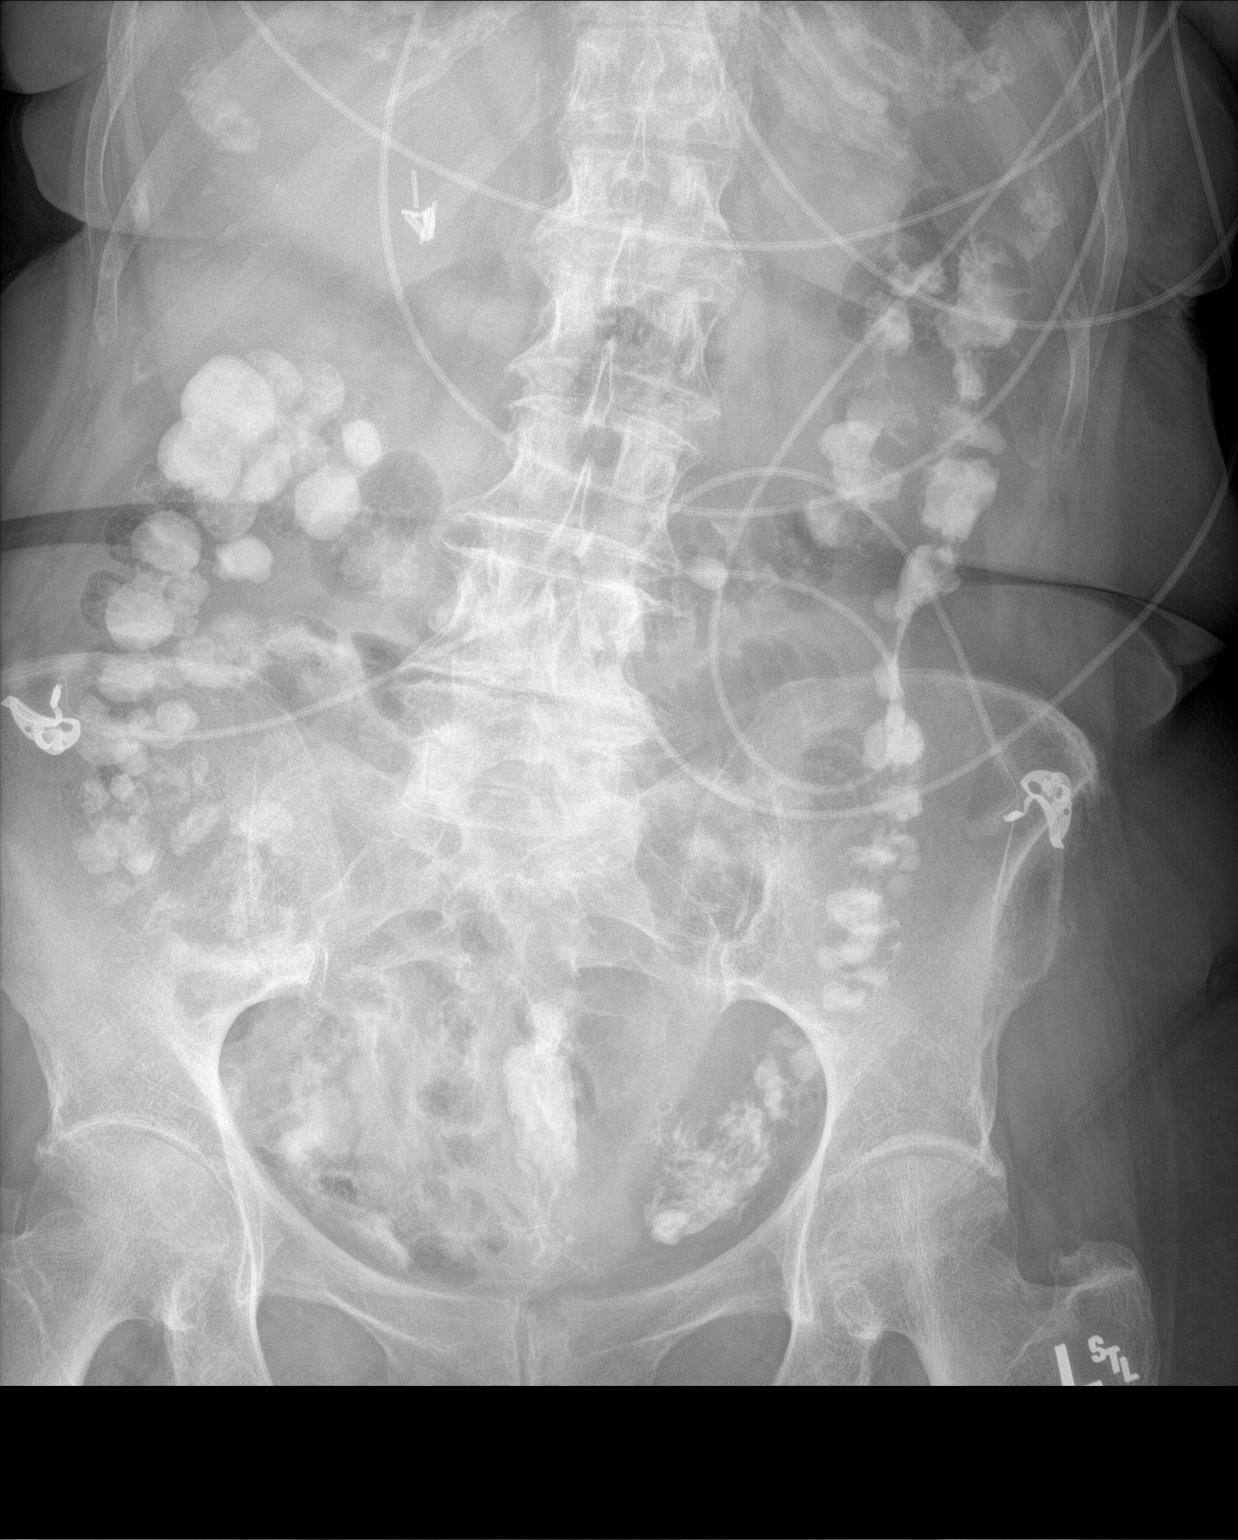

[1 of 1 positions shown; findings below may reference images not displayed]

FINDINGS: Enteric contrast is seen within large bowel to the level of the
rectum with scattered colonic diverticulosis. Mild gaseous
distention of small bowel without contrast is noted centrally.
Findings likely represent a partial SBO. No free air is noted.
Degenerative changes are seen along the dorsal spine and both hips.
Cholecystectomy clips are seen the right upper quadrant.
IMPRESSION: Contrast is noted within large bowel to level of the rectum. Given
mild gaseous distention of small bowel, findings likely represented
a partial SBO.

## 2018-12-23 ENCOUNTER — Other Ambulatory Visit: Payer: Self-pay

## 2018-12-23 DIAGNOSIS — I6523 Occlusion and stenosis of bilateral carotid arteries: Secondary | ICD-10-CM

## 2018-12-24 ENCOUNTER — Ambulatory Visit (HOSPITAL_COMMUNITY)
Admission: RE | Admit: 2018-12-24 | Discharge: 2018-12-24 | Disposition: A | Discharge status: Home / Self Care | Payer: Medicare Other | Source: Ambulatory Visit | Attending: Family | Admitting: Family

## 2018-12-24 ENCOUNTER — Ambulatory Visit (INDEPENDENT_AMBULATORY_CARE_PROVIDER_SITE_OTHER): Payer: Medicare Other | Admitting: Family

## 2018-12-24 ENCOUNTER — Encounter: Payer: Self-pay | Admitting: Family

## 2018-12-24 ENCOUNTER — Other Ambulatory Visit: Payer: Self-pay

## 2018-12-24 VITALS — BP 153/76 | HR 81 | Temp 97.7°F | Resp 12 | Ht 64.0 in | Wt 155.0 lb

## 2018-12-24 DIAGNOSIS — Z9889 Other specified postprocedural states: Secondary | ICD-10-CM | POA: Diagnosis not present

## 2018-12-24 DIAGNOSIS — Z87891 Personal history of nicotine dependence: Secondary | ICD-10-CM

## 2018-12-24 DIAGNOSIS — I6523 Occlusion and stenosis of bilateral carotid arteries: Secondary | ICD-10-CM | POA: Insufficient documentation

## 2018-12-24 NOTE — Progress Notes (Signed)
Chief Complaint: Follow up Extracranial Carotid Artery Stenosis   History of Present Illness  [name] is a 84 y.o. female who is s/p left CEA in 2010 by Dr. Hart Frey.   She denies any known history of stroke or TIA. Specifically she deniesa history of amaurosis fugax or monocular blindness, unilateral facial drooping, hemiplegia, orreceptive or expressive aphasia.    She denies chest pain or headache, states no more dyspnea than usual. She denies claudication type symptoms with walking.  She states her blood pressure at home runs 140's/70's to 80's.    She uses a cane when ambulating, states she has balance issues, unknown reason.   Diabetic: no Tobacco use: former smoker, quit in 2004, smoked x 50 years  Pt meds include: Statin : yes ASA: yes, 81 mg daily Other anticoagulants/antiplatelets: she declined Eliquis, has atrial fib, states her sister bled to death taking coumadin, states Eliquis is very expensive    Past Medical History:  Diagnosis Date  . Arthritis   . Atrial fibrillation (HCC)   . Carotid artery occlusion   . Colitis   . COPD (chronic obstructive pulmonary disease) (HCC)   . Diverticulitis   . Hypertension   . Keratosis, seborrheic     Social History Social History   Tobacco Use  . Smoking status: Former Smoker    Years: 50.00    Types: Cigarettes    Quit date: 02/04/2002    Years since quitting: 16.8  . Smokeless tobacco: Never Used  Substance Use Topics  . Alcohol use: No  . Drug use: No    Family History Family History  Problem Relation Age of Onset  . Heart disease Mother        After age 96  . AAA (abdominal aortic aneurysm) Sister   . AAA (abdominal aortic aneurysm) Brother   . Heart attack Brother   . Breast cancer Cousin   . Breast cancer Other   . Breast cancer Other     Surgical History Past Surgical History:  Procedure Laterality Date  . ABDOMINAL HYSTERECTOMY    . BREAST EXCISIONAL BIOPSY Left   . BREAST  EXCISIONAL BIOPSY Left   . BREAST EXCISIONAL BIOPSY Right   . BREAST SURGERY     cyst removal  . CAROTID ENDARTERECTOMY Left 2010  . CHOLECYSTECTOMY    . EYE SURGERY Bilateral 06/2014   cataracts  . JOINT REPLACEMENT     RIGHT KNEE  . REPLACEMENT TOTAL KNEE Right     Allergies  Allergen Reactions  . Amoxicillin Nausea Only and Rash    Has patient had a PCN reaction causing immediate rash, facial/tongue/throat swelling, SOB or lightheadedness with hypotension: Yes Has patient had a PCN reaction causing severe rash involving mucus membranes or skin necrosis: No Has patient had a PCN reaction that required hospitalization No Has patient had a PCN reaction occurring within the last 10 years: No If all of the above answers are "NO", then may proceed with Cephalosporin use.   . Tetracyclines & Related Nausea Only and Rash    Current Outpatient Medications  Medication Sig Dispense Refill  . acetaminophen (TYLENOL) 500 MG tablet Take 1,000 mg by mouth every 6 (six) hours as needed for mild pain, moderate pain, fever or headache.    Marland Kitchen amLODipine (NORVASC) 10 MG tablet Take 1 tablet (10 mg total) by mouth daily with breakfast. 90 tablet 3  . aspirin EC 81 MG tablet Take 1 tablet (81 mg total) by mouth daily.  90 tablet 3  . atenolol (TENORMIN) 25 MG tablet Take 25 mg by mouth daily with breakfast.   3  . LORazepam (ATIVAN) 1 MG tablet Take 0.5-1 mg by mouth 2 (two) times daily. Takes 0.5mg  right when she gets up for the day and then a little while later she takes 1mg     . losartan (COZAAR) 100 MG tablet Take 100 mg by mouth daily with breakfast.     . oxybutynin (DITROPAN) 5 MG tablet Take 1 tablet by mouth every morning.    . rosuvastatin (CRESTOR) 20 MG tablet Take 1 tablet (20 mg total) by mouth daily. 90 tablet 3  . sertraline (ZOLOFT) 100 MG tablet Take 100 mg by mouth at bedtime.     . sodium chloride (OCEAN) 0.65 % SOLN nasal spray Place 1 spray into both nostrils 2 (two) times daily.      No current facility-administered medications for this visit.     Review of Systems : See HPI for pertinent positives and negatives.  Physical Examination  Vitals:   12/24/18 1347  BP: (!) 153/76  Pulse: 81  Resp: 12  Temp: 97.7 F (36.5 C)  TempSrc: Temporal  SpO2: 100%  Weight: 155 lb (70.3 kg)  Height: 5\' 4"  (1.626 m)   Body mass index is 26.61 kg/m.  General: WDWN elderly female in NAD GAIT: slow, steady, using cane Eyes: Pupils are equal and round HENT: No gross abnormalities.  Pulmonary:  Respirations are non-labored, good air movement in all fields, no rales, rhonchi, or wheezes Cardiac: regular rhythm, no detected murmur.  VASCULAR EXAM Carotid Bruits Right Left   Negative Negative     Abdominal aortic pulse is not palpable. Radial pulses are 2+ palpable and equal.                                                                                                                   Gastrointestinal: soft, nontender, BS WNL, no r/g, no palpable masses. Musculoskeletal: no muscle atrophy/wasting. M/S 5/5 throughout, extremities without ischemic changes. Mild cervical and thoracic kyphosis.  Skin: No rashes, no ulcers, no cellulitis.   Neurologic:  A&O X 3; appropriate affect, sensation is normal; speech is normal, CN 2-12 intact, pain and light touch intact in extremities, motor exam as listed above. Psychiatric: Normal thought content, mood appropriate to clinical situation.    DATA  Carotid Duplex (12-24-18): Right Carotid: Velocities in the right ICA are consistent with a 1-39% stenosis. Left Carotid: Patent endarterectomy site with velocities consistent with a 40-59% stenosis. The left ECA appears >50% stenosed. Vertebrals:  Bilateral vertebral arteries demonstrate antegrade flow. Subclavians: Normal flow hemodynamics were seen in bilateral subclavian arteries.  Carotid Duplex (11-11-17): Right ICA: 1-39% stenosis Left ICA: CEA site with no significant  stenosis, distal CCA with >50% stenosis.  Left ECA stenosis.  Bilateral vertebral artery flow is antegrade.  Bilateral subclavian artery waveforms are normal.  No significant change compared to the exam on 09-25-16.    Assessment: [name]  is a 84 y.o. female who is s/p left CEA in 2010.  She has no history of stroke or TIA.   Carotid duplex today shows 1-39% stenosis of the right ICA and 40-59% stenosis of the left ICA.  Stable stenosis in the right, increased stenosis in the left ICA (CEA site), compared to the exam on 11-11-17.   She is taking a statin.  She takes a daily 81 mg ASA.  She quit smoking in 2004, smoked x 50+ years.  She does not have DM.     Plan: Follow-up in 9 months with Carotid Duplex scan.   I discussed in depth with the patient the nature of atherosclerosis, and emphasized the importance of maximal medical management including strict control of blood pressure, blood glucose, and lipid levels, obtaining regular exercise, and continued cessation of smoking.  The patient is aware that without maximal medical management the underlying atherosclerotic disease process will progress, limiting the benefit of any interventions. The patient was given information about stroke prevention and what symptoms should prompt the patient to seek immediate medical care. Thank you for allowing [place] to participate in this patient's care.  Clemon Chambers, RN, MSN, FNP-C [facility] Office: [phone]  Clinic Physician: Laqueta Due  12/24/18 2:27 PM

## 2018-12-24 NOTE — Patient Instructions (Signed)

## 2019-01-14 ENCOUNTER — Other Ambulatory Visit: Payer: Self-pay | Admitting: *Deleted

## 2019-01-14 DIAGNOSIS — I6523 Occlusion and stenosis of bilateral carotid arteries: Secondary | ICD-10-CM

## 2019-04-28 ENCOUNTER — Ambulatory Visit: Payer: Medicare Other | Admitting: Orthopaedic Surgery

## 2019-04-28 ENCOUNTER — Ambulatory Visit: Payer: Self-pay

## 2019-04-28 ENCOUNTER — Other Ambulatory Visit: Payer: Self-pay

## 2019-04-28 ENCOUNTER — Encounter: Payer: Self-pay | Admitting: Orthopaedic Surgery

## 2019-04-28 DIAGNOSIS — M7671 Peroneal tendinitis, right leg: Secondary | ICD-10-CM

## 2019-04-28 DIAGNOSIS — M19071 Primary osteoarthritis, right ankle and foot: Secondary | ICD-10-CM | POA: Diagnosis not present

## 2019-04-28 MED ORDER — DICLOFENAC SODIUM 1 % EX GEL: 2.0000 g | Freq: Four times a day (QID) | CUTANEOUS | 1 refills | Status: DC

## 2019-04-28 NOTE — Progress Notes (Signed)
Office Visit Note   Patient: [name]           Date of Birth: 01-05-1935           MRN: [id number] Visit Date: 04/28/2019              Requested by: Gaynelle Arabian, MD [address] [place],  [place] [address] PCP: Gaynelle Arabian, MD   Assessment & Plan: Visit Diagnoses:  1. Primary osteoarthritis of right foot   2. Peroneal tendinitis, right leg     Plan: Impression is right foot diffuse osteoarthritis most pronounced at the first MTP joint and right ankle peroneal tendinitis.  I have called in a prescription of Voltaren gel to use as needed.  We will place her in a cam boot weightbearing as tolerated for the next few weeks.  Should she not have any improvement of symptoms over the next 1 to 2 months, she will follow-up with [place].  Otherwise, call with concerns or questions in the meantime.    Follow-Up Instructions: Return if symptoms worsen or fail to improve.   Orders:  Orders Placed This Encounter  Procedures  . XR Foot Complete Right   Meds ordered this encounter  Medications  . diclofenac Sodium (VOLTAREN) 1 % GEL    Sig: Apply 2 g topically 4 (four) times daily.    Dispense:  150 g    Refill:  1      Procedures: No procedures performed   Clinical Data: No additional findings.   Subjective: Chief Complaint  Patient presents with  . Right Foot - Pain    HPI is a pleasant 85 year old female who comes in today with right foot pain.  This began a few months ago and has worsened since.  She does note that her pain is much better today.  There is never specific injury or change in activity.  The majority of her pain is across the first MTP joint as well as to the lateral foot and ankle.  There are no specific aggravators.  She denies any numbness, tingling or burning.  Review of Systems as detailed in HPI.  All others reviewed and are negative.   Objective: Vital Signs: There were no vitals taken for this visit.  Physical Exam  well-developed well-nourished female no acute distress.  Alert and oriented x3.  Ortho Exam examination of her right foot reveals mild to moderate swelling.  She does have moderate tenderness along the peroneal tendon.  Moderate tenderness to the first MTP joint.  Mildly increased pain with dorsiflexion of the great toe.  She has increased pain with eversion, inversion and dorsiflexion of the ankle.  She is neurovascularly intact distally.  Specialty Comments:  No specialty comments available.  Imaging: XR Foot Complete Right  Result Date: 04/28/2019 Diffuse osteoarthritis most pronounced at the 1st TMT joint.    PMFS History: Patient Active Problem List   Diagnosis Date Noted  . Compression fracture of T12 vertebra ([place]) 04/16/2018  . Atrial fibrillation ([place]) 03/03/2018  . SBO (small bowel obstruction) ([place]) 01/26/2017  . Encounter for postoperative carotid endarterectomy surveillance 09/21/2015  . Occlusion and stenosis of carotid artery without mention of cerebral infarction 02/05/2012   Past Medical History:  Diagnosis Date  . Arthritis   . Atrial fibrillation ([place])   . Carotid artery occlusion   . Colitis   . COPD (chronic obstructive pulmonary disease) ([place])   . Diverticulitis   . Hypertension   . Keratosis,  seborrheic     Family History  Problem Relation Age of Onset  . Heart disease Mother        After age 32  . AAA (abdominal aortic aneurysm) Sister   . AAA (abdominal aortic aneurysm) Brother   . Heart attack Brother   . Breast cancer Cousin   . Breast cancer Other   . Breast cancer Other     Past Surgical History:  Procedure Laterality Date  . ABDOMINAL HYSTERECTOMY    . BREAST EXCISIONAL BIOPSY Left   . BREAST EXCISIONAL BIOPSY Left   . BREAST EXCISIONAL BIOPSY Right   . BREAST SURGERY     cyst removal  . CAROTID ENDARTERECTOMY Left 2010  . CHOLECYSTECTOMY    . EYE SURGERY Bilateral 06/2014   cataracts  . JOINT REPLACEMENT     RIGHT KNEE  .  REPLACEMENT TOTAL KNEE Right    Social History   Occupational History  . Not on file  Tobacco Use  . Smoking status: Former Smoker    Years: 50.00    Types: Cigarettes    Quit date: 02/04/2002    Years since quitting: 17.2  . Smokeless tobacco: Never Used  Substance and Sexual Activity  . Alcohol use: No  . Drug use: No  . Sexual activity: Not on file

## 2019-09-03 ENCOUNTER — Encounter (INDEPENDENT_AMBULATORY_CARE_PROVIDER_SITE_OTHER): Payer: Self-pay | Admitting: Otolaryngology

## 2019-09-03 ENCOUNTER — Other Ambulatory Visit: Payer: Self-pay

## 2019-09-03 ENCOUNTER — Ambulatory Visit (INDEPENDENT_AMBULATORY_CARE_PROVIDER_SITE_OTHER): Payer: Medicare Other | Admitting: Otolaryngology

## 2019-09-03 VITALS — Temp 97.3°F

## 2019-09-03 DIAGNOSIS — H6983 Other specified disorders of Eustachian tube, bilateral: Secondary | ICD-10-CM

## 2019-09-03 DIAGNOSIS — J310 Chronic rhinitis: Secondary | ICD-10-CM

## 2019-09-03 DIAGNOSIS — H6121 Impacted cerumen, right ear: Secondary | ICD-10-CM

## 2019-09-03 NOTE — Progress Notes (Signed)
HPI: [name] is a 85 y.o. female who presents is referred by hearing life, Adriana Mccallum for evaluation of mild conductive hearing loss on recent audiologic testing..  Patient complains of chronic postnasal drainage and uses saline irrigation but does not use any antihistamines or nasal steroid sprays.  Past Medical History:  Diagnosis Date  . Arthritis   . Atrial fibrillation (HCC)   . Carotid artery occlusion   . Colitis   . COPD (chronic obstructive pulmonary disease) (HCC)   . Diverticulitis   . Hypertension   . Keratosis, seborrheic    Past Surgical History:  Procedure Laterality Date  . ABDOMINAL HYSTERECTOMY    . BREAST EXCISIONAL BIOPSY Left   . BREAST EXCISIONAL BIOPSY Left   . BREAST EXCISIONAL BIOPSY Right   . BREAST SURGERY     cyst removal  . CAROTID ENDARTERECTOMY Left 2010  . CHOLECYSTECTOMY    . EYE SURGERY Bilateral 06/2014   cataracts  . JOINT REPLACEMENT     RIGHT KNEE  . REPLACEMENT TOTAL KNEE Right    Social History   Socioeconomic History  . Marital status: Widowed    Spouse name: Not on file  . Number of children: Not on file  . Years of education: Not on file  . Highest education level: Not on file  Occupational History  . Not on file  Tobacco Use  . Smoking status: Former Smoker    Packs/day: 1.50    Years: 50.00    Pack years: 75.00    Types: Cigarettes    Quit date: 02/04/2002    Years since quitting: 17.5  . Smokeless tobacco: Never Used  Vaping Use  . Vaping Use: Never used  Substance and Sexual Activity  . Alcohol use: No  . Drug use: No  . Sexual activity: Not on file  Other Topics Concern  . Not on file  Social History Narrative  . Not on file   Social Determinants of Health   Financial Resource Strain:   . Difficulty of Paying Living Expenses:   Food Insecurity:   . Worried About Programme researcher, broadcasting/film/video in the Last Year:   . Barista in the Last Year:   Transportation Needs:   . Freight forwarder  (Medical):   Marland Kitchen Lack of Transportation (Non-Medical):   Physical Activity:   . Days of Exercise per Week:   . Minutes of Exercise per Session:   Stress:   . Feeling of Stress :   Social Connections:   . Frequency of Communication with Friends and Family:   . Frequency of Social Gatherings with Friends and Family:   . Attends Religious Services:   . Active Member of Clubs or Organizations:   . Attends Banker Meetings:   Marland Kitchen Marital Status:    Family History  Problem Relation Age of Onset  . Heart disease Mother        After age 16  . AAA (abdominal aortic aneurysm) Sister   . AAA (abdominal aortic aneurysm) Brother   . Heart attack Brother   . Breast cancer Cousin   . Breast cancer Other   . Breast cancer Other    Allergies  Allergen Reactions  . Amoxicillin Nausea Only and Rash    Has patient had a PCN reaction causing immediate rash, facial/tongue/throat swelling, SOB or lightheadedness with hypotension: Yes Has patient had a PCN reaction causing severe rash involving mucus membranes or skin necrosis: No Has patient had  a PCN reaction that required hospitalization No Has patient had a PCN reaction occurring within the last 10 years: No If all of the above answers are "NO", then may proceed with Cephalosporin use.   . Tetracyclines & Related Nausea Only and Rash   Prior to Admission medications   Medication Sig Start Date End Date Taking? Authorizing Provider  acetaminophen (TYLENOL) 500 MG tablet Take 1,000 mg by mouth every 6 (six) hours as needed for mild pain, moderate pain, fever or headache.   Yes [provider]  amLODipine (NORVASC) 10 MG tablet Take 1 tablet (10 mg total) by mouth daily with breakfast. 11/07/18  Yes Crenshaw, Madolyn Frieze, MD  aspirin EC 81 MG tablet Take 1 tablet (81 mg total) by mouth daily. 03/24/18  Yes Lewayne Bunting, MD  atenolol (TENORMIN) 25 MG tablet Take 25 mg by mouth daily with breakfast.  07/27/14  Yes [provider]  diclofenac Sodium (VOLTAREN) 1 % GEL Apply 2 g topically 4 (four) times daily. 04/28/19  Yes Cristie Hem, PA-C  LORazepam (ATIVAN) 1 MG tablet Take 0.5-1 mg by mouth 2 (two) times daily. Takes 0.5mg  right when she gets up for the day and then a little while later she takes 1mg    Yes [provider]  losartan (COZAAR) 100 MG tablet Take 100 mg by mouth daily with breakfast.    Yes [provider]  oxybutynin (DITROPAN) 5 MG tablet Take 1 tablet by mouth every morning. 06/10/17  Yes [provider]  sertraline (ZOLOFT) 100 MG tablet Take 100 mg by mouth at bedtime.    Yes [provider]  sodium chloride (OCEAN) 0.65 % SOLN nasal spray Place 1 spray into both nostrils 2 (two) times daily.   Yes [provider]  rosuvastatin (CRESTOR) 20 MG tablet Take 1 tablet (20 mg total) by mouth daily. 11/07/18 02/05/19  02/07/19, MD     Positive ROS: Otherwise negative  All other systems have been reviewed and were otherwise negative with the exception of those mentioned in the HPI and as above.  Physical Exam: Constitutional: Alert, well-appearing, no acute distress Ears: External ears without lesions or tenderness.  Right TM in the ear canal is partially occluded with cerumen that was removed with suction.  The right TM was otherwise clear with reasonable mobility on pneumatic otoscopy.  Left TM had minimal wax buildup in the left TM is also clear although slightly retracted but no middle ear effusion noted. Nasal: External nose without lesions. Septum deviated to the left with mild rhinitis.  Both middle meatus regions are clear with no signs of infection.. Oral: Lips and gums without lesions. Tongue and palate mucosa without lesions. Posterior oropharynx clear. Neck: No palpable adenopathy or masses Respiratory: Breathing comfortably  Skin: No facial/neck lesions or rash noted.  Cerumen impaction removal  Date/Time: 09/03/2019 4:02  PM Performed by: 11/03/2019, MD Authorized by: Drema Halon, MD   Consent:    Consent obtained:  Verbal   Consent given by:  Patient   Risks discussed:  Pain and bleeding Procedure details:    Location:  R ear   Procedure type: suction   Post-procedure details:    Inspection:  TM intact and canal normal   Hearing quality:  Improved   Patient tolerance of procedure:  Tolerated well, no immediate complications Comments:     TMs were clear otherwise with no middle ear effusion noted.    Assessment: Right ear  wax buildup. Bilateral sensorineural hearing loss Eustachian tube dysfunction with chronic rhinitis and mild conductive loss.  Plan: We recommend proceeding with hearing aids. I also prescribed Flonase 2 sprays each nostril at night as well as use of Claritin Allegra or Zyrtec which should help with chronic rhinitis and eustachian tube dysfunction. She will follow-up as needed.   Narda Bonds, MD   CC:

## 2019-09-04 ENCOUNTER — Encounter (INDEPENDENT_AMBULATORY_CARE_PROVIDER_SITE_OTHER): Payer: Self-pay

## 2019-09-25 ENCOUNTER — Other Ambulatory Visit: Payer: Self-pay | Admitting: Cardiology

## 2019-09-25 DIAGNOSIS — E7800 Pure hypercholesterolemia, unspecified: Secondary | ICD-10-CM

## 2019-10-19 ENCOUNTER — Other Ambulatory Visit: Payer: Self-pay

## 2019-10-19 DIAGNOSIS — I6523 Occlusion and stenosis of bilateral carotid arteries: Secondary | ICD-10-CM

## 2019-10-28 ENCOUNTER — Other Ambulatory Visit: Payer: Self-pay

## 2019-10-28 ENCOUNTER — Ambulatory Visit (HOSPITAL_COMMUNITY)
Admission: RE | Admit: 2019-10-28 | Discharge: 2019-10-28 | Disposition: A | Discharge status: Home / Self Care | Payer: Medicare Other | Source: Ambulatory Visit | Attending: Vascular Surgery | Admitting: Vascular Surgery

## 2019-10-28 ENCOUNTER — Ambulatory Visit: Payer: Medicare Other | Admitting: Physician Assistant

## 2019-10-28 VITALS — BP 159/73 | HR 58 | Temp 97.8°F | Resp 20 | Ht 64.0 in | Wt 153.5 lb

## 2019-10-28 DIAGNOSIS — I6523 Occlusion and stenosis of bilateral carotid arteries: Secondary | ICD-10-CM

## 2019-10-28 NOTE — Progress Notes (Addendum)
Office Note     CC:  follow up Requesting Provider:  Blair Heys, MD  HPI: [name] is a 85 y.o. ([birth year]) female who presents for routine follow-up of bilateral carotid artery stenosis.  She is status post left carotid endarterectomy by Dr. Hart Rochester in 2010.  This was for asymptomatic disease.  She denies amaurosis fugax, unilateral face drooping, hemiplegia, slurred speech.  Denies lower extremity pain with exercise or rest pain.  Diabetic:no Tobacco KNL:ZJQBHA smoker, quitin 2004, smoked x 50years  Pt meds include: Statin :yes ASA:yes, 81 mg daily  Past Medical History:  Diagnosis Date  . Arthritis   . Atrial fibrillation (HCC)   . Carotid artery occlusion   . Colitis   . COPD (chronic obstructive pulmonary disease) (HCC)   . Diverticulitis   . Hypertension   . Keratosis, seborrheic     Past Surgical History:  Procedure Laterality Date  . ABDOMINAL HYSTERECTOMY    . BREAST EXCISIONAL BIOPSY Left   . BREAST EXCISIONAL BIOPSY Left   . BREAST EXCISIONAL BIOPSY Right   . BREAST SURGERY     cyst removal  . CAROTID ENDARTERECTOMY Left 2010  . CHOLECYSTECTOMY    . EYE SURGERY Bilateral 06/2014   cataracts  . JOINT REPLACEMENT     RIGHT KNEE  . REPLACEMENT TOTAL KNEE Right     Social History   Socioeconomic History  . Marital status: Widowed    Spouse name: Not on file  . Number of children: Not on file  . Years of education: Not on file  . Highest education level: Not on file  Occupational History  . Not on file  Tobacco Use  . Smoking status: Former Smoker    Packs/day: 1.50    Years: 50.00    Pack years: 75.00    Types: Cigarettes    Quit date: 02/04/2002    Years since quitting: 17.7  . Smokeless tobacco: Never Used  Vaping Use  . Vaping Use: Never used  Substance and Sexual Activity  . Alcohol use: No  . Drug use: No  . Sexual activity: Not on file  Other Topics Concern  . Not on file  Social History Narrative  . Not on file    Social Determinants of Health   Financial Resource Strain:   . Difficulty of Paying Living Expenses: Not on file  Food Insecurity:   . Worried About Programme researcher, broadcasting/film/video in the Last Year: Not on file  . Ran Out of Food in the Last Year: Not on file  Transportation Needs:   . Lack of Transportation (Medical): Not on file  . Lack of Transportation (Non-Medical): Not on file  Physical Activity:   . Days of Exercise per Week: Not on file  . Minutes of Exercise per Session: Not on file  Stress:   . Feeling of Stress : Not on file  Social Connections:   . Frequency of Communication with Friends and Family: Not on file  . Frequency of Social Gatherings with Friends and Family: Not on file  . Attends Religious Services: Not on file  . Active Member of Clubs or Organizations: Not on file  . Attends Banker Meetings: Not on file  . Marital Status: Not on file  Intimate Partner Violence:   . Fear of Current or Ex-Partner: Not on file  . Emotionally Abused: Not on file  . Physically Abused: Not on file  . Sexually Abused: Not on file   Family History  Problem Relation Age of Onset  . Heart disease Mother        After age 83  . AAA (abdominal aortic aneurysm) Sister   . AAA (abdominal aortic aneurysm) Brother   . Heart attack Brother   . Breast cancer Cousin   . Breast cancer Other   . Breast cancer Other     Current Outpatient Medications  Medication Sig Dispense Refill  . acetaminophen (TYLENOL) 500 MG tablet Take 1,000 mg by mouth every 6 (six) hours as needed for mild pain, moderate pain, fever or headache.    Marland Kitchen amLODipine (NORVASC) 5 MG tablet Take 5 mg by mouth daily.    Marland Kitchen aspirin EC 81 MG tablet Take 1 tablet (81 mg total) by mouth daily. 90 tablet 3  . atenolol (TENORMIN) 25 MG tablet Take 25 mg by mouth daily with breakfast.   3  . diclofenac Sodium (VOLTAREN) 1 % GEL Apply 2 g topically 4 (four) times daily. 150 g 1  . fluticasone (FLONASE) 50 MCG/ACT nasal  spray Place 1 spray into both nostrils at bedtime.    Marland Kitchen LORazepam (ATIVAN) 1 MG tablet Take 0.5-1 mg by mouth 2 (two) times daily. Takes 0.5mg  right when she gets up for the day and then a little while later she takes 1mg     . losartan (COZAAR) 100 MG tablet Take 100 mg by mouth daily with breakfast.     . omeprazole (PRILOSEC) 40 MG capsule Take 40 mg by mouth daily.    oxybutynin (DITROPAN) 5 MG tablet Take 1 tablet by mouth every morning.    . rosuvastatin (CRESTOR) 20 MG tablet TAKE 1 TABLET BY MOUTH  DAILY 90 tablet 1  . sertraline (ZOLOFT) 100 MG tablet Take 100 mg by mouth at bedtime.     . sodium chloride (OCEAN) 0.65 % SOLN nasal spray Place 1 spray into both nostrils 2 (two) times daily.     No current facility-administered medications for this visit.    Allergies  Allergen Reactions  . Amoxicillin Nausea Only and Rash    Has patient had a PCN reaction causing immediate rash, facial/tongue/throat swelling, SOB or lightheadedness with hypotension: Yes Has patient had a PCN reaction causing severe rash involving mucus membranes or skin necrosis: No Has patient had a PCN reaction that required hospitalization No Has patient had a PCN reaction occurring within the last 10 years: No If all of the above answers are "NO", then may proceed with Cephalosporin use.   . Tetracyclines & Related Nausea Only and Rash     REVIEW OF SYSTEMS:   [X]  denotes positive finding, [ ]  denotes negative finding Cardiac  Comments:  Chest pain or chest pressure:    Shortness of breath upon exertion:    Short of breath when lying flat:    Irregular heart rhythm:        Vascular    Pain in calf, thigh, or hip brought on by ambulation:    Pain in feet at night that wakes you up from your sleep:     Blood clot in your veins:    Leg swelling:         Pulmonary    Oxygen at home:    Productive cough:     Wheezing:         Neurologic    Sudden weakness in arms or legs:     Sudden numbness in  arms or legs:     Sudden onset of difficulty speaking  or slurred speech:    Temporary loss of vision in one eye:     Problems with dizziness:         Gastrointestinal    Blood in stool:     Vomited blood:         Genitourinary    Burning when urinating:     Blood in urine:        Psychiatric    Major depression:         Hematologic    Bleeding problems:    Problems with blood clotting too easily:        Skin    Rashes or ulcers:        Constitutional    Fever or chills:      PHYSICAL EXAMINATION:  Vitals:   10/28/19 1349 10/28/19 1352  BP: (!) 156/72 (!) 159/73  Pulse: (!) 58   Resp: 20   Temp: 97.8 F (36.6 C)   TempSrc: Temporal   SpO2: 96%   Weight: 153 lb 8 oz (69.6 kg)   Height: 5\' 4"  (1.626 m)     General:  WDWN in NAD; vital signs documented above Gait: Ambulates with cane, no ataxia HENT: WNL, normocephalic Pulmonary: normal non-labored breathing , without Rales, rhonchi,  wheezing Cardiac: regular HR, without  Murmurs with carotid bruits Abdomen: soft, NT, no masses Skin: without rashes Vascular Exam/Pulses: 2+ brachial, radial pulses bilaterally Extremities: without ischemic changes, without Gangrene , without cellulitis; without open wounds; feet are warm and well perfused.  Weakly palpable bilateral DP pulses. Musculoskeletal: no muscle wasting or atrophy  Neurologic: A&O X 3;  No focal weakness or paresthesias are detected Psychiatric:  The pt has Normal affect.   Non-Invasive Vascular Imaging:   Right Carotid: Velocities in the right ICA are consistent with a 1-39%  stenosis.   Left Carotid: Velocities in the left CCA are consistent with a 40-59%  stenosis. The ECA appears >50% stenosed.   Vertebrals: Bilateral vertebral arteries demonstrate antegrade flow.  Subclavian arteries: Bilateral multiphasic with normal velocities.  ASSESSMENT/PLAN:: 85 y.o. female here for follow up for bilateral carotid artery stenoses.  Estimated right  proximal ICA stenosis 1 to 39% and left proximal ICA stenosis 1 to 39%.  Stable as compared to December 2020.  The patient is asymptomatic.  She is status post left CEA in 2010.  She is compliant with medications.  She continues to avoid smoking. We reviewed signs and symptoms of stroke/TIA and encouraged her to speak immediate medical attention should these occur. Follow-up in 1 year with bilateral carotid artery duplex scan.  2011, PA-C [facility] [phone]  Clinic MD:   [phone]

## 2020-02-02 IMAGING — CT CT L SPINE W/O CM
3 of 5 series · 12 of 34 positions shown, 14 images · non-contrast
Comparison: Lumbar spine radiograph 03/26/2018

CT abdomen pelvis 01/26/2017

CLINICAL DATA: Fall with low back pain

EXAM:
CT LUMBAR SPINE WITHOUT CONTRAST
TECHNIQUE: Multidetector CT imaging of the lumbar spine was performed without
intravenous contrast administration. Multiplanar CT image
reconstructions were also generated.

[Series 5: l spine soft · axial · 0.28mm/px · z∈[+752,+968]mm · 6 of 139 slices shown, 8 images]
[im 22/139  soft-tissue]
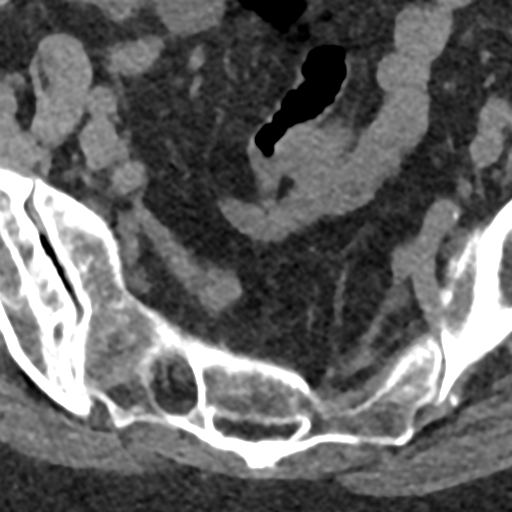
[im 22/139  bone]
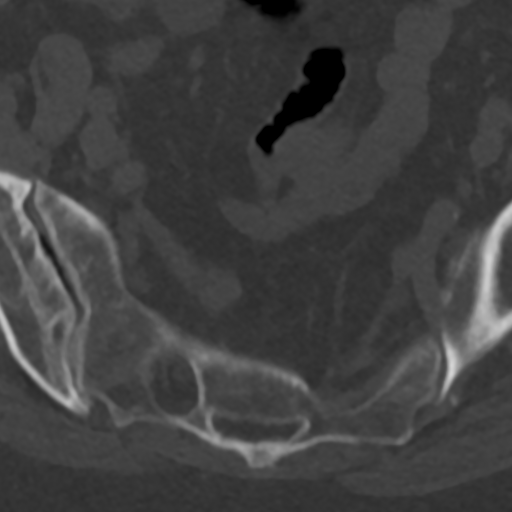
[im 43/139  bone]
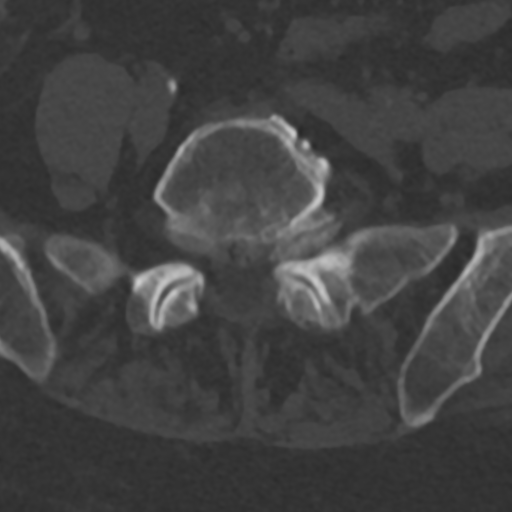
[im 64/139  bone]
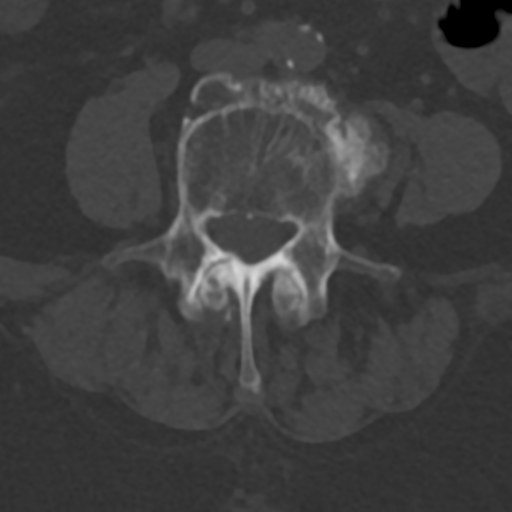
[im 85/139  bone]
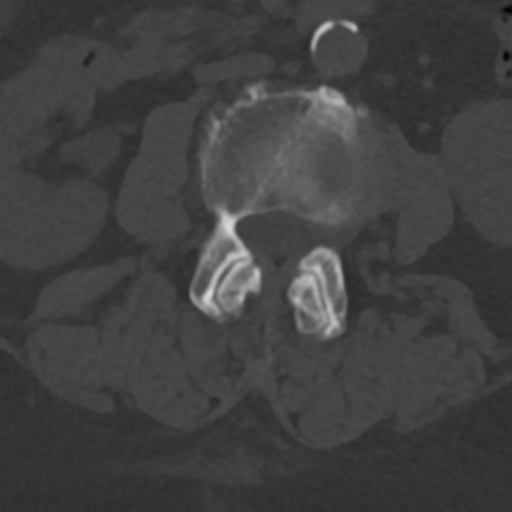
[im 107/139  soft-tissue]
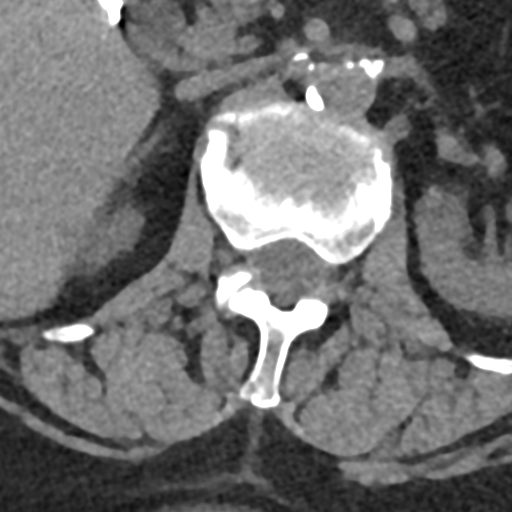
[im 107/139  bone]
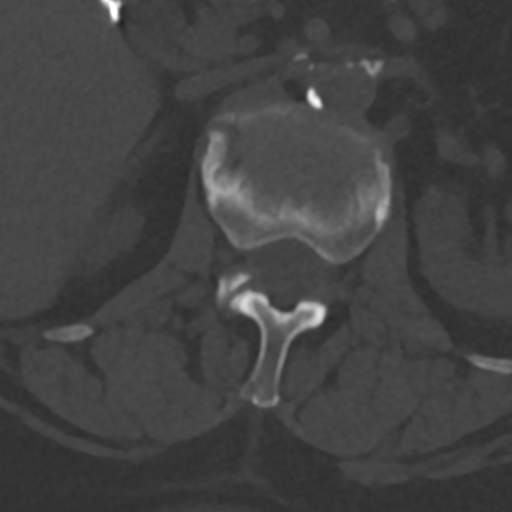
[im 128/139  bone]
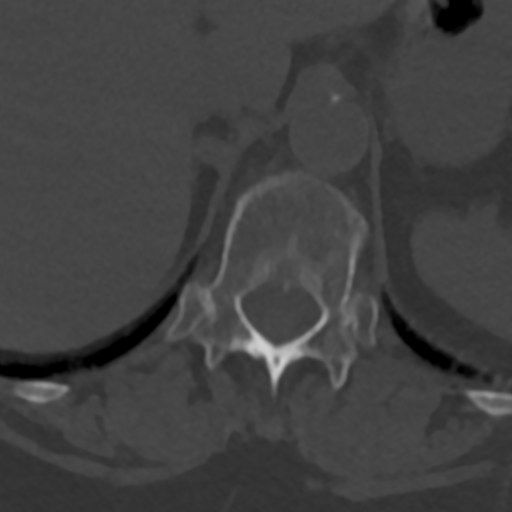

[Series 7: cor bone · coronal · 0.31mm/px · 1 of 79 slices shown]
[im 40/79  bone]
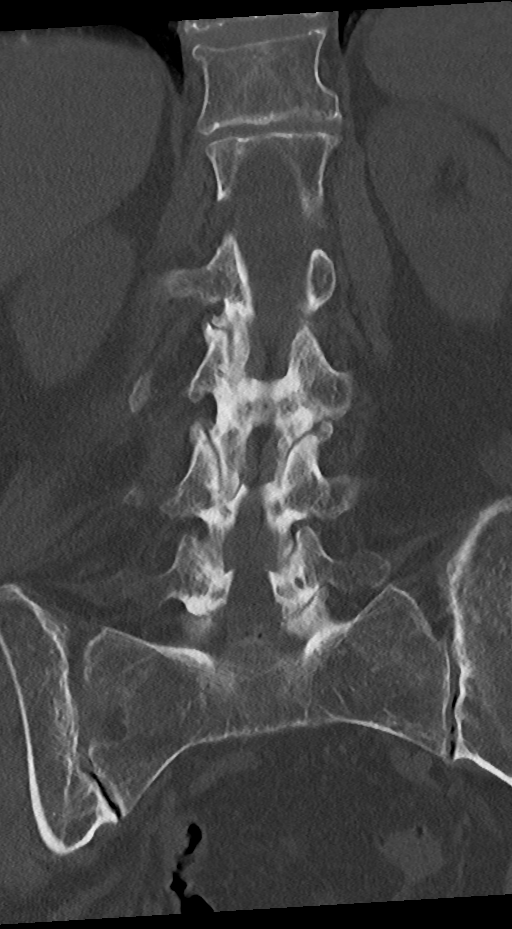

[Series 9: sag st · sagittal · 0.29mm/px · 5 of 80 slices shown]
[im 14/80  bone]
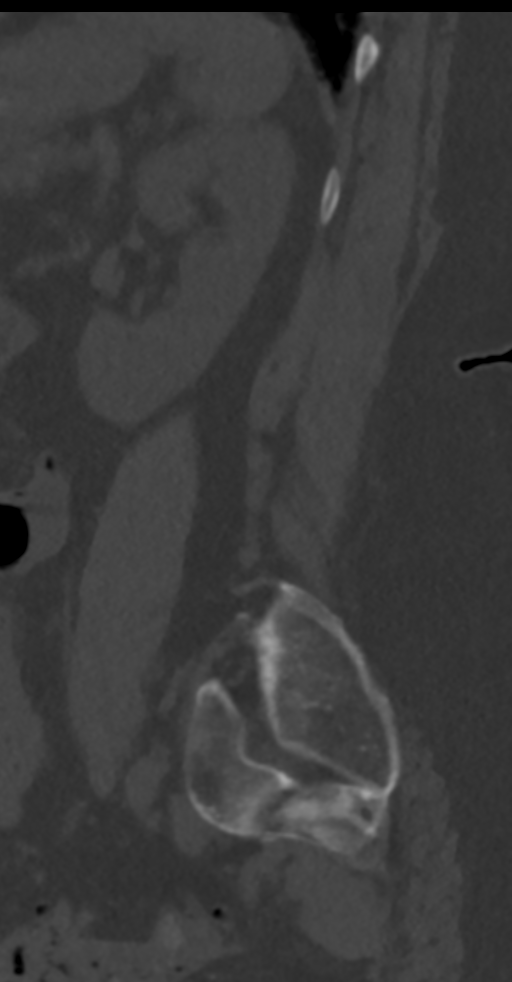
[im 27/80  bone]
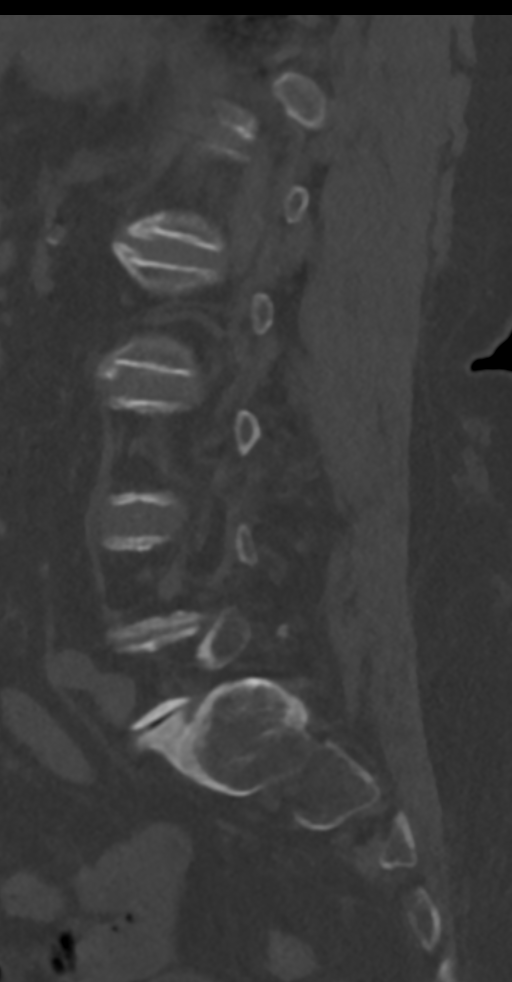
[im 40/80  bone]
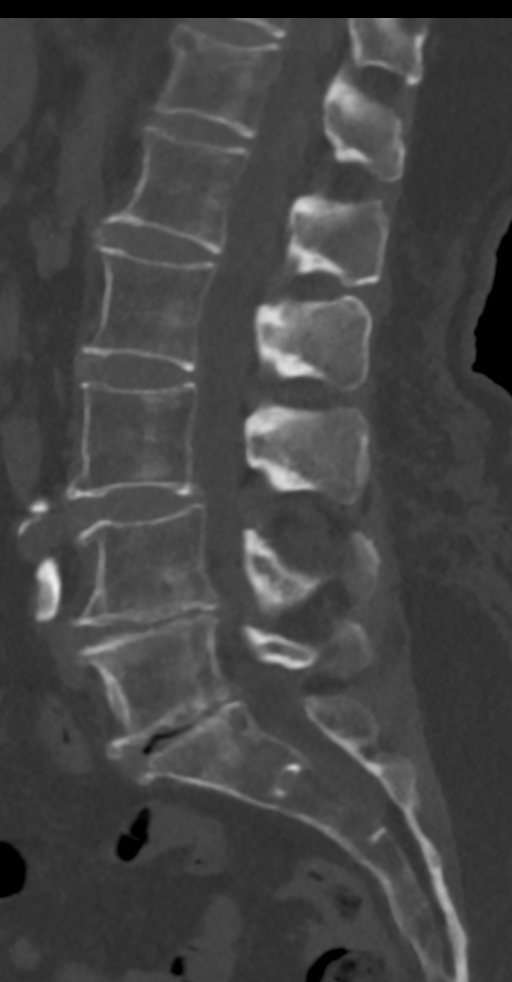
[im 53/80  bone]
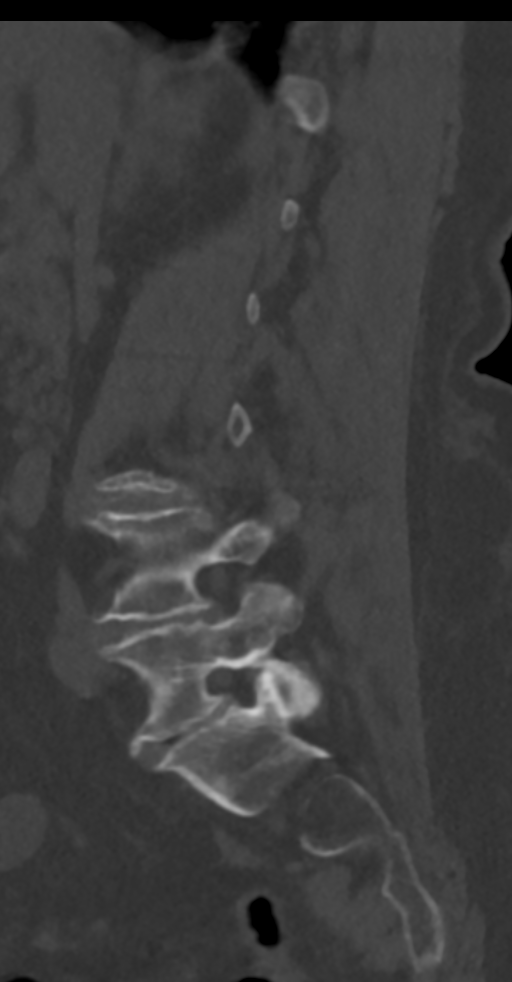
[im 66/80  bone]
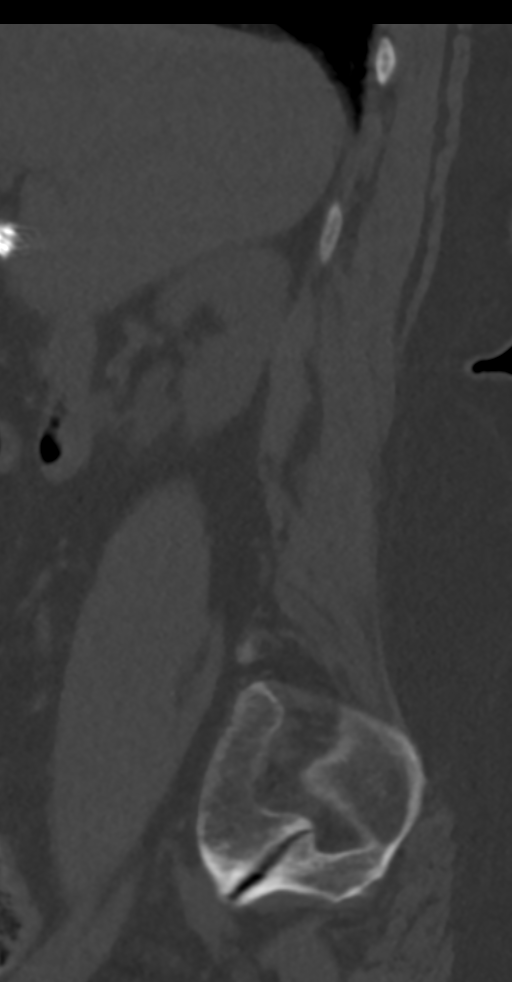

[12 of 34 positions shown; findings below may reference images not displayed]

FINDINGS: Segmentation: 5 lumbar type vertebrae.

Alignment: Normal.

Vertebrae: There is an acute wedge compression fracture of T12
involving the superior endplate and anterior wall with approximately
10% height loss. No retropulsion. There is no other acute fracture.
The L4 abnormality questioned on the radiograph corresponds to a
sclerotic band extending through the left superior corner of the
vertebral body.

Paraspinal and other soft tissues: There is calcific aortic
atherosclerosis. There is rectosigmoid diverticulosis.

Disc levels: There is moderate L4-5 spinal canal stenosis. There is
moderate-to-severe facet hypertrophy at the L2-3, L3-4 and L5-S1
levels. There is moderate left L4 and severe left L5 neural
foraminal stenosis.
IMPRESSION: 1. Acute wedge compression fracture of T12 involving the superior
endplate and anterior wall with approximately 10% height loss. No
retropulsion.
2. Moderate L4-5 spinal canal stenosis.
3. Severe left L5 and moderate left L4 neural foraminal stenosis.

## 2020-02-09 NOTE — Progress Notes (Signed)
HPI: Follow-up atrial fibrillation.Ptadmitted January 2019 with small bowel obstruction. She was noted to be in atrial fibrillation but converted spontaneously to sinus rhythm. TSH was normal. Echocardiogram January 2019 showed normal LV function, moderate diastolic dysfunction, mild mitral regurgitation and mild to moderate tricuspid regurgitation. Also with history of carotid endarterectomy. Carotid Dopplers October 2021 showed 1 to 39% right and 40-59% left internal carotid artery. Anticoagulation discontinued previously at her request.  Since last seen,she denies dyspnea, chest pain, palpitations or syncope.  Current Outpatient Medications  Medication Sig Dispense Refill  . acetaminophen (TYLENOL) 500 MG tablet Take 1,000 mg by mouth every 6 (six) hours as needed for mild pain, moderate pain, fever or headache.    Marland Kitchen acetaminophen (TYLENOL) 650 MG CR tablet 2 tablet    . amLODipine (NORVASC) 5 MG tablet Take 5 mg by mouth daily.    Marland Kitchen aspirin EC 81 MG tablet Take 1 tablet (81 mg total) by mouth daily. 90 tablet 3  . atenolol (TENORMIN) 25 MG tablet Take 25 mg by mouth daily with breakfast.   3  . calcium carbonate (OSCAL) 1500 (600 Ca) MG TABS tablet 1 tablet with meals    . Cholecalciferol (VITAMIN D) 50 MCG (2000 UT) tablet 1 tablet    . diclofenac Sodium (VOLTAREN) 1 % GEL Apply 2 g topically 4 (four) times daily. 150 g 1  . LORazepam (ATIVAN) 1 MG tablet Take 0.5-1 mg by mouth 2 (two) times daily. Takes 0.5mg  right when she gets up for the day and then a little while later she takes 1mg     . losartan (COZAAR) 100 MG tablet Take 100 mg by mouth daily with breakfast.     . omeprazole (PRILOSEC) 40 MG capsule Take 40 mg by mouth daily.    oxybutynin (DITROPAN) 5 MG tablet Take 1 tablet by mouth every morning.    . rosuvastatin (CRESTOR) 20 MG tablet TAKE 1 TABLET BY MOUTH  DAILY 90 tablet 1  . sertraline (ZOLOFT) 100 MG tablet Take 100 mg by mouth at bedtime.     . sodium  chloride (OCEAN) 0.65 % SOLN nasal spray Place 1 spray into both nostrils 2 (two) times daily.     No current facility-administered medications for this visit.     Past Medical History:  Diagnosis Date  . Arthritis   . Atrial fibrillation (HCC)   . Carotid artery occlusion   . Colitis   . COPD (chronic obstructive pulmonary disease) (HCC)   . Diverticulitis   . Hypertension   . Keratosis, seborrheic     Past Surgical History:  Procedure Laterality Date  . ABDOMINAL HYSTERECTOMY    . BREAST EXCISIONAL BIOPSY Left   . BREAST EXCISIONAL BIOPSY Left   . BREAST EXCISIONAL BIOPSY Right   . BREAST SURGERY     cyst removal  . CAROTID ENDARTERECTOMY Left 2010  . CHOLECYSTECTOMY    . EYE SURGERY Bilateral 06/2014   cataracts  . JOINT REPLACEMENT     RIGHT KNEE  . REPLACEMENT TOTAL KNEE Right     Social History   Socioeconomic History  . Marital status: Widowed    Spouse name: Not on file  . Number of children: Not on file  . Years of education: Not on file  . Highest education level: Not on file  Occupational History  . Not on file  Tobacco Use  . Smoking status: Former Smoker    Packs/day: 1.50    Years: 50.00  Pack years: 75.00    Types: Cigarettes    Quit date: 02/04/2002    Years since quitting: 18.0  . Smokeless tobacco: Never Used  Vaping Use  . Vaping Use: Never used  Substance and Sexual Activity  . Alcohol use: No  . Drug use: No  . Sexual activity: Not on file  Other Topics Concern  . Not on file  Social History Narrative  . Not on file   Social Determinants of Health   Financial Resource Strain: Not on file  Food Insecurity: Not on file  Transportation Needs: Not on file  Physical Activity: Not on file  Stress: Not on file  Social Connections: Not on file  Intimate Partner Violence: Not on file    Family History  Problem Relation Age of Onset  . Heart disease Mother        After age 24  . AAA (abdominal aortic aneurysm) Sister   . AAA  (abdominal aortic aneurysm) Brother   . Heart attack Brother   . Breast cancer Cousin   . Breast cancer Other   . Breast cancer Other     ROS: no fevers or chills, productive cough, hemoptysis, dysphasia, odynophagia, melena, hematochezia, dysuria, hematuria, rash, seizure activity, orthopnea, PND, pedal edema, claudication. Remaining systems are negative.  Physical Exam: Well-developed well-nourished in no acute distress.  Skin is warm and dry.  HEENT is normal.  Neck is supple.  Left carotid bruit Chest is clear to auscultation with normal expansion.  Cardiovascular exam is regular rate and rhythm.  Abdominal exam nontender or distended. No masses palpated. Extremities show no edema. neuro grossly intact  ECG-sinus bradycardia at a rate of 52, no ST changes.  Personally reviewed  A/P  1 paroxysmal atrial fibrillation-patient remains in sinus rhythm.  We will continue beta-blocker at present dose.  Anticoagulation discontinued previously at her request and she understands the higher risk of CVA.  Continue aspirin.  2 carotid artery disease-continue aspirin and statin.  Recheck carotid Dopplers October 2022.  3 hypertension-blood pressure elevated; increase amlodipine to 10 mg daily and follow.  Olga Millers, MD

## 2020-02-14 DIAGNOSIS — M1990 Unspecified osteoarthritis, unspecified site: Secondary | ICD-10-CM | POA: Diagnosis not present

## 2020-02-14 DIAGNOSIS — E781 Pure hyperglyceridemia: Secondary | ICD-10-CM | POA: Diagnosis not present

## 2020-02-14 DIAGNOSIS — N183 Chronic kidney disease, stage 3 unspecified: Secondary | ICD-10-CM | POA: Diagnosis not present

## 2020-02-14 DIAGNOSIS — M8580 Other specified disorders of bone density and structure, unspecified site: Secondary | ICD-10-CM | POA: Diagnosis not present

## 2020-02-14 DIAGNOSIS — I480 Paroxysmal atrial fibrillation: Secondary | ICD-10-CM | POA: Diagnosis not present

## 2020-02-14 DIAGNOSIS — I129 Hypertensive chronic kidney disease with stage 1 through stage 4 chronic kidney disease, or unspecified chronic kidney disease: Secondary | ICD-10-CM | POA: Diagnosis not present

## 2020-02-14 DIAGNOSIS — K219 Gastro-esophageal reflux disease without esophagitis: Secondary | ICD-10-CM | POA: Diagnosis not present

## 2020-02-15 ENCOUNTER — Encounter: Payer: Self-pay | Admitting: Cardiology

## 2020-02-15 ENCOUNTER — Ambulatory Visit: Payer: Medicare Other | Admitting: Cardiology

## 2020-02-15 ENCOUNTER — Other Ambulatory Visit: Payer: Self-pay

## 2020-02-15 VITALS — BP 153/71 | HR 52 | Ht 64.5 in | Wt 151.4 lb

## 2020-02-15 DIAGNOSIS — I6523 Occlusion and stenosis of bilateral carotid arteries: Secondary | ICD-10-CM | POA: Diagnosis not present

## 2020-02-15 DIAGNOSIS — I480 Paroxysmal atrial fibrillation: Secondary | ICD-10-CM | POA: Diagnosis not present

## 2020-02-15 DIAGNOSIS — I10 Essential (primary) hypertension: Secondary | ICD-10-CM | POA: Diagnosis not present

## 2020-02-15 MED ORDER — AMLODIPINE BESYLATE 10 MG PO TABS: 10.0000 mg | ORAL_TABLET | Freq: Every day | ORAL | 3 refills | Status: DC

## 2020-02-15 NOTE — Patient Instructions (Signed)
Medication Instructions:   INCREASE AMLODIPINE TO 10 MG ONCE DAILY= 2 OF THE 5 MG TABLETS ONCE DAILY  *If you need a refill on your cardiac medications before your next appointment, please call your pharmacy*  Follow-Up: At [facility], you and your health needs are our priority.  As part of our continuing mission to provide you with exceptional heart care, we have created designated Provider Care Teams.  These Care Teams include your primary Cardiologist (physician) and Advanced Practice Providers (APPs -  Physician Assistants and Nurse Practitioners) who all work together to provide you with the care you need, when you need it.  We recommend signing up for the patient portal called "MyChart".  Sign up information is provided on this After Visit Summary.  MyChart is used to connect with patients for Virtual Visits (Telemedicine).  Patients are able to view lab/test results, encounter notes, upcoming appointments, etc.  Non-urgent messages can be sent to your provider as well.   To learn more about what you can do with MyChart, go to [email or web address].    Your next appointment:   12 month(s)  The format for your next appointment:   In Person  Provider:   Brian Crenshaw, MD    

## 2020-03-07 ENCOUNTER — Other Ambulatory Visit: Payer: Self-pay | Admitting: Cardiology

## 2020-03-07 DIAGNOSIS — E7800 Pure hypercholesterolemia, unspecified: Secondary | ICD-10-CM

## 2020-03-11 DIAGNOSIS — N183 Chronic kidney disease, stage 3 unspecified: Secondary | ICD-10-CM | POA: Diagnosis not present

## 2020-03-11 DIAGNOSIS — K219 Gastro-esophageal reflux disease without esophagitis: Secondary | ICD-10-CM | POA: Diagnosis not present

## 2020-03-11 DIAGNOSIS — M1990 Unspecified osteoarthritis, unspecified site: Secondary | ICD-10-CM | POA: Diagnosis not present

## 2020-03-11 DIAGNOSIS — I129 Hypertensive chronic kidney disease with stage 1 through stage 4 chronic kidney disease, or unspecified chronic kidney disease: Secondary | ICD-10-CM | POA: Diagnosis not present

## 2020-03-11 DIAGNOSIS — M8580 Other specified disorders of bone density and structure, unspecified site: Secondary | ICD-10-CM | POA: Diagnosis not present

## 2020-03-11 DIAGNOSIS — E781 Pure hyperglyceridemia: Secondary | ICD-10-CM | POA: Diagnosis not present

## 2020-03-11 DIAGNOSIS — I480 Paroxysmal atrial fibrillation: Secondary | ICD-10-CM | POA: Diagnosis not present

## 2020-04-29 DIAGNOSIS — I129 Hypertensive chronic kidney disease with stage 1 through stage 4 chronic kidney disease, or unspecified chronic kidney disease: Secondary | ICD-10-CM | POA: Diagnosis not present

## 2020-04-29 DIAGNOSIS — K219 Gastro-esophageal reflux disease without esophagitis: Secondary | ICD-10-CM | POA: Diagnosis not present

## 2020-04-29 DIAGNOSIS — M8580 Other specified disorders of bone density and structure, unspecified site: Secondary | ICD-10-CM | POA: Diagnosis not present

## 2020-04-29 DIAGNOSIS — I480 Paroxysmal atrial fibrillation: Secondary | ICD-10-CM | POA: Diagnosis not present

## 2020-04-29 DIAGNOSIS — E781 Pure hyperglyceridemia: Secondary | ICD-10-CM | POA: Diagnosis not present

## 2020-04-29 DIAGNOSIS — M1990 Unspecified osteoarthritis, unspecified site: Secondary | ICD-10-CM | POA: Diagnosis not present

## 2020-04-29 DIAGNOSIS — N183 Chronic kidney disease, stage 3 unspecified: Secondary | ICD-10-CM | POA: Diagnosis not present

## 2020-08-01 DIAGNOSIS — K219 Gastro-esophageal reflux disease without esophagitis: Secondary | ICD-10-CM | POA: Diagnosis not present

## 2020-08-01 DIAGNOSIS — M1990 Unspecified osteoarthritis, unspecified site: Secondary | ICD-10-CM | POA: Diagnosis not present

## 2020-08-01 DIAGNOSIS — N183 Chronic kidney disease, stage 3 unspecified: Secondary | ICD-10-CM | POA: Diagnosis not present

## 2020-08-01 DIAGNOSIS — M8580 Other specified disorders of bone density and structure, unspecified site: Secondary | ICD-10-CM | POA: Diagnosis not present

## 2020-08-01 DIAGNOSIS — I129 Hypertensive chronic kidney disease with stage 1 through stage 4 chronic kidney disease, or unspecified chronic kidney disease: Secondary | ICD-10-CM | POA: Diagnosis not present

## 2020-08-01 DIAGNOSIS — I480 Paroxysmal atrial fibrillation: Secondary | ICD-10-CM | POA: Diagnosis not present

## 2020-08-01 DIAGNOSIS — E781 Pure hyperglyceridemia: Secondary | ICD-10-CM | POA: Diagnosis not present

## 2020-08-02 DIAGNOSIS — Z20822 Contact with and (suspected) exposure to covid-19: Secondary | ICD-10-CM | POA: Diagnosis not present

## 2020-08-05 DIAGNOSIS — Z7189 Other specified counseling: Secondary | ICD-10-CM | POA: Diagnosis not present

## 2020-08-05 DIAGNOSIS — M791 Myalgia, unspecified site: Secondary | ICD-10-CM | POA: Diagnosis not present

## 2020-08-05 DIAGNOSIS — N183 Chronic kidney disease, stage 3 unspecified: Secondary | ICD-10-CM | POA: Diagnosis not present

## 2020-08-05 DIAGNOSIS — U071 COVID-19: Secondary | ICD-10-CM | POA: Diagnosis not present

## 2020-08-09 DIAGNOSIS — R051 Acute cough: Secondary | ICD-10-CM | POA: Diagnosis not present

## 2020-08-09 DIAGNOSIS — R5383 Other fatigue: Secondary | ICD-10-CM | POA: Diagnosis not present

## 2020-08-09 DIAGNOSIS — R059 Cough, unspecified: Secondary | ICD-10-CM | POA: Diagnosis not present

## 2020-08-09 DIAGNOSIS — Z03818 Encounter for observation for suspected exposure to other biological agents ruled out: Secondary | ICD-10-CM | POA: Diagnosis not present

## 2020-09-21 ENCOUNTER — Ambulatory Visit (INDEPENDENT_AMBULATORY_CARE_PROVIDER_SITE_OTHER): Payer: Medicare Other | Admitting: Orthopaedic Surgery

## 2020-09-21 ENCOUNTER — Ambulatory Visit: Payer: Self-pay

## 2020-09-21 ENCOUNTER — Encounter: Payer: Self-pay | Admitting: Orthopaedic Surgery

## 2020-09-21 DIAGNOSIS — M47812 Spondylosis without myelopathy or radiculopathy, cervical region: Secondary | ICD-10-CM | POA: Insufficient documentation

## 2020-09-21 DIAGNOSIS — M542 Cervicalgia: Secondary | ICD-10-CM | POA: Diagnosis not present

## 2020-09-21 NOTE — Progress Notes (Signed)
Office Visit Note   Patient: [name]           Date of Birth: June 26, 1934           MRN: [id number] Visit Date: 09/21/2020              Requested by: Blair Heys, MD [address] [place],  [place] [address] PCP: Blair Heys, MD   Assessment & Plan: Visit Diagnoses:  1. Neck pain   2. Spondylosis of cervical region without myelopathy or radiculopathy     Plan: [name] has diffuse and advanced osteoarthritis of the cervical spine associated with loss of motion and posterior cervical musculature spasm.  She does not have any radiculopathy.  Have discussed use of Tylenol heat and we will try a course of physical therapy at [facility].  Like to see her back in the next 3 to 4 weeks if no improvement.  Follow-Up Instructions: Return if symptoms worsen or fail to improve.   Orders:  Orders Placed This Encounter  Procedures   XR Cervical Spine 2 or 3 views   Ambulatory referral to Physical Therapy   No orders of the defined types were placed in this encounter.     Procedures: No procedures performed   Clinical Data: No additional findings.   Subjective: Chief Complaint  Patient presents with   Neck - Pain  Patient presents today for neck pain. She said that it has been hurting for 5 days. No known injury. She said that the pain occurs with certain positions, but not sure what positions those are. She has pain that radiates into her left shoulder. Occasionally she experiences pain in her left arm. She cannot turn her head to the left. She has taken tylenol for pain relief. No previous c-spine surgery.   HPI  Review of Systems   Objective: Vital Signs: There were no vitals taken for this visit.  Physical Exam Constitutional:      Appearance: She is well-developed.  Pulmonary:     Effort: Pulmonary effort is normal.  Skin:    General: Skin is warm and dry.  Neurological:     Mental Status: She is alert and oriented to person, place, and  time.  Psychiatric:        Behavior: Behavior normal.    Ortho Exam awake and alert.  Little hard of hearing.  No acute distress.  Able to touch her chin to her chest and only has about 50% of normal neck extension with some pain referred to the posterior aspect of her neck and some to the posterior aspect of her left shoulder.  50% of normal rotation to the right into the left.  Neurologically intact.  Did not experience any pain into either upper extremity with motion of her neck  Specialty Comments:  No specialty comments available.  Imaging: XR Cervical Spine 2 or 3 views  Result Date: 09/21/2020 Films of the cervical spine were obtained in 2 projections.  There is diffuse degenerative change throughout the cervical spine particularly in the midportion where there is narrowing of the disc spaces, peripheral osteophytes and disc facet sclerosis with slight straightening of the normal cervical lordosis.  Minimal listhesis of C6 on 7.  Diffuse decreased bone mineralization.  No acute changes    PMFS History: Patient Active Problem List   Diagnosis Date Noted   Osteoarthritis cervical spine 09/21/2020   Compression fracture of T12 vertebra (HCC) 04/16/2018   Atrial fibrillation (HCC)  03/03/2018   SBO (small bowel obstruction) (HCC) 01/26/2017   Encounter for postoperative carotid endarterectomy surveillance 09/21/2015   Occlusion and stenosis of carotid artery without mention of cerebral infarction 02/05/2012   Past Medical History:  Diagnosis Date   Arthritis    Atrial fibrillation (HCC)    Carotid artery occlusion    Colitis    COPD (chronic obstructive pulmonary disease) (HCC)    Diverticulitis    Hypertension    Keratosis, seborrheic     Family History  Problem Relation Age of Onset   Heart disease Mother        After age 66   AAA (abdominal aortic aneurysm) Sister    AAA (abdominal aortic aneurysm) Brother    Heart attack Brother    Breast cancer Cousin    Breast  cancer Other    Breast cancer Other     Past Surgical History:  Procedure Laterality Date   ABDOMINAL HYSTERECTOMY     BREAST EXCISIONAL BIOPSY Left    BREAST EXCISIONAL BIOPSY Left    BREAST EXCISIONAL BIOPSY Right    BREAST SURGERY     cyst removal   CAROTID ENDARTERECTOMY Left 2010   CHOLECYSTECTOMY     EYE SURGERY Bilateral 06/2014   cataracts   JOINT REPLACEMENT     RIGHT KNEE   REPLACEMENT TOTAL KNEE Right    Social History   Occupational History   Not on file  Tobacco Use   Smoking status: Former    Packs/day: 1.50    Years: 50.00    Pack years: 75.00    Types: Cigarettes    Quit date: 02/04/2002    Years since quitting: 18.6   Smokeless tobacco: Never  Vaping Use   Vaping Use: Never used  Substance and Sexual Activity   Alcohol use: No   Drug use: No   Sexual activity: Not on file

## 2020-10-10 ENCOUNTER — Ambulatory Visit (HOSPITAL_COMMUNITY): Payer: Medicare Other | Attending: Orthopaedic Surgery | Admitting: Physical Therapy

## 2020-10-10 ENCOUNTER — Other Ambulatory Visit: Payer: Self-pay

## 2020-10-10 ENCOUNTER — Encounter (HOSPITAL_COMMUNITY): Payer: Self-pay | Admitting: Physical Therapy

## 2020-10-10 DIAGNOSIS — G8929 Other chronic pain: Secondary | ICD-10-CM | POA: Diagnosis not present

## 2020-10-10 DIAGNOSIS — M6281 Muscle weakness (generalized): Secondary | ICD-10-CM | POA: Insufficient documentation

## 2020-10-10 DIAGNOSIS — M25512 Pain in left shoulder: Secondary | ICD-10-CM | POA: Insufficient documentation

## 2020-10-10 DIAGNOSIS — M542 Cervicalgia: Secondary | ICD-10-CM | POA: Insufficient documentation

## 2020-10-10 NOTE — Therapy (Signed)
[facility] [address] [place], [place], [address] Phone: [phone]   Fax:  [phone]  Physical Therapy Evaluation  Patient Details  Name: [name] MRN: [id number] Date of Birth: Mar 12, 1934 Referring Provider (PT): Valeria Batman, MD   Encounter Date: 10/10/2020   PT End of Session - 10/10/20 1057     Visit Number 1    Number of Visits 8    Date for PT Re-Evaluation 12/05/20    Authorization Type UHC Medicare No CL or auth required    Progress Note Due on Visit 10    PT Start Time 1105    PT Stop Time 1145    PT Time Calculation (min) 40 min    Activity Tolerance Patient tolerated treatment well    Behavior During Therapy WFL for tasks assessed/performed             Past Medical History:  Diagnosis Date   Arthritis    Atrial fibrillation (HCC)    Carotid artery occlusion    Colitis    COPD (chronic obstructive pulmonary disease) (HCC)    Diverticulitis    Hypertension    Keratosis, seborrheic     Past Surgical History:  Procedure Laterality Date   ABDOMINAL HYSTERECTOMY     BREAST EXCISIONAL BIOPSY Left    BREAST EXCISIONAL BIOPSY Left    BREAST EXCISIONAL BIOPSY Right    BREAST SURGERY     cyst removal   CAROTID ENDARTERECTOMY Left 2010   CHOLECYSTECTOMY     EYE SURGERY Bilateral 06/2014   cataracts   JOINT REPLACEMENT     RIGHT KNEE   REPLACEMENT TOTAL KNEE Right     There were no vitals filed for this visit.    Subjective Assessment - 10/10/20 1110     Subjective States that she went to her orthopedic MD as she was having a problem in her neck and shoulders. States she has arthritis and that she needs therapy. Reports she has never had therapy before. Reports her pain started a few weeks ago and is off and od. Reports it comes and goes. Reports no current pain but states that some movements (rotation) increase her pain. States that her arm also hurts. Most pain is on the left side with  symptoms mostly staying in shoulder with one time it went into her left hand.    Pertinent History hx of falls, HOH, hx of carotid artery surgery > 5 years ago on left side    Limitations Lifting;House hold activities    Currently in Pain? No/denies   none currently               [facility] PT Assessment - 10/10/20 0001       Assessment   Medical Diagnosis neck pain    Referring Provider (PT) Valeria Batman, MD    Hand Dominance Right    Prior Therapy no      Balance Screen   Has the patient fallen in the past 6 months Yes    How many times? 1   daily almost falls   Has the patient had a decrease in activity level because of a fear of falling?  Yes    Is the patient reluctant to leave their home because of a fear of falling?  No      Home Environment   Living Environment Private residence    Living Arrangements Alone    Available Help at Discharge Family  Type of Home House    Home Access Level entry    Home Layout Laundry or work area in basement;Able to live on main level with [employer] - single point;Walker - 2 wheels      Prior Function   Level of Independence Independent with household mobility with device;Independent with community mobility with device      Cognition   Overall Cognitive Status Within Functional Limits for tasks assessed      Observation/Other Assessments   Focus on Therapeutic Outcomes (FOTO)  48% function      Posture/Postural Control   Posture/Postural Control Postural limitations    Postural Limitations Rounded Shoulders;Forward head;Increased thoracic kyphosis   severe forward head     ROM / Strength   AROM / PROM / Strength AROM;Strength      AROM   AROM Assessment Site Cervical;Shoulder    Right/Left Shoulder Left;Right    Right Shoulder Flexion 95 Degrees   uncomfortable on tops of shoulders   Right Shoulder ABduction 120 Degrees   uncomfortable on tops of shoulders   Left Shoulder Flexion 95 Degrees    uncomfortable on tops of shoulders   Left Shoulder ABduction 120 Degrees   uncomfortable on tops of shoulders   Cervical Flexion 14   uncomfortable in neck   Cervical Extension 38   uncomfortable in neck   Cervical - Right Side Bend 8   pain on left side   Cervical - Left Side Bend 5   pain on left side and right side   Cervical - Right Rotation 25   pain on left side   Cervical - Left Rotation 18   pain on left side     Strength   Strength Assessment Site Shoulder      Palpation   Spinal mobility hypomobility noted throughout thoracic spine    Palpation comment tenderness to palpation throughout left shoulder region and cervical musculature L >R      Balance   Balance Assessed Yes      Static Standing Balance   Static Standing - Balance Support No upper extremity supported    Static Standing - Level of Assistance 5: Stand by assistance    Static Standing Balance -  Activities  Single Leg Stance - Left Leg;Single Leg Stance - Right Leg    Static Standing - Comment/# of Minutes unable on either leg                        Objective measurements completed on examination: See above findings.       [facility] Adult PT Treatment/Exercise - 10/10/20 0001       Exercises   Exercises Neck      Neck Exercises: Seated   Neck Retraction 10 reps   PT physical assist   Other Seated Exercise seated posture tall exercise x5 10" hlds with towel/pillow support and feet supported    Other Seated Exercise shoulder external rotation x10 5" holds                     PT Education - 10/10/20 1127     Education Details on posture, on current presentation, on HEP and FOTO score.    Person(s) Educated Patient    Methods Explanation    Comprehension Verbalized understanding              PT Short Term Goals - 10/10/20 1114  PT SHORT TERM GOAL #1   Title Patient wil be able to sit in active seated posture for 45 seconds (with towel/pillow/foot support) if  needed to demonstrate improved active posture    Time 2    Period Weeks    Status New    Target Date 10/24/20      PT SHORT TERM GOAL #2   Title Patient will report at least 25% improvement in overall symptoms and/or function to demonstrate improved functional mobility    Time 2    Period Weeks    Status New    Target Date 10/24/20      PT SHORT TERM GOAL #3   Title Patient will be independent in self management strategies to improve quality of life and functional outcomes.    Time 2    Period Weeks    Status New    Target Date 10/24/20               PT Long Term Goals - 10/10/20 1115       PT LONG TERM GOAL #1   Title Patient will report at least 50% improvement in overall symptoms and/or function to demonstrate improved functional mobility    Time 4    Period Weeks    Status New    Target Date 11/07/20      PT LONG TERM GOAL #2   Title Patient will be able to stand on either leg in SLS for at leat 5 seconds with no UE support to demonstrate improved static balance    Time 4    Period Weeks    Status New    Target Date 11/07/20      PT LONG TERM GOAL #3   Title Patient will meet predicted FOTO score to demonstrate improved overall function.    Time 4    Period Weeks    Status Revised    Target Date 11/07/20                    Plan - 10/10/20 1143     Clinical Impression Statement Patient is a 86 y.o. female who presents to physical therapy with complaint of neck pain. Patient demonstrates decreased strength, ROM restriction, reduced flexibility, increased tenderness to palpation and postural abnormalities which are likely contributing to symptoms of pain and are negatively impacting patient ability to perform ADLs. Patient also presents with impaired balance during balance screen and would benefit from adding HEP exercises to include for balance training at home. Patient will benefit from skilled physical therapy services to address these deficits to  reduce pain and improve level of function with ADLs    Personal Factors and Comorbidities Comorbidity 1    Comorbidities heart conditions    Examination-Activity Limitations Sit;Stand;Lift;Transfers    Examination-Participation Restrictions Cleaning    Stability/Clinical Decision Making Stable/Uncomplicated    Clinical Decision Making Low    Rehab Potential Fair    PT Frequency 2x / week    PT Duration 4 weeks    PT Treatment/Interventions ADLs/Self Care Home Management;Biofeedback;Electrical Stimulation;Cryotherapy;Moist Heat;Therapeutic exercise;Traction;Therapeutic activities;Manual techniques;DME Instruction;Neuromuscular re-education;Patient/family education;Dry needling;Passive range of motion;Gait training    PT Next Visit Plan posture exercises, core strengthening, a couple static balance exercises for HEP    PT Home Exercise Plan seated posture, shoulder ER with good posture    Consulted and Agree with Plan of Care Patient             Patient will benefit from skilled therapeutic  intervention in order to improve the following deficits and impairments:  Pain, Difficulty walking, Decreased endurance, Decreased strength, Decreased activity tolerance, Decreased balance, Decreased range of motion, Postural dysfunction  Visit Diagnosis: Cervicalgia  Muscle weakness (generalized)  Chronic left shoulder pain     Problem List Patient Active Problem List   Diagnosis Date Noted   Osteoarthritis cervical spine 09/21/2020   Compression fracture of T12 vertebra (HCC) 04/16/2018   Atrial fibrillation (HCC) 03/03/2018   SBO (small bowel obstruction) (HCC) 01/26/2017   Encounter for postoperative carotid endarterectomy surveillance 09/21/2015   Occlusion and stenosis of carotid artery without mention of cerebral infarction 02/05/2012   11:47 AM, 10/10/20 Tereasa Coop, DPT Physical Therapy with [facility]  [phone] office   [facility] [address] [place], [place], [address] Phone: [phone]   Fax:  [phone]  Name: [name] MRN: [id number] Date of Birth: Jul 21, 1934

## 2020-10-14 ENCOUNTER — Encounter (HOSPITAL_COMMUNITY): Payer: Self-pay

## 2020-10-14 ENCOUNTER — Other Ambulatory Visit: Payer: Self-pay

## 2020-10-14 ENCOUNTER — Ambulatory Visit (HOSPITAL_COMMUNITY): Payer: Medicare Other

## 2020-10-14 DIAGNOSIS — G8929 Other chronic pain: Secondary | ICD-10-CM

## 2020-10-14 DIAGNOSIS — M6281 Muscle weakness (generalized): Secondary | ICD-10-CM

## 2020-10-14 DIAGNOSIS — M542 Cervicalgia: Secondary | ICD-10-CM

## 2020-10-14 DIAGNOSIS — M25512 Pain in left shoulder: Secondary | ICD-10-CM | POA: Diagnosis not present

## 2020-10-14 NOTE — Therapy (Signed)
[facility] [address] [place], [place], [address] Phone: [phone]   Fax:  [phone]  Physical Therapy Treatment  Patient Details  Name: [name] MRN: [id number] Date of Birth: 10-Oct-1934 Referring Provider (PT): Valeria Batman, MD   Encounter Date: 10/14/2020   PT End of Session - 10/14/20 1024     Visit Number 2    Number of Visits 8    Date for PT Re-Evaluation 12/05/20    Authorization Type UHC Medicare No CL or auth required    Progress Note Due on Visit 10    PT Start Time 1015   late arrival   PT Stop Time 1042    PT Time Calculation (min) 27 min    Activity Tolerance Patient tolerated treatment well    Behavior During Therapy WFL for tasks assessed/performed             Past Medical History:  Diagnosis Date   Arthritis    Atrial fibrillation (HCC)    Carotid artery occlusion    Colitis    COPD (chronic obstructive pulmonary disease) (HCC)    Diverticulitis    Hypertension    Keratosis, seborrheic     Past Surgical History:  Procedure Laterality Date   ABDOMINAL HYSTERECTOMY     BREAST EXCISIONAL BIOPSY Left    BREAST EXCISIONAL BIOPSY Left    BREAST EXCISIONAL BIOPSY Right    BREAST SURGERY     cyst removal   CAROTID ENDARTERECTOMY Left 2010   CHOLECYSTECTOMY     EYE SURGERY Bilateral 06/2014   cataracts   JOINT REPLACEMENT     RIGHT KNEE   REPLACEMENT TOTAL KNEE Right     There were no vitals filed for this visit.   Subjective Assessment - 10/14/20 1017     Subjective Pt late for apt, stated she had issues with her debit card trying to get gas earlier.  Reports she feels okay today, had difficulty sleeping last night.    Pertinent History hx of falls, HOH, hx of carotid artery surgery > 5 years ago on left side    Currently in Pain? No/denies                               [facility] Adult PT Treatment/Exercise - 10/14/20 0001       Posture/Postural Control    Posture/Postural Control Postural limitations    Postural Limitations Forward head;Rounded Shoulders;Increased thoracic kyphosis      Exercises   Exercises Neck      Neck Exercises: Standing   Other Standing Exercises Standing tall; marching with HHA 10x 3" alternating      Neck Exercises: Seated   Neck Retraction 10 reps;3 secs    Neck Retraction Limitations tactile and verbal cueing    W Back 10 reps    W Back Limitations 3" holds    Postural Training Educated importance of proper posture; seated posture tall exercisex 1 min holds with towel/pillow support and feet supported    Other Seated Exercise shoulder external rotation x10 5" holds                     PT Education - 10/14/20 1040     Education Details Reviewed goals, educated imporntace of posture, answered questions regarding reps and frequency    Person(s) Educated Patient    Comprehension Verbalized understanding;Returned demonstration;Tactile cues required;Verbal cues required;Need further  instruction              PT Short Term Goals - 10/10/20 1114       PT SHORT TERM GOAL #1   Title Patient wil be able to sit in active seated posture for 45 seconds (with towel/pillow/foot support) if needed to demonstrate improved active posture    Time 2    Period Weeks    Status New    Target Date 10/24/20      PT SHORT TERM GOAL #2   Title Patient will report at least 25% improvement in overall symptoms and/or function to demonstrate improved functional mobility    Time 2    Period Weeks    Status New    Target Date 10/24/20      PT SHORT TERM GOAL #3   Title Patient will be independent in self management strategies to improve quality of life and functional outcomes.    Time 2    Period Weeks    Status New    Target Date 10/24/20               PT Long Term Goals - 10/10/20 1115       PT LONG TERM GOAL #1   Title Patient will report at least 50% improvement in overall symptoms and/or  function to demonstrate improved functional mobility    Time 4    Period Weeks    Status New    Target Date 11/07/20      PT LONG TERM GOAL #2   Title Patient will be able to stand on either leg in SLS for at leat 5 seconds with no UE support to demonstrate improved static balance    Time 4    Period Weeks    Status New    Target Date 11/07/20      PT LONG TERM GOAL #3   Title Patient will meet predicted FOTO score to demonstrate improved overall function.    Time 4    Period Weeks    Status Revised    Target Date 11/07/20                   Plan - 10/14/20 1157     Clinical Impression Statement Pt arrived late for session, unable to cimplete full session today.  Reviewed goals, educated importance of HEP compliance, pt educated on appropriate frequency and reps to complete daily at home.  Educated importance of posture that was reminded though out session.  Tactile and verbal cueing to improve chin tucks.  Pt wiht tendency to return to significant forward head and rounded shoulders.  Added standing marching iwth HHA to HEP for core and balance training, pt able to demonstrate appropriate mechanics safely and given folder and printout to recall at home.  No reports of pain through session.    Personal Factors and Comorbidities Comorbidity 1    Comorbidities heart conditions    Examination-Activity Limitations Sit;Stand;Lift;Transfers    Examination-Participation Restrictions Cleaning    Stability/Clinical Decision Making Stable/Uncomplicated    Clinical Decision Making Low    Rehab Potential Fair    PT Frequency 2x / week    PT Duration 4 weeks    PT Treatment/Interventions ADLs/Self Care Home Management;Biofeedback;Electrical Stimulation;Cryotherapy;Moist Heat;Therapeutic exercise;Traction;Therapeutic activities;Manual techniques;DME Instruction;Neuromuscular re-education;Patient/family education;Dry needling;Passive range of motion;Gait training    PT Next Visit Plan  posture exercises, core strengthening, a couple static balance exercises for HEP    PT Home Exercise Plan seated posture, shoulder  ER with good posture; 9/23: standing tall and marching with HHA    Consulted and Agree with Plan of Care Patient             Patient will benefit from skilled therapeutic intervention in order to improve the following deficits and impairments:  Pain, Difficulty walking, Decreased endurance, Decreased strength, Decreased activity tolerance, Decreased balance, Decreased range of motion, Postural dysfunction  Visit Diagnosis: Cervicalgia  Muscle weakness (generalized)  Chronic left shoulder pain     Problem List Patient Active Problem List   Diagnosis Date Noted   Osteoarthritis cervical spine 09/21/2020   Compression fracture of T12 vertebra (HCC) 04/16/2018   Atrial fibrillation (HCC) 03/03/2018   SBO (small bowel obstruction) (HCC) 01/26/2017   Encounter for postoperative carotid endarterectomy surveillance 09/21/2015   Occlusion and stenosis of carotid artery without mention of cerebral infarction 02/05/2012   Becky Sax, LPTA/CLT; CBIS [phone]  Juel Burrow, PTA 10/14/2020, 12:05 PM  [facility] [address] [place], [place], [address] Phone: [phone]   Fax:  [phone]  Name: VERITY GILCREST MRN: [id number] Date of Birth: Jul 29, 1934

## 2020-10-20 ENCOUNTER — Ambulatory Visit (HOSPITAL_COMMUNITY): Payer: Medicare Other | Admitting: Physical Therapy

## 2020-10-20 ENCOUNTER — Other Ambulatory Visit: Payer: Self-pay

## 2020-10-20 DIAGNOSIS — M542 Cervicalgia: Secondary | ICD-10-CM

## 2020-10-20 DIAGNOSIS — M25512 Pain in left shoulder: Secondary | ICD-10-CM

## 2020-10-20 DIAGNOSIS — G8929 Other chronic pain: Secondary | ICD-10-CM

## 2020-10-20 DIAGNOSIS — M6281 Muscle weakness (generalized): Secondary | ICD-10-CM

## 2020-10-20 NOTE — Therapy (Signed)
[facility] [address] [place], [place], [address] Phone: [phone]   Fax:  [phone]  Physical Therapy Treatment  Patient Details  Name: [name] MRN: [id number] Date of Birth: [birth year] Referring Provider (PT): Valeria Batman, MD   Encounter Date: 10/20/2020   PT End of Session - 10/20/20 1128     Visit Number 3    Number of Visits 8    Date for PT Re-Evaluation 12/05/20    Authorization Type UHC Medicare No CL or auth required    Progress Note Due on Visit 10    PT Start Time 1050    PT Stop Time 1128    PT Time Calculation (min) 38 min    Activity Tolerance Patient tolerated treatment well    Behavior During Therapy WFL for tasks assessed/performed             Past Medical History:  Diagnosis Date   Arthritis    Atrial fibrillation (HCC)    Carotid artery occlusion    Colitis    COPD (chronic obstructive pulmonary disease) (HCC)    Diverticulitis    Hypertension    Keratosis, seborrheic     Past Surgical History:  Procedure Laterality Date   ABDOMINAL HYSTERECTOMY     BREAST EXCISIONAL BIOPSY Left    BREAST EXCISIONAL BIOPSY Left    BREAST EXCISIONAL BIOPSY Right    BREAST SURGERY     cyst removal   CAROTID ENDARTERECTOMY Left 2010   CHOLECYSTECTOMY     EYE SURGERY Bilateral 06/2014   cataracts   JOINT REPLACEMENT     RIGHT KNEE   REPLACEMENT TOTAL KNEE Right     There were no vitals filed for this visit.   Subjective Assessment - 10/20/20 1110     Subjective Pt states that she is not doing so bad today but she had a really bad day yesterday.    Pertinent History hx of falls, HOH, hx of carotid artery surgery > 5 years ago on left side    Currently in Pain? Yes    Pain Score 7     Pain Location Neck    Pain Orientation Left    Pain Descriptors / Indicators Aching    Pain Radiating Towards to shoulder    Pain Onset More than a month ago    Pain Frequency Constant    Aggravating  Factors  rotation    Pain Relieving Factors meds    Effect of Pain on Daily Activities limits                               [facility] Adult PT Treatment/Exercise - 10/20/20 0001       Posture/Postural Control   Postural Limitations Forward head;Rounded Shoulders;Increased thoracic kyphosis      Exercises   Exercises Neck      Neck Exercises: Seated   Neck Retraction 10 reps    Neck Retraction Limitations cuing that she is holding a grapfruit between her chin and neck.    W Back 10 reps    W Back Limitations 3" holds    Shoulder Shrugs 5 reps    Shoulder Shrugs Limitations up,back and relax.    Shoulder Rolls Backwards;10 reps    Other Seated Exercise tall posture x 10      Neck Exercises: Supine   Cervical Isometrics Extension;Right lateral flexion;Left lateral flexion;5 secs;5 reps  Neck Retraction 10 reps    Neck Retraction Limitations scapular retraction x 10    Capital Flexion 10 reps    Cervical Rotation 10 reps;Both    Lateral Flexion 5 reps;Both    Shoulder Flexion 5 reps;Both    Shoulder Flexion Limitations to 90 degrees    Other Supine Exercise ER with scapular retraction x 10      Manual Therapy   Manual Therapy Soft tissue mobilization;Manual Traction    Manual therapy comments completed seperate from all other intervention    Soft tissue mobilization to decrease tightness improve ROM    Manual Traction to decrease pain                       PT Short Term Goals - 10/20/20 1134       PT SHORT TERM GOAL #1   Title Patient wil be able to sit in active seated posture for 45 seconds (with towel/pillow/foot support) if needed to demonstrate improved active posture    Time 2    Period Weeks    Status On-going    Target Date 10/24/20      PT SHORT TERM GOAL #2   Title Patient will report at least 25% improvement in overall symptoms and/or function to demonstrate improved functional mobility    Time 2    Period Weeks    Status  On-going    Target Date 10/24/20      PT SHORT TERM GOAL #3   Title Patient will be independent in self management strategies to improve quality of life and functional outcomes.    Time 2    Period Weeks    Status On-going    Target Date 10/24/20               PT Long Term Goals - 10/20/20 1135       PT LONG TERM GOAL #1   Title Patient will report at least 50% improvement in overall symptoms and/or function to demonstrate improved functional mobility    Time 4    Period Weeks    Status On-going      PT LONG TERM GOAL #2   Title Patient will be able to stand on either leg in SLS for at leat 5 seconds with no UE support to demonstrate improved static balance    Time 4    Period Weeks    Status On-going      PT LONG TERM GOAL #3   Title Patient will meet predicted FOTO score to demonstrate improved overall function.    Time 4    Period Weeks    Status On-going                   Plan - 10/20/20 1129     Clinical Impression Statement Began manual with gentle traction to improve pain.  Pt instruced in postural exercises.  Treatment should continue to emphasis posture more than anything else as completing ROM with such significant forward head and increased kyphosis will not result in any improvement.    Personal Factors and Comorbidities Comorbidity 1    Comorbidities heart conditions    Examination-Activity Limitations Sit;Stand;Lift;Transfers    Examination-Participation Restrictions Cleaning    Stability/Clinical Decision Making Stable/Uncomplicated    Rehab Potential Fair    PT Frequency 2x / week    PT Duration 4 weeks    PT Treatment/Interventions ADLs/Self Care Home Management;Biofeedback;Electrical Stimulation;Cryotherapy;Moist Heat;Therapeutic exercise;Traction;Therapeutic activities;Manual techniques;DME Instruction;Neuromuscular re-education;Patient/family education;Dry  needling;Passive range of motion;Gait training    PT Next Visit Plan standing  posture exercises against a door. , core strengthening, a couple static balance exercises for HEP    PT Home Exercise Plan seated posture, shoulder ER with good posture; 9/23: standing tall and marching with HHA    Consulted and Agree with Plan of Care Patient             Patient will benefit from skilled therapeutic intervention in order to improve the following deficits and impairments:  Pain, Difficulty walking, Decreased endurance, Decreased strength, Decreased activity tolerance, Decreased balance, Decreased range of motion, Postural dysfunction  Visit Diagnosis: Cervicalgia  Muscle weakness (generalized)  Chronic left shoulder pain     Problem List Patient Active Problem List   Diagnosis Date Noted   Osteoarthritis cervical spine 09/21/2020   Compression fracture of T12 vertebra (HCC) 04/16/2018   Atrial fibrillation (HCC) 03/03/2018   SBO (small bowel obstruction) (HCC) 01/26/2017   Encounter for postoperative carotid endarterectomy surveillance 09/21/2015   Occlusion and stenosis of carotid artery without mention of cerebral infarction 02/05/2012   Virgina Organ, PT CLT [phone]  10/20/2020, 11:35 AM  [facility] [address] [place], [place], [address] Phone: [phone]   Fax:  [phone]  Name: [name] MRN: [id number] Date of Birth: September 11, 1934

## 2020-10-26 ENCOUNTER — Telehealth (HOSPITAL_COMMUNITY): Payer: Self-pay | Admitting: Physical Therapy

## 2020-10-26 ENCOUNTER — Encounter (HOSPITAL_COMMUNITY): Payer: Medicare Other | Admitting: Physical Therapy

## 2020-10-26 NOTE — Telephone Encounter (Signed)
She woke up sick and will not be here

## 2020-10-27 ENCOUNTER — Other Ambulatory Visit: Payer: Self-pay

## 2020-10-27 ENCOUNTER — Ambulatory Visit (HOSPITAL_COMMUNITY): Payer: Medicare Other | Attending: Orthopaedic Surgery | Admitting: Physical Therapy

## 2020-10-27 DIAGNOSIS — G8929 Other chronic pain: Secondary | ICD-10-CM | POA: Diagnosis not present

## 2020-10-27 DIAGNOSIS — M542 Cervicalgia: Secondary | ICD-10-CM | POA: Insufficient documentation

## 2020-10-27 DIAGNOSIS — M6281 Muscle weakness (generalized): Secondary | ICD-10-CM | POA: Diagnosis not present

## 2020-10-27 DIAGNOSIS — M25512 Pain in left shoulder: Secondary | ICD-10-CM | POA: Diagnosis not present

## 2020-10-27 NOTE — Therapy (Signed)
[facility] [address] [place], [place], [address] Phone: [phone]   Fax:  [phone]  Physical Therapy Treatment  Patient Details  Name: [name] MRN: [id number] Date of Birth: 06/05/1934 Referring Provider (PT): Valeria Batman, MD   Encounter Date: 10/27/2020   PT End of Session - 10/27/20 1226     Visit Number 4    Number of Visits 8    Date for PT Re-Evaluation 12/05/20    Authorization Type UHC Medicare No CL or auth required    Progress Note Due on Visit 10    PT Start Time 1138    PT Stop Time 1216    PT Time Calculation (min) 38 min    Activity Tolerance Patient tolerated treatment well    Behavior During Therapy WFL for tasks assessed/performed             Past Medical History:  Diagnosis Date   Arthritis    Atrial fibrillation (HCC)    Carotid artery occlusion    Colitis    COPD (chronic obstructive pulmonary disease) (HCC)    Diverticulitis    Hypertension    Keratosis, seborrheic     Past Surgical History:  Procedure Laterality Date   ABDOMINAL HYSTERECTOMY     BREAST EXCISIONAL BIOPSY Left    BREAST EXCISIONAL BIOPSY Left    BREAST EXCISIONAL BIOPSY Right    BREAST SURGERY     cyst removal   CAROTID ENDARTERECTOMY Left 2010   CHOLECYSTECTOMY     EYE SURGERY Bilateral 06/2014   cataracts   JOINT REPLACEMENT     RIGHT KNEE   REPLACEMENT TOTAL KNEE Right     There were no vitals filed for this visit.   Subjective Assessment - 10/27/20 1150     Subjective Pt states she had an upset stomach yesterday but feels better today.  States she only has pain when she moves her head a certain way.  0/10 at rest and 6/10 with certain movements.    Currently in Pain? Yes    Pain Score 6     Pain Location Neck    Pain Orientation Left                               [facility] Adult PT Treatment/Exercise - 10/27/20 0001       Neck Exercises: Seated   Neck Retraction 10 reps     Neck Retraction Limitations cuing that she is holding a grapfruit between her chin and neck.    W Back 10 reps    W Back Limitations 3" holds    Shoulder Shrugs 10 reps    Shoulder Shrugs Limitations up,back and relax.    Shoulder Rolls Backwards;10 reps    Other Seated Exercise tall posture x 10      Neck Exercises: Supine   Neck Retraction 10 reps    Neck Retraction Limitations scapular retraction x 10    Cervical Rotation 10 reps;Both    Shoulder Flexion 5 reps;Both    Shoulder Flexion Limitations to 90 degrees      Modalities   Modalities Moist Heat      Moist Heat Therapy   Number Minutes Moist Heat 10 Minutes    Moist Heat Location Cervical   with exercises in supine     Manual Therapy   Manual Therapy Soft tissue mobilization    Manual therapy comments completed seperate  from all other intervention    Soft tissue mobilization to decrease tightness improve ROM                     PT Education - 10/27/20 1147     Education Details to try heat at home, continue HEP    Person(s) Educated Patient    Methods Explanation    Comprehension Verbalized understanding              PT Short Term Goals - 10/20/20 1134       PT SHORT TERM GOAL #1   Title Patient wil be able to sit in active seated posture for 45 seconds (with towel/pillow/foot support) if needed to demonstrate improved active posture    Time 2    Period Weeks    Status On-going    Target Date 10/24/20      PT SHORT TERM GOAL #2   Title Patient will report at least 25% improvement in overall symptoms and/or function to demonstrate improved functional mobility    Time 2    Period Weeks    Status On-going    Target Date 10/24/20      PT SHORT TERM GOAL #3   Title Patient will be independent in self management strategies to improve quality of life and functional outcomes.    Time 2    Period Weeks    Status On-going    Target Date 10/24/20               PT Long Term Goals -  10/20/20 1135       PT LONG TERM GOAL #1   Title Patient will report at least 50% improvement in overall symptoms and/or function to demonstrate improved functional mobility    Time 4    Period Weeks    Status On-going      PT LONG TERM GOAL #2   Title Patient will be able to stand on either leg in SLS for at leat 5 seconds with no UE support to demonstrate improved static balance    Time 4    Period Weeks    Status On-going      PT LONG TERM GOAL #3   Title Patient will meet predicted FOTO score to demonstrate improved overall function.    Time 4    Period Weeks    Status On-going                   Plan - 10/27/20 1227     Clinical Impression Statement continued with established therex.  Added moist heat to supine exercises with good ROM noted and more discomfort on Lt vs Rt Upper trap region.  Manual completed in seated this session with tightness and spasms noted on Lt side but none on Rt today. Continued education and seated work on posture as well.   Pt reported overall improvement following manual with less pain with cervical ROM.    Personal Factors and Comorbidities Comorbidity 1    Comorbidities heart conditions    Examination-Activity Limitations Sit;Stand;Lift;Transfers    Examination-Participation Restrictions Cleaning    Stability/Clinical Decision Making Stable/Uncomplicated    Rehab Potential Fair    PT Frequency 2x / week    PT Duration 4 weeks    PT Treatment/Interventions ADLs/Self Care Home Management;Biofeedback;Electrical Stimulation;Cryotherapy;Moist Heat;Therapeutic exercise;Traction;Therapeutic activities;Manual techniques;DME Instruction;Neuromuscular re-education;Patient/family education;Dry needling;Passive range of motion;Gait training    PT Next Visit Plan standing posture exercises against a door. , core strengthening, a  couple static balance exercises for HEP    PT Home Exercise Plan seated posture, shoulder ER with good posture; 9/23:  standing tall and marching with HHA    Consulted and Agree with Plan of Care Patient             Patient will benefit from skilled therapeutic intervention in order to improve the following deficits and impairments:  Pain, Difficulty walking, Decreased endurance, Decreased strength, Decreased activity tolerance, Decreased balance, Decreased range of motion, Postural dysfunction  Visit Diagnosis: Cervicalgia  Muscle weakness (generalized)  Chronic left shoulder pain     Problem List Patient Active Problem List   Diagnosis Date Noted   Osteoarthritis cervical spine 09/21/2020   Compression fracture of T12 vertebra (HCC) 04/16/2018   Atrial fibrillation (HCC) 03/03/2018   SBO (small bowel obstruction) (HCC) 01/26/2017   Encounter for postoperative carotid endarterectomy surveillance 09/21/2015   Occlusion and stenosis of carotid artery without mention of cerebral infarction 02/05/2012   Lurena Nida, PTA/CLT, WTA [phone]  Lurena Nida, PTA 10/27/2020, 12:40 PM  [facility] [address] [place], [place], [address] Phone: [phone]   Fax:  [phone]  Name: BRUNILDA EBLE MRN: [id number] Date of Birth: 1934/08/02

## 2020-11-01 ENCOUNTER — Ambulatory Visit (HOSPITAL_COMMUNITY): Payer: Medicare Other

## 2020-11-01 ENCOUNTER — Other Ambulatory Visit: Payer: Self-pay

## 2020-11-01 ENCOUNTER — Encounter (HOSPITAL_COMMUNITY): Payer: Self-pay

## 2020-11-01 DIAGNOSIS — M542 Cervicalgia: Secondary | ICD-10-CM | POA: Diagnosis not present

## 2020-11-01 DIAGNOSIS — M25512 Pain in left shoulder: Secondary | ICD-10-CM

## 2020-11-01 DIAGNOSIS — G8929 Other chronic pain: Secondary | ICD-10-CM

## 2020-11-01 DIAGNOSIS — M6281 Muscle weakness (generalized): Secondary | ICD-10-CM | POA: Diagnosis not present

## 2020-11-01 NOTE — Therapy (Signed)
[facility] [address] [place], [place], [address] Phone: [phone]   Fax:  [phone]  Physical Therapy Treatment  Patient Details  Name: [name] MRN: [id number] Date of Birth: [birth year] Referring Provider (PT): Valeria Batman, MD   Encounter Date: 11/01/2020   PT End of Session - 11/01/20 1144     Visit Number 5    Number of Visits 8    Date for PT Re-Evaluation 12/05/20    Authorization Type UHC Medicare No CL or auth required    Progress Note Due on Visit 10    PT Start Time 1138    PT Stop Time 1220    PT Time Calculation (min) 42 min    Activity Tolerance Patient tolerated treatment well    Behavior During Therapy WFL for tasks assessed/performed             Past Medical History:  Diagnosis Date   Arthritis    Atrial fibrillation (HCC)    Carotid artery occlusion    Colitis    COPD (chronic obstructive pulmonary disease) (HCC)    Diverticulitis    Hypertension    Keratosis, seborrheic     Past Surgical History:  Procedure Laterality Date   ABDOMINAL HYSTERECTOMY     BREAST EXCISIONAL BIOPSY Left    BREAST EXCISIONAL BIOPSY Left    BREAST EXCISIONAL BIOPSY Right    BREAST SURGERY     cyst removal   CAROTID ENDARTERECTOMY Left 2010   CHOLECYSTECTOMY     EYE SURGERY Bilateral 06/2014   cataracts   JOINT REPLACEMENT     RIGHT KNEE   REPLACEMENT TOTAL KNEE Right     There were no vitals filed for this visit.   Subjective Assessment - 11/01/20 1142     Subjective Pt stated she is feeling good today, no reports of exercise.  Reports she feels off balance daily, no reports of recent falls.    Pertinent History hx of falls, HOH, hx of carotid artery surgery > 5 years ago on left side    Currently in Pain? No/denies                               [facility] Adult PT Treatment/Exercise - 11/01/20 0001       Posture/Postural Control   Posture/Postural Control Postural  limitations    Postural Limitations Forward head;Rounded Shoulders;Increased thoracic kyphosis      Neck Exercises: Standing   Other Standing Exercises chin tuck facing wall "cueing for holding a grapefruit between chin and neck; modified wall arch    Other Standing Exercises tandem stance with HHA 3x 30"; alternating marching 1 HHA 10x 5"      Neck Exercises: Seated   Neck Retraction 10 reps    X to V 10 reps    W Back 10 reps    W Back Limitations 3" holds    Other Seated Exercise tall posture x 10                       PT Short Term Goals - 10/20/20 1134       PT SHORT TERM GOAL #1   Title Patient wil be able to sit in active seated posture for 45 seconds (with towel/pillow/foot support) if needed to demonstrate improved active posture    Time 2    Period Weeks    Status On-going  Target Date 10/24/20      PT SHORT TERM GOAL #2   Title Patient will report at least 25% improvement in overall symptoms and/or function to demonstrate improved functional mobility    Time 2    Period Weeks    Status On-going    Target Date 10/24/20      PT SHORT TERM GOAL #3   Title Patient will be independent in self management strategies to improve quality of life and functional outcomes.    Time 2    Period Weeks    Status On-going    Target Date 10/24/20               PT Long Term Goals - 10/20/20 1135       PT LONG TERM GOAL #1   Title Patient will report at least 50% improvement in overall symptoms and/or function to demonstrate improved functional mobility    Time 4    Period Weeks    Status On-going      PT LONG TERM GOAL #2   Title Patient will be able to stand on either leg in SLS for at leat 5 seconds with no UE support to demonstrate improved static balance    Time 4    Period Weeks    Status On-going      PT LONG TERM GOAL #3   Title Patient will meet predicted FOTO score to demonstrate improved overall function.    Time 4    Period Weeks     Status On-going                   Plan - 11/01/20 1251     Clinical Impression Statement Added balance training this session and additional HEP exercises to address balance deficitis.  Pt educated on importance of posture to assist with balance, reminders required through session.  Pt required HHA with tandem stance and marching for safety.  Added standing postural exercises with verbal and tactile cuieng required.  No reoprts of pain through session.    Personal Factors and Comorbidities Comorbidity 1    Comorbidities heart conditions    Examination-Activity Limitations Sit;Stand;Lift;Transfers    Examination-Participation Restrictions Cleaning    Stability/Clinical Decision Making Stable/Uncomplicated    Clinical Decision Making Low    PT Frequency 2x / week    PT Duration 4 weeks    PT Treatment/Interventions ADLs/Self Care Home Management;Biofeedback;Electrical Stimulation;Cryotherapy;Moist Heat;Therapeutic exercise;Traction;Therapeutic activities;Manual techniques;DME Instruction;Neuromuscular re-education;Patient/family education;Dry needling;Passive range of motion;Gait training    PT Next Visit Plan standing posture exercises against a door. , core strengthening, a couple static balance exercises for HEP    PT Home Exercise Plan seated posture, shoulder ER with good posture; 9/23: standing tall and marching with HHA; 10/11: tandem stance, sidestep and marching    Consulted and Agree with Plan of Care Patient             Patient will benefit from skilled therapeutic intervention in order to improve the following deficits and impairments:  Pain, Difficulty walking, Decreased endurance, Decreased strength, Decreased activity tolerance, Decreased balance, Decreased range of motion, Postural dysfunction  Visit Diagnosis: Cervicalgia  Muscle weakness (generalized)  Chronic left shoulder pain     Problem List Patient Active Problem List   Diagnosis Date Noted    Osteoarthritis cervical spine 09/21/2020   Compression fracture of T12 vertebra (HCC) 04/16/2018   Atrial fibrillation (HCC) 03/03/2018   SBO (small bowel obstruction) (HCC) 01/26/2017   Encounter for postoperative carotid  endarterectomy surveillance 09/21/2015   Occlusion and stenosis of carotid artery without mention of cerebral infarction 02/05/2012   Becky Sax, LPTA/CLT; CBIS [phone]  Juel Burrow, PTA 11/01/2020, 12:55 PM  [facility] [address] [place], [place], [address] Phone: [phone]   Fax:  [phone]  Name: [name] MRN: [id number] Date of Birth: 1934-12-23

## 2020-11-03 ENCOUNTER — Ambulatory Visit (HOSPITAL_COMMUNITY): Payer: Medicare Other | Admitting: Physical Therapy

## 2020-11-03 ENCOUNTER — Other Ambulatory Visit: Payer: Self-pay

## 2020-11-03 DIAGNOSIS — M6281 Muscle weakness (generalized): Secondary | ICD-10-CM

## 2020-11-03 DIAGNOSIS — M542 Cervicalgia: Secondary | ICD-10-CM | POA: Diagnosis not present

## 2020-11-03 DIAGNOSIS — M25512 Pain in left shoulder: Secondary | ICD-10-CM | POA: Diagnosis not present

## 2020-11-03 DIAGNOSIS — G8929 Other chronic pain: Secondary | ICD-10-CM

## 2020-11-03 NOTE — Therapy (Signed)
[facility] [address] [place], [place], [address] Phone: [phone]   Fax:  [phone]  Physical Therapy Treatment  Patient Details  Name: [name] MRN: [id number] Date of Birth: 1934-06-28 Referring Provider (PT): Valeria Batman, MD   Encounter Date: 11/03/2020   PT End of Session - 11/03/20 1359     Visit Number 6    Number of Visits 8    Date for PT Re-Evaluation 12/05/20    Authorization Type UHC Medicare No CL or auth required    Progress Note Due on Visit 10    PT Start Time 1130    PT Stop Time 1212    PT Time Calculation (min) 42 min    Activity Tolerance Patient tolerated treatment well    Behavior During Therapy [facility] for tasks assessed/performed             Past Medical History:  Diagnosis Date   Arthritis    Atrial fibrillation (HCC)    Carotid artery occlusion    Colitis    COPD (chronic obstructive pulmonary disease) (HCC)    Diverticulitis    Hypertension    Keratosis, seborrheic     Past Surgical History:  Procedure Laterality Date   ABDOMINAL HYSTERECTOMY     BREAST EXCISIONAL BIOPSY Left    BREAST EXCISIONAL BIOPSY Left    BREAST EXCISIONAL BIOPSY Right    BREAST SURGERY     cyst removal   CAROTID ENDARTERECTOMY Left 2010   CHOLECYSTECTOMY     EYE SURGERY Bilateral 06/2014   cataracts   JOINT REPLACEMENT     RIGHT KNEE   REPLACEMENT TOTAL KNEE Right     There were no vitals filed for this visit.   Subjective Assessment - 11/03/20 1137     Subjective pt states she can tell her balance is improving and really not having much pain in her neck unless she moves her head certain ways.    Currently in Pain? Yes    Pain Score 4     Pain Orientation Left    Pain Descriptors / Indicators Aching                               [facility] Adult PT Treatment/Exercise - 11/03/20 0001       Neck Exercises: Standing   Other Standing Exercises against wall with UE facing  stretch 10X, back against UE flexion 10X tall posture      Neck Exercises: Seated   Neck Retraction 10 reps    Neck Retraction Limitations 2 sets    X to V 10 reps    W Back 10 reps    W Back Limitations 3" holds    Other Seated Exercise tall posture x 10      Neck Exercises: Supine   Other Supine Exercise core stab: abdominal isometrics, glute isometrics, bridge 10X each                 Balance Exercises - 11/03/20 0001       Balance Exercises: Standing   Tandem Stance 3 reps;30 secs;Foam/compliant surface;Intermittent upper extremity support    SLS with Vectors 5 reps;3 reps;Upper extremity assist 1                  PT Short Term Goals - 10/20/20 1134       PT SHORT TERM GOAL #1   Title Patient  wil be able to sit in active seated posture for 45 seconds (with towel/pillow/foot support) if needed to demonstrate improved active posture    Time 2    Period Weeks    Status On-going    Target Date 10/24/20      PT SHORT TERM GOAL #2   Title Patient will report at least 25% improvement in overall symptoms and/or function to demonstrate improved functional mobility    Time 2    Period Weeks    Status On-going    Target Date 10/24/20      PT SHORT TERM GOAL #3   Title Patient will be independent in self management strategies to improve quality of life and functional outcomes.    Time 2    Period Weeks    Status On-going    Target Date 10/24/20               PT Long Term Goals - 10/20/20 1135       PT LONG TERM GOAL #1   Title Patient will report at least 50% improvement in overall symptoms and/or function to demonstrate improved functional mobility    Time 4    Period Weeks    Status On-going      PT LONG TERM GOAL #2   Title Patient will be able to stand on either leg in SLS for at leat 5 seconds with no UE support to demonstrate improved static balance    Time 4    Period Weeks    Status On-going      PT LONG TERM GOAL #3   Title  Patient will meet predicted FOTO score to demonstrate improved overall function.    Time 4    Period Weeks    Status On-going                   Plan - 11/03/20 1359     Clinical Impression Statement Began session with standing balance activities.  Added foam to tandem stance to increased challenge and began vector stance.  Cues to reduce ROM in order to maintain upright posturing with activity.  Continued with standing and seated postural and cervical strengthening/ROM .  Cues to keep head up rather than looking downward during activities.  No loss of balance, pain or issues completing these.  Began core stabilization exercises in prone as well working on abdominal stab and glute activation.    Personal Factors and Comorbidities Comorbidity 1    Comorbidities heart conditions    Examination-Activity Limitations Sit;Stand;Lift;Transfers    Examination-Participation Restrictions Cleaning    Stability/Clinical Decision Making Stable/Uncomplicated    PT Frequency 2x / week    PT Duration 4 weeks    PT Treatment/Interventions ADLs/Self Care Home Management;Biofeedback;Electrical Stimulation;Cryotherapy;Moist Heat;Therapeutic exercise;Traction;Therapeutic activities;Manual techniques;DME Instruction;Neuromuscular re-education;Patient/family education;Dry needling;Passive range of motion;Gait training    PT Next Visit Plan continue to improve standing posture, balance, core strengthening and reduction of cervical pain.    PT Home Exercise Plan seated posture, shoulder ER with good posture; 9/23: standing tall and marching with HHA; 10/11: tandem stance, sidestep and marching    Consulted and Agree with Plan of Care Patient             Patient will benefit from skilled therapeutic intervention in order to improve the following deficits and impairments:  Pain, Difficulty walking, Decreased endurance, Decreased strength, Decreased activity tolerance, Decreased balance, Decreased range of  motion, Postural dysfunction  Visit Diagnosis: Cervicalgia  Muscle weakness (generalized)  Chronic left shoulder pain     Problem List Patient Active Problem List   Diagnosis Date Noted   Osteoarthritis cervical spine 09/21/2020   Compression fracture of T12 vertebra (HCC) 04/16/2018   Atrial fibrillation (HCC) 03/03/2018   SBO (small bowel obstruction) (HCC) 01/26/2017   Encounter for postoperative carotid endarterectomy surveillance 09/21/2015   Occlusion and stenosis of carotid artery without mention of cerebral infarction 02/05/2012   Lurena Nida, PTA/CLT, WTA [phone]  Lurena Nida, PTA 11/03/2020, 2:02 PM  [facility] [address] [place], [place], [address] Phone: [phone]   Fax:  [phone]  Name: [name] MRN: [id number] Date of Birth: Apr 05, 1934

## 2020-11-10 ENCOUNTER — Ambulatory Visit (HOSPITAL_COMMUNITY): Payer: Medicare Other | Admitting: Physical Therapy

## 2020-11-10 ENCOUNTER — Other Ambulatory Visit: Payer: Self-pay

## 2020-11-10 ENCOUNTER — Encounter (HOSPITAL_COMMUNITY): Payer: Self-pay | Admitting: Physical Therapy

## 2020-11-10 DIAGNOSIS — M542 Cervicalgia: Secondary | ICD-10-CM

## 2020-11-10 DIAGNOSIS — M25512 Pain in left shoulder: Secondary | ICD-10-CM | POA: Diagnosis not present

## 2020-11-10 DIAGNOSIS — M6281 Muscle weakness (generalized): Secondary | ICD-10-CM | POA: Diagnosis not present

## 2020-11-10 DIAGNOSIS — G8929 Other chronic pain: Secondary | ICD-10-CM | POA: Diagnosis not present

## 2020-11-10 NOTE — Therapy (Signed)
[facility] [address] [place], [place], [address] Phone: [phone]   Fax:  [phone]  Physical Therapy Treatment and Progress Note  Patient Details  Name: [name] MRN: [id number] Date of Birth: 20-May-1934 Referring Provider (PT): Garald Balding, MD  Progress Note Reporting Period 10/10/20 to 11/10/20  See note below for Objective Data and Assessment of Progress/Goals.      Encounter Date: 11/10/2020   PT End of Session - 11/10/20 1133     Visit Number 7    Number of Visits 8    Date for PT Re-Evaluation 12/05/20    Authorization Type [employer] Medicare No CL or auth required    Progress Note Due on Visit 10    PT Start Time 1133    PT Stop Time 1211    PT Time Calculation (min) 38 min    Activity Tolerance Patient tolerated treatment well    Behavior During Therapy WFL for tasks assessed/performed             Past Medical History:  Diagnosis Date   Arthritis    Atrial fibrillation ([place])    Carotid artery occlusion    Colitis    COPD (chronic obstructive pulmonary disease) (HCC)    Diverticulitis    Hypertension    Keratosis, seborrheic     Past Surgical History:  Procedure Laterality Date   ABDOMINAL HYSTERECTOMY     BREAST EXCISIONAL BIOPSY Left    BREAST EXCISIONAL BIOPSY Left    BREAST EXCISIONAL BIOPSY Right    BREAST SURGERY     cyst removal   CAROTID ENDARTERECTOMY Left 2010   CHOLECYSTECTOMY     EYE SURGERY Bilateral 06/2014   cataracts   JOINT REPLACEMENT     RIGHT KNEE   REPLACEMENT TOTAL KNEE Right     There were no vitals filed for this visit.   Subjective Assessment - 11/10/20 1136     Subjective States she is feeling good today. Reports on Tuesday she was unwell but like a sick unwell but her COVID test came back negative. States she has no pain currently. States she feels her balance is better and she hasn't fallen. States that she does her exercises every couple of days. Reports  overall she feels about 50% better                [facility] PT Assessment - 11/10/20 0001       Assessment   Medical Diagnosis neck pain    Referring Provider (PT) Garald Balding, MD      Observation/Other Assessments   Focus on Therapeutic Outcomes (FOTO)  62% function, predicted 60% function      Posture/Postural Control   Posture/Postural Control Postural limitations    Postural Limitations Forward head;Rounded Shoulders;Increased thoracic kyphosis      Static Standing Balance   Static Standing - Balance Support No upper extremity supported    Static Standing - Level of Assistance 5: Stand by assistance    Static Standing Balance -  Activities  Single Leg Stance - Left Leg;Single Leg Stance - Right Leg    Static Standing - Comment/# of Minutes L 4 seconds, R 1 seconds                           [facility] Adult PT Treatment/Exercise - 11/10/20 0001       Neck Exercises: Standing   Other Standing Exercises hip abd  with one hand support 3x5 B, hip extension 3x5 B one hand support; staggered stance with one foot on 8" step max effort and SBA x5 B    Other Standing Exercises SLS with 3 2 finger support with max effort x5 B      Neck Exercises: Seated   Other Seated Exercise tall posture x 10    Other Seated Exercise STS 2x10 slow and controlled                       PT Short Term Goals - 11/10/20 1136       PT SHORT TERM GOAL #1   Title Patient wil be able to sit in active seated posture for 45 seconds (with towel/pillow/foot support) if needed to demonstrate improved active posture    Baseline slight right shift but able to hold active seated posture > 45 seconds    Time 2    Period Weeks    Status Achieved    Target Date 10/24/20      PT SHORT TERM GOAL #2   Title Patient will report at least 25% improvement in overall symptoms and/or function to demonstrate improved functional mobility    Time 2    Period Weeks    Status Achieved     Target Date 10/24/20      PT SHORT TERM GOAL #3   Title Patient will be independent in self management strategies to improve quality of life and functional outcomes.    Time 2    Period Weeks    Status On-going    Target Date 10/24/20               PT Long Term Goals - 11/10/20 1138       PT LONG TERM GOAL #1   Title Patient will report at least 50% improvement in overall symptoms and/or function to demonstrate improved functional mobility    Time 4    Period Weeks    Status Achieved      PT LONG TERM GOAL #2   Title Patient will be able to stand on either leg in SLS for at leat 5 seconds with no UE support to demonstrate improved static balance    Time 4    Period Weeks    Status On-going      PT LONG TERM GOAL #3   Title Patient will meet predicted FOTO score to demonstrate improved overall function.    Time 4    Period Weeks    Status Achieved                   Plan - 11/10/20 1202     Clinical Impression Statement Progress Note performed on this date overall 2/3 short and 2/3 long term goals met at this time. Answered all questions and patient would like next session to be last session. Continued to add to HEP will DC patient next session pending patient presentation. Patient required verbal cues to remind her of posture throughout session.    Personal Factors and Comorbidities Comorbidity 1    Comorbidities heart conditions    Examination-Activity Limitations Sit;Stand;Lift;Transfers    Examination-Participation Restrictions Cleaning    Stability/Clinical Decision Making Stable/Uncomplicated    PT Frequency 2x / week    PT Duration 4 weeks    PT Treatment/Interventions ADLs/Self Care Home Management;Biofeedback;Electrical Stimulation;Cryotherapy;Moist Heat;Therapeutic exercise;Traction;Therapeutic activities;Manual techniques;DME Instruction;Neuromuscular re-education;Patient/family education;Dry needling;Passive range of motion;Gait training    PT Next  Visit Plan  continue with balance nad functional strength. Anticipate DC next session    PT Home Exercise Plan seated posture, shoulder ER with good posture; 9/23: standing tall and marching with HHA; 10/11: tandem stance, sidestep and marching; 10/20 STS    Consulted and Agree with Plan of Care Patient             Patient will benefit from skilled therapeutic intervention in order to improve the following deficits and impairments:  Pain, Difficulty walking, Decreased endurance, Decreased strength, Decreased activity tolerance, Decreased balance, Decreased range of motion, Postural dysfunction  Visit Diagnosis: Cervicalgia  Muscle weakness (generalized)  Chronic left shoulder pain     Problem List Patient Active Problem List   Diagnosis Date Noted   Osteoarthritis cervical spine 09/21/2020   Compression fracture of T12 vertebra ([place]) 04/16/2018   Atrial fibrillation ([place]) 03/03/2018   SBO (small bowel obstruction) ([place]) 01/26/2017   Encounter for postoperative carotid endarterectomy surveillance 09/21/2015   Occlusion and stenosis of carotid artery without mention of cerebral infarction 02/05/2012   12:11 PM, 11/10/20 Jerene Pitch, DPT Physical Therapy with [facility]  [phone] office   [place], [place], [address] Phone: [phone]   Fax:  [phone]  Name: [name] MRN: [id number] Date of Birth: 08-02-1934

## 2020-11-11 ENCOUNTER — Encounter (HOSPITAL_COMMUNITY): Payer: Self-pay | Admitting: Physical Therapy

## 2020-11-11 ENCOUNTER — Ambulatory Visit (HOSPITAL_COMMUNITY): Payer: Medicare Other | Admitting: Physical Therapy

## 2020-11-11 DIAGNOSIS — M25512 Pain in left shoulder: Secondary | ICD-10-CM

## 2020-11-11 DIAGNOSIS — G8929 Other chronic pain: Secondary | ICD-10-CM

## 2020-11-11 DIAGNOSIS — M6281 Muscle weakness (generalized): Secondary | ICD-10-CM | POA: Diagnosis not present

## 2020-11-11 DIAGNOSIS — M542 Cervicalgia: Secondary | ICD-10-CM | POA: Diagnosis not present

## 2020-11-11 NOTE — Therapy (Signed)
[facility] [address] [place], [place], [address] Phone: [phone]   Fax:  [phone]  Physical Therapy Treatment and Discharge Note  Patient Details  Name: [name] MRN: [id number] Date of Birth: 10/29/1934 Referring Provider (PT): Peter W Whitfield, MD   PHYSICAL THERAPY DISCHARGE SUMMARY  Visits from Start of Care: 8  Current functional level related to goals / functional outcomes: See below   Remaining deficits: See below   Education / Equipment: See below   Patient agrees to discharge. Patient goals were partially met. Patient is being discharged due to being pleased with the current functional level.   Encounter Date: 11/11/2020   PT End of Session - 11/11/20 1016     Visit Number 8    Number of Visits 8    Date for PT Re-Evaluation 12/05/20    Authorization Type [employer] Medicare No CL or auth required    Progress Note Due on Visit 10    PT Start Time 1011    PT Stop Time 1043    PT Time Calculation (min) 32 min    Activity Tolerance Patient tolerated treatment well    Behavior During Therapy WFL for tasks assessed/performed             Past Medical History:  Diagnosis Date   Arthritis    Atrial fibrillation (HCC)    Carotid artery occlusion    Colitis    COPD (chronic obstructive pulmonary disease) (HCC)    Diverticulitis    Hypertension    Keratosis, seborrheic     Past Surgical History:  Procedure Laterality Date   ABDOMINAL HYSTERECTOMY     BREAST EXCISIONAL BIOPSY Left    BREAST EXCISIONAL BIOPSY Left    BREAST EXCISIONAL BIOPSY Right    BREAST SURGERY     cyst removal   CAROTID ENDARTERECTOMY Left 2010   CHOLECYSTECTOMY     EYE SURGERY Bilateral 06/2014   cataracts   JOINT REPLACEMENT     RIGHT KNEE   REPLACEMENT TOTAL KNEE Right     There were no vitals filed for this visit.   Subjective Assessment - 11/11/20 1016     Subjective States that she did some of her exercises.  States that she did some of the exercises. Reports no difficulties with exercises.  Reports no functional limitations that she would like to work on this day. States she can't walk a long way but she is walking.    Currently in Pain? No/denies                [facility] PT Assessment - 11/11/20 0001       Assessment   Medical Diagnosis neck pain    Referring Provider (PT) Peter W Whitfield, MD      Ambulation/Gait   Ambulation/Gait Yes    Ambulation/Gait Assistance 6: Modified independent (Device/Increase time);5: Supervision    Ambulation Distance (Feet) 226 Feet    Assistive device Straight cane    Gait Comments 2MW, lose of balance with lack of focus on task at hand                           [facility] Adult PT Treatment/Exercise - 11/11/20 0001       Neck Exercises: Standing   Other Standing Exercises SLS wth one hand support 30 sec x3      Neck Exercises: Seated   Other Seated Exercise STS 2x10 slow and   controlled                     PT Education - 11/11/20 1027     Education Details on current presentation, on walking program on HEP and POC moving forward.    Person(s) Educated Patient    Methods Explanation    Comprehension Verbalized understanding              PT Short Term Goals - 11/10/20 1136       PT SHORT TERM GOAL #1   Title Patient wil be able to sit in active seated posture for 45 seconds (with towel/pillow/foot support) if needed to demonstrate improved active posture    Baseline slight right shift but able to hold active seated posture > 45 seconds    Time 2    Period Weeks    Status Achieved    Target Date 10/24/20      PT SHORT TERM GOAL #2   Title Patient will report at least 25% improvement in overall symptoms and/or function to demonstrate improved functional mobility    Time 2    Period Weeks    Status Achieved    Target Date 10/24/20      PT SHORT TERM GOAL #3   Title Patient will be independent in self  management strategies to improve quality of life and functional outcomes.    Time 2    Period Weeks    Status On-going    Target Date 10/24/20               PT Long Term Goals - 11/10/20 1138       PT LONG TERM GOAL #1   Title Patient will report at least 50% improvement in overall symptoms and/or function to demonstrate improved functional mobility    Time 4    Period Weeks    Status Achieved      PT LONG TERM GOAL #2   Title Patient will be able to stand on either leg in SLS for at leat 5 seconds with no UE support to demonstrate improved static balance    Time 4    Period Weeks    Status On-going      PT LONG TERM GOAL #3   Title Patient will meet predicted FOTO score to demonstrate improved overall function.    Time 4    Period Weeks    Status Achieved                   Plan - 11/11/20 1039     Clinical Impression Statement Overall patient is doing well and has met all but one short and long term goal. Answered all questions and discussed adding walking program to HEP. Added additional balance exercises per patient request. Patient to discharge from PT to HEP at this time secondary to progress made and patient's desire tor transition to home program.    Personal Factors and Comorbidities Comorbidity 1    Comorbidities heart conditions    Examination-Activity Limitations Sit;Stand;Lift;Transfers    Examination-Participation Restrictions Cleaning    Stability/Clinical Decision Making Stable/Uncomplicated    PT Frequency 2x / week    PT Duration 4 weeks    PT Treatment/Interventions ADLs/Self Care Home Management;Biofeedback;Electrical Stimulation;Cryotherapy;Moist Heat;Therapeutic exercise;Traction;Therapeutic activities;Manual techniques;DME Instruction;Neuromuscular re-education;Patient/family education;Dry needling;Passive range of motion;Gait training    PT Next Visit Plan DC to HEP    PT Home Exercise Plan seated posture, shoulder ER with good posture;  9/23: standing tall  and marching with HHA; 10/11: tandem stance, sidestep and marching; 10/20 STS, 10/21 SLS one hand support    Consulted and Agree with Plan of Care Patient             Patient will benefit from skilled therapeutic intervention in order to improve the following deficits and impairments:  Pain, Difficulty walking, Decreased endurance, Decreased strength, Decreased activity tolerance, Decreased balance, Decreased range of motion, Postural dysfunction  Visit Diagnosis: Cervicalgia  Muscle weakness (generalized)  Chronic left shoulder pain     Problem List Patient Active Problem List   Diagnosis Date Noted   Osteoarthritis cervical spine 09/21/2020   Compression fracture of T12 vertebra ([place]) 04/16/2018   Atrial fibrillation ([place]) 03/03/2018   SBO (small bowel obstruction) ([place]) 01/26/2017   Encounter for postoperative carotid endarterectomy surveillance 09/21/2015   Occlusion and stenosis of carotid artery without mention of cerebral infarction 02/05/2012    10:47 AM, 11/11/20 Jerene Pitch, DPT Physical Therapy with [facility]  [phone] office   [place], [place], [address] Phone: [phone]   Fax:  [phone]  Name: [name] MRN: [id number] Date of Birth: Mar 09, 1934

## 2020-11-11 NOTE — Patient Instructions (Signed)
Sunday Monday Tuesday Wednesday Thursday Friday Saturday   Week 1 2 minutes x10/day 2 minutes x10/day 2 minutes x10/day 3 minutes  X7/day 2 minutes x10/day 3 minutes  X7/day 3 minutes  X7/day  Week 2 3 minutes  X8/day 3 minutes  X8/day 4 minutes  6x/day 3 minutes  X9/day 4 minutes  6x/day 4 minutes  6x/day 3 minutes  X10/day  Week 3 3 minutes  X10/day 4 minutes  7x/day 4 minutes  7x/day 4 minutes  7x/day 4 minutes  7x/day 5 minutes 5x/day 5 minutes 5x/day  Week 4 5 minutes 5x/day 5 minutes 5x/day 4 minutes  8x/day 4 minutes  8x/day 5 minutes 5x/day 5 minutes 5x/day 5 minutes 6x/day  Week 5 5 minutes 6x/day 5 minutes 6x/day 5 minutes 6x/day 5 minutes 6x/day 5 minutes 6x/day 6 minutes 5x/day 6 minutes 5x/day  Week 6 6 minutes 5x/day 6 minutes 5x/day 6 minutes 5x/day 6 minutes 5x/day 6 minutes 5x/day 6 minutes 5x/day 6 minutes 5x/day  Week 7         Week 8

## 2020-11-15 ENCOUNTER — Encounter (HOSPITAL_COMMUNITY): Payer: Medicare Other | Admitting: Physical Therapy

## 2020-11-17 ENCOUNTER — Encounter (HOSPITAL_COMMUNITY): Payer: Medicare Other | Admitting: Physical Therapy

## 2020-11-30 DIAGNOSIS — Z1389 Encounter for screening for other disorder: Secondary | ICD-10-CM | POA: Diagnosis not present

## 2020-11-30 DIAGNOSIS — I700 Atherosclerosis of aorta: Secondary | ICD-10-CM | POA: Diagnosis not present

## 2020-11-30 DIAGNOSIS — Z0000 Encounter for general adult medical examination without abnormal findings: Secondary | ICD-10-CM | POA: Diagnosis not present

## 2020-11-30 DIAGNOSIS — I129 Hypertensive chronic kidney disease with stage 1 through stage 4 chronic kidney disease, or unspecified chronic kidney disease: Secondary | ICD-10-CM | POA: Diagnosis not present

## 2020-11-30 DIAGNOSIS — E781 Pure hyperglyceridemia: Secondary | ICD-10-CM | POA: Diagnosis not present

## 2020-11-30 DIAGNOSIS — I480 Paroxysmal atrial fibrillation: Secondary | ICD-10-CM | POA: Diagnosis not present

## 2020-11-30 DIAGNOSIS — R7301 Impaired fasting glucose: Secondary | ICD-10-CM | POA: Diagnosis not present

## 2020-11-30 DIAGNOSIS — K5289 Other specified noninfective gastroenteritis and colitis: Secondary | ICD-10-CM | POA: Diagnosis not present

## 2020-11-30 DIAGNOSIS — I779 Disorder of arteries and arterioles, unspecified: Secondary | ICD-10-CM | POA: Diagnosis not present

## 2020-11-30 DIAGNOSIS — Z23 Encounter for immunization: Secondary | ICD-10-CM | POA: Diagnosis not present

## 2020-11-30 DIAGNOSIS — N183 Chronic kidney disease, stage 3 unspecified: Secondary | ICD-10-CM | POA: Diagnosis not present

## 2020-11-30 DIAGNOSIS — M1990 Unspecified osteoarthritis, unspecified site: Secondary | ICD-10-CM | POA: Diagnosis not present

## 2021-05-19 DIAGNOSIS — I480 Paroxysmal atrial fibrillation: Secondary | ICD-10-CM | POA: Diagnosis not present

## 2021-05-19 DIAGNOSIS — K219 Gastro-esophageal reflux disease without esophagitis: Secondary | ICD-10-CM | POA: Diagnosis not present

## 2021-05-19 DIAGNOSIS — M8580 Other specified disorders of bone density and structure, unspecified site: Secondary | ICD-10-CM | POA: Diagnosis not present

## 2021-05-19 DIAGNOSIS — N183 Chronic kidney disease, stage 3 unspecified: Secondary | ICD-10-CM | POA: Diagnosis not present

## 2021-05-19 DIAGNOSIS — E781 Pure hyperglyceridemia: Secondary | ICD-10-CM | POA: Diagnosis not present

## 2021-05-31 DIAGNOSIS — I480 Paroxysmal atrial fibrillation: Secondary | ICD-10-CM | POA: Diagnosis not present

## 2021-05-31 DIAGNOSIS — K219 Gastro-esophageal reflux disease without esophagitis: Secondary | ICD-10-CM | POA: Diagnosis not present

## 2021-05-31 DIAGNOSIS — D6869 Other thrombophilia: Secondary | ICD-10-CM | POA: Diagnosis not present

## 2021-05-31 DIAGNOSIS — N183 Chronic kidney disease, stage 3 unspecified: Secondary | ICD-10-CM | POA: Diagnosis not present

## 2021-05-31 DIAGNOSIS — I779 Disorder of arteries and arterioles, unspecified: Secondary | ICD-10-CM | POA: Diagnosis not present

## 2021-05-31 DIAGNOSIS — N3281 Overactive bladder: Secondary | ICD-10-CM | POA: Diagnosis not present

## 2021-05-31 DIAGNOSIS — M1990 Unspecified osteoarthritis, unspecified site: Secondary | ICD-10-CM | POA: Diagnosis not present

## 2021-05-31 DIAGNOSIS — K5289 Other specified noninfective gastroenteritis and colitis: Secondary | ICD-10-CM | POA: Diagnosis not present

## 2021-05-31 DIAGNOSIS — I129 Hypertensive chronic kidney disease with stage 1 through stage 4 chronic kidney disease, or unspecified chronic kidney disease: Secondary | ICD-10-CM | POA: Diagnosis not present

## 2021-05-31 DIAGNOSIS — I700 Atherosclerosis of aorta: Secondary | ICD-10-CM | POA: Diagnosis not present

## 2021-05-31 DIAGNOSIS — E781 Pure hyperglyceridemia: Secondary | ICD-10-CM | POA: Diagnosis not present

## 2021-12-04 DIAGNOSIS — I10 Essential (primary) hypertension: Secondary | ICD-10-CM | POA: Diagnosis not present

## 2021-12-04 DIAGNOSIS — J069 Acute upper respiratory infection, unspecified: Secondary | ICD-10-CM | POA: Diagnosis not present

## 2021-12-14 ENCOUNTER — Observation Stay (HOSPITAL_COMMUNITY): Payer: Medicare (Managed Care)

## 2021-12-14 ENCOUNTER — Emergency Department (HOSPITAL_COMMUNITY): Payer: Medicare (Managed Care)

## 2021-12-14 ENCOUNTER — Inpatient Hospital Stay (HOSPITAL_COMMUNITY)
Admission: EM | Admit: 2021-12-14 | Discharge: 2021-12-20 | DRG: 066 | Disposition: A | Discharge status: Rehabilitation Facility | Payer: Medicare (Managed Care) | Attending: Family Medicine | Admitting: Family Medicine

## 2021-12-14 ENCOUNTER — Encounter (HOSPITAL_COMMUNITY): Payer: Self-pay | Admitting: Emergency Medicine

## 2021-12-14 ENCOUNTER — Other Ambulatory Visit: Payer: Self-pay

## 2021-12-14 ENCOUNTER — Other Ambulatory Visit (HOSPITAL_COMMUNITY): Payer: Medicare (Managed Care)

## 2021-12-14 DIAGNOSIS — Z96651 Presence of right artificial knee joint: Secondary | ICD-10-CM | POA: Diagnosis present

## 2021-12-14 DIAGNOSIS — R296 Repeated falls: Secondary | ICD-10-CM | POA: Diagnosis present

## 2021-12-14 DIAGNOSIS — Z880 Allergy status to penicillin: Secondary | ICD-10-CM

## 2021-12-14 DIAGNOSIS — I63541 Cerebral infarction due to unspecified occlusion or stenosis of right cerebellar artery: Secondary | ICD-10-CM | POA: Diagnosis present

## 2021-12-14 DIAGNOSIS — D72829 Elevated white blood cell count, unspecified: Secondary | ICD-10-CM | POA: Diagnosis not present

## 2021-12-14 DIAGNOSIS — I639 Cerebral infarction, unspecified: Secondary | ICD-10-CM | POA: Diagnosis not present

## 2021-12-14 DIAGNOSIS — Z9049 Acquired absence of other specified parts of digestive tract: Secondary | ICD-10-CM | POA: Diagnosis not present

## 2021-12-14 DIAGNOSIS — I482 Chronic atrial fibrillation, unspecified: Secondary | ICD-10-CM | POA: Diagnosis not present

## 2021-12-14 DIAGNOSIS — Z8249 Family history of ischemic heart disease and other diseases of the circulatory system: Secondary | ICD-10-CM

## 2021-12-14 DIAGNOSIS — Z881 Allergy status to other antibiotic agents status: Secondary | ICD-10-CM | POA: Diagnosis not present

## 2021-12-14 DIAGNOSIS — I480 Paroxysmal atrial fibrillation: Secondary | ICD-10-CM | POA: Diagnosis not present

## 2021-12-14 DIAGNOSIS — N179 Acute kidney failure, unspecified: Secondary | ICD-10-CM | POA: Diagnosis not present

## 2021-12-14 DIAGNOSIS — E876 Hypokalemia: Secondary | ICD-10-CM | POA: Diagnosis present

## 2021-12-14 DIAGNOSIS — N3289 Other specified disorders of bladder: Secondary | ICD-10-CM | POA: Diagnosis not present

## 2021-12-14 DIAGNOSIS — R42 Dizziness and giddiness: Secondary | ICD-10-CM

## 2021-12-14 DIAGNOSIS — R29700 NIHSS score 0: Secondary | ICD-10-CM | POA: Diagnosis present

## 2021-12-14 DIAGNOSIS — Z7901 Long term (current) use of anticoagulants: Secondary | ICD-10-CM | POA: Diagnosis not present

## 2021-12-14 DIAGNOSIS — R112 Nausea with vomiting, unspecified: Secondary | ICD-10-CM | POA: Diagnosis not present

## 2021-12-14 DIAGNOSIS — Z90710 Acquired absence of both cervix and uterus: Secondary | ICD-10-CM

## 2021-12-14 DIAGNOSIS — R404 Transient alteration of awareness: Secondary | ICD-10-CM | POA: Diagnosis not present

## 2021-12-14 DIAGNOSIS — E785 Hyperlipidemia, unspecified: Secondary | ICD-10-CM | POA: Diagnosis not present

## 2021-12-14 DIAGNOSIS — I779 Disorder of arteries and arterioles, unspecified: Secondary | ICD-10-CM

## 2021-12-14 DIAGNOSIS — M545 Low back pain, unspecified: Secondary | ICD-10-CM | POA: Diagnosis not present

## 2021-12-14 DIAGNOSIS — R5381 Other malaise: Secondary | ICD-10-CM | POA: Diagnosis not present

## 2021-12-14 DIAGNOSIS — I4891 Unspecified atrial fibrillation: Secondary | ICD-10-CM | POA: Diagnosis not present

## 2021-12-14 DIAGNOSIS — I10 Essential (primary) hypertension: Secondary | ICD-10-CM | POA: Diagnosis present

## 2021-12-14 DIAGNOSIS — G8929 Other chronic pain: Secondary | ICD-10-CM | POA: Diagnosis not present

## 2021-12-14 DIAGNOSIS — Z602 Problems related to living alone: Secondary | ICD-10-CM | POA: Diagnosis not present

## 2021-12-14 DIAGNOSIS — J449 Chronic obstructive pulmonary disease, unspecified: Secondary | ICD-10-CM | POA: Diagnosis not present

## 2021-12-14 DIAGNOSIS — F419 Anxiety disorder, unspecified: Secondary | ICD-10-CM | POA: Diagnosis not present

## 2021-12-14 DIAGNOSIS — M48061 Spinal stenosis, lumbar region without neurogenic claudication: Secondary | ICD-10-CM | POA: Diagnosis not present

## 2021-12-14 DIAGNOSIS — I69398 Other sequelae of cerebral infarction: Secondary | ICD-10-CM | POA: Diagnosis not present

## 2021-12-14 DIAGNOSIS — Z87891 Personal history of nicotine dependence: Secondary | ICD-10-CM

## 2021-12-14 DIAGNOSIS — Z7982 Long term (current) use of aspirin: Secondary | ICD-10-CM

## 2021-12-14 DIAGNOSIS — E871 Hypo-osmolality and hyponatremia: Secondary | ICD-10-CM | POA: Diagnosis not present

## 2021-12-14 DIAGNOSIS — Z743 Need for continuous supervision: Secondary | ICD-10-CM | POA: Diagnosis not present

## 2021-12-14 DIAGNOSIS — I6523 Occlusion and stenosis of bilateral carotid arteries: Secondary | ICD-10-CM | POA: Diagnosis not present

## 2021-12-14 DIAGNOSIS — N1330 Unspecified hydronephrosis: Secondary | ICD-10-CM | POA: Diagnosis not present

## 2021-12-14 DIAGNOSIS — Z79899 Other long term (current) drug therapy: Secondary | ICD-10-CM | POA: Diagnosis not present

## 2021-12-14 DIAGNOSIS — H5509 Other forms of nystagmus: Secondary | ICD-10-CM | POA: Diagnosis present

## 2021-12-14 DIAGNOSIS — I959 Hypotension, unspecified: Secondary | ICD-10-CM | POA: Diagnosis not present

## 2021-12-14 DIAGNOSIS — S22080A Wedge compression fracture of T11-T12 vertebra, initial encounter for closed fracture: Secondary | ICD-10-CM | POA: Diagnosis not present

## 2021-12-14 DIAGNOSIS — M792 Neuralgia and neuritis, unspecified: Secondary | ICD-10-CM | POA: Diagnosis not present

## 2021-12-14 DIAGNOSIS — R110 Nausea: Secondary | ICD-10-CM | POA: Diagnosis not present

## 2021-12-14 DIAGNOSIS — I771 Stricture of artery: Secondary | ICD-10-CM | POA: Diagnosis not present

## 2021-12-14 DIAGNOSIS — T699XXA Effect of reduced temperature, unspecified, initial encounter: Secondary | ICD-10-CM | POA: Diagnosis not present

## 2021-12-14 DIAGNOSIS — R6889 Other general symptoms and signs: Secondary | ICD-10-CM | POA: Diagnosis not present

## 2021-12-14 DIAGNOSIS — I499 Cardiac arrhythmia, unspecified: Secondary | ICD-10-CM | POA: Diagnosis not present

## 2021-12-14 DIAGNOSIS — G319 Degenerative disease of nervous system, unspecified: Secondary | ICD-10-CM | POA: Diagnosis not present

## 2021-12-14 DIAGNOSIS — M47816 Spondylosis without myelopathy or radiculopathy, lumbar region: Secondary | ICD-10-CM | POA: Diagnosis not present

## 2021-12-14 DIAGNOSIS — K5900 Constipation, unspecified: Secondary | ICD-10-CM | POA: Diagnosis not present

## 2021-12-14 HISTORY — DX: Spondylosis without myelopathy or radiculopathy, lumbar region: M47.816

## 2021-12-14 LAB — PROTIME-INR
INR: 1.1 (ref 0.8–1.2)
Prothrombin Time: 14.3 seconds (ref 11.4–15.2)

## 2021-12-14 LAB — CBC WITH DIFFERENTIAL/PLATELET
Abs Immature Granulocytes: 0.04 10*3/uL (ref 0.00–0.07)
Basophils Absolute: 0.1 10*3/uL (ref 0.0–0.1)
Basophils Relative: 1 %
Eosinophils Absolute: 0.1 10*3/uL (ref 0.0–0.5)
Eosinophils Relative: 1 %
HCT: 43.7 % (ref 36.0–46.0)
Hemoglobin: 14.7 g/dL (ref 12.0–15.0)
Immature Granulocytes: 0 %
Lymphocytes Relative: 16 %
Lymphs Abs: 1.8 10*3/uL (ref 0.7–4.0)
MCH: 28.4 pg (ref 26.0–34.0)
MCHC: 33.6 g/dL (ref 30.0–36.0)
MCV: 84.5 fL (ref 80.0–100.0)
Monocytes Absolute: 0.4 10*3/uL (ref 0.1–1.0)
Monocytes Relative: 4 %
Neutro Abs: 8.4 10*3/uL — ABNORMAL HIGH (ref 1.7–7.7)
Neutrophils Relative %: 78 %
Platelets: 301 10*3/uL (ref 150–400)
RBC: 5.17 MIL/uL — ABNORMAL HIGH (ref 3.87–5.11)
RDW: 13.8 % (ref 11.5–15.5)
WBC: 10.8 10*3/uL — ABNORMAL HIGH (ref 4.0–10.5)
nRBC: 0 % (ref 0.0–0.2)

## 2021-12-14 LAB — I-STAT CHEM 8, ED
BUN: 12 mg/dL (ref 8–23)
Calcium, Ion: 1.11 mmol/L — ABNORMAL LOW (ref 1.15–1.40)
Chloride: 103 mmol/L (ref 98–111)
Creatinine, Ser: 0.9 mg/dL (ref 0.44–1.00)
Glucose, Bld: 177 mg/dL — ABNORMAL HIGH (ref 70–99)
HCT: 42 % (ref 36.0–46.0)
Hemoglobin: 14.3 g/dL (ref 12.0–15.0)
Potassium: 2.6 mmol/L — CL (ref 3.5–5.1)
Sodium: 142 mmol/L (ref 135–145)
TCO2: 26 mmol/L (ref 22–32)

## 2021-12-14 LAB — COMPREHENSIVE METABOLIC PANEL
ALT: 16 U/L (ref 0–44)
AST: 28 U/L (ref 15–41)
Albumin: 4.2 g/dL (ref 3.5–5.0)
Alkaline Phosphatase: 90 U/L (ref 38–126)
Anion gap: 16 — ABNORMAL HIGH (ref 5–15)
BUN: 12 mg/dL (ref 8–23)
CO2: 23 mmol/L (ref 22–32)
Calcium: 9.5 mg/dL (ref 8.9–10.3)
Chloride: 102 mmol/L (ref 98–111)
Creatinine, Ser: 0.95 mg/dL (ref 0.44–1.00)
GFR, Estimated: 59 mL/min — ABNORMAL LOW (ref >60)
Glucose, Bld: 177 mg/dL — ABNORMAL HIGH (ref 70–99)
Potassium: 2.8 mmol/L — ABNORMAL LOW (ref 3.5–5.1)
Sodium: 141 mmol/L (ref 135–145)
Total Bilirubin: 0.6 mg/dL (ref 0.3–1.2)
Total Protein: 8.2 g/dL — ABNORMAL HIGH (ref 6.5–8.1)

## 2021-12-14 LAB — MAGNESIUM: Magnesium: 1.7 mg/dL (ref 1.7–2.4)

## 2021-12-14 LAB — URINALYSIS, ROUTINE W REFLEX MICROSCOPIC
Bacteria, UA: NONE SEEN
Bilirubin Urine: NEGATIVE
Glucose, UA: NEGATIVE mg/dL
Ketones, ur: NEGATIVE mg/dL
Leukocytes,Ua: NEGATIVE
Nitrite: NEGATIVE
Protein, ur: NEGATIVE mg/dL
Specific Gravity, Urine: 1.014 (ref 1.005–1.030)
pH: 7 (ref 5.0–8.0)

## 2021-12-14 LAB — LIPASE, BLOOD: Lipase: 28 U/L (ref 11–51)

## 2021-12-14 LAB — RAPID URINE DRUG SCREEN, HOSP PERFORMED
Amphetamines: NOT DETECTED
Barbiturates: NOT DETECTED
Benzodiazepines: NOT DETECTED
Cocaine: NOT DETECTED
Opiates: NOT DETECTED
Tetrahydrocannabinol: NOT DETECTED

## 2021-12-14 LAB — APTT: aPTT: 34 seconds (ref 24–36)

## 2021-12-14 MED ORDER — SODIUM CHLORIDE 0.9 % IV SOLN: Freq: Once | INTRAVENOUS | Status: AC

## 2021-12-14 MED ORDER — TRIMETHOBENZAMIDE HCL 100 MG/ML IM SOLN
200.0000 mg | Freq: Four times a day (QID) | INTRAMUSCULAR | Status: DC | PRN
  Administered 2021-12-15: 200 mg via INTRAMUSCULAR
  Filled 2021-12-14 (×2): qty 2

## 2021-12-14 MED ORDER — POTASSIUM CHLORIDE 10 MEQ/100ML IV SOLN
10.0000 meq | INTRAVENOUS | Status: AC
  Administered 2021-12-14 (×3): 10 meq via INTRAVENOUS
  Filled 2021-12-14 (×3): qty 100

## 2021-12-14 MED ORDER — SENNOSIDES-DOCUSATE SODIUM 8.6-50 MG PO TABS: 1.0000 | ORAL_TABLET | Freq: Every evening | ORAL | Status: DC | PRN

## 2021-12-14 MED ORDER — IOHEXOL 350 MG/ML SOLN
75.0000 mL | Freq: Once | INTRAVENOUS | Status: AC | PRN
  Administered 2021-12-14: 75 mL via INTRAVENOUS

## 2021-12-14 MED ORDER — POTASSIUM CHLORIDE CRYS ER 20 MEQ PO TBCR
60.0000 meq | EXTENDED_RELEASE_TABLET | ORAL | Status: AC
  Administered 2021-12-14: 60 meq via ORAL
  Filled 2021-12-14: qty 3

## 2021-12-14 MED ORDER — ACETAMINOPHEN 160 MG/5ML PO SOLN: 650.0000 mg | ORAL | Status: DC | PRN

## 2021-12-14 MED ORDER — ENOXAPARIN SODIUM 40 MG/0.4ML IJ SOSY
40.0000 mg | PREFILLED_SYRINGE | INTRAMUSCULAR | Status: DC
  Administered 2021-12-14 – 2021-12-15 (×2): 40 mg via SUBCUTANEOUS
  Filled 2021-12-14 (×2): qty 0.4

## 2021-12-14 MED ORDER — ASPIRIN 81 MG PO CHEW
81.0000 mg | CHEWABLE_TABLET | Freq: Every day | ORAL | Status: DC
  Administered 2021-12-14 – 2021-12-15 (×2): 81 mg via ORAL
  Filled 2021-12-14 (×2): qty 1

## 2021-12-14 MED ORDER — ACETAMINOPHEN 650 MG RE SUPP: 650.0000 mg | RECTAL | Status: DC | PRN

## 2021-12-14 MED ORDER — ACETAMINOPHEN 325 MG PO TABS
650.0000 mg | ORAL_TABLET | ORAL | Status: DC | PRN
  Administered 2021-12-14 – 2021-12-18 (×7): 650 mg via ORAL
  Filled 2021-12-14 (×7): qty 2

## 2021-12-14 MED ORDER — ONDANSETRON HCL 4 MG/2ML IJ SOLN
4.0000 mg | Freq: Once | INTRAMUSCULAR | Status: AC
  Administered 2021-12-14: 4 mg via INTRAVENOUS
  Filled 2021-12-14: qty 2

## 2021-12-14 MED ORDER — CLOPIDOGREL BISULFATE 75 MG PO TABS
75.0000 mg | ORAL_TABLET | Freq: Every day | ORAL | Status: DC
  Administered 2021-12-14: 75 mg via ORAL
  Filled 2021-12-14: qty 1

## 2021-12-14 MED ORDER — LORAZEPAM 2 MG/ML IJ SOLN
0.5000 mg | Freq: Four times a day (QID) | INTRAMUSCULAR | Status: AC | PRN
  Administered 2021-12-14: 0.5 mg via INTRAVENOUS
  Filled 2021-12-14: qty 1

## 2021-12-14 MED ORDER — LORAZEPAM 2 MG/ML IJ SOLN
0.5000 mg | Freq: Once | INTRAMUSCULAR | Status: AC
  Administered 2021-12-14: 0.5 mg via INTRAVENOUS
  Filled 2021-12-14: qty 1

## 2021-12-14 MED ORDER — STROKE: EARLY STAGES OF RECOVERY BOOK
Freq: Once | Status: AC
  Filled 2021-12-14: qty 1

## 2021-12-14 NOTE — Plan of Care (Signed)
  Problem: Education: Goal: Knowledge of disease or condition will improve Outcome: Progressing   Problem: Ischemic Stroke/TIA Tissue Perfusion: Goal: Complications of ischemic stroke/TIA will be minimized Outcome: Progressing   Problem: Coping: Goal: Will verbalize positive feelings about self Outcome: Progressing   Problem: Health Behavior/Discharge Planning: Goal: Ability to manage health-related needs will improve Outcome: Progressing   Problem: Self-Care: Goal: Ability to participate in self-care as condition permits will improve Outcome: Progressing Goal: Verbalization of feelings and concerns over difficulty with self-care will improve Outcome: Progressing Goal: Ability to communicate needs accurately will improve Outcome: Progressing   Problem: Nutrition: Goal: Risk of aspiration will decrease Outcome: Progressing Goal: Dietary intake will improve Outcome: Progressing   Problem: Education: Goal: Knowledge of General Education information will improve Description: Including pain rating scale, medication(s)/side effects and non-pharmacologic comfort measures Outcome: Progressing   Problem: Health Behavior/Discharge Planning: Goal: Ability to manage health-related needs will improve Outcome: Progressing   Problem: Clinical Measurements: Goal: Ability to maintain clinical measurements within normal limits will improve Outcome: Progressing Goal: Will remain free from infection Outcome: Progressing Goal: Diagnostic test results will improve Outcome: Progressing Goal: Cardiovascular complication will be avoided Outcome: Progressing   Problem: Activity: Goal: Risk for activity intolerance will decrease Outcome: Progressing   Problem: Nutrition: Goal: Adequate nutrition will be maintained Outcome: Progressing   Problem: Coping: Goal: Level of anxiety will decrease Outcome: Progressing   Problem: Elimination: Goal: Will not experience complications related  to bowel motility Outcome: Progressing Goal: Will not experience complications related to urinary retention Outcome: Progressing   Problem: Pain Managment: Goal: General experience of comfort will improve Outcome: Progressing   Problem: Safety: Goal: Ability to remain free from injury will improve Outcome: Progressing   Problem: Skin Integrity: Goal: Risk for impaired skin integrity will decrease Outcome: Progressing

## 2021-12-14 NOTE — ED Provider Notes (Addendum)
[facility] EMERGENCY DEPARTMENT Provider Note   CSN: [id number] Arrival date & time: 12/14/21  7353     History  Chief Complaint  Patient presents with   Emesis    [name] is a 87 y.o. female.  HPI Patient past medical history paroxysmal atrial fibrillation, not anticoagulated, COPD, diverticulitis, hypertension.  Prior surgical history of carotid endarterectomy 2010.  Patient ports he felt well yesterday.  She drove herself to lunch with friends and did some shopping.  She reports she ate a light dinner because she was not hungry but did not feel unwell.  She went to bed at about 1045 feeling well.  Patient reports she awakened at 3 AM feeling very unwell.  She was extremely dizzy and felt unsteady to get out of bed.  She felt very nauseated.  No body aches.  No headache.  She did not specifically notice problems with her vision which is double vision or loss of vision.  She did not perceive that one side of her body was more weak or numb compared to the other. No CP or shortness of breath.    Home Medications Prior to Admission medications   Medication Sig Start Date End Date Taking? Authorizing Provider  acetaminophen (TYLENOL) 500 MG tablet Take 1,000 mg by mouth every 6 (six) hours as needed for mild pain, moderate pain, fever or headache.    [provider]  acetaminophen (TYLENOL) 650 MG CR tablet 2 tablet 03/27/16   [provider]  amLODipine (NORVASC) 10 MG tablet Take 1 tablet (10 mg total) by mouth daily. 02/15/20   Lewayne Bunting, MD  aspirin EC 81 MG tablet Take 1 tablet (81 mg total) by mouth daily. 03/24/18   Lewayne Bunting, MD  atenolol (TENORMIN) 25 MG tablet Take 25 mg by mouth daily with breakfast.  07/27/14   [provider]  calcium carbonate (OSCAL) 1500 (600 Ca) MG TABS tablet 1 tablet with meals    [provider]  Cholecalciferol (VITAMIN D) 50 MCG (2000 UT) tablet 1 tablet    [provider]   diclofenac Sodium (VOLTAREN) 1 % GEL Apply 2 g topically 4 (four) times daily. 04/28/19   Cristie Hem, PA-C  LORazepam (ATIVAN) 1 MG tablet Take 0.5-1 mg by mouth 2 (two) times daily. Takes 0.5mg  right when she gets up for the day and then a little while later she takes 1mg     [provider]  losartan (COZAAR) 100 MG tablet Take 100 mg by mouth daily with breakfast.     [provider]  omeprazole (PRILOSEC) 40 MG capsule Take 40 mg by mouth daily. 10/16/19   [provider]  oxybutynin (DITROPAN) 5 MG tablet Take 1 tablet by mouth every morning. 06/10/17   [provider]  rosuvastatin (CRESTOR) 20 MG tablet TAKE 1 TABLET BY MOUTH  DAILY 03/08/20   03/10/20, MD  sertraline (ZOLOFT) 100 MG tablet Take 100 mg by mouth at bedtime.     [provider]  sodium chloride (OCEAN) 0.65 % SOLN nasal spray Place 1 spray into both nostrils 2 (two) times daily.    [provider]      Allergies    Amoxicillin and Tetracyclines & related    Review of Systems   Review of Systems  Physical Exam Updated Vital Signs BP (!) 165/104   Pulse 94   Resp 16   Ht 5' 4.5" (1.638 m)   Wt  69 kg   SpO2 99%   BMI 25.71 kg/m  Physical Exam Constitutional:      Comments: Patient is alert and oriented x3.  She is uncomfortable and nauseated in appearance.  Well-nourished well-developed good physical condition for age  HENT:     Head: Normocephalic and atraumatic.     Mouth/Throat:     Mouth: Mucous membranes are dry.     Pharynx: Oropharynx is clear.  Eyes:     Extraocular Movements: Extraocular movements intact.     Pupils: Pupils are equal, round, and reactive to light.     Comments: Subtle lateral nystagmus bilaterally.  Cardiovascular:     Comments: Irregularly irregular.  No rub murmur gallop. Pulmonary:     Effort: Pulmonary effort is normal.     Breath sounds: Normal breath sounds.  Abdominal:     General: There is no distension.      Palpations: Abdomen is soft.     Tenderness: There is no abdominal tenderness. There is no guarding.  Musculoskeletal:        General: No swelling. Normal range of motion.     Cervical back: Neck supple.     Right lower leg: No edema.     Left lower leg: No edema.  Skin:    General: Skin is warm and dry.  Neurological:     Comments: Patient is alert and oriented x3.  Answers questions appropriate.  Follows commands appropriately.  No evident cranial nerve deficit.  She can perform finger-nose exam bilaterally but does have slight past-pointing inconsistency that is symmetric.  She is able to compensate and adjust for this with focus.  No pronator drift.  Bilateral upper strength grip push pull 5\5.  Patient can elevate both lower extremities off of the bed and hold against gravity.  Subjectively she perceived some additional heaviness to the right lower extremity.     ED Results / Procedures / Treatments   Labs (all labs ordered are listed, but only abnormal results are displayed) Labs Reviewed  CBC WITH DIFFERENTIAL/PLATELET - Abnormal; Notable for the following components:      Result Value   WBC 10.8 (*)    RBC 5.17 (*)    Neutro Abs 8.4 (*)    All other components within normal limits  COMPREHENSIVE METABOLIC PANEL - Abnormal; Notable for the following components:   Potassium 2.8 (*)    Glucose, Bld 177 (*)    Total Protein 8.2 (*)    GFR, Estimated 59 (*)    Anion gap 16 (*)    All other components within normal limits  I-STAT CHEM 8, ED - Abnormal; Notable for the following components:   Potassium 2.6 (*)    Glucose, Bld 177 (*)    Calcium, Ion 1.11 (*)    All other components within normal limits  LIPASE, BLOOD  PROTIME-INR  APTT  URINALYSIS, ROUTINE W REFLEX MICROSCOPIC  RAPID URINE DRUG SCREEN, HOSP PERFORMED    EKG EKG Interpretation  Date/Time:  Thursday December 14 2021 06:28:44 EST Ventricular Rate:  102 PR Interval:    QRS Duration: 95 QT  Interval:  429 QTC Calculation: 559 R Axis:   58 Text Interpretation: Atrial fibrillation Nonspecific ST depression repeat within 2 minutes no change Confirmed by Arby Barrette [phone]) on 12/14/2021 8:09:03 AM  Radiology No results found.  Procedures Procedures   CRITICAL CARE Performed by: Arby Barrette   Total critical care time: 30 minutes  Critical care time was exclusive of separately  billable procedures and treating other patients.  Critical care was necessary to treat or prevent imminent or life-threatening deterioration.  Critical care was time spent personally by me on the following activities: development of treatment plan with patient and/or surrogate as well as nursing, discussions with consultants, evaluation of patient's response to treatment, examination of patient, obtaining history from patient or surrogate, ordering and performing treatments and interventions, ordering and review of laboratory studies, ordering and review of radiographic studies, pulse oximetry and re-evaluation of patient's condition.  Medications Ordered in ED Medications  potassium chloride 10 mEq in 100 mL IVPB (10 mEq Intravenous New Bag/Given 12/14/21 0826)  LORazepam (ATIVAN) injection 0.5 mg (has no administration in time range)  0.9 %  sodium chloride infusion ( Intravenous New Bag/Given 12/14/21 0720)  ondansetron (ZOFRAN) injection 4 mg (4 mg Intravenous Given 12/14/21 0721)  iohexol (OMNIPAQUE) 350 MG/ML injection 75 mL (75 mLs Intravenous Contrast Given 12/14/21 0820)    ED Course/ Medical Decision Making/ A&P                           Medical Decision Making Amount and/or Complexity of Data Reviewed Labs: ordered. Radiology: ordered.  Risk Prescription drug management. Decision regarding hospitalization.   Patient well yesterday.  Acute onset of severe vertiginous symptoms in the middle of the night.  Differential diagnosis includes benign peripheral vertigo, central  vertigo, cerebellar stroke.  With known paroxysmal atrial fibrillation and not anticoagulated, high risk for cerebellar stroke.  We will proceed with stroke work-up.  Patient is out of time window for tPA.  Last known well from patient's history is 10:45 PM.  No LVO symptoms.  Will initiate treatment with Zofran and fluids for nausea.  Consult: Reviewed with Dr. Amada Jupiter at this time we will proceed with CT angio head and neck and anticipate admission for stroke work-up.  I-STAT chemistry returns with a potassium of 2.7.  Will initiate IV potassium replacement.  Monitor reviewed.  Patient has atrial fibrillation narrow complex rates in the 70s to 80s.  Review of EMR indicates prior history of paroxysmal atrial fibrillation.  At this time no intervention needed for rate control.  08: 20 Dr. Amada Jupiter has reviewed CT angiograms and has high suspicion for small cerebellar CVA noninterventional.  Recommends adding Ativan for control of severe vertigo and planning for admission and MRI.  Consult placed for [facility] hospitalist admission.  08: 52 Dr. Katrinka Blazing accepts for admission.       Final Clinical Impression(s) / ED Diagnoses Final diagnoses:  Vertigo  Intractable nausea and vomiting  Paroxysmal atrial fibrillation (HCC)  Hypokalemia    Rx / DC Orders ED Discharge Orders     None         Arby Barrette, MD 12/14/21 1937    Arby Barrette, MD 12/14/21 [id number]

## 2021-12-14 NOTE — H&P (Addendum)
History and Physical    Patient: Lori PontoCarol S Mckimmy YNW:[id number] DOB: [birth year] DOA: 12/14/2021 DOS: the patient was seen and examined on 12/14/2021 PCP: Blair HeysEhinger, Robert, MD  Patient coming from: Home via EMS  Chief Complaint:  Chief Complaint  Patient presents with   Emesis   HPI: [name] is a 87 y.o. female with medical history significant of hypertension, paroxysmal atrial fibrillation not on anticoagulation, left carotid artery stenosis s/p endarterectomy, diastolic CHF, COPD, and small bowel obstruction who presented with complaints of dizziness and nausea that started this morning at 3 am. She states that she was woken out of her sleep with the symptoms and was unable to get herself out of bed.  Unable to describe dizziness in further detail at this time.  She reported having several episodes of vomiting.  Emesis was noted to be of stomach contents and denies any blood present.  Denies any significant abdominal pain.  She had gone to bed yesterday evening in her normal state of health prior to midnight. She reports remote history of being on Coumadin, but stopped taking the medicine due to her sister previously almost bleeding out while on the medication.  To her knowledge she had not eaten anything out of the norm.  Patient does make note that she fell over someone sometime last week and hit her head, but denied sustaining any significant injury.  In route with EMS patient was noted to have an irregular heart rhythm 120s, blood pressure 180/100, and all other vital signs maintained.     In the emergency department patient was noted to be in atrial fibrillation with blood pressures elevated up to 172/91, and O2 saturations maintained on room air.  Labs significant for WBC 10.8 (appears chronic), potassium 2.8, and glucose 177.  CTA of the head and neck noted no large vessel occlusion, but complex plaque in the left ICA origin, and concern for right cervical ICA fibromuscular dysplasia.   Patient has been given Ativan, Zofran,  potassium chloride 30 mEq IV, and 1 L of normal saline IV fluids.   Review of Systems: As mentioned in the history of present illness. All other systems reviewed and are negative. Past Medical History:  Diagnosis Date   Arthritis    Atrial fibrillation (HCC)    Carotid artery occlusion    Colitis    COPD (chronic obstructive pulmonary disease) (HCC)    Diverticulitis    Hypertension    Keratosis, seborrheic    Past Surgical History:  Procedure Laterality Date   ABDOMINAL HYSTERECTOMY     BREAST EXCISIONAL BIOPSY Left    BREAST EXCISIONAL BIOPSY Left    BREAST EXCISIONAL BIOPSY Right    BREAST SURGERY     cyst removal   CAROTID ENDARTERECTOMY Left 2010   CHOLECYSTECTOMY     EYE SURGERY Bilateral 06/2014   cataracts   JOINT REPLACEMENT     RIGHT KNEE   REPLACEMENT TOTAL KNEE Right    Social History:  reports that she quit smoking about 19 years ago. Her smoking use included cigarettes. She has a 75.00 pack-year smoking history. She has never used smokeless tobacco. She reports that she does not drink alcohol and does not use drugs.  Allergies  Allergen Reactions   Amoxicillin Nausea Only and Rash    Has patient had a PCN reaction causing immediate rash, facial/tongue/throat swelling, SOB or lightheadedness with hypotension: Yes Has patient had a PCN reaction causing severe rash involving mucus membranes or skin necrosis: No Has  patient had a PCN reaction that required hospitalization No Has patient had a PCN reaction occurring within the last 10 years: No If all of the above answers are "NO", then may proceed with Cephalosporin use.    Tetracyclines & Related Nausea Only and Rash    Family History  Problem Relation Age of Onset   Heart disease Mother        After age 26   AAA (abdominal aortic aneurysm) Sister    AAA (abdominal aortic aneurysm) Brother    Heart attack Brother    Breast cancer Cousin    Breast cancer Other     Breast cancer Other     Prior to Admission medications   Medication Sig Start Date End Date Taking? Authorizing Provider  acetaminophen (TYLENOL) 500 MG tablet Take 1,000 mg by mouth every 6 (six) hours as needed for mild pain, moderate pain, fever or headache.    [provider]  acetaminophen (TYLENOL) 650 MG CR tablet 2 tablet 03/27/16   [provider]  amLODipine (NORVASC) 10 MG tablet Take 1 tablet (10 mg total) by mouth daily. 02/15/20   Lewayne Bunting, MD  aspirin EC 81 MG tablet Take 1 tablet (81 mg total) by mouth daily. 03/24/18   Lewayne Bunting, MD  atenolol (TENORMIN) 25 MG tablet Take 25 mg by mouth daily with breakfast.  07/27/14   [provider]  calcium carbonate (OSCAL) 1500 (600 Ca) MG TABS tablet 1 tablet with meals    [provider]  Cholecalciferol (VITAMIN D) 50 MCG (2000 UT) tablet 1 tablet    [provider]  diclofenac Sodium (VOLTAREN) 1 % GEL Apply 2 g topically 4 (four) times daily. 04/28/19   Cristie Hem, PA-C  LORazepam (ATIVAN) 1 MG tablet Take 0.5-1 mg by mouth 2 (two) times daily. Takes 0.5mg  right when she gets up for the day and then a little while later she takes 1mg     [provider]  losartan (COZAAR) 100 MG tablet Take 100 mg by mouth daily with breakfast.     [provider]  omeprazole (PRILOSEC) 40 MG capsule Take 40 mg by mouth daily. 10/16/19   [provider]  oxybutynin (DITROPAN) 5 MG tablet Take 1 tablet by mouth every morning. 06/10/17   [provider]  rosuvastatin (CRESTOR) 20 MG tablet TAKE 1 TABLET BY MOUTH  DAILY 03/08/20   03/10/20, MD  sertraline (ZOLOFT) 100 MG tablet Take 100 mg by mouth at bedtime.     [provider]  sodium chloride (OCEAN) 0.65 % SOLN nasal spray Place 1 spray into both nostrils 2 (two) times daily.    [provider]    Physical Exam: Vitals:   12/14/21 0631 12/14/21 0632 12/14/21 0730 12/14/21 0830   BP: (!) 172/91  (!) 162/85 (!) 165/104  Pulse: 89  (!) 103 94  Resp: 18  18 16   SpO2: 94%  98% 99%  Weight:  69 kg    Height:  5' 4.5" (1.638 m)     Exam  Constitutional: Elderly female who appears to be in some distress vomiting Eyes: PERRL, lids and conjunctivae normal ENMT: Mucous membranes are moist.  Neck: normal, supple Respiratory: clear to auscultation bilaterally, no wheezing, no crackles. Normal respiratory effort. No accessory muscle use.  Cardiovascular: Irregular irregular and tachycardic. No extremity edema. 2+ pedal pulses. Abdomen: no tenderness, no masses palpated. No hepatosplenomegaly. Bowel sounds positive.  Musculoskeletal: no clubbing / cyanosis.  No joint deformity upper and lower extremities. Good ROM, no contractures.  Skin: no rashes, lesions, ulcers. No induration Neurologic: CN 2-12 grossly intact.  Strength 4/5 Psychiatric: Normal judgment and insight. Alert and oriented x 3. Normal mood.   Data Reviewed:  EKG revealed atrial fibrillation at 93 bpm.  Reviewed labs, imaging and pertinent records as noted above in HPI  Assessment and Plan:  CVA  Acute.  Patient reports waking up acutely this morning at 3 AM with complaints of dizziness with nausea and vomiting.  CT angiogram of the head and neck did not note any acute abnormality.  Neurology has been consulted and MRI of the brain did not reveal any acute signs of a stroke, but neurology felt patient actually had a tiny right cerebellar stroke. -Admit to a telemetry bed -Neurochecks -Check lipid panel and hemoglobin A1c -Check echocardiogram -PT/OT to evaluate and treat -Start aspirin once able -Appreciate neurology consultative services,  will follow-up for any further recommendations  Nausea and vomiting Acute.  Thought secondary to dizziness.  Patient reported Ativan had helped.  Review of records note prior history of SBO. -Aspiration precautions with elevation of head of the bed -Check acute  abdominal series -Ativan IV as needed for nausea  Paroxysmal atrial fibrillation Patient appears to be in atrial fibrillation and has been rate controlled with atenolol.  She had previously been on Coumadin, but stopped taking the medication after her sister had a significant bleeding event while on the medication.  Patient was just on aspirin.  CHA2DS2-VASc score =6-7.  Family is concerned with her being on blood thinners that she is alone and has frequent falls. -Goal potassium at least 4 and magnesium at least 2.  Replace electrolytes as needed. -Continue atenolol once able.  Low-dose metoprolol IV as needed -Check Echocardiogram  Hypokalemia Acute.  Potassium noted to be low at 2.8.  Patient has been ordered potassium chloride 30 meq IV.  Suspect secondary to nausea and vomiting. -Check magnesium level -Order for patient to be given potassium chloride 60 mill equivalents but not sure if any of this was absorbed as patient had episodes of vomiting. -Continue to monitor and replace as needed  Leukocytosis Chronic.  WBC 10.8 and may be reactive in nature to above, but appears to have been elevated on several checks when looking back to prior years. -Continue to monitor  Essential hypertension Blood pressures currently maintained. -Resume home blood pressure medicines once medically appropriate  Anxiety -Continue Ativan IV and transition back to p.o. once able  Carotid artery disease CTA of the head and neck noted complex plaque in the left ICA origin which was a difficult to exclude adherent thrombus within the vessel lumen and evidence of cervical right ICA fibromuscular dysplasia. -Continue aspirin and statin once able  Hyperlipidemia -Follow-up lipid panel -Continue atorvastatin once able  Frequent falls Patient reports just recently falling last week.  Family concerned that if she were to be placed on anticoagulation as she lives alone and does not always let them know when  she falls. -PT/OT to evaluate  DVT prophylaxis: Lovenox Advance Care Planning:   Code Status: Full Code   Consults: Neurology  Family Communication: Niece updated at bedside  Severity of Illness: The appropriate patient status for this patient is OBSERVATION. Observation status is judged to be reasonable and necessary in order to provide the required intensity of service to ensure the patient's safety. The patient's presenting symptoms, physical exam findings, and initial radiographic and laboratory data in  the context of their medical condition is felt to place them at decreased risk for further clinical deterioration. Furthermore, it is anticipated that the patient will be medically stable for discharge from the hospital within 2 midnights of admission.   Author: Clydie Braun, MD 12/14/2021 9:18 AM  For on call review [email or web address].

## 2021-12-14 NOTE — ED Notes (Signed)
Patient transported to MRI 

## 2021-12-14 NOTE — ED Notes (Signed)
ED TO INPATIENT HANDOFF REPORT  ED Nurse Name and Phone #: Duwayne Heck [phone]  S Name/Age/Gender [name] 87 y.o. female Room/Bed: 030C/030C  Code Status   Code Status: Full Code  Home/SNF/Other Home Patient oriented to: self, place, situation and time Is this baseline? Yes   Triage Complete: Triage complete  Chief Complaint Vertigo [R42]  Triage Note Patient BIB [facility] c/o dizziness and nausea after waking up.  Patient has new onset afib per EMS.  Patient denies chest pain and abd pain.  4 mg zofran 180/100 100-120 HR irregular 188 CBG 98% RA 18 L AC     Allergies Allergies  Allergen Reactions   Amoxicillin Nausea Only and Rash    Has patient had a PCN reaction causing immediate rash, facial/tongue/throat swelling, SOB or lightheadedness with hypotension: Yes Has patient had a PCN reaction causing severe rash involving mucus membranes or skin necrosis: No Has patient had a PCN reaction that required hospitalization No Has patient had a PCN reaction occurring within the last 10 years: No If all of the above answers are "NO", then may proceed with Cephalosporin use.    Tetracyclines & Related Nausea Only and Rash    Level of Care/Admitting Diagnosis ED Disposition     ED Disposition  Admit   Condition  --   Comment  Hospital Area: [facility] [[id number]]  Level of Care: Telemetry Medical [104]  May place patient in observation at [facility] or [facility] if equivalent level of care is available:: No  Covid Evaluation: Asymptomatic - no recent exposure (last 10 days) testing not required  Diagnosis: Vertigo [207257]  Admitting Physician: Clydie Braun [[id number]]  Attending Physician: Clydie Braun [[id number]]          B Medical/Surgery History Past Medical History:  Diagnosis Date   Arthritis    Atrial fibrillation (HCC)    Carotid artery occlusion    Colitis    COPD (chronic obstructive pulmonary disease) (HCC)     Diverticulitis    Hypertension    Keratosis, seborrheic    Past Surgical History:  Procedure Laterality Date   ABDOMINAL HYSTERECTOMY     BREAST EXCISIONAL BIOPSY Left    BREAST EXCISIONAL BIOPSY Left    BREAST EXCISIONAL BIOPSY Right    BREAST SURGERY     cyst removal   CAROTID ENDARTERECTOMY Left 2010   CHOLECYSTECTOMY     EYE SURGERY Bilateral 06/2014   cataracts   JOINT REPLACEMENT     RIGHT KNEE   REPLACEMENT TOTAL KNEE Right      A IV Location/Drains/Wounds Patient Lines/Drains/Airways Status     Active Line/Drains/Airways     Name Placement date Placement time Site Days   Peripheral IV 12/14/21 18 G Left Antecubital 12/14/21  0600  Antecubital  less than 1            Intake/Output Last 24 hours  Intake/Output Summary (Last 24 hours) at 12/14/2021 1459 Last data filed at 12/14/2021 1320 Gross per 24 hour  Intake 531.09 ml  Output --  Net 531.09 ml    Labs/Imaging Results for orders placed or performed during the hospital encounter of 12/14/21 (from the past 48 hour(s))  CBC with Differential     Status: Abnormal   Collection Time: 12/14/21  6:29 AM  Result Value Ref Range   WBC 10.8 (H) 4.0 - 10.5 K/uL   RBC 5.17 (H) 3.87 - 5.11 MIL/uL   Hemoglobin 14.7 12.0 - 15.0 g/dL  HCT 43.7 36.0 - 46.0 %   MCV 84.5 80.0 - 100.0 fL   MCH 28.4 26.0 - 34.0 pg   MCHC 33.6 30.0 - 36.0 g/dL   RDW 16.1 09.6 - 04.5 %   Platelets 301 150 - 400 K/uL   nRBC 0.0 0.0 - 0.2 %   Neutrophils Relative % 78 %   Neutro Abs 8.4 (H) 1.7 - 7.7 K/uL   Lymphocytes Relative 16 %   Lymphs Abs 1.8 0.7 - 4.0 K/uL   Monocytes Relative 4 %   Monocytes Absolute 0.4 0.1 - 1.0 K/uL   Eosinophils Relative 1 %   Eosinophils Absolute 0.1 0.0 - 0.5 K/uL   Basophils Relative 1 %   Basophils Absolute 0.1 0.0 - 0.1 K/uL   Immature Granulocytes 0 %   Abs Immature Granulocytes 0.04 0.00 - 0.07 K/uL    Comment: Performed at [facility], [address], [place], [place] [address]   Comprehensive metabolic panel     Status: Abnormal   Collection Time: 12/14/21  6:29 AM  Result Value Ref Range   Sodium 141 135 - 145 mmol/L   Potassium 2.8 (L) 3.5 - 5.1 mmol/L   Chloride 102 98 - 111 mmol/L   CO2 23 22 - 32 mmol/L   Glucose, Bld 177 (H) 70 - 99 mg/dL    Comment: Glucose reference range applies only to samples taken after fasting for at least 8 hours.   BUN 12 8 - 23 mg/dL   Creatinine, Ser 1.91 0.44 - 1.00 mg/dL   Calcium 9.5 8.9 - 47.8 mg/dL   Total Protein 8.2 (H) 6.5 - 8.1 g/dL   Albumin 4.2 3.5 - 5.0 g/dL   AST 28 15 - 41 U/L   ALT 16 0 - 44 U/L   Alkaline Phosphatase 90 38 - 126 U/L   Total Bilirubin 0.6 0.3 - 1.2 mg/dL   GFR, Estimated 59 (L) >60 mL/min    Comment: (NOTE) Calculated using the CKD-EPI Creatinine Equation (2021)    Anion gap 16 (H) 5 - 15    Comment: Performed at [facility], [address], [place], [place] [address]  Lipase, blood     Status: None   Collection Time: 12/14/21  6:29 AM  Result Value Ref Range   Lipase 28 11 - 51 U/L    Comment: Performed at [facility], [address] [place], [place] [address]  Protime-INR     Status: None   Collection Time: 12/14/21  6:29 AM  Result Value Ref Range   Prothrombin Time 14.3 11.4 - 15.2 seconds   INR 1.1 0.8 - 1.2    Comment: (NOTE) INR goal varies based on device and disease states. Performed at [facility], [address], [place], [place] [address]   APTT     Status: None   Collection Time: 12/14/21  6:29 AM  Result Value Ref Range   aPTT 34 24 - 36 seconds    Comment: Performed at [facility], [address], [place], [place] [address]  I-stat chem 8, ED     Status: Abnormal   Collection Time: 12/14/21  7:45 AM  Result Value Ref Range   Sodium 142 135 - 145 mmol/L   Potassium 2.6 (LL) 3.5 - 5.1 mmol/L   Chloride 103 98 - 111 mmol/L   BUN 12 8 - 23 mg/dL   Creatinine, Ser 2.84 0.44 - 1.00 mg/dL  Glucose, Bld 177 (H) 70 - 99 mg/dL     Comment: Glucose reference range applies only to samples taken after fasting for at least 8 hours.   Calcium, Ion 1.11 (L) 1.15 - 1.40 mmol/L   TCO2 26 22 - 32 mmol/L   Hemoglobin 14.3 12.0 - 15.0 g/dL   HCT 59.2 92.4 - 46.2 %   Comment NOTIFIED PHYSICIAN   Urinalysis, Routine w reflex microscopic Urine, Clean Catch     Status: Abnormal   Collection Time: 12/14/21  8:35 AM  Result Value Ref Range   Color, Urine STRAW (A) YELLOW   APPearance CLEAR CLEAR   Specific Gravity, Urine 1.014 1.005 - 1.030   pH 7.0 5.0 - 8.0   Glucose, UA NEGATIVE NEGATIVE mg/dL   Hgb urine dipstick SMALL (A) NEGATIVE   Bilirubin Urine NEGATIVE NEGATIVE   Ketones, ur NEGATIVE NEGATIVE mg/dL   Protein, ur NEGATIVE NEGATIVE mg/dL   Nitrite NEGATIVE NEGATIVE   Leukocytes,Ua NEGATIVE NEGATIVE   RBC / HPF 0-5 0 - 5 RBC/hpf   Bacteria, UA NONE SEEN NONE SEEN    Comment: Performed at [facility], [address], [place], [place] [address]  Urine rapid drug screen (hosp performed)     Status: None   Collection Time: 12/14/21  8:35 AM  Result Value Ref Range   Opiates NONE DETECTED NONE DETECTED   Cocaine NONE DETECTED NONE DETECTED   Benzodiazepines NONE DETECTED NONE DETECTED   Amphetamines NONE DETECTED NONE DETECTED   Tetrahydrocannabinol NONE DETECTED NONE DETECTED   Barbiturates NONE DETECTED NONE DETECTED    Comment: (NOTE) DRUG SCREEN FOR MEDICAL PURPOSES ONLY.  IF CONFIRMATION IS NEEDED FOR ANY PURPOSE, NOTIFY LAB WITHIN 5 DAYS.  LOWEST DETECTABLE LIMITS FOR URINE DRUG SCREEN Drug Class                     Cutoff (ng/mL) Amphetamine and metabolites    1000 Barbiturate and metabolites    200 Benzodiazepine                 200 Opiates and metabolites        300 Cocaine and metabolites        300 THC                            50 Performed at [facility], [address] [place], [place] [address]   Magnesium     Status: None   Collection Time: 12/14/21  1:47 PM  Result Value Ref  Range   Magnesium 1.7 1.7 - 2.4 mg/dL    Comment: Performed at [facility], [address], [place], [place] [address]   DG ABD ACUTE 2+V W 1V CHEST  Result Date: 12/14/2021 CLINICAL DATA:  Nausea vomiting. EXAM: DG ABDOMEN ACUTE WITH 1 VIEW CHEST COMPARISON:  Chest x-ray of July 05, 2017. FINDINGS: EKG leads project over the chest. Cardiomediastinal contours and hilar structures are normal. Lungs are clear. No pneumothorax. No pleural effusion. No free air beneath either the RIGHT or LEFT hemidiaphragm. Moderate to marked RIGHT and moderate LEFT hydronephrosis as exhibited by excreted recently administered contrast material in the urinary tract. Urinary bladder is markedly distended and partially filled with dilute contrast media as well. No signs of bowel obstruction.  Post cholecystectomy. Near complete loss of height of T12. Age indeterminate but new since previous imaging. IMPRESSION: 1. No acute cardiopulmonary disease.  2. Findings that suggest bladder outlet obstruction or urinary retention as evidence by the marked distension of the urinary bladder and bilateral hydronephrosis RIGHT greater than LEFT. Correlate clinically and consider Foley catheter placement and are dedicated abdominal imaging. 3. T12 compression fracture suggestive of vertebra plana. Acuity uncertain. Correlate with any signs of pain in this area. This could be a cause for urinary retention. Electronically Signed   By: Donzetta Kohut M.D.   On: 12/14/2021 12:39   MR Brain Wo Contrast (neuro protocol)  Result Date: 12/14/2021 CLINICAL DATA:  Acute onset of severe dizziness and nausea. EXAM: MRI HEAD WITHOUT CONTRAST TECHNIQUE: Multiplanar, multiecho pulse sequences of the brain and surrounding structures were obtained without intravenous contrast. COMPARISON:  CTA head and neck 12/14/2021 FINDINGS: Brain: No acute infarct, hemorrhage, or mass lesion is present. Mild atrophy and white matter changes are within normal  limits for age. Deep brain nuclei are within normal limits. The ventricles are of normal size. No significant extraaxial fluid collection is present. The internal auditory canals are within normal limits. The brainstem and cerebellum are within normal limits. Vascular: Flow is present in the major intracranial arteries. Skull and upper cervical spine: The craniocervical junction is normal. Upper cervical spine is within normal limits. Marrow signal is unremarkable. Right supraorbital soft tissue swelling is new since the prior exam. Sinuses/Orbits: Minimal fluid is present in the mastoid air cells bilaterally. No obstructing nasopharyngeal lesion is present. The paranasal sinuses and mastoid air cells are otherwise clear. Bilateral lens replacements are noted. Globes and orbits are otherwise unremarkable. IMPRESSION: 1. Normal MRI appearance of the brain for age. No acute or focal lesion to explain the patient's symptoms. 2. Right supraorbital soft tissue swelling is new since the prior exam. Electronically Signed   By: Marin Roberts M.D.   On: 12/14/2021 09:31   CT ANGIO HEAD NECK W WO CM  Result Date: 12/14/2021 CLINICAL DATA:  87 year old female with dizziness and nausea since 0300 hours. EXAM: CT ANGIOGRAPHY HEAD AND NECK TECHNIQUE: Multidetector CT imaging of the head and neck was performed using the standard protocol during bolus administration of intravenous contrast. Multiplanar CT image reconstructions and MIPs were obtained to evaluate the vascular anatomy. Carotid stenosis measurements (when applicable) are obtained utilizing NASCET criteria, using the distal internal carotid diameter as the denominator. RADIATION DOSE REDUCTION: This exam was performed according to the departmental dose-optimization program which includes automated exposure control, adjustment of the mA and/or kV according to patient size and/or use of iterative reconstruction technique. CONTRAST:  75mL OMNIPAQUE IOHEXOL 350  MG/ML SOLN COMPARISON:  Head CT 03/25/2018. FINDINGS: CT HEAD Brain: Cerebral volume remains within normal limits for age. No midline shift, mass effect, or evidence of intracranial mass lesion. No ventriculomegaly. No acute intracranial hemorrhage identified. Gray-white matter differentiation appears stable since 2020 and within normal limits for age throughout the brain. Calvarium and skull base: No acute osseous abnormality identified. Paranasal sinuses: Tympanic cavities, Visualized paranasal sinuses and mastoids are clear. Orbits: Mild rightward gaze. No other acute orbit or scalp soft tissue finding. CTA NECK Skeleton: Chronic severe craniocervical and atlantoaxial degeneration with bulky osteophytosis and subchondral erosions. Superimposed cervical facet ankylosis at C3-C4. Advanced lower cervical disc and endplate degeneration. No acute osseous abnormality identified. Upper chest: Emphysema. Visible upper thoracic esophagus is mildly distended with fluid and gas. Otherwise negative visible superior mediastinum. Other neck: Cervical esophagus mildly gas distended. Other neck soft tissue spaces appear within normal limits. Aortic arch: Porch wrists aortic arch.  The entire arch is not included. Probable three-vessel configuration. Distal arch tortuosity, estimated at 33 mm diameter which is dilated but nonaneurysmal. Right carotid system: Visible brachiocephalic artery with mild plaque and no stenosis. Mildly tortuous right CCA origin with no plaque. Calcified plaque at the right carotid bifurcation primarily affects the ECA. Tortuous right ICA with a mildly beaded appearance (series 15, image 19), but no stenosis to the skull base. Left carotid system: Left CCA origin not included. Left CCA patent without stenosis proximal to the bifurcation. At the left carotid bifurcation there is complex plaque with combined 60% origin stenosis and evidence of ulcerated plaque or adherent thrombus in the proximal ICA bulb.  See series 12, image 87. A carotid web might also explain this appearance, but the lesion has an unusual trefoil shape. Additional plaque at the left ICA bulb, but no other stenosis to the skull base. Vertebral arteries: Tortuous proximal right subclavian artery of mild plaque and no stenosis. Normal right vertebral artery origin. Tortuous right V1 segment. Tortuous right vertebral artery with no plaque or stenosis to the skull base. Proximal left subclavian artery soft and calcified plaque with mild stenosis. Normal left vertebral artery origin. Tortuous left vertebral artery is codominant and patent to the skull base with no plaque or stenosis. CTA HEAD Posterior circulation: Codominant distal vertebral arteries and patent vertebrobasilar junction without stenosis. Patent right PICA and dominant appearing left AICA origins. Patent basilar artery without stenosis. Patent SCA and PCA origins. Posterior communicating arteries are diminutive or absent. Bilateral PCA branches are within normal limits. Anterior circulation: Both ICA siphons are patent with tortuosity and mild to moderate calcified plaque, but only mild distal right cavernous segment stenosis. Patent carotid termini. Normal MCA and ACA origins. Normal anterior communicating artery and bilateral ACA branches. Left MCA M1 segment and trifurcation are patent without stenosis. Right MCA M1 segment and bifurcation are patent without stenosis. Bilateral MCA branches are within normal limits. Venous sinuses: Early contrast timing, not well evaluated. Anatomic variants: None. Review of the MIP images confirms the above findings IMPRESSION: 1. Negative for large vessel occlusion, but Positive for complex plaque at the Left ICA origin where it's difficult to exclude ADHERENT THROMBUS within the vessel lumen (series 12, image 87, but alternatively this appearance might be ulcerated plaque and/or an unusual carotid web. Subsequent stenosis numerically estimated at  60%. 2. Positive also for evidence of cervical Right ICA Fibromuscular Dysplasia (FMD). 3. But otherwise mild for age atherosclerosis, and essentially negative intracranial anterior circulation with no other arterial stenosis identified. 4. Stable CT appearance of the brain, No acute intracranial abnormality. 5. Aortic Atherosclerosis (ICD10-I70.0) and Emphysema (ICD10-J43.9). Tortuous aortic arch. 6. 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 Electronically Signed   By: Odessa Fleming M.D.   On: 12/14/2021 08:42    Pending Labs Unresulted Labs (From admission, onward)     Start     Ordered   12/15/21 0500  Hemoglobin A1c  Tomorrow morning,   R        12/14/21 1320   12/15/21 0500  Lipid panel  Tomorrow morning,   R        12/14/21 1320            Vitals/Pain Today's Vitals   12/14/21 1029 12/14/21 1030 12/14/21 1245 12/14/21 1428  BP:  (!) 153/95 (!) 154/116   Pulse:  94 (!) 102   Resp:  12 14   Temp: 97.6 F (36.4 C)   97.7 F (  36.5 C)  TempSrc: Oral   Oral  SpO2:  100% 96%   Weight:      Height:      PainSc:        Isolation Precautions No active isolations  Medications Medications   stroke: early stages of recovery book (has no administration in time range)  acetaminophen (TYLENOL) tablet 650 mg (has no administration in time range)    Or  acetaminophen (TYLENOL) 160 MG/5ML solution 650 mg (has no administration in time range)    Or  acetaminophen (TYLENOL) suppository 650 mg (has no administration in time range)  senna-docusate (Senokot-S) tablet 1 tablet (has no administration in time range)  enoxaparin (LOVENOX) injection 40 mg (40 mg Subcutaneous Given 12/14/21 1051)  aspirin chewable tablet 81 mg (81 mg Oral Given 12/14/21 1050)  trimethobenzamide (TIGAN) injection 200 mg (has no administration in time range)  0.9 %  sodium chloride infusion (0 mLs Intravenous Stopped 12/14/21 1052)  ondansetron (ZOFRAN) injection 4 mg (4 mg  Intravenous Given 12/14/21 0721)  potassium chloride 10 mEq in 100 mL IVPB (0 mEq Intravenous Stopped 12/14/21 1320)  iohexol (OMNIPAQUE) 350 MG/ML injection 75 mL (75 mLs Intravenous Contrast Given 12/14/21 0820)  LORazepam (ATIVAN) injection 0.5 mg (0.5 mg Intravenous Given 12/14/21 0833)  potassium chloride SA (KLOR-CON M) CR tablet 60 mEq (60 mEq Oral Given 12/14/21 1050)  0.9 %  sodium chloride infusion ( Intravenous New Bag/Given 12/14/21 1053)  LORazepam (ATIVAN) injection 0.5 mg (0.5 mg Intravenous Given 12/14/21 1140)    Mobility non-ambulatory currently. She uses a cane at home, but due to her ongoing dizziness she has not been up walking at all. High fall risk   Focused Assessments    R Recommendations: See Admitting Provider Note  Report given to:   Additional Notes:

## 2021-12-14 NOTE — ED Notes (Signed)
Pt doseing off and dropping sats into low 80's.  Pt placed on 2L and is now back into the 90's

## 2021-12-14 NOTE — Consult Note (Signed)
Neurology Consultation  Reason for Consult: dizziness and nausea upon awakening Referring Physician: Dr. Donnald Garre  CC: Dizziness and nausea  History is obtained from: Patient and chart  HPI: [name] is a 87 y.o. female with history of hypertension, hyperlipidemia, arthritis, carotid stenosis and carotid endarterectomy on the left in 2010 who presents with severe dizziness and nausea which started when she woke up around 3:00 this morning.  Patient states she was last known normal at 2345 when she went to bed last night.  She has never had symptoms like this before.  She states that when she woke up, she vomited once and has had severe dizziness and nausea, unable to characterize if she feels like she is spinning or if she feels like the room is spinning.     LKW: 2345 on 11/22 TNK given?: no, side of window IR Thrombectomy? No, low suspicion for LVO Modified Rankin Scale: 0-Completely asymptomatic and back to baseline post- stroke  ROS: A complete ROS was performed and is negative except as noted in the HPI.   Past Medical History:  Diagnosis Date   Arthritis    Atrial fibrillation (HCC)    Carotid artery occlusion    Colitis    COPD (chronic obstructive pulmonary disease) (HCC)    Diverticulitis    Hypertension    Keratosis, seborrheic      Family History  Problem Relation Age of Onset   Heart disease Mother        After age 35   AAA (abdominal aortic aneurysm) Sister    AAA (abdominal aortic aneurysm) Brother    Heart attack Brother    Breast cancer Cousin    Breast cancer Other    Breast cancer Other      Social History:   reports that she quit smoking about 19 years ago. Her smoking use included cigarettes. She has a 75.00 pack-year smoking history. She has never used smokeless tobacco. She reports that she does not drink alcohol and does not use drugs.  Medications No current facility-administered medications for this encounter.  Current Outpatient  Medications:    acetaminophen (TYLENOL) 500 MG tablet, Take 1,000 mg by mouth every 6 (six) hours as needed for mild pain, moderate pain, fever or headache., Disp: , Rfl:    acetaminophen (TYLENOL) 650 MG CR tablet, 2 tablet, Disp: , Rfl:    amLODipine (NORVASC) 10 MG tablet, Take 1 tablet (10 mg total) by mouth daily., Disp: 90 tablet, Rfl: 3   aspirin EC 81 MG tablet, Take 1 tablet (81 mg total) by mouth daily., Disp: 90 tablet, Rfl: 3   atenolol (TENORMIN) 25 MG tablet, Take 25 mg by mouth daily with breakfast. , Disp: , Rfl: 3   calcium carbonate (OSCAL) 1500 (600 Ca) MG TABS tablet, 1 tablet with meals, Disp: , Rfl:    Cholecalciferol (VITAMIN D) 50 MCG (2000 UT) tablet, 1 tablet, Disp: , Rfl:    diclofenac Sodium (VOLTAREN) 1 % GEL, Apply 2 g topically 4 (four) times daily., Disp: 150 g, Rfl: 1   LORazepam (ATIVAN) 1 MG tablet, Take 0.5-1 mg by mouth 2 (two) times daily. Takes 0.5mg  right when she gets up for the day and then a little while later she takes 1mg , Disp: , Rfl:    losartan (COZAAR) 100 MG tablet, Take 100 mg by mouth daily with breakfast. , Disp: , Rfl:    omeprazole (PRILOSEC) 40 MG capsule, Take 40 mg by mouth daily., Disp: , Rfl:  oxybutynin (DITROPAN) 5 MG tablet, Take 1 tablet by mouth every morning., Disp: , Rfl:    rosuvastatin (CRESTOR) 20 MG tablet, TAKE 1 TABLET BY MOUTH  DAILY, Disp: 90 tablet, Rfl: 3   sertraline (ZOLOFT) 100 MG tablet, Take 100 mg by mouth at bedtime. , Disp: , Rfl:    sodium chloride (OCEAN) 0.65 % SOLN nasal spray, Place 1 spray into both nostrils 2 (two) times daily., Disp: , Rfl:    Exam: Current vital signs: BP (!) 162/85   Pulse (!) 103   Resp 18   Ht 5' 4.5" (1.638 m)   Wt 69 kg   SpO2 98%   BMI 25.71 kg/m  Vital signs in last 24 hours: Pulse Rate:  [89-103] 103 (11/23 0730) Resp:  [18] 18 (11/23 0730) BP: (162-172)/(85-91) 162/85 (11/23 0730) SpO2:  [94 %-98 %] 98 % (11/23 0730) Weight:  [01 kg] 69 kg (11/23 7510)  GENERAL:  Awake, alert, in mild distress due to nausea Psych: Affect appropriate for situation, patient is calm and cooperative with examination Head: Normocephalic and atraumatic, without obvious abnormality EENT: Normal conjunctivae, dry mucous membranes, no OP obstruction LUNGS: Normal respiratory effort. Non-labored breathing on room air CV: Irregular rhythm noted on monitor Extremities: warm, well perfused, without obvious deformity  NEURO:  Mental Status: Awake, alert, and oriented to person, place, time, and situation. She is able to provide a clear and coherent history of present illness. Speech/Language: speech is clear and fluent.   Fluency, and comprehension intact without aphasia  No neglect is noted Cranial Nerves:  II: PERRL visual fields full.  Leftward nystagmus with rotary component with gaze in all directions and at rest III, IV, VI: EOMI. Lid elevation symmetric and full.  V: Sensation is intact to light touch and symmetrical to face.   VII: Face is symmetric resting and smiling.   VIII: Hearing intact to voice IX, X: Phonation normal.  XI: Normal sternocleidomastoid and trapezius muscle strength XII: Tongue protrudes midline without fasciculations.   Motor: 5/5 strength is all muscle groups.  Tone is normal. Bulk is normal.  Sensation: Intact to light touch bilaterally in all four extremities. No extinction to DSS present.  Coordination: FTN intact bilaterally. HKS intact bilaterally. No pronator drift.   Gait: Deferred  NIHSS: 1a Level of Conscious.:0  1b LOC Questions: 0 1c LOC Commands: 0 2 Best Gaze: 0 3 Visual: 0 4 Facial Palsy: 0 5a Motor Arm - left: 0 5b Motor Arm - Right: 0 6a Motor Leg - Left: 0 6b Motor Leg - Right: 0 7 Limb Ataxia: 0 8 Sensory: 0 9 Best Language: 0 10 Dysarthria: 0 11 Extinct. and Inatten.: 0 TOTAL: 0   Labs I have reviewed labs in epic and the results pertinent to this consultation are:    Latest Ref Rng & Units 12/14/2021     7:45 AM 03/25/2018    5:01 PM 01/30/2017    5:17 AM  BMP  Glucose 70 - 99 mg/dL 258  527  782   BUN 8 - 23 mg/dL 12  8  8    Creatinine 0.44 - 1.00 mg/dL  4.23  5.36   Sodium 135 - 145 mmol/L 142  135  136   Potassium 3.5 - 5.1 mmol/L 2.6  3.5  4.0   Chloride 98 - 111 mmol/L 103  102  105   CO2 22 - 32 mmol/L  22  24   Calcium 8.9 - 10.3 mg/dL  9.0  8.5      Lipid Panel  No results found for: "CHOL", "TRIG", "HDL", "CHOLHDL", "VLDL", "LDLCALC", "LDLDIRECT"   Imaging I have reviewed the images obtained:  CT-scan of the brain: pending  CTA head and neck: pending  MRI examination of the brain: pending  Assessment: 87 year old patient with history of hypertension, hyperlipidemia, arthritis, carotid stenosis status post left carotid endarterectomy presents with nausea, vomiting and dizziness with sudden onset at 3:00 this morning.  Leftward nystagmus with rotary component noted at rest and without gaze in all directions on exam.  Difficult to differentiate if vertigo is central or peripheral.  CTA head and neck shows no LVO.  MRI brain shows tiny right cerebellar stroke.  She has a history of atrial fibrillation, but was not anticoagulated due to "hating it."  Impression: Acute ischemic right cerebellar stroke  Recommendations: Stroke/TIA Workup  - Admit for stroke workup - Permissive HTN x48 hrs from sx onset goal BP <220/110. PRN labetalol or hydralazine if BP above these parameters. Avoid oral antihypertensives. - TTE w/ bubble - Check A1c and LDL + add statin per guidelines - aspirin 300 mg PR until she is able to take p.o. - q4 hr neuro checks - STAT head CT for any change in neuro exam - Tele - PT/OT/SLP - Stroke education - Amb referral to neurology upon discharge    Pt seen by NP/Neuro and later by MD. Note/plan to be edited by MD as needed.  Cortney E Ernestina Columbia , MSN, AGACNP-BC [facility] See Amion for schedule and pager information 12/14/2021 7:43  AM   I have seen the patient and reviewed the above note.  Though the official read is negative, on my review, on images 10-12 of the axial DWI and image 10 of the coronal DWI, there does appear to be increased signal which is dark on ADC.  This is in an area prone to artifact, but is also an area in keeping with her current presentation symptoms.  Given the abrupt onset and her risk factors, I think that this is likely a presentation of an acute ischemic stroke.  She was on Coumadin for atrial fibrillation in the past, but discontinued it after her sister had a hemorrhage.  She has been hesitant to go on anticoagulation due to this.  I suspect that this may need to be revisited.  Ritta Slot, MD [facility] [phone]  If 7pm- 7am, please page neurology on call as listed in AMION.

## 2021-12-14 NOTE — ED Notes (Signed)
Patient returns from MRI

## 2021-12-14 NOTE — ED Triage Notes (Signed)
Patient BIB [facility] c/o dizziness and nausea after waking up.  Patient has new onset afib per EMS.  Patient denies chest pain and abd pain.  4 mg zofran 180/100 100-120 HR irregular 188 CBG 98% RA 18 L AC

## 2021-12-15 ENCOUNTER — Observation Stay (HOSPITAL_COMMUNITY): Payer: Medicare (Managed Care)

## 2021-12-15 ENCOUNTER — Other Ambulatory Visit (HOSPITAL_COMMUNITY): Payer: Self-pay

## 2021-12-15 DIAGNOSIS — I4891 Unspecified atrial fibrillation: Secondary | ICD-10-CM

## 2021-12-15 DIAGNOSIS — R42 Dizziness and giddiness: Secondary | ICD-10-CM | POA: Diagnosis not present

## 2021-12-15 LAB — BASIC METABOLIC PANEL
Anion gap: 15 (ref 5–15)
BUN: 16 mg/dL (ref 8–23)
CO2: 23 mmol/L (ref 22–32)
Calcium: 9 mg/dL (ref 8.9–10.3)
Chloride: 99 mmol/L (ref 98–111)
Creatinine, Ser: 1.37 mg/dL — ABNORMAL HIGH (ref 0.44–1.00)
GFR, Estimated: 38 mL/min — ABNORMAL LOW (ref >60)
Glucose, Bld: 107 mg/dL — ABNORMAL HIGH (ref 70–99)
Potassium: 3.7 mmol/L (ref 3.5–5.1)
Sodium: 137 mmol/L (ref 135–145)

## 2021-12-15 LAB — ECHOCARDIOGRAM COMPLETE
Area-P 1/2: 2.38 cm2
Calc EF: 52 %
Height: 64.5 in
MV M vel: 5.01 m/s
MV Peak grad: 100.4 mmHg
S' Lateral: 2.9 cm
Single Plane A2C EF: 56.6 %
Single Plane A4C EF: 49.7 %
Weight: 2433.88 oz

## 2021-12-15 LAB — LIPID PANEL
Cholesterol: 90 mg/dL (ref 0–200)
HDL: 42 mg/dL (ref >40)
LDL Cholesterol: 27 mg/dL (ref 0–99)
Total CHOL/HDL Ratio: 2.1 RATIO
Triglycerides: 103 mg/dL (ref <150)
VLDL: 21 mg/dL (ref 0–40)

## 2021-12-15 MED ORDER — ALUM & MAG HYDROXIDE-SIMETH 200-200-20 MG/5ML PO SUSP: 30.0000 mL | ORAL | Status: DC

## 2021-12-15 MED ORDER — ALUM & MAG HYDROXIDE-SIMETH 200-200-20 MG/5ML PO SUSP
30.0000 mL | ORAL | Status: AC
  Administered 2021-12-15: 30 mL via ORAL
  Filled 2021-12-15: qty 30

## 2021-12-15 MED ORDER — LIDOCAINE VISCOUS HCL 2 % MT SOLN: 15.0000 mL | Freq: Once | OROMUCOSAL | Status: DC

## 2021-12-15 MED ORDER — PANTOPRAZOLE SODIUM 40 MG IV SOLR
40.0000 mg | INTRAVENOUS | Status: AC
  Administered 2021-12-15: 40 mg via INTRAVENOUS
  Filled 2021-12-15: qty 10

## 2021-12-15 MED ORDER — HYDRALAZINE HCL 20 MG/ML IJ SOLN: 5.0000 mg | Freq: Four times a day (QID) | INTRAMUSCULAR | Status: DC | PRN

## 2021-12-15 MED ORDER — LIDOCAINE VISCOUS HCL 2 % MT SOLN
15.0000 mL | OROMUCOSAL | Status: AC
  Administered 2021-12-15: 15 mL via ORAL
  Filled 2021-12-15: qty 15

## 2021-12-15 MED ORDER — SODIUM CHLORIDE 0.9 % IV SOLN
12.5000 mg | Freq: Four times a day (QID) | INTRAVENOUS | Status: DC | PRN
  Administered 2021-12-15: 12.5 mg via INTRAVENOUS
  Filled 2021-12-15 (×2): qty 0.5

## 2021-12-15 MED ORDER — ROSUVASTATIN CALCIUM 20 MG PO TABS
20.0000 mg | ORAL_TABLET | Freq: Every day | ORAL | Status: DC
  Administered 2021-12-15 – 2021-12-20 (×6): 20 mg via ORAL
  Filled 2021-12-15 (×6): qty 1

## 2021-12-15 MED ORDER — PANTOPRAZOLE SODIUM 40 MG PO TBEC
40.0000 mg | DELAYED_RELEASE_TABLET | Freq: Every day | ORAL | Status: DC
  Administered 2021-12-15 – 2021-12-20 (×6): 40 mg via ORAL
  Filled 2021-12-15 (×6): qty 1

## 2021-12-15 MED ORDER — DICYCLOMINE HCL 10 MG/5ML PO SOLN
10.0000 mg | ORAL | Status: AC
  Administered 2021-12-15: 10 mg via ORAL
  Filled 2021-12-15: qty 5

## 2021-12-15 MED ORDER — PROCHLORPERAZINE EDISYLATE 10 MG/2ML IJ SOLN
5.0000 mg | Freq: Four times a day (QID) | INTRAMUSCULAR | Status: DC | PRN
  Administered 2021-12-15 – 2021-12-19 (×2): 5 mg via INTRAVENOUS
  Filled 2021-12-15 (×2): qty 2

## 2021-12-15 MED ORDER — SERTRALINE HCL 100 MG PO TABS
100.0000 mg | ORAL_TABLET | Freq: Every day | ORAL | Status: DC
  Administered 2021-12-15 – 2021-12-19 (×5): 100 mg via ORAL
  Filled 2021-12-15 (×5): qty 1

## 2021-12-15 MED ORDER — APIXABAN 5 MG PO TABS
5.0000 mg | ORAL_TABLET | Freq: Two times a day (BID) | ORAL | Status: DC
  Administered 2021-12-16 – 2021-12-20 (×9): 5 mg via ORAL
  Filled 2021-12-15 (×9): qty 1

## 2021-12-15 MED ORDER — HYDROMORPHONE HCL 1 MG/ML IJ SOLN
0.5000 mg | INTRAMUSCULAR | Status: AC
  Administered 2021-12-15: 0.5 mg via INTRAVENOUS
  Filled 2021-12-15: qty 0.5

## 2021-12-15 MED ORDER — OXYBUTYNIN CHLORIDE 5 MG PO TABS
5.0000 mg | ORAL_TABLET | Freq: Every day | ORAL | Status: DC
  Administered 2021-12-15 – 2021-12-20 (×6): 5 mg via ORAL
  Filled 2021-12-15 (×6): qty 1

## 2021-12-15 NOTE — Progress Notes (Addendum)
STROKE TEAM PROGRESS NOTE   INTERVAL HISTORY Patient is seen in her room with no family at the bedside.  She reports that her nausea is better than it was yesterday.  Patient presented yesterday with severe, sudden onset dizziness and nausea that started when she woke up in the morning.  MRI shows small right cerebellar stroke.  Vitals:   12/14/21 1959 12/14/21 2332 12/15/21 0738 12/15/21 1132  BP: (!) 145/54 (!) 116/53 (!) 169/66 (!) 159/96  Pulse: 62 66 63 63  Resp: 16 16 18 17   Temp: 98.8 F (37.1 C) 97.9 F (36.6 C) 98.4 F (36.9 C) 98.3 F (36.8 C)  TempSrc: Oral Oral Oral Oral  SpO2: 98% 93% 93% 97%  Weight:      Height:       CBC:  Recent Labs  Lab 12/14/21 0629 12/14/21 0745  WBC 10.8*  --   NEUTROABS 8.4*  --   HGB 14.7 14.3  HCT 43.7 42.0  MCV 84.5  --   PLT 301  --    Basic Metabolic Panel:  Recent Labs  Lab 12/14/21 0629 12/14/21 0745 12/14/21 1347 12/15/21 0743  NA 141 142  --  137  K 2.8* 2.6*  --  3.7  CL 102 103  --  99  CO2 23  --   --  23  GLUCOSE 177* 177*  --  107*  BUN 12 12  --  16  CREATININE 0.95 0.90  --  1.37*  CALCIUM 9.5  --   --  9.0  MG  --   --  1.7  --    Lipid Panel:  Recent Labs  Lab 12/15/21 0244  CHOL 90  TRIG 103  HDL 42  CHOLHDL 2.1  VLDL 21  LDLCALC 27   HgbA1c: No results for input(s): "HGBA1C" in the last 168 hours. Urine Drug Screen:  Recent Labs  Lab 12/14/21 0835  LABOPIA NONE DETECTED  COCAINSCRNUR NONE DETECTED  LABBENZ NONE DETECTED  AMPHETMU NONE DETECTED  THCU NONE DETECTED  LABBARB NONE DETECTED    Alcohol Level No results for input(s): "ETH" in the last 168 hours.  IMAGING past 24 hours DG ABD ACUTE 2+V W 1V CHEST  Result Date: 12/14/2021 CLINICAL DATA:  Nausea vomiting. EXAM: DG ABDOMEN ACUTE WITH 1 VIEW CHEST COMPARISON:  Chest x-ray of July 05, 2017. FINDINGS: EKG leads project over the chest. Cardiomediastinal contours and hilar structures are normal. Lungs are clear. No pneumothorax.  No pleural effusion. No free air beneath either the RIGHT or LEFT hemidiaphragm. Moderate to marked RIGHT and moderate LEFT hydronephrosis as exhibited by excreted recently administered contrast material in the urinary tract. Urinary bladder is markedly distended and partially filled with dilute contrast media as well. No signs of bowel obstruction.  Post cholecystectomy. Near complete loss of height of T12. Age indeterminate but new since previous imaging. IMPRESSION: 1. No acute cardiopulmonary disease. 2. Findings that suggest bladder outlet obstruction or urinary retention as evidence by the marked distension of the urinary bladder and bilateral hydronephrosis RIGHT greater than LEFT. Correlate clinically and consider Foley catheter placement and are dedicated abdominal imaging. 3. T12 compression fracture suggestive of vertebra plana. Acuity uncertain. Correlate with any signs of pain in this area. This could be a cause for urinary retention. Electronically Signed   By: July 07, 2017 M.D.   On: 12/14/2021 12:39    PHYSICAL EXAM General: Alert, well-nourished, well-developed elderly patient in no acute distress Respiratory: Regular, unlabored respirations on  room air  NEURO:  Mental Status: AA&Ox3  Speech/Language: speech is without dysarthria or aphasia.  Naming, fluency, and comprehension intact.  Cranial Nerves:  II: PERRL.   III, IV, VI: EOMI. Eyelids elevate symmetrically. Improved nystagmus at rest and nystagmus with left gaze noted, denies diplopia V: Sensation is intact to light touch and symmetrical to face.  VII: Smile is symmetrical.  VIII: hearing intact to voice. IX, X: Phonation is normal.  XII: tongue is midline without fasciculations. Motor: 5/5 strength to all muscle groups tested.  Tone: is normal and bulk is normal Sensation- Intact to light touch bilaterally.  Coordination: FTN intact bilaterally, HKS: no ataxia in BLE. Gait- deferred   ASSESSMENT/PLAN [name] is a 87 y.o. female with history of hypertension, hyperlipidemia, arthritis, carotid stenosis with left carotid endarterectomy in 2010 and atrial fibrillation without anticoagulation presenting with severe, sudden onset dizziness and nausea that started when she woke up in the morning.  MRI shows small right cerebellar stroke.  Etiology is likely cardioembolic in the setting of atrial fibrillation without anticoagulation.  Patient is amenable to trying Eliquis but is concerned if she can afford it.  Stroke: Small right cerebellar stroke Etiology: Likely cardioembolic in setting of atrial fibrillation off anticoagulation CT head No acute abnormality. Small vessel disease.  CTA head & neck negative for LVO, but complex plaque at the left ICA origin with possible adherent thrombus within the vessel lumen, 60% left ICA stenosis, right cervical ICA fibromuscular dysplasia MRI small right cerebellar stroke 2D Echo EF 55 to 60%, grade 1 diastolic dysfunction, no atrial level shunt LDL 27 HgbA1c No results found for requested labs within last 1095 days. VTE prophylaxis -fully anticoagulated with Eliquis    Diet   Diet clear liquid Room service appropriate? Yes; Fluid consistency: Thin   aspirin 81 mg daily prior to admission, now on Eliquis (apixaban) daily.  Therapy recommendations: Pending Disposition: Pending, likely home  Hypertension Home meds: Amlodipine 10 mg daily, losartan 100 mg daily Stable Permissive hypertension (OK if < 220/120) but gradually normalize in 5-7 days Long-term BP goal normotensive  Hyperlipidemia Home meds: None LDL 27, goal < 70 High intensity statin not indicated as LDL below goal Continue statin at discharge  Atrial fibrillation Patient has history of atrial fibrillation, tried Coumadin in the past but did not like it is open to trying Eliquis Will start anticoagulation with Eliquis Pharmacy to investigate co-pay, as patient has concerns about being able  to afford this medication  Other Stroke Risk Factors Former cigarette smoker  Other Active Problems None  Hospital day # 0  Cortney E Ernestina Columbia , MSN, AGACNP-BC [facility] See Amion for schedule and pager information 12/15/2021 12:46 PM  ATTENDING ATTESTATION:  87 year old with history of A-fib not anticoagulated with sudden onset of dizziness found to have right cerebellar stroke.  Exam is unremarkable except for left gaze nystagmus double vision.  She is amenable to starting Eliquis medication despite some hesitancy with anticoagulation.  Her LDL is at goal.  Follow therapy recommendations.  Can follow-up with Dr. Pearlean Brownie outpatient in stroke clinic.  Neurology will sign off please call with questions.  And signDr. Viviann Spare evaluated pt independently, reviewed imaging, chart, labs. Discussed and formulated plan with the Resident/APP. Changes were made to the note where appropriate. Please see APP/resident note above for details.     Pattye Meda,MD     To contact Stroke Continuity provider, please refer to [email or web address]. After hours, contact  General Neurology

## 2021-12-15 NOTE — Progress Notes (Signed)
Echocardiogram 2D Echocardiogram has been performed.  [name] 12/15/2021, 10:23 AM

## 2021-12-15 NOTE — TOC Benefit Eligibility Note (Signed)
Patient Scientific laboratory technician completed.     The patient is currently admitted and upon discharge could be taking ELIQUIS.   The current 30 day co-pay is, $47.   The patient is insured through [employer].

## 2021-12-15 NOTE — Progress Notes (Signed)
ANTICOAGULATION CONSULT NOTE - Initial Consult  Pharmacy Consult for Apixaban  Indication: atrial fibrillation  Allergies  Allergen Reactions   Amoxicillin Nausea Only and Rash    Has patient had a PCN reaction causing immediate rash, facial/tongue/throat swelling, SOB or lightheadedness with hypotension: Yes Has patient had a PCN reaction causing severe rash involving mucus membranes or skin necrosis: No Has patient had a PCN reaction that required hospitalization No Has patient had a PCN reaction occurring within the last 10 years: No If all of the above answers are "NO", then may proceed with Cephalosporin use.    Tetracyclines & Related Nausea Only and Rash    Patient Measurements: Height: 5' 4.5" (163.8 cm) Weight: 69 kg (152 lb 1.9 oz) IBW/kg (Calculated) : 55.85  Vital Signs: Temp: 98.3 F (36.8 C) (11/24 1132) Temp Source: Oral (11/24 1132) BP: 159/96 (11/24 1132) Pulse Rate: 63 (11/24 1132)  Labs: Recent Labs    12/14/21 0629 12/14/21 0745 12/15/21 0743  HGB 14.7 14.3  --   HCT 43.7 42.0  --   PLT 301  --   --   APTT 34  --   --   LABPROT 14.3  --   --   INR 1.1  --   --   CREATININE 0.95 0.90 1.37*    Estimated Creatinine Clearance: 29 mL/min (A) (by C-G formula based on SCr of 1.37 mg/dL (H)).   Medical History: Past Medical History:  Diagnosis Date   Arthritis    Atrial fibrillation (HCC)    Carotid artery occlusion    Colitis    COPD (chronic obstructive pulmonary disease) (HCC)    Diverticulitis    Hypertension    Keratosis, seborrheic     Medications:  Scheduled:   aspirin  81 mg Oral Daily   enoxaparin (LOVENOX) injection  40 mg Subcutaneous Q24H   oxybutynin  5 mg Oral Daily   pantoprazole  40 mg Oral Daily   rosuvastatin  20 mg Oral Daily   sertraline  100 mg Oral QHS    Assessment: 52 YOF admitted with CVA with history of PAF who was previously on Warfarin, but stopped due to fear of bleeding after her sister experienced  significant bleeding on Warfarin. Her CHA2DS2-VASc score is 6-7. Her Scr today is 1.37,  but baseline is closer to 0.8-0.9. Aspirin d/c'd due to increased bleeding risk.   Goal of Therapy:  Monitor platelets by anticoagulation protocol: Yes   Plan:  Start apixaban 5mg  BID - patient does not currently meet the criteria for reduced dosing in the setting of Afib.  Monitor for s/sx of bleeding  Will follow up to check copay and educate patient   , PharmD  PGY1 Pharmacy Resident

## 2021-12-15 NOTE — Progress Notes (Signed)
Overnight cross coverage:  Received a call from bedside RN regarding the patient having complaints of midsternal epigastric pain, jaw pain and headaches nausea and vomiting.  Per bedside RN, charge nurse has called rapid response.  Vital signs and EKG obtained.  Ordered IV Protonix 40 mg x 1 GI cocktail x 1, and IV Dilaudid 0.5 mg x 1.  Presented at bedside.  The patient is calm, alert and oriented, very hard of hearing.  No chest pain, states her epigastric pain is 4 out of 10 and better.  Blood pressure 149/80, heart rate 73, O2 saturation 95% on room air.  No significant epigastric tenderness on exam.   Will continue to monitor and treat as indicated.  Time: 15 minutes.

## 2021-12-15 NOTE — Significant Event (Signed)
Rapid Response Event Note   Reason for Call :  CP/jaw pain/nausea  Initial Focused Assessment:  Pt lying in bed with eyes closed, in no visible distress. She is alert and oriented, very HOH. Pt c/o 6/10 epigastric pain, B jaw pain, headache, and nausea. Pt has had nausea/vomiting t/o day. Lungs clear t/o. Skin warm and dry.  HR-75, BP-189/78, RR-18, SpO2-96% on RA  Interventions:  EKG-SR Dilaudid 0.5mg  IV Hydralazine 5mg  IV GI cocktail Compazine 5mg  IV Plan of Care:  Initiate above inteventions. Continue to monitor pt closely. Call RRT if further assistance needed.   Event Summary:   MD Notified: Dr. notified by bedside RN PTA RRT Call [phone] Arrival [phone] End Time:2140  TDHR:[id number], RN

## 2021-12-15 NOTE — Evaluation (Addendum)
Physical Therapy Evaluation Patient Details Name: [name] MRN: [id number] DOB: Sep 19, 1934 Today's Date: 12/15/2021  History of Present Illness  [name] is a 87 y.o. female with medical history significant of hypertension, paroxysmal atrial fibrillation not on anticoagulation, left carotid artery stenosis s/p endarterectomy, diastolic CHF, COPD, and small bowel obstruction who presented with complaints of dizziness and nausea and found to have small cerebellar CVA.  Clinical Impression  Patient presents with decreased mobility due to generalized weakness with N&V, decreased balance, decreased upright orientation and vestibular impairment and dizziness.  She  was previously independent in AD/IADL using cane, but currently mod A for mobility to bathroom with increased time, cues for visual compensation and pt incontinence.  She will benefit from skilled PT In the acute setting and may need follow up up STSNF level rehab at d/c.  Vestibular exam to follow, limited to mobility today due to importance to mobilize, but pt too symptomatic after mobility to complete vestibular exam.     Recommendations for follow up therapy are one component of a multi-disciplinary discharge planning process, led by the attending physician.  Recommendations may be updated based on patient status, additional functional criteria and insurance authorization.  Follow Up Recommendations Skilled nursing-short term rehab (<3 hours/day) Can patient physically be transported by private vehicle: No    Assistance Recommended at Discharge Frequent or constant Supervision/Assistance  Patient can return home with the following  A little help with walking and/or transfers;Assistance with cooking/housework;Assist for transportation;Assistance with feeding;Help with stairs or ramp for entrance;Direct supervision/assist for financial management    Equipment Recommendations Rolling walker (2 wheels)  Recommendations for Other  Services       Functional Status Assessment Patient has had a recent decline in their functional status and demonstrates the ability to make significant improvements in function in a reasonable and predictable amount of time.     Precautions / Restrictions Precautions Precautions: Fall Precaution Comments: dizziness      Mobility  Bed Mobility Overal bed mobility: Needs Assistance Bed Mobility: Supine to Sit     Supine to sit: HOB elevated, Min assist     General bed mobility comments: assist for balance, cues for visual target due to dizziness    Transfers Overall transfer level: Needs assistance Equipment used: Rolling walker (2 wheels) Transfers: Sit to/from Stand Sit to Stand: Mod assist           General transfer comment: lifting help and cues for visual target due to dizziness    Ambulation/Gait Ambulation/Gait assistance: Mod assist Gait Distance (Feet): 14 Feet (x 2 bathroom and back to bed) Assistive device: Rolling walker (2 wheels) Gait Pattern/deviations: Decreased stride length, Step-through pattern, Trunk flexed, Wide base of support       General Gait Details: cues throughout for visual target to help reduce symptoms and imbalance, needing mod support for balance and walker safety to and from bathroom with pt incontinent along the way  Stairs            Wheelchair Mobility    Modified Rankin (Stroke Patients Only) Modified Rankin (Stroke Patients Only) Pre-Morbid Rankin Score: No significant disability Modified Rankin: Moderately severe disability     Balance Overall balance assessment: Needs assistance Sitting-balance support: Feet supported Sitting balance-Leahy Scale: Fair Sitting balance - Comments: on EOB with S for balance, no LOB   Standing balance support: Bilateral upper extremity supported Standing balance-Leahy Scale: Poor Standing balance comment: UE support and min A for balance  Pertinent Vitals/Pain Pain Assessment Pain Assessment: Faces Faces Pain Scale: Hurts little more Pain Location: headache Pain Descriptors / Indicators: Headache Pain Intervention(s): Monitored during session, Repositioned, Patient requesting pain meds-RN notified    Home Living Family/patient expects to be discharged to:: Private residence Living Arrangements: Alone Available Help at Discharge: Family;Available 24 hours/day (states neice or step daughter could stay) Type of Home: House Home Access: Stairs to enter Entrance Stairs-Rails: Lawyer of Steps: 3   Home Layout: One level Home Equipment: Agricultural consultant (2 wheels);Cane - single point      Prior Function Prior Level of Function : Independent/Modified Independent             Mobility Comments: uses cane intermittently, reports her RW goes crooked       Hand Dominance        Extremity/Trunk Assessment   Upper Extremity Assessment Upper Extremity Assessment: Defer to OT evaluation    Lower Extremity Assessment Lower Extremity Assessment: Generalized weakness       Communication   Communication: HOH (wears hearing aides, but does not have them)  Cognition Arousal/Alertness: Awake/alert Behavior During Therapy: Anxious Overall Cognitive Status: Within Functional Limits for tasks assessed                                 General Comments: but not formally assessed, a little distracted by headache pain and dizziness/nausea        General Comments General comments (skin integrity, edema, etc.): Noted no nystagmus in supine and reports symptoms have improved, states was spinning and had N&V, but initiated as sitting upright and immediatedly c/o dizziness and nausea with RN made aware.  Unable to appreciate nystagmus in sitting though as pt needing assist to get to bathroom and incontinent along the way.  Focus on compensatory technique with visual targeting and on  OOB mobility    Exercises     Assessment/Plan    PT Assessment Patient needs continued PT services  PT Problem List Decreased strength;Decreased balance;Decreased knowledge of precautions;Decreased mobility;Decreased activity tolerance;Impaired sensation;Cardiopulmonary status limiting activity       PT Treatment Interventions DME instruction;Functional mobility training;Balance training;Patient/family education;Neuromuscular re-education;Therapeutic activities;Gait training;Therapeutic exercise;Other (comment) (vestibular rehab)    PT Goals (Current goals can be found in the Care Plan section)  Acute Rehab PT Goals Patient Stated Goal: to return to independent PT Goal Formulation: With patient Time For Goal Achievement: 12/29/21 Potential to Achieve Goals: Good    Frequency Min 3X/week     Co-evaluation               AM-PAC PT "6 Clicks" Mobility  Outcome Measure Help needed turning from your back to your side while in a flat bed without using bedrails?: A Lot Help needed moving from lying on your back to sitting on the side of a flat bed without using bedrails?: A Lot Help needed moving to and from a bed to a chair (including a wheelchair)?: A Lot Help needed standing up from a chair using your arms (e.g., wheelchair or bedside chair)?: A Lot Help needed to walk in hospital room?: A Lot Help needed climbing 3-5 steps with a railing? : Total 6 Click Score: 11    End of Session Equipment Utilized During Treatment: Gait belt Activity Tolerance: Patient limited by fatigue Patient left: in bed;with call bell/phone within reach;with nursing/sitter in room   PT Visit Diagnosis: Other abnormalities of gait  and mobility (R26.89);Dizziness and giddiness (R42);Muscle weakness (generalized) (M62.81);Other symptoms and signs involving the nervous system (R29.898)    Time: 6389-3734 PT Time Calculation (min) (ACUTE ONLY): 37 min   Charges:   PT Evaluation $PT Eval High  Complexity: 1 High PT Treatments $Gait Training: 8-22 mins        Sheran Lawless, PT Acute Rehabilitation Services Office:[phone] 12/15/2021   [name] 12/15/2021, 2:46 PM

## 2021-12-15 NOTE — Progress Notes (Signed)
Patient's great niece at bedside Deliah Goody). States she is the family member that lives the closest. [phone]. Available at any time if needed.

## 2021-12-15 NOTE — Progress Notes (Signed)
OT Cancellation Note  Patient Details Name: [name] MRN: [id number] DOB: 1934-11-23   Cancelled Treatment:    Reason Eval/Treat Not Completed: Medical issues which prohibited therapy (PT asking to hold until afternoon due to pt with nausea after session; checking back 3+ hours later and continued nausea per RN; asking pt and pt request come back later. Will return as schedule allows.)  Tyler Deis, OTR/L [facility] Acute Rehabilitation Office: [phone]  [name] 12/15/2021, 3:22 PM

## 2021-12-15 NOTE — Progress Notes (Signed)
PROGRESS NOTE    [name]  FTD:[id number] DOB: 04-Feb-1934 DOA: 12/14/2021 PCP: Blair Heys, MD   Brief Narrative:  HPI: [name] is a 87 y.o. female with medical history significant of hypertension, paroxysmal atrial fibrillation not on anticoagulation, left carotid artery stenosis s/p endarterectomy, diastolic CHF, COPD, and small bowel obstruction who presented with complaints of dizziness and nausea that started this morning at 3 am. She states that she was woken out of her sleep with the symptoms and was unable to get herself out of bed.  Unable to describe dizziness in further detail at this time.  She reported having several episodes of vomiting.  Emesis was noted to be of stomach contents and denies any blood present.  Denies any significant abdominal pain.  She had gone to bed yesterday evening in her normal state of health prior to midnight. She reports remote history of being on Coumadin, but stopped taking the medicine due to her sister previously almost bleeding out while on the medication.  To her knowledge she had not eaten anything out of the norm.  Patient does make note that she fell over someone sometime last week and hit her head, but denied sustaining any significant injury.   In route with EMS patient was noted to have an irregular heart rhythm 120s, blood pressure 180/100, and all other vital signs maintained.      In the emergency department patient was noted to be in atrial fibrillation with blood pressures elevated up to 172/91, and O2 saturations maintained on room air.  Labs significant for WBC 10.8 (appears chronic), potassium 2.8, and glucose 177.  CTA of the head and neck noted no large vessel occlusion, but complex plaque in the left ICA origin, and concern for right cervical ICA fibromuscular dysplasia.  Patient has been given Ativan, Zofran,  potassium chloride 30 mEq IV, and 1 L of normal saline IV fluids.  Assessment & Plan:   Principal Problem:    Vertigo Active Problems:   CVA (cerebral vascular accident) (HCC)   Nausea and vomiting   Paroxysmal atrial fibrillation (HCC)   Hypokalemia   Leukocytosis   Essential hypertension   Anxiety   Carotid artery disease (HCC)   Hyperlipidemia   CVA /acute cerebellar stroke:  CT angiogram of the head and neck did not note any acute abnormality.  Neurology has been consulted and MRI of the brain did not reveal any acute signs of a stroke, but neurology felt patient actually had a tiny right cerebellar stroke.  Patient still complains of nausea.  Echo shows normal ejection fraction, grade 1 diastolic dysfunction.  No other abnormality. CTA head & neck negative for LVO, but complex plaque at the left ICA origin with possible adherent thrombus within the vessel lumen, 60% left ICA stenosis, right cervical ICA fibromuscular dysplasia.  Patient has been started on Eliquis, aspirin has been discontinued.  LDL<70, patient on 20 mg of Crestor.  PT OT eval pending.   Nausea and vomiting/possible BPPV Patient does have horizontal nystagmus and feels dizzy with head movement.  Could have BPPV, I have consulted PT for vestibular therapies.  Paroxysmal atrial fibrillation Patient appears to be in atrial fibrillation and has been rate controlled with atenolol.  She had previously been on Coumadin, but stopped taking the medication after her sister had a significant bleeding event while on the medication.  Patient was just on aspirin.  CHA2DS2-VASc score =6-7.  Family is concerned with her being on blood thinners that  she is alone and has frequent falls.  However neurology has started the patient on Eliquis.   Hypokalemia Resolved.   Leukocytosis Mild, no signs of infection, monitor.   Essential hypertension Blood pressure elevated however we are allowing permissive hypertension for few more hours, hold antihypertensives.   Anxiety -Continue Ativan IV and transition back to p.o. once able    Hyperlipidemia Continue Crestor.   Frequent falls PT OT to assess.  DVT prophylaxis: Eliquis   Code Status: Full Code  Family Communication:  None present at bedside.  Plan of care discussed with patient in length and he/she verbalized understanding and agreed with it.  Status is: Observation The patient will require care spanning > 2 midnights and should be moved to inpatient because: Evaluation by PT OT pending, still symptomatic with nausea and dizziness.   Estimated body mass index is 25.71 kg/m as calculated from the following:   Height as of this encounter: 5' 4.5" (1.638 m).   Weight as of this encounter: 69 kg.    Nutritional Assessment: Body mass index is 25.71 kg/m.Marland Kitchen. Seen by dietician.  I agree with the assessment and plan as outlined below: Nutrition Status:        . Skin Assessment: I have examined the patient's skin and I agree with the wound assessment as performed by the wound care RN as outlined below:    Consultants:  Neurology  Procedures:  None  Antimicrobials:  Anti-infectives (From admission, onward)    None         Subjective: Seen and examined.  Still complains of nausea and dizziness.  Objective: Vitals:   12/14/21 1959 12/14/21 2332 12/15/21 0738 12/15/21 1132  BP: (!) 145/54 (!) 116/53 (!) 169/66 (!) 159/96  Pulse: 62 66 63 63  Resp: 16 16 18 17   Temp: 98.8 F (37.1 C) 97.9 F (36.6 C) 98.4 F (36.9 C) 98.3 F (36.8 C)  TempSrc: Oral Oral Oral Oral  SpO2: 98% 93% 93% 97%  Weight:      Height:        Intake/Output Summary (Last 24 hours) at 12/15/2021 1306 Last data filed at 12/14/2021 2334 Gross per 24 hour  Intake 220.36 ml  Output 1200 ml  Net -979.64 ml   Filed Weights   12/14/21 [id number]  Weight: 69 kg    Examination:  General exam: Appears calm and comfortable, positive horizontal nystagmus. Respiratory system: Clear to auscultation. Respiratory effort normal. Cardiovascular system: S1 & S2 heard, RRR.  No JVD, murmurs, rubs, gallops or clicks. No pedal edema. Gastrointestinal system: Abdomen is nondistended, soft and nontender. No organomegaly or masses felt. Normal bowel sounds heard. Central nervous system: Alert and oriented. No focal neurological deficits. Extremities: Symmetric 5 x 5 power. Skin: No rashes, lesions or ulcers Psychiatry: Judgement and insight appear normal. Mood & affect appropriate.    Data Reviewed: I have personally reviewed following labs and imaging studies  CBC: Recent Labs  Lab 12/14/21 0629 12/14/21 0745  WBC 10.8*  --   NEUTROABS 8.4*  --   HGB 14.7 14.3  HCT 43.7 42.0  MCV 84.5  --   PLT 301  --    Basic Metabolic Panel: Recent Labs  Lab 12/14/21 0629 12/14/21 0745 12/14/21 1347 12/15/21 0743  NA 141 142  --  137  K 2.8* 2.6*  --  3.7  CL 102 103  --  99  CO2 23  --   --  23  GLUCOSE 177* 177*  --  107*  BUN 12 12  --  16  CREATININE 0.95 0.90  --  1.37*  CALCIUM 9.5  --   --  9.0  MG  --   --  1.7  --    GFR: Estimated Creatinine Clearance: 29 mL/min (A) (by C-G formula based on SCr of 1.37 mg/dL (H)). Liver Function Tests: Recent Labs  Lab 12/14/21 0629  AST 28  ALT 16  ALKPHOS 90  BILITOT 0.6  PROT 8.2*  ALBUMIN 4.2   Recent Labs  Lab 12/14/21 0629  LIPASE 28   No results for input(s): "AMMONIA" in the last 168 hours. Coagulation Profile: Recent Labs  Lab 12/14/21 0629  INR 1.1   Cardiac Enzymes: No results for input(s): "CKTOTAL", "CKMB", "CKMBINDEX", "TROPONINI" in the last 168 hours. BNP (last 3 results) No results for input(s): "PROBNP" in the last 8760 hours. HbA1C: No results for input(s): "HGBA1C" in the last 72 hours. CBG: No results for input(s): "GLUCAP" in the last 168 hours. Lipid Profile: Recent Labs    12/15/21 0244  CHOL 90  HDL 42  LDLCALC 27  TRIG 103  CHOLHDL 2.1   Thyroid Function Tests: No results for input(s): "TSH", "T4TOTAL", "FREET4", "T3FREE", "THYROIDAB" in the last 72  hours. Anemia Panel: No results for input(s): "VITAMINB12", "FOLATE", "FERRITIN", "TIBC", "IRON", "RETICCTPCT" in the last 72 hours. Sepsis Labs: No results for input(s): "PROCALCITON", "LATICACIDVEN" in the last 168 hours.  No results found for this or any previous visit (from the past 240 hour(s)).   Radiology Studies: ECHOCARDIOGRAM COMPLETE  Result Date: 12/15/2021    ECHOCARDIOGRAM REPORT   Patient Name:   [name] Date of Exam: 12/15/2021 Medical Rec #:  [id number]   Height:       64.5 in Accession #:    [id number]  Weight:       152.1 lb Date of Birth:  01-14-1935   BSA:          1.751 m Patient Age:    87 years    BP:           180/100 mmHg Patient Gender: F           HR:           62 bpm. Exam Location:  Inpatient Procedure: 2D Echo, Color Doppler and Cardiac Doppler Indications:    Atrial fibrillation  History:        Patient has prior history of Echocardiogram examinations, most                 recent 01/28/2017. CHF, COPD, Arrythmias:Atrial Fibrillation; Risk                 Factors:Hypertension.  Sonographer:    Milda Smart Referring Phys: [id number] RONDELL A SMITH  Sonographer Comments: Technically difficult study due to poor echo windows. Image acquisition challenging due to patient body habitus and Image acquisition challenging due to respiratory motion. IMPRESSIONS  1. Left ventricular ejection fraction, by estimation, is 55 to 60%. The left ventricle has normal function. The left ventricle has no regional wall motion abnormalities. Left ventricular diastolic parameters are consistent with Grade I diastolic dysfunction (impaired relaxation).  2. Right ventricular systolic function is normal. The right ventricular size is normal. There is normal pulmonary artery systolic pressure.  3. The mitral valve is normal in structure. Mild mitral valve regurgitation. No evidence of mitral stenosis.  4. The aortic valve is tricuspid. There is mild calcification of the aortic valve. Aortic  valve  regurgitation is trivial. No aortic stenosis is present.  5. The inferior vena cava is normal in size with greater than 50% respiratory variability, suggesting right atrial pressure of 3 mmHg. Comparison(s): MR improved from prior reporting. FINDINGS  Left Ventricle: Left ventricular ejection fraction, by estimation, is 55 to 60%. The left ventricle has normal function. The left ventricle has no regional wall motion abnormalities. The left ventricular internal cavity size was normal in size. There is  no left ventricular hypertrophy. Left ventricular diastolic parameters are consistent with Grade I diastolic dysfunction (impaired relaxation). Right Ventricle: The right ventricular size is normal. No increase in right ventricular wall thickness. Right ventricular systolic function is normal. There is normal pulmonary artery systolic pressure. The tricuspid regurgitant velocity is 2.41 m/s, and  with an assumed right atrial pressure of 3 mmHg, the estimated right ventricular systolic pressure is 26.2 mmHg. Left Atrium: Left atrial size was normal in size. Right Atrium: Right atrial size was normal in size. Pericardium: Trivial pericardial effusion is present. Presence of epicardial fat layer. Mitral Valve: The mitral valve is normal in structure. Mild mitral valve regurgitation. No evidence of mitral valve stenosis. Tricuspid Valve: The tricuspid valve is normal in structure. Tricuspid valve regurgitation is mild . No evidence of tricuspid stenosis. Aortic Valve: The aortic valve is tricuspid. There is mild calcification of the aortic valve. There is mild aortic valve annular calcification. Aortic valve regurgitation is trivial. No aortic stenosis is present. Pulmonic Valve: The pulmonic valve was not well visualized. Pulmonic valve regurgitation is not visualized. No evidence of pulmonic stenosis. Aorta: The aortic root and ascending aorta are structurally normal, with no evidence of dilitation. Venous: The inferior  vena cava is normal in size with greater than 50% respiratory variability, suggesting right atrial pressure of 3 mmHg. IAS/Shunts: No atrial level shunt detected by color flow Doppler.  LEFT VENTRICLE PLAX 2D LVIDd:         3.90 cm     Diastology LVIDs:         2.90 cm     LV e' medial:    3.75 cm/s LV PW:         0.90 cm     LV E/e' medial:  18.0 LV IVS:        0.90 cm     LV e' lateral:   6.23 cm/s LVOT diam:     1.70 cm     LV E/e' lateral: 10.8 LV SV:         45 LV SV Index:   26 LVOT Area:     2.27 cm  LV Volumes (MOD) LV vol d, MOD A2C: 91.6 ml LV vol d, MOD A4C: 92.2 ml LV vol s, MOD A2C: 39.8 ml LV vol s, MOD A4C: 46.4 ml LV SV MOD A2C:     51.8 ml LV SV MOD A4C:     92.2 ml LV SV MOD BP:      47.7 ml RIGHT VENTRICLE             IVC RV S prime:     10.10 cm/s  IVC diam: 1.30 cm TAPSE (M-mode): 1.6 cm LEFT ATRIUM             Index        RIGHT ATRIUM           Index LA diam:        3.20 cm 1.83 cm/m   RA Area:  14.60 cm LA Vol (A2C):   49.8 ml 28.44 ml/m  RA Volume:   36.00 ml  20.56 ml/m LA Vol (A4C):   44.7 ml 25.53 ml/m LA Biplane Vol: 50.1 ml 28.61 ml/m  AORTIC VALVE LVOT Vmax:   85.30 cm/s LVOT Vmean:  61.800 cm/s LVOT VTI:    0.200 m  AORTA Ao Root diam: 2.70 cm Ao Asc diam:  2.80 cm MITRAL VALVE               TRICUSPID VALVE MV Area (PHT): 2.38 cm    TR Peak grad:   23.2 mmHg MV Decel Time: 319 msec    TR Mean grad:   17.0 mmHg MR Peak grad: 100.4 mmHg   TR Vmax:        241.00 cm/s MR Vmax:      501.00 cm/s  TR Vmean:       196.0 cm/s MV E velocity: 67.50 cm/s MV A velocity: 82.50 cm/s  SHUNTS MV E/A ratio:  0.82        Systemic VTI:  0.20 m                            Systemic Diam: 1.70 cm Riley Lam MD Electronically signed by Riley Lam MD Signature Date/Time: 12/15/2021/12:33:15 PM    Final    DG ABD ACUTE 2+V W 1V CHEST  Result Date: 12/14/2021 CLINICAL DATA:  Nausea vomiting. EXAM: DG ABDOMEN ACUTE WITH 1 VIEW CHEST COMPARISON:  Chest x-ray of July 05, 2017.  FINDINGS: EKG leads project over the chest. Cardiomediastinal contours and hilar structures are normal. Lungs are clear. No pneumothorax. No pleural effusion. No free air beneath either the RIGHT or LEFT hemidiaphragm. Moderate to marked RIGHT and moderate LEFT hydronephrosis as exhibited by excreted recently administered contrast material in the urinary tract. Urinary bladder is markedly distended and partially filled with dilute contrast media as well. No signs of bowel obstruction.  Post cholecystectomy. Near complete loss of height of T12. Age indeterminate but new since previous imaging. IMPRESSION: 1. No acute cardiopulmonary disease. 2. Findings that suggest bladder outlet obstruction or urinary retention as evidence by the marked distension of the urinary bladder and bilateral hydronephrosis RIGHT greater than LEFT. Correlate clinically and consider Foley catheter placement and are dedicated abdominal imaging. 3. T12 compression fracture suggestive of vertebra plana. Acuity uncertain. Correlate with any signs of pain in this area. This could be a cause for urinary retention. Electronically Signed   By: Donzetta Kohut M.D.   On: 12/14/2021 12:39   MR Brain Wo Contrast (neuro protocol)  Result Date: 12/14/2021 CLINICAL DATA:  Acute onset of severe dizziness and nausea. EXAM: MRI HEAD WITHOUT CONTRAST TECHNIQUE: Multiplanar, multiecho pulse sequences of the brain and surrounding structures were obtained without intravenous contrast. COMPARISON:  CTA head and neck 12/14/2021 FINDINGS: Brain: No acute infarct, hemorrhage, or mass lesion is present. Mild atrophy and white matter changes are within normal limits for age. Deep brain nuclei are within normal limits. The ventricles are of normal size. No significant extraaxial fluid collection is present. The internal auditory canals are within normal limits. The brainstem and cerebellum are within normal limits. Vascular: Flow is present in the major  intracranial arteries. Skull and upper cervical spine: The craniocervical junction is normal. Upper cervical spine is within normal limits. Marrow signal is unremarkable. Right supraorbital soft tissue swelling is new since the prior exam. Sinuses/Orbits: Minimal fluid is present  in the mastoid air cells bilaterally. No obstructing nasopharyngeal lesion is present. The paranasal sinuses and mastoid air cells are otherwise clear. Bilateral lens replacements are noted. Globes and orbits are otherwise unremarkable. IMPRESSION: 1. Normal MRI appearance of the brain for age. No acute or focal lesion to explain the patient's symptoms. 2. Right supraorbital soft tissue swelling is new since the prior exam. Electronically Signed   By: Marin Roberts M.D.   On: 12/14/2021 09:31   CT ANGIO HEAD NECK W WO CM  Result Date: 12/14/2021 CLINICAL DATA:  87 year old female with dizziness and nausea since 0300 hours. EXAM: CT ANGIOGRAPHY HEAD AND NECK TECHNIQUE: Multidetector CT imaging of the head and neck was performed using the standard protocol during bolus administration of intravenous contrast. Multiplanar CT image reconstructions and MIPs were obtained to evaluate the vascular anatomy. Carotid stenosis measurements (when applicable) are obtained utilizing NASCET criteria, using the distal internal carotid diameter as the denominator. RADIATION DOSE REDUCTION: This exam was performed according to the departmental dose-optimization program which includes automated exposure control, adjustment of the mA and/or kV according to patient size and/or use of iterative reconstruction technique. CONTRAST:  69mL OMNIPAQUE IOHEXOL 350 MG/ML SOLN COMPARISON:  Head CT 03/25/2018. FINDINGS: CT HEAD Brain: Cerebral volume remains within normal limits for age. No midline shift, mass effect, or evidence of intracranial mass lesion. No ventriculomegaly. No acute intracranial hemorrhage identified. Gray-white matter differentiation  appears stable since 2020 and within normal limits for age throughout the brain. Calvarium and skull base: No acute osseous abnormality identified. Paranasal sinuses: Tympanic cavities, Visualized paranasal sinuses and mastoids are clear. Orbits: Mild rightward gaze. No other acute orbit or scalp soft tissue finding. CTA NECK Skeleton: Chronic severe craniocervical and atlantoaxial degeneration with bulky osteophytosis and subchondral erosions. Superimposed cervical facet ankylosis at C3-C4. Advanced lower cervical disc and endplate degeneration. No acute osseous abnormality identified. Upper chest: Emphysema. Visible upper thoracic esophagus is mildly distended with fluid and gas. Otherwise negative visible superior mediastinum. Other neck: Cervical esophagus mildly gas distended. Other neck soft tissue spaces appear within normal limits. Aortic arch: Porch wrists aortic arch. The entire arch is not included. Probable three-vessel configuration. Distal arch tortuosity, estimated at 33 mm diameter which is dilated but nonaneurysmal. Right carotid system: Visible brachiocephalic artery with mild plaque and no stenosis. Mildly tortuous right CCA origin with no plaque. Calcified plaque at the right carotid bifurcation primarily affects the ECA. Tortuous right ICA with a mildly beaded appearance (series 15, image 19), but no stenosis to the skull base. Left carotid system: Left CCA origin not included. Left CCA patent without stenosis proximal to the bifurcation. At the left carotid bifurcation there is complex plaque with combined 60% origin stenosis and evidence of ulcerated plaque or adherent thrombus in the proximal ICA bulb. See series 12, image 87. A carotid web might also explain this appearance, but the lesion has an unusual trefoil shape. Additional plaque at the left ICA bulb, but no other stenosis to the skull base. Vertebral arteries: Tortuous proximal right subclavian artery of mild plaque and no stenosis.  Normal right vertebral artery origin. Tortuous right V1 segment. Tortuous right vertebral artery with no plaque or stenosis to the skull base. Proximal left subclavian artery soft and calcified plaque with mild stenosis. Normal left vertebral artery origin. Tortuous left vertebral artery is codominant and patent to the skull base with no plaque or stenosis. CTA HEAD Posterior circulation: Codominant distal vertebral arteries and patent vertebrobasilar junction without stenosis. Patent  right PICA and dominant appearing left AICA origins. Patent basilar artery without stenosis. Patent SCA and PCA origins. Posterior communicating arteries are diminutive or absent. Bilateral PCA branches are within normal limits. Anterior circulation: Both ICA siphons are patent with tortuosity and mild to moderate calcified plaque, but only mild distal right cavernous segment stenosis. Patent carotid termini. Normal MCA and ACA origins. Normal anterior communicating artery and bilateral ACA branches. Left MCA M1 segment and trifurcation are patent without stenosis. Right MCA M1 segment and bifurcation are patent without stenosis. Bilateral MCA branches are within normal limits. Venous sinuses: Early contrast timing, not well evaluated. Anatomic variants: None. Review of the MIP images confirms the above findings IMPRESSION: 1. Negative for large vessel occlusion, but Positive for complex plaque at the Left ICA origin where it's difficult to exclude ADHERENT THROMBUS within the vessel lumen (series 12, image 87, but alternatively this appearance might be ulcerated plaque and/or an unusual carotid web. Subsequent stenosis numerically estimated at 60%. 2. Positive also for evidence of cervical Right ICA Fibromuscular Dysplasia (FMD). 3. But otherwise mild for age atherosclerosis, and essentially negative intracranial anterior circulation with no other arterial stenosis identified. 4. Stable CT appearance of the brain, No acute  intracranial abnormality. 5. Aortic Atherosclerosis (ICD10-I70.0) and Emphysema (ICD10-J43.9). Tortuous aortic arch. 6. 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 Electronically Signed   By: Odessa Fleming M.D.   On: 12/14/2021 08:42    Scheduled Meds:  [START ON 12/16/2021] apixaban  5 mg Oral BID   oxybutynin  5 mg Oral Daily   pantoprazole  40 mg Oral Daily   rosuvastatin  20 mg Oral Daily   sertraline  100 mg Oral QHS   Continuous Infusions:  promethazine (PHENERGAN) injection (IM or IVPB)       LOS: 0 days   Hughie Closs, MD [facility]  12/15/2021, 1:06 PM   *Please note that this is a verbal dictation therefore any spelling or grammatical errors are due to the "[employer] One" system interpretation.  Please page via Amion and do not message via secure chat for urgent patient care matters. Secure chat can be used for non urgent patient care matters.  How to contact the [facility] Attending or Consulting provider 7A - 7P or covering provider during after hours 7P -7A, for this patient?  Check the care team in [facility] and look for a) attending/consulting [facility] provider listed and b) the [facility] team listed. Page or secure chat 7A-7P. Log into [email or web address] and use [facility] universal password to access. If you do not have the password, please contact the hospital operator. Locate the [facility] provider you are looking for under [facility] and page to a number that you can be directly reached. If you still have difficulty reaching the provider, please page the [facility] (Director on Call) for the Hospitalists listed on amion for assistance.

## 2021-12-16 DIAGNOSIS — Z8249 Family history of ischemic heart disease and other diseases of the circulatory system: Secondary | ICD-10-CM | POA: Diagnosis not present

## 2021-12-16 DIAGNOSIS — E876 Hypokalemia: Secondary | ICD-10-CM | POA: Diagnosis present

## 2021-12-16 DIAGNOSIS — R29700 NIHSS score 0: Secondary | ICD-10-CM | POA: Diagnosis present

## 2021-12-16 DIAGNOSIS — Z96651 Presence of right artificial knee joint: Secondary | ICD-10-CM | POA: Diagnosis present

## 2021-12-16 DIAGNOSIS — Z79899 Other long term (current) drug therapy: Secondary | ICD-10-CM | POA: Diagnosis not present

## 2021-12-16 DIAGNOSIS — Z90710 Acquired absence of both cervix and uterus: Secondary | ICD-10-CM | POA: Diagnosis not present

## 2021-12-16 DIAGNOSIS — I480 Paroxysmal atrial fibrillation: Secondary | ICD-10-CM | POA: Diagnosis present

## 2021-12-16 DIAGNOSIS — Z87891 Personal history of nicotine dependence: Secondary | ICD-10-CM | POA: Diagnosis not present

## 2021-12-16 DIAGNOSIS — I69398 Other sequelae of cerebral infarction: Secondary | ICD-10-CM | POA: Diagnosis not present

## 2021-12-16 DIAGNOSIS — E785 Hyperlipidemia, unspecified: Secondary | ICD-10-CM | POA: Diagnosis present

## 2021-12-16 DIAGNOSIS — F419 Anxiety disorder, unspecified: Secondary | ICD-10-CM | POA: Diagnosis present

## 2021-12-16 DIAGNOSIS — I639 Cerebral infarction, unspecified: Secondary | ICD-10-CM | POA: Diagnosis not present

## 2021-12-16 DIAGNOSIS — I63541 Cerebral infarction due to unspecified occlusion or stenosis of right cerebellar artery: Secondary | ICD-10-CM | POA: Diagnosis present

## 2021-12-16 DIAGNOSIS — Z880 Allergy status to penicillin: Secondary | ICD-10-CM | POA: Diagnosis not present

## 2021-12-16 DIAGNOSIS — I10 Essential (primary) hypertension: Secondary | ICD-10-CM | POA: Diagnosis present

## 2021-12-16 DIAGNOSIS — H5509 Other forms of nystagmus: Secondary | ICD-10-CM | POA: Diagnosis present

## 2021-12-16 DIAGNOSIS — R42 Dizziness and giddiness: Secondary | ICD-10-CM | POA: Diagnosis not present

## 2021-12-16 DIAGNOSIS — R296 Repeated falls: Secondary | ICD-10-CM | POA: Diagnosis present

## 2021-12-16 DIAGNOSIS — J449 Chronic obstructive pulmonary disease, unspecified: Secondary | ICD-10-CM | POA: Diagnosis present

## 2021-12-16 DIAGNOSIS — N179 Acute kidney failure, unspecified: Secondary | ICD-10-CM | POA: Diagnosis not present

## 2021-12-16 DIAGNOSIS — I482 Chronic atrial fibrillation, unspecified: Secondary | ICD-10-CM | POA: Diagnosis not present

## 2021-12-16 DIAGNOSIS — K5900 Constipation, unspecified: Secondary | ICD-10-CM | POA: Diagnosis not present

## 2021-12-16 DIAGNOSIS — E871 Hypo-osmolality and hyponatremia: Secondary | ICD-10-CM | POA: Diagnosis not present

## 2021-12-16 DIAGNOSIS — Z881 Allergy status to other antibiotic agents status: Secondary | ICD-10-CM | POA: Diagnosis not present

## 2021-12-16 DIAGNOSIS — M792 Neuralgia and neuritis, unspecified: Secondary | ICD-10-CM | POA: Diagnosis not present

## 2021-12-16 DIAGNOSIS — Z7982 Long term (current) use of aspirin: Secondary | ICD-10-CM | POA: Diagnosis not present

## 2021-12-16 LAB — BASIC METABOLIC PANEL
Anion gap: 11 (ref 5–15)
BUN: 18 mg/dL (ref 8–23)
CO2: 28 mmol/L (ref 22–32)
Calcium: 9.3 mg/dL (ref 8.9–10.3)
Chloride: 99 mmol/L (ref 98–111)
Creatinine, Ser: 1.23 mg/dL — ABNORMAL HIGH (ref 0.44–1.00)
GFR, Estimated: 43 mL/min — ABNORMAL LOW (ref >60)
Glucose, Bld: 105 mg/dL — ABNORMAL HIGH (ref 70–99)
Potassium: 3.9 mmol/L (ref 3.5–5.1)
Sodium: 138 mmol/L (ref 135–145)

## 2021-12-16 LAB — CBC WITH DIFFERENTIAL/PLATELET
Abs Immature Granulocytes: 0.05 10*3/uL (ref 0.00–0.07)
Basophils Absolute: 0 10*3/uL (ref 0.0–0.1)
Basophils Relative: 0 %
Eosinophils Absolute: 0.1 10*3/uL (ref 0.0–0.5)
Eosinophils Relative: 1 %
HCT: 41.2 % (ref 36.0–46.0)
Hemoglobin: 13.4 g/dL (ref 12.0–15.0)
Immature Granulocytes: 0 %
Lymphocytes Relative: 12 %
Lymphs Abs: 1.7 10*3/uL (ref 0.7–4.0)
MCH: 27.8 pg (ref 26.0–34.0)
MCHC: 32.5 g/dL (ref 30.0–36.0)
MCV: 85.5 fL (ref 80.0–100.0)
Monocytes Absolute: 0.7 10*3/uL (ref 0.1–1.0)
Monocytes Relative: 5 %
Neutro Abs: 11.3 10*3/uL — ABNORMAL HIGH (ref 1.7–7.7)
Neutrophils Relative %: 82 %
Platelets: 251 10*3/uL (ref 150–400)
RBC: 4.82 MIL/uL (ref 3.87–5.11)
RDW: 14.3 % (ref 11.5–15.5)
WBC: 13.9 10*3/uL — ABNORMAL HIGH (ref 4.0–10.5)
nRBC: 0 % (ref 0.0–0.2)

## 2021-12-16 LAB — MAGNESIUM: Magnesium: 1.9 mg/dL (ref 1.7–2.4)

## 2021-12-16 LAB — HEMOGLOBIN A1C
Hgb A1c MFr Bld: 5.7 % — ABNORMAL HIGH (ref 4.8–5.6)
Mean Plasma Glucose: 117 mg/dL

## 2021-12-16 MED ORDER — OXYCODONE-ACETAMINOPHEN 5-325 MG PO TABS
1.0000 | ORAL_TABLET | ORAL | Status: AC | PRN
  Administered 2021-12-16 – 2021-12-17 (×2): 1 via ORAL
  Filled 2021-12-16 (×2): qty 1

## 2021-12-16 MED ORDER — SODIUM CHLORIDE 0.9 % IV SOLN: INTRAVENOUS | Status: AC

## 2021-12-16 MED ORDER — POLYETHYLENE GLYCOL 3350 17 G PO PACK: 17.0000 g | PACK | Freq: Every day | ORAL | Status: DC | PRN

## 2021-12-16 NOTE — Progress Notes (Signed)
PROGRESS NOTE    [name]  ZOX:[id number] DOB: 04-Nov-1934 DOA: 12/14/2021 PCP: Blair HeysEhinger, Robert, MD   Brief Narrative:  HPI: [name] is a 87 y.o. female with medical history significant of hypertension, paroxysmal atrial fibrillation not on anticoagulation, left carotid artery stenosis s/p endarterectomy, diastolic CHF, COPD, and small bowel obstruction who presented with complaints of dizziness and nausea that started this morning at 3 am. She states that she was woken out of her sleep with the symptoms and was unable to get herself out of bed.  Unable to describe dizziness in further detail at this time.  She reported having several episodes of vomiting.  Emesis was noted to be of stomach contents and denies any blood present.  Denies any significant abdominal pain.  She had gone to bed yesterday evening in her normal state of health prior to midnight. She reports remote history of being on Coumadin, but stopped taking the medicine due to her sister previously almost bleeding out while on the medication.  To her knowledge she had not eaten anything out of the norm.  Patient does make note that she fell over someone sometime last week and hit her head, but denied sustaining any significant injury.   In route with EMS patient was noted to have an irregular heart rhythm 120s, blood pressure 180/100, and all other vital signs maintained.      In the emergency department patient was noted to be in atrial fibrillation with blood pressures elevated up to 172/91, and O2 saturations maintained on room air.  Labs significant for WBC 10.8 (appears chronic), potassium 2.8, and glucose 177.  CTA of the head and neck noted no large vessel occlusion, but complex plaque in the left ICA origin, and concern for right cervical ICA fibromuscular dysplasia.  Patient has been given Ativan, Zofran,  potassium chloride 30 mEq IV, and 1 L of normal saline IV fluids.  Assessment & Plan:   Principal Problem:    Vertigo Active Problems:   CVA (cerebral vascular accident) (HCC)   Nausea and vomiting   Paroxysmal atrial fibrillation (HCC)   Hypokalemia   Leukocytosis   Essential hypertension   Anxiety   Carotid artery disease (HCC)   Hyperlipidemia   Acute ischemic stroke (HCC)   CVA /acute cerebellar stroke:  CT angiogram of the head and neck did not note any acute abnormality.  Neurology consulted and MRI of the brain did not reveal any acute signs of a stroke, but neurology felt patient actually had a tiny right cerebellar stroke.  Patient still complains of intermittent nausea.  Echo shows normal ejection fraction, grade 1 diastolic dysfunction.  No other abnormality. CTA head & neck negative for LVO, but complex plaque at the left ICA origin with possible adherent thrombus within the vessel lumen, 60% left ICA stenosis, right cervical ICA fibromuscular dysplasia.  Patient has been started on Eliquis, aspirin has been discontinued.  LDL<70, patient on 20 mg of Crestor.  PT OT recommends CIR.  TOC on board for arranging that.   Nausea and vomiting/possible BPPV Patient does have horizontal nystagmus and feels dizzy with head movement but she has improved significantly compared to yesterday.  Vestibular therapy ordered.  Paroxysmal atrial fibrillation Patient appears to be in atrial fibrillation and has been rate controlled with atenolol.  She had previously been on Coumadin, but stopped taking the medication after her sister had a significant bleeding event while on the medication.  Patient was just on aspirin.  CHA2DS2-VASc  score =6-7.  Family is concerned with her being on blood thinners that she is alone and has frequent falls.  However neurology has started the patient on Eliquis.   Hypokalemia Resolved.   Leukocytosis Mild, no signs of infection, monitor.   Essential hypertension Blood pressure elevated however we are allowing permissive hypertension for few more hours, hold  antihypertensives.   Anxiety -Continue Ativan IV and transition back to p.o. once able   Hyperlipidemia Continue Crestor.   Frequent falls PT OT to assess.  DVT prophylaxis: Eliquis   Code Status: Full Code  Family Communication: Neice present at bedside.  Plan of care discussed with patient in length and he/she verbalized understanding and agreed with it.  Status is: Inpatient Remains inpatient appropriate because: Needs safe discharge/CIR     Estimated body mass index is 25.71 kg/m as calculated from the following:   Height as of this encounter: 5' 4.5" (1.638 m).   Weight as of this encounter: 69 kg.    Nutritional Assessment: Body mass index is 25.71 kg/m.Marland Kitchen Seen by dietician.  I agree with the assessment and plan as outlined below: Nutrition Status:        . Skin Assessment: I have examined the patient's skin and I agree with the wound assessment as performed by the wound care RN as outlined below:    Consultants:  Neurology  Procedures:  None  Antimicrobials:  Anti-infectives (From admission, onward)    None         Subjective:  Seen and examined.  Niece at the bedside.  Patient still has intermittent nausea but no dizziness.  She feels better than yesterday.  Objective: Vitals:   12/15/21 2039 12/15/21 2128 12/15/21 2148 12/16/21 0931  BP: (!) 163/69 (!) 189/78 (!) 149/80 (!) 158/82  Pulse: 68 75 73 85  Resp: 18 18  17   Temp: 98.1 F (36.7 C)   97.6 F (36.4 C)  TempSrc: Oral   Oral  SpO2: 94% 95% 95% 90%  Weight:      Height:        Intake/Output Summary (Last 24 hours) at 12/16/2021 1148 Last data filed at 12/15/2021 1434 Gross per 24 hour  Intake 150 ml  Output --  Net 150 ml    Filed Weights   12/14/21 12/16/21  Weight: 69 kg    Examination:  General exam: Appears calm and comfortable  Respiratory system: Clear to auscultation. Respiratory effort normal. Cardiovascular system: S1 & S2 heard, RRR. No JVD, murmurs, rubs,  gallops or clicks. No pedal edema. Gastrointestinal system: Abdomen is nondistended, soft and nontender. No organomegaly or masses felt. Normal bowel sounds heard. Central nervous system: Alert and oriented. No focal neurological deficits. Extremities: Symmetric 5 x 5 power. Skin: No rashes, lesions or ulcers.  Psychiatry: Judgement and insight appear normal. Mood & affect appropriate.   Data Reviewed: I have personally reviewed following labs and imaging studies  CBC: Recent Labs  Lab 12/14/21 0629 12/14/21 0745 12/16/21 0342  WBC 10.8*  --  13.9*  NEUTROABS 8.4*  --  11.3*  HGB 14.7 14.3 13.4  HCT 43.7 42.0 41.2  MCV 84.5  --  85.5  PLT 301  --  251    Basic Metabolic Panel: Recent Labs  Lab 12/14/21 0629 12/14/21 0745 12/14/21 1347 12/15/21 0743 12/16/21 0342  NA 141 142  --  137 138  K 2.8* 2.6*  --  3.7 3.9  CL 102 103  --  99 99  CO2 23  --   --  23 28  GLUCOSE 177* 177*  --  107* 105*  BUN 12 12  --  16 18  CREATININE 0.95 0.90  --  1.37* 1.23*  CALCIUM 9.5  --   --  9.0 9.3  MG  --   --  1.7  --  1.9    GFR: Estimated Creatinine Clearance: 32.3 mL/min (A) (by C-G formula based on SCr of 1.23 mg/dL (H)). Liver Function Tests: Recent Labs  Lab 12/14/21 0629  AST 28  ALT 16  ALKPHOS 90  BILITOT 0.6  PROT 8.2*  ALBUMIN 4.2    Recent Labs  Lab 12/14/21 0629  LIPASE 28    No results for input(s): "AMMONIA" in the last 168 hours. Coagulation Profile: Recent Labs  Lab 12/14/21 0629  INR 1.1    Cardiac Enzymes: No results for input(s): "CKTOTAL", "CKMB", "CKMBINDEX", "TROPONINI" in the last 168 hours. BNP (last 3 results) No results for input(s): "PROBNP" in the last 8760 hours. HbA1C: Recent Labs    12/15/21 0244  HGBA1C 5.7*   CBG: No results for input(s): "GLUCAP" in the last 168 hours. Lipid Profile: Recent Labs    12/15/21 0244  CHOL 90  HDL 42  LDLCALC 27  TRIG 103  CHOLHDL 2.1    Thyroid Function Tests: No results for  input(s): "TSH", "T4TOTAL", "FREET4", "T3FREE", "THYROIDAB" in the last 72 hours. Anemia Panel: No results for input(s): "VITAMINB12", "FOLATE", "FERRITIN", "TIBC", "IRON", "RETICCTPCT" in the last 72 hours. Sepsis Labs: No results for input(s): "PROCALCITON", "LATICACIDVEN" in the last 168 hours.  No results found for this or any previous visit (from the past 240 hour(s)).   Radiology Studies: ECHOCARDIOGRAM COMPLETE  Result Date: 12/15/2021    ECHOCARDIOGRAM REPORT   Patient Name:   KHADEJA ABT Date of Exam: 12/15/2021 Medical Rec #:  [id number]   Height:       64.5 in Accession #:    [id number]  Weight:       152.1 lb Date of Birth:  03-20-1934   BSA:          1.751 m Patient Age:    87 years    BP:           180/100 mmHg Patient Gender: F           HR:           62 bpm. Exam Location:  Inpatient Procedure: 2D Echo, Color Doppler and Cardiac Doppler Indications:    Atrial fibrillation  History:        Patient has prior history of Echocardiogram examinations, most                 recent 01/28/2017. CHF, COPD, Arrythmias:Atrial Fibrillation; Risk                 Factors:Hypertension.  Sonographer:    Milda Smart Referring Phys: [id number] RONDELL A SMITH  Sonographer Comments: Technically difficult study due to poor echo windows. Image acquisition challenging due to patient body habitus and Image acquisition challenging due to respiratory motion. IMPRESSIONS  1. Left ventricular ejection fraction, by estimation, is 55 to 60%. The left ventricle has normal function. The left ventricle has no regional wall motion abnormalities. Left ventricular diastolic parameters are consistent with Grade I diastolic dysfunction (impaired relaxation).  2. Right ventricular systolic function is normal. The right ventricular size is normal. There is normal pulmonary artery systolic pressure.  3. The mitral valve is normal in structure. Mild mitral valve regurgitation.  No evidence of mitral stenosis.  4. The aortic valve is  tricuspid. There is mild calcification of the aortic valve. Aortic valve regurgitation is trivial. No aortic stenosis is present.  5. The inferior vena cava is normal in size with greater than 50% respiratory variability, suggesting right atrial pressure of 3 mmHg. Comparison(s): MR improved from prior reporting. FINDINGS  Left Ventricle: Left ventricular ejection fraction, by estimation, is 55 to 60%. The left ventricle has normal function. The left ventricle has no regional wall motion abnormalities. The left ventricular internal cavity size was normal in size. There is  no left ventricular hypertrophy. Left ventricular diastolic parameters are consistent with Grade I diastolic dysfunction (impaired relaxation). Right Ventricle: The right ventricular size is normal. No increase in right ventricular wall thickness. Right ventricular systolic function is normal. There is normal pulmonary artery systolic pressure. The tricuspid regurgitant velocity is 2.41 m/s, and  with an assumed right atrial pressure of 3 mmHg, the estimated right ventricular systolic pressure is 26.2 mmHg. Left Atrium: Left atrial size was normal in size. Right Atrium: Right atrial size was normal in size. Pericardium: Trivial pericardial effusion is present. Presence of epicardial fat layer. Mitral Valve: The mitral valve is normal in structure. Mild mitral valve regurgitation. No evidence of mitral valve stenosis. Tricuspid Valve: The tricuspid valve is normal in structure. Tricuspid valve regurgitation is mild . No evidence of tricuspid stenosis. Aortic Valve: The aortic valve is tricuspid. There is mild calcification of the aortic valve. There is mild aortic valve annular calcification. Aortic valve regurgitation is trivial. No aortic stenosis is present. Pulmonic Valve: The pulmonic valve was not well visualized. Pulmonic valve regurgitation is not visualized. No evidence of pulmonic stenosis. Aorta: The aortic root and ascending aorta are  structurally normal, with no evidence of dilitation. Venous: The inferior vena cava is normal in size with greater than 50% respiratory variability, suggesting right atrial pressure of 3 mmHg. IAS/Shunts: No atrial level shunt detected by color flow Doppler.  LEFT VENTRICLE PLAX 2D LVIDd:         3.90 cm     Diastology LVIDs:         2.90 cm     LV e' medial:    3.75 cm/s LV PW:         0.90 cm     LV E/e' medial:  18.0 LV IVS:        0.90 cm     LV e' lateral:   6.23 cm/s LVOT diam:     1.70 cm     LV E/e' lateral: 10.8 LV SV:         45 LV SV Index:   26 LVOT Area:     2.27 cm  LV Volumes (MOD) LV vol d, MOD A2C: 91.6 ml LV vol d, MOD A4C: 92.2 ml LV vol s, MOD A2C: 39.8 ml LV vol s, MOD A4C: 46.4 ml LV SV MOD A2C:     51.8 ml LV SV MOD A4C:     92.2 ml LV SV MOD BP:      47.7 ml RIGHT VENTRICLE             IVC RV S prime:     10.10 cm/s  IVC diam: 1.30 cm TAPSE (M-mode): 1.6 cm LEFT ATRIUM             Index        RIGHT ATRIUM  Index LA diam:        3.20 cm 1.83 cm/m   RA Area:     14.60 cm LA Vol (A2C):   49.8 ml 28.44 ml/m  RA Volume:   36.00 ml  20.56 ml/m LA Vol (A4C):   44.7 ml 25.53 ml/m LA Biplane Vol: 50.1 ml 28.61 ml/m  AORTIC VALVE LVOT Vmax:   85.30 cm/s LVOT Vmean:  61.800 cm/s LVOT VTI:    0.200 m  AORTA Ao Root diam: 2.70 cm Ao Asc diam:  2.80 cm MITRAL VALVE               TRICUSPID VALVE MV Area (PHT): 2.38 cm    TR Peak grad:   23.2 mmHg MV Decel Time: 319 msec    TR Mean grad:   17.0 mmHg MR Peak grad: 100.4 mmHg   TR Vmax:        241.00 cm/s MR Vmax:      501.00 cm/s  TR Vmean:       196.0 cm/s MV E velocity: 67.50 cm/s MV A velocity: 82.50 cm/s  SHUNTS MV E/A ratio:  0.82        Systemic VTI:  0.20 m                            Systemic Diam: 1.70 cm Riley Lam MD Electronically signed by Riley Lam MD Signature Date/Time: 12/15/2021/12:33:15 PM    Final    DG ABD ACUTE 2+V W 1V CHEST  Result Date: 12/14/2021 CLINICAL DATA:  Nausea vomiting. EXAM: DG  ABDOMEN ACUTE WITH 1 VIEW CHEST COMPARISON:  Chest x-ray of July 05, 2017. FINDINGS: EKG leads project over the chest. Cardiomediastinal contours and hilar structures are normal. Lungs are clear. No pneumothorax. No pleural effusion. No free air beneath either the RIGHT or LEFT hemidiaphragm. Moderate to marked RIGHT and moderate LEFT hydronephrosis as exhibited by excreted recently administered contrast material in the urinary tract. Urinary bladder is markedly distended and partially filled with dilute contrast media as well. No signs of bowel obstruction.  Post cholecystectomy. Near complete loss of height of T12. Age indeterminate but new since previous imaging. IMPRESSION: 1. No acute cardiopulmonary disease. 2. Findings that suggest bladder outlet obstruction or urinary retention as evidence by the marked distension of the urinary bladder and bilateral hydronephrosis RIGHT greater than LEFT. Correlate clinically and consider Foley catheter placement and are dedicated abdominal imaging. 3. T12 compression fracture suggestive of vertebra plana. Acuity uncertain. Correlate with any signs of pain in this area. This could be a cause for urinary retention. Electronically Signed   By: Donzetta Kohut M.D.   On: 12/14/2021 12:39    Scheduled Meds:  apixaban  5 mg Oral BID   oxybutynin  5 mg Oral Daily   pantoprazole  40 mg Oral Daily   rosuvastatin  20 mg Oral Daily   sertraline  100 mg Oral QHS   Continuous Infusions:  sodium chloride 100 mL/hr at 12/16/21 1110     LOS: 0 days   Hughie Closs, MD [facility]  12/16/2021, 11:48 AM   *Please note that this is a verbal dictation therefore any spelling or grammatical errors are due to the "Dragon [employer] One" system interpretation.  Please page via Amion and do not message via secure chat for urgent patient care matters. Secure chat can be used for non urgent patient care matters.  How to contact the [facility] Attending  or Consulting provider 7A -  7P or covering provider during after hours 7P -7A, for this patient?  Check the care team in [facility] and look for a) attending/consulting [facility] provider listed and b) the [facility] team listed. Page or secure chat 7A-7P. Log into [email or web address] and use [facility]'s universal password to access. If you do not have the password, please contact the hospital operator. Locate the [facility] provider you are looking for under [facility] and page to a number that you can be directly reached. If you still have difficulty reaching the provider, please page the [facility] (Director on Call) for the Hospitalists listed on amion for assistance.

## 2021-12-16 NOTE — Progress Notes (Signed)
Pt stating she has had severe left hip pain and lower back pain since she was in the chair earlier today. She believes she was in the chair too long. She denies prior hip issues but endorses chronic lower back pain. When repositioning pt in the bed, she cries out in pain she she attempts to move her left leg. Pt states she does not think she can walk anymore d/t pain. Dr. Margo Aye notified.

## 2021-12-16 NOTE — Plan of Care (Signed)
  Problem: Education: Goal: Knowledge of disease or condition will improve Outcome: Progressing Goal: Knowledge of secondary prevention will improve (MUST DOCUMENT ALL) Outcome: Progressing Goal: Knowledge of patient specific risk factors will improve Loraine Leriche N/A or DELETE if not current risk factor) Outcome: Progressing   Problem: Ischemic Stroke/TIA Tissue Perfusion: Goal: Complications of ischemic stroke/TIA will be minimized Outcome: Progressing   Problem: Coping: Goal: Will verbalize positive feelings about self Outcome: Progressing Goal: Will identify appropriate support needs Outcome: Progressing   Problem: Health Behavior/Discharge Planning: Goal: Ability to manage health-related needs will improve Outcome: Progressing Goal: Goals will be collaboratively established with patient/family Outcome: Progressing   Problem: Self-Care: Goal: Ability to participate in self-care as condition permits will improve Outcome: Progressing Goal: Verbalization of feelings and concerns over difficulty with self-care will improve Outcome: Progressing Goal: Ability to communicate needs accurately will improve Outcome: Progressing   Problem: Nutrition: Goal: Risk of aspiration will decrease Outcome: Progressing Goal: Dietary intake will improve Outcome: Progressing   Problem: Education: Goal: Knowledge of General Education information will improve Description: Including pain rating scale, medication(s)/side effects and non-pharmacologic comfort measures Outcome: Progressing   Problem: Health Behavior/Discharge Planning: Goal: Ability to manage health-related needs will improve Outcome: Progressing   Problem: Clinical Measurements: Goal: Ability to maintain clinical measurements within normal limits will improve Outcome: Progressing Goal: Will remain free from infection Outcome: Progressing Goal: Diagnostic test results will improve Outcome: Progressing Goal: Cardiovascular  complication will be avoided Outcome: Progressing   Problem: Activity: Goal: Risk for activity intolerance will decrease Outcome: Progressing   Problem: Nutrition: Goal: Adequate nutrition will be maintained Outcome: Progressing   Problem: Elimination: Goal: Will not experience complications related to bowel motility Outcome: Progressing Goal: Will not experience complications related to urinary retention Outcome: Progressing   Problem: Pain Managment: Goal: General experience of comfort will improve Outcome: Progressing   Problem: Safety: Goal: Ability to remain free from injury will improve Outcome: Progressing   Problem: Skin Integrity: Goal: Risk for impaired skin integrity will decrease Outcome: Progressing

## 2021-12-16 NOTE — Evaluation (Signed)
Occupational Therapy Evaluation Patient Details Name: [name] MRN: [id number] DOB: [birth year] Today's Date: 12/16/2021   History of Present Illness [name] is a 87 y.o. female with medical history significant of hypertension, paroxysmal atrial fibrillation not on anticoagulation, left carotid artery stenosis s/p endarterectomy, diastolic CHF, COPD, and small bowel obstruction who presented with complaints of dizziness and nausea and found to have small cerebellar CVA.   Clinical Impression   PTA patient was living alone in a private residence with her dog and was grossly Mod I with ADLs/IADLs with use of AD. Patient currently functioning below baseline demonstrating observed ADLs including toilet transfers/toileting, LB bathing/dressing and grooming with Min-Mod A and use of RW. Patient also limited by deficits listed below including dizziness, nausea/vomiting, generalized weakness, and decreased static/dynamic standing balance and would benefit from continued acute OT services in prep for safe d/c to next level of care. Patient and her niece would prefer d/c to inpatient rehab as patient is a good candidate given high PLOF and motivation to reach goals. Niece reports ability to provide necessary supervision/assist s/p d/c from rehab. OT will continue to follow acutely.        Recommendations for follow up therapy are one component of a multi-disciplinary discharge planning process, led by the attending physician.  Recommendations may be updated based on patient status, additional functional criteria and insurance authorization.   Follow Up Recommendations  Acute inpatient rehab (3hours/day)     Assistance Recommended at Discharge Frequent or constant Supervision/Assistance  Patient can return home with the following A little help with walking and/or transfers;A lot of help with bathing/dressing/bathroom;Assistance with cooking/housework;Assist for transportation;Help with stairs or ramp  for entrance    Functional Status Assessment  Patient has had a recent decline in their functional status and demonstrates the ability to make significant improvements in function in a reasonable and predictable amount of time.  Equipment Recommendations  BSC/3in1;Tub/shower seat (Rolling walker)    Recommendations for Other Services Rehab consult     Precautions / Restrictions Precautions Precautions: Fall Precaution Comments: dizziness; N/V (no episodes of emesis during OT eval 11/25) Restrictions Weight Bearing Restrictions: No      Mobility Bed Mobility Overal bed mobility: Needs Assistance Bed Mobility: Supine to Sit     Supine to sit: HOB elevated, Min assist     General bed mobility comments: assist for balance, cues for visual target due to dizziness    Transfers Overall transfer level: Needs assistance Equipment used: Rolling walker (2 wheels) Transfers: Sit to/from Stand Sit to Stand: Mod assist           General transfer comment: lifting help and cues for visual target due to dizziness      Balance Overall balance assessment: Needs assistance Sitting-balance support: Feet supported Sitting balance-Leahy Scale: Fair Sitting balance - Comments: on EOB with S for balance, no LOB   Standing balance support: Bilateral upper extremity supported Standing balance-Leahy Scale: Poor Standing balance comment: UE support and min A for balance                           ADL either performed or assessed with clinical judgement   ADL Overall ADL's : Needs assistance/impaired Eating/Feeding: Set up Eating/Feeding Details (indicate cue type and reason): Set-up A to open packages Grooming: Wash/dry hands;Wash/dry face;Minimal assistance Grooming Details (indicate cue type and reason): Min A for steadying standing at sink level. Upper Body Bathing: Minimal assistance;Sitting  Lower Body Bathing: Moderate assistance;Sit to/from stand Lower Body  Bathing Details (indicate cue type and reason): Mod A in sitting/standing secondary to balance deficits and generalized weakness. Upper Body Dressing : Minimal assistance;Sitting   Lower Body Dressing: Moderate assistance;Sit to/from stand Lower Body Dressing Details (indicate cue type and reason): Mod A to thread BLE through mesh underwear and hike over hips in standing. Toilet Transfer: Minimal assistance;Rolling walker (2 wheels) Toilet Transfer Details (indicate cue type and reason): To standard height commode in bathroom with Min A and use of RW. Additional assist for controlled descent with cues for use of grab bar. Toileting- Clothing Manipulation and Hygiene: Minimal assistance;Sit to/from stand Toileting - Clothing Manipulation Details (indicate cue type and reason): Min A for hygiene/clothing management in standing.             Vision Baseline Vision/History: 1 Wears glasses Vision Assessment?: Vision impaired- to be further tested in functional context Additional Comments: Difficult to assess 2/2 increased dizziness with saccadic eye movements     Perception     Praxis Praxis Praxis tested?: Within functional limits    Pertinent Vitals/Pain Pain Assessment Pain Assessment: No/denies pain     Hand Dominance Right   Extremity/Trunk Assessment Upper Extremity Assessment Upper Extremity Assessment: Generalized weakness   Lower Extremity Assessment Lower Extremity Assessment: Defer to PT evaluation   Cervical / Trunk Assessment Cervical / Trunk Assessment: Kyphotic   Communication Communication Communication: HOH (wears hearing aides, but does not have them)   Cognition Arousal/Alertness: Awake/alert Behavior During Therapy: Anxious Overall Cognitive Status: Within Functional Limits for tasks assessed                                       General Comments  VSS on RA. Niece present at bedside for portion of evaluation.    Exercises      Shoulder Instructions      Home Living Family/patient expects to be discharged to:: Private residence Living Arrangements: Alone Available Help at Discharge: Family;Available 24 hours/day (states neice or step daughter could stay) Type of Home: House Home Access: Stairs to enter [employer] of Steps: 3 Entrance Stairs-Rails: Left;Right Home Layout: One level     Bathroom Shower/Tub: Producer, television/film/video: Standard     Home Equipment: Agricultural consultant (2 wheels);Cane - single point          Prior Functioning/Environment Prior Level of Function : Independent/Modified Independent             Mobility Comments: uses cane intermittently, reports her RW goes crooked          OT Problem List: Decreased strength;Impaired balance (sitting and/or standing);Decreased knowledge of use of DME or AE      OT Treatment/Interventions: Self-care/ADL training;Therapeutic exercise;Energy conservation;DME and/or AE instruction;Therapeutic activities;Patient/family education;Balance training    OT Goals(Current goals can be found in the care plan section) Acute Rehab OT Goals Patient Stated Goal: To go to rehab OT Goal Formulation: With patient Time For Goal Achievement: 12/30/21 Potential to Achieve Goals: Good ADL Goals Pt Will Perform Grooming: standing;with supervision Pt Will Perform Lower Body Dressing: sit to/from stand;with supervision Pt Will Transfer to Toilet: with supervision;bedside commode;ambulating Additional ADL Goal #1: Patient will independently recall and demo gaze stabilization techniques in prep for ADLs/ADL transfers.  OT Frequency: Min 2X/week    Co-evaluation  AM-PAC OT "6 Clicks" Daily Activity     Outcome Measure Help from another person eating meals?: None Help from another person taking care of personal grooming?: A Little Help from another person toileting, which includes using toliet, bedpan, or urinal?: A  Little Help from another person bathing (including washing, rinsing, drying)?: A Lot Help from another person to put on and taking off regular upper body clothing?: A Little Help from another person to put on and taking off regular lower body clothing?: A Lot 6 Click Score: 17   End of Session Equipment Utilized During Treatment: Gait belt;Rolling walker (2 wheels) Nurse Communication: Mobility status  Activity Tolerance: Patient tolerated treatment well Patient left: in chair;with call bell/phone within reach;with chair alarm set  OT Visit Diagnosis: Unsteadiness on feet (R26.81);Muscle weakness (generalized) (M62.81);Dizziness and giddiness (R42)                Time: 1610-9604 OT Time Calculation (min): 31 min Charges:  OT General Charges $OT Visit: 1 Visit OT Evaluation $OT Eval Low Complexity: 1 Low OT Treatments $Self Care/Home Management : 8-22 mins  Lillian Ballester H. OTR/L Supplemental OT, Department of rehab services [phone]  Analiah Drum R H. 12/16/2021, 9:45 AM

## 2021-12-16 NOTE — Discharge Instructions (Signed)

## 2021-12-16 NOTE — Progress Notes (Signed)
Physical Therapy Treatment Patient Details Name: [name] MRN: [id number] DOB: December 30, 1934 Today's Date: 12/16/2021   History of Present Illness [name] is a 87 y.o. female with medical history significant of hypertension, paroxysmal atrial fibrillation not on anticoagulation, left carotid artery stenosis s/p endarterectomy, diastolic CHF, COPD, and small bowel obstruction who presented with complaints of dizziness and nausea and found to have small cerebellar CVA.    PT Comments    Patient seen for second session for mobility, however her vertigo had only decreased from 8 to 7/10 with ~2 hour break. Able to mobilize with mod assist and attempted use of target fixed to RW to see if it improved her ability to keep eyes fixed and lessen symptoms (which it did not help). Continues to need mod assist for balance with use of RW. Noted that family can provide 24/7 assist and updated discharge recommendation to [facility] as pt would be an excellent candidate.     Recommendations for follow up therapy are one component of a multi-disciplinary discharge planning process, led by the attending physician.  Recommendations may be updated based on patient status, additional functional criteria and insurance authorization.  Follow Up Recommendations  Acute inpatient rehab (3hours/day) Can patient physically be transported by private vehicle: No   Assistance Recommended at Discharge Frequent or constant Supervision/Assistance  Patient can return home with the following Assistance with cooking/housework;Assist for transportation;Assistance with feeding;Help with stairs or ramp for entrance;Direct supervision/assist for financial management;A lot of help with walking and/or transfers   Equipment Recommendations  Rolling walker (2 wheels)    Recommendations for Other Services       Precautions / Restrictions Precautions Precautions: Fall Precaution Comments: dizziness; N/V Restrictions Weight Bearing  Restrictions: No     Mobility  Bed Mobility               General bed mobility comments: up in recliner    Transfers Overall transfer level: Needs assistance Equipment used: Rolling walker (2 wheels) Transfers: Sit to/from Stand Sit to Stand: Mod assist           General transfer comment: lifting help and cues for visual target due to dizziness    Ambulation/Gait Ambulation/Gait assistance: Mod assist Gait Distance (Feet): 25 Feet Assistive device: Rolling walker (2 wheels) Gait Pattern/deviations: Decreased stride length, Trunk flexed, Wide base of support, Step-to pattern Gait velocity: significantly decrease Gait velocity interpretation: <1.31 ft/sec, indicative of household ambulator   General Gait Details: cues throughout for visual target (placed target on RW to try to incr her ability to stay on target and reap benefit of visual fixation). Patient able to keep eyes on this target, however increased to 8/10 dizziness wiht short distance ambulation. Continued to require mod assist for balance.   Stairs             Wheelchair Mobility    Modified Rankin (Stroke Patients Only) Modified Rankin (Stroke Patients Only) Pre-Morbid Rankin Score: No significant disability Modified Rankin: Moderately severe disability     Balance Overall balance assessment: Needs assistance Sitting-balance support: Feet supported Sitting balance-Leahy Scale: Fair Sitting balance - Comments: on EOB with S for balance, no LOB   Standing balance support: Bilateral upper extremity supported Standing balance-Leahy Scale: Poor Standing balance comment: UE support and min A for balance                            Cognition Arousal/Alertness: Awake/alert Behavior During  Therapy: Anxious Overall Cognitive Status: Within Functional Limits for tasks assessed                                          Exercises Other Exercises Other Exercises: While  reclined, turning head to left and focus on target, turn back to center and focus, repeat to rt. Repeat as many reps as able (completed 2 during session with dizziness 8/10)--but also done after walking with dizziness already elevated.    General Comments General comments (skin integrity, edema, etc.): Niece present and confirms pt will have 24/7 assist if needed on discharge      Pertinent Vitals/Pain Pain Assessment Pain Assessment: 0-10 Pain Score: 4  Faces Pain Scale: Hurts little more Pain Location: headache Pain Descriptors / Indicators: Headache Pain Intervention(s): Limited activity within patient's tolerance, Monitored during session    Home Living Family/patient expects to be discharged to:: Private residence Living Arrangements: Alone Available Help at Discharge: Family;Available 24 hours/day (states neice or step daughter could stay) Type of Home: House Home Access: Stairs to enter Entrance Stairs-Rails: [employer] of Steps: 3   Home Layout: One level Home Equipment: Agricultural consultant (2 wheels);Cane - single point      Prior Function            PT Goals (current goals can now be found in the care plan section) Acute Rehab PT Goals Patient Stated Goal: to return to independent PT Goal Formulation: With patient Time For Goal Achievement: 12/29/21 Potential to Achieve Goals: Good    Frequency    Min 3X/week      PT Plan Discharge plan needs to be updated    Co-evaluation              AM-PAC PT "6 Clicks" Mobility   Outcome Measure  Help needed turning from your back to your side while in a flat bed without using bedrails?: A Lot Help needed moving from lying on your back to sitting on the side of a flat bed without using bedrails?: A Lot Help needed moving to and from a bed to a chair (including a wheelchair)?: A Lot Help needed standing up from a chair using your arms (e.g., wheelchair or bedside chair)?: A Lot Help needed  to walk in hospital room?: A Lot Help needed climbing 3-5 steps with a railing? : Total 6 Click Score: 11    End of Session Equipment Utilized During Treatment: Gait belt Activity Tolerance: Treatment limited secondary to medical complications (Comment) (vertigo and nause) Patient left: with call bell/phone within reach;in chair;with chair alarm set Nurse Communication: Mobility status PT Visit Diagnosis: Other abnormalities of gait and mobility (R26.89);Dizziness and giddiness (R42);Muscle weakness (generalized) (M62.81);Other symptoms and signs involving the nervous system (W09.811)     Time: 9147-8295 PT Time Calculation (min) (ACUTE ONLY): 20 min  Charges:  $Gait Training: 8-22 mins                      Jerolyn Center, PT Acute Rehabilitation Services  Office [phone]    Zena Amos 12/16/2021, 12:35 PM

## 2021-12-16 NOTE — Progress Notes (Signed)
Inpatient Rehab Admissions Coordinator:  ? ?Per therapy recommendations,  patient was screened for CIR candidacy by Latavion Halls, MS, CCC-SLP. At this time, Pt. Appears to be a a potential candidate for CIR. I will place   order for rehab consult per protocol for full assessment. Please contact me any with questions. ? ?Mozell Hardacre, MS, CCC-SLP ?Rehab Admissions Coordinator  ?[phone] (celll) ?[phone] (office) ? ?

## 2021-12-16 NOTE — Plan of Care (Signed)
  Problem: Education: Goal: Knowledge of disease or condition will improve Outcome: Progressing Goal: Knowledge of secondary prevention will improve (MUST DOCUMENT ALL) Outcome: Progressing Goal: Knowledge of patient specific risk factors will improve Loraine Leriche N/A or DELETE if not current risk factor) Outcome: Progressing   Problem: Ischemic Stroke/TIA Tissue Perfusion: Goal: Complications of ischemic stroke/TIA will be minimized Outcome: Progressing   Problem: Coping: Goal: Will verbalize positive feelings about self Outcome: Progressing Goal: Will identify appropriate support needs Outcome: Progressing   Problem: Health Behavior/Discharge Planning: Goal: Ability to manage health-related needs will improve Outcome: Progressing Goal: Goals will be collaboratively established with patient/family Outcome: Progressing   Problem: Self-Care: Goal: Ability to participate in self-care as condition permits will improve Outcome: Progressing Goal: Verbalization of feelings and concerns over difficulty with self-care will improve Outcome: Progressing Goal: Ability to communicate needs accurately will improve Outcome: Progressing   Problem: Nutrition: Goal: Risk of aspiration will decrease Outcome: Progressing Goal: Dietary intake will improve Outcome: Progressing   Problem: Education: Goal: Knowledge of General Education information will improve Description: Including pain rating scale, medication(s)/side effects and non-pharmacologic comfort measures Outcome: Progressing   Problem: Health Behavior/Discharge Planning: Goal: Ability to manage health-related needs will improve Outcome: Progressing   Problem: Clinical Measurements: Goal: Ability to maintain clinical measurements within normal limits will improve Outcome: Progressing Goal: Will remain free from infection Outcome: Progressing Goal: Diagnostic test results will improve Outcome: Progressing Goal: Cardiovascular  complication will be avoided Outcome: Progressing   Problem: Activity: Goal: Risk for activity intolerance will decrease Outcome: Progressing   Problem: Nutrition: Goal: Adequate nutrition will be maintained Outcome: Progressing   Problem: Coping: Goal: Level of anxiety will decrease Outcome: Progressing   Problem: Elimination: Goal: Will not experience complications related to bowel motility Outcome: Progressing Goal: Will not experience complications related to urinary retention Outcome: Progressing   Problem: Pain Managment: Goal: General experience of comfort will improve Outcome: Progressing   Problem: Safety: Goal: Ability to remain free from injury will improve Outcome: Progressing   Problem: Skin Integrity: Goal: Risk for impaired skin integrity will decrease Outcome: Progressing

## 2021-12-16 NOTE — Progress Notes (Addendum)
Physical Therapy Vestibular Evaluation  Clinical impression: Patient seen shortly after up with OT and remained very symptomatic from their session and quickly worsened with assessment. Tests completed were all + as you would expect from central vestibular deficit/CVA. Patient unable to tolerate education on simple exercise to work on throughout the day. Will return later to address mobility and teach vestibular exercise.    12/16/21 1000  Vestibular Assessment  General Observation sitting in recliner, holding onto armrests and reports dizziness 7/10, nausea 2/10  Symptom Behavior  Type of Dizziness  Imbalance (has difficulty describing but denies spinnin or objects moving)  Frequency of Dizziness daily since began  Duration of Dizziness constant  Symptom Nature Constant  Aggravating Factors Activity in general  Relieving Factors Head stationary;Closing eyes  Progression of Symptoms Better  History of similar episodes no  Oculomotor Exam  Oculomotor Alignment Normal  Ocular ROM normal  Spontaneous Absent  Smooth Pursuits Saccades  Saccades Undershoots (to both sides but worse to left)  Vestibulo-Ocular Reflex  VOR 1 Head Only (x 1 viewing) slowly x 4 reps and dizziness 8/10 and stopped  Comment deferred VOR cancellation as expect it to be positive with Central vestibular deficit and pt up to 8/10 dizziness  Other Tests  Comments denies changes    Jerolyn Center, PT Acute Rehabilitation Services  Office [phone]   12/16/21 1000  PT Visit Information  Last PT Received On 12/16/21  PT Time Calculation  PT Start Time (ACUTE ONLY) 0942  PT Stop Time (ACUTE ONLY) 0952  PT Time Calculation (min) (ACUTE ONLY) 10 min  PT General Charges  $$ ACUTE PT VISIT 1 Visit  PT Treatments  $Neuromuscular Re-education 8-22 mins

## 2021-12-16 NOTE — NC FL2 (Signed)
[place] MEDICAID FL2 LEVEL OF CARE SCREENING TOOL     IDENTIFICATION  Patient Name: [name] Birthdate: 06-07-1934 Sex: female Admission Date (Current Location): 12/14/2021  [facility] and [place] Number:  Producer, television/film/video and Address:  [facility] [place], [address] [place], [place] [address]      Provider Number: [id number]  Attending Physician Name and Address:  Hughie Closs, MD  Relative Name and Phone Number:       Current Level of Care: Hospital Recommended Level of Care: Skilled Nursing Facility Prior Approval Number:    Date Approved/Denied:   PASRR Number: [id number]  Discharge Plan: SNF    Current Diagnoses: Patient Active Problem List   Diagnosis Date Noted   Vertigo 12/14/2021   CVA (cerebral vascular accident) (HCC) 12/14/2021   Nausea and vomiting 12/14/2021   Hypokalemia 12/14/2021   Leukocytosis 12/14/2021   Essential hypertension 12/14/2021   Anxiety 12/14/2021   Hyperlipidemia 12/14/2021   Carotid artery disease (HCC) 12/14/2021   Osteoarthritis cervical spine 09/21/2020   Compression fracture of T12 vertebra (HCC) 04/16/2018   Paroxysmal atrial fibrillation (HCC) 03/03/2018   SBO (small bowel obstruction) (HCC) 01/26/2017   Encounter for postoperative carotid endarterectomy surveillance 09/21/2015   Occlusion and stenosis of carotid artery without mention of cerebral infarction 02/05/2012    Orientation RESPIRATION BLADDER Height & Weight     Self, Time, Situation, Place  Normal Continent, External catheter Weight: 152 lb 1.9 oz (69 kg) Height:  5' 4.5" (163.8 cm)  BEHAVIORAL SYMPTOMS/MOOD NEUROLOGICAL BOWEL NUTRITION STATUS      Continent Diet (See dc summary)  AMBULATORY STATUS COMMUNICATION OF NEEDS Skin   Limited Assist Verbally Normal                       Personal Care Assistance Level of Assistance  Bathing, Feeding, Dressing Bathing Assistance: Limited assistance Feeding assistance: Limited  assistance Dressing Assistance: Limited assistance     Functional Limitations Info  Sight, Hearing Sight Info: Impaired Hearing Info: Impaired      SPECIAL CARE FACTORS FREQUENCY  PT (By licensed PT), OT (By licensed OT)     PT Frequency: 5x/week OT Frequency: 5x/week            Contractures Contractures Info: Not present    Additional Factors Info  Code Status, Allergies, Psychotropic Code Status Info: Full Allergies Info: Amoxicillin, Tetracyclines & Related Psychotropic Info: Zoloft         Current Medications (12/16/2021):  This is the current hospital active medication list Current Facility-Administered Medications  Medication Dose Route Frequency Provider Last Rate Last Admin   0.9 %  sodium chloride infusion   Intravenous Continuous Pahwani, Ravi, MD       acetaminophen (TYLENOL) tablet 650 mg  650 mg Oral Q4H PRN Madelyn Flavors A, MD   650 mg at 12/15/21 2046   Or   acetaminophen (TYLENOL) 160 MG/5ML solution 650 mg  650 mg Per Tube Q4H PRN Madelyn Flavors A, MD       Or   acetaminophen (TYLENOL) suppository 650 mg  650 mg Rectal Q4H PRN Madelyn Flavors A, MD       apixaban (ELIQUIS) tablet 5 mg  5 mg Oral BID Hughie Closs, MD   5 mg at 12/16/21 1008   hydrALAZINE (APRESOLINE) injection 5 mg  5 mg Intravenous Q6H PRN Dow Adolph N, DO       oxybutynin (DITROPAN) tablet 5 mg  5  mg Oral Daily Pahwani, Daleen Bo, MD   5 mg at 12/16/21 1007   pantoprazole (PROTONIX) EC tablet 40 mg  40 mg Oral Daily Pahwani, Daleen Bo, MD   40 mg at 12/16/21 1007   prochlorperazine (COMPAZINE) injection 5 mg  5 mg Intravenous Q6H PRN Dow Adolph N, DO   5 mg at 12/15/21 2222   rosuvastatin (CRESTOR) tablet 20 mg  20 mg Oral Daily Pahwani, Daleen Bo, MD   20 mg at 12/16/21 1008   senna-docusate (Senokot-S) tablet 1 tablet  1 tablet Oral QHS PRN Madelyn Flavors A, MD       sertraline (ZOLOFT) tablet 100 mg  100 mg Oral QHS Hughie Closs, MD   100 mg at 12/15/21 2043     Discharge  Medications: Please see discharge summary for a list of discharge medications.  Relevant Imaging Results:  Relevant Lab Results:   Additional Information SSN: [id number].  Mearl Latin, LCSW

## 2021-12-16 NOTE — TOC Initial Note (Signed)
Transition of Care [facility]) - Initial/Assessment Note    Patient Details  Name: [name] MRN: [id number] Date of Birth: 09/13/1934  Transition of [facility]) CM/SW Contact:    Mearl Latin, LCSW Phone Number: 12/16/2021, 10:07 AM  Clinical Narrative:                 CSW received consult for possible SNF placement at time of discharge. CSW spoke with patient and her niece, [name], at discharge. [name] reported that therapy had just been by and felt like she would qualify for inpatient rehab. CSW explained that could be a possibility if patient is eligible to be switched to inpatient status. She expressed they would be agreeable to SNF placement at time of discharge only if CIR is not an option but CSW will send referrals just in case. CSW discussed insurance authorization process and will provide Medicare SNF ratings list.   Skilled Nursing Rehab Facilities-   [email or web address]   Ratings out of 5 stars (5 the highest)   Name Address  Phone # Quality Care Staffing Health Inspection Overall  [facility] [address] [place] [phone] 4 5 2 3   [facility]  [address], [place] [phone] 4 2 5 5   [facility] [address] [place], [address] [place] 1 3 1 1   [facility] [address] [place] 2 2 4 4   [facility] [address] [phone] 2 1 2 1   [facility] [address] 636 [address], [phone] 3 3 4 4   [facility] [place] [address], [address] [place] 4 1 3 2   [facility] [address] [facility] [place] 4 1 3 2   [address] ([facility]) [address], [facility] 3 1 2 1   [facility] [address], [address] [phone] 3 1 1 1   [facility] [address], [facility] [phone] 3 2 2 2   [facility] ([place]) [address] , Ginette Otto [phone] 3 1 1 1   [address] [facility] 4 2 4 4           [facility] [address], [facility] [place]      [facility] [address] [phone] 4 1 3 2   [facility] [place] [address], [address] [phone] 3 1 5 4   [address] [place] [facility] [address], [place] [phone] 1 1 2 1   [facility] [address], [place] [phone] 2 2 4 4           [address] (no [facility]) [address], [place] [phone] 5 5 5 5   [facility] (No Humana) [address] [place] 4 1 4 3   [facility] (No UHC) [address], [place] [phone] 5 5 5 5   [facility] [address], [address] [phone] 2 3 5 5   [facility] [address] 1 1 2 1   [facility] [address] [place] [phone] 3 1 1 1   [place] [address] [place] [phone] 5 2 5 5   [facility]  [address], [place] 2 2 1 1   [facility] [address], [facility] [place] 3 2 1 1   [facility] [address] [place], [address] [place] 2 2 2 2           [facility] [address], [place] [phone] 1 1 1 1   [place] [address], Evlyn Clines  [phone] 2 4 3 3   [facility] [place] [address], [address] [phone] 3 2 3 3   [facility] [place] [address] [place] [phone] 2 1 1 1   [facility] (No Humana) [address] [place], [phone] 2 2 3 3   [facility]) [address], [place] [phone] 2 1 1 1           [facility] [address] [place], [phone] 5 4 5 5   [facility])  [address] [place] [phone] 2 1 2 1   [facility]) [address] [facility] [address] 3 1 4 3   [facility] [place] [address] [facility] [place] 3 3 4 4   [address] [place], [facility] [place] 2 3 1 1    [facility]) [address] [place] [phone] 2 1 4 3      Expected Discharge Plan: IP Rehab Facility Barriers to Discharge: Continued Medical Work up, Insurance Authorization   Patient Goals and CMS Choice Patient states their goals for this hospitalization and ongoing recovery are:: Rehab CMS Medicare.gov Compare Post Acute Care list provided to:: Patient Choice offered to / list presented to : Patient (Niece)  Expected Discharge Plan and Services Expected Discharge Plan: IP Rehab Facility In-house Referral: Clinical Social Work   Post Acute Care Choice: IP Rehab Living arrangements for the past 2 months: Single Family Home                                      Prior Living Arrangements/Services Living arrangements for the past 2 months: Single Family Home Lives with:: Self, Pets Patient language and need for interpreter reviewed:: Yes Do you feel safe going back to the place where you live?: Yes      Need for Family Participation in Patient Care: Yes (Comment) Care giver support system in place?: Yes (comment)   Criminal Activity/Legal Involvement Pertinent to Current Situation/Hospitalization: No - Comment as needed  Activities of Daily Living      Permission Sought/Granted Permission sought to share information with : Facility , Family Supports Permission granted to share information with : Yes, Verbal Permission Granted  Share Information with NAME: Lewayne Bunting  Permission granted to share info w AGENCY: SNFs  Permission granted to share info w Relationship: Niece  Permission granted to share info w Contact Information: [phone]  Emotional Assessment Appearance:: Appears stated age Attitude/Demeanor/Rapport: Engaged [facility]) Affect (typically observed): Accepting, Appropriate Orientation: : Oriented to Self, Oriented to Place, Oriented to  Time, Oriented to Situation Alcohol / Substance Use: Not  Applicable Psych Involvement: No (comment)  Admission diagnosis:  Hypokalemia [E87.6] Vertigo [R42] Paroxysmal atrial fibrillation (HCC) [I48.0] Intractable nausea and vomiting [R11.2] Patient Active Problem List   Diagnosis Date Noted   Vertigo 12/14/2021   CVA (cerebral vascular accident) (HCC) 12/14/2021   Nausea and vomiting 12/14/2021   Hypokalemia 12/14/2021   Leukocytosis 12/14/2021   Essential hypertension 12/14/2021   Anxiety 12/14/2021   Hyperlipidemia 12/14/2021   Carotid artery disease (HCC) 12/14/2021   Osteoarthritis cervical spine 09/21/2020   Compression fracture of T12 vertebra (HCC) 04/16/2018   Paroxysmal atrial fibrillation (HCC) 03/03/2018   SBO (small bowel obstruction) (HCC) 01/26/2017   Encounter for postoperative carotid endarterectomy surveillance 09/21/2015   Occlusion and stenosis of carotid artery without mention of cerebral infarction 02/05/2012  PCP:  Blair Heys, MD Pharmacy[facility]   [facility] [address], [place] - [address] [facility] [address] [facility] [address] [place] [address] Phone: [phone] Fax: [phone]  [facility]) - [place], [place] - [address] [facility] [address] [place] [address] [place] [address] Phone: [phone] Fax: [phone]     Social Determinants of Health (SDOH) Interventions    Readmission Risk Interventions     No data to display

## 2021-12-17 DIAGNOSIS — R42 Dizziness and giddiness: Secondary | ICD-10-CM | POA: Diagnosis not present

## 2021-12-17 LAB — BASIC METABOLIC PANEL
Anion gap: 12 (ref 5–15)
BUN: 13 mg/dL (ref 8–23)
CO2: 25 mmol/L (ref 22–32)
Calcium: 8.9 mg/dL (ref 8.9–10.3)
Chloride: 99 mmol/L (ref 98–111)
Creatinine, Ser: 0.91 mg/dL (ref 0.44–1.00)
GFR, Estimated: >60 mL/min (ref >60)
Glucose, Bld: 121 mg/dL — ABNORMAL HIGH (ref 70–99)
Potassium: 3.6 mmol/L (ref 3.5–5.1)
Sodium: 136 mmol/L (ref 135–145)

## 2021-12-17 MED ORDER — ATENOLOL 25 MG PO TABS
12.5000 mg | ORAL_TABLET | Freq: Every day | ORAL | Status: DC
  Administered 2021-12-17 – 2021-12-20 (×4): 12.5 mg via ORAL
  Filled 2021-12-17 (×4): qty 1

## 2021-12-17 MED ORDER — METOPROLOL TARTRATE 5 MG/5ML IV SOLN
2.5000 mg | Freq: Four times a day (QID) | INTRAVENOUS | Status: DC | PRN
  Administered 2021-12-17: 2.5 mg via INTRAVENOUS
  Filled 2021-12-17: qty 5

## 2021-12-17 MED ORDER — LIDOCAINE 5 % EX PTCH
1.0000 | MEDICATED_PATCH | Freq: Every day | CUTANEOUS | Status: DC
  Administered 2021-12-17 – 2021-12-19 (×4): 1 via TRANSDERMAL
  Filled 2021-12-17 (×4): qty 1

## 2021-12-17 MED ORDER — AMLODIPINE BESYLATE 10 MG PO TABS
10.0000 mg | ORAL_TABLET | Freq: Every day | ORAL | Status: DC
  Administered 2021-12-17 – 2021-12-20 (×4): 10 mg via ORAL
  Filled 2021-12-17 (×4): qty 1

## 2021-12-17 NOTE — Progress Notes (Signed)
PROGRESS NOTE    [name]  YFV:[id number] DOB: 1934/04/18 DOA: 12/14/2021 PCP: Blair Heys, MD   Brief Narrative:  HPI: [name] is a 87 y.o. female with medical history significant of hypertension, paroxysmal atrial fibrillation not on anticoagulation, left carotid artery stenosis s/p endarterectomy, diastolic CHF, COPD, and small bowel obstruction who presented with complaints of dizziness and nausea that started this morning at 3 am. She states that she was woken out of her sleep with the symptoms and was unable to get herself out of bed.  Unable to describe dizziness in further detail at this time.  She reported having several episodes of vomiting.  Emesis was noted to be of stomach contents and denies any blood present.  Denies any significant abdominal pain.  She had gone to bed yesterday evening in her normal state of health prior to midnight. She reports remote history of being on Coumadin, but stopped taking the medicine due to her sister previously almost bleeding out while on the medication.  To her knowledge she had not eaten anything out of the norm.  Patient does make note that she fell over someone sometime last week and hit her head, but denied sustaining any significant injury.   In route with EMS patient was noted to have an irregular heart rhythm 120s, blood pressure 180/100, and all other vital signs maintained.      In the emergency department patient was noted to be in atrial fibrillation with blood pressures elevated up to 172/91, and O2 saturations maintained on room air.  Labs significant for WBC 10.8 (appears chronic), potassium 2.8, and glucose 177.  CTA of the head and neck noted no large vessel occlusion, but complex plaque in the left ICA origin, and concern for right cervical ICA fibromuscular dysplasia.  Patient has been given Ativan, Zofran,  potassium chloride 30 mEq IV, and 1 L of normal saline IV fluids.  Assessment & Plan:   Principal Problem:    Vertigo Active Problems:   CVA (cerebral vascular accident) (HCC)   Nausea and vomiting   Paroxysmal atrial fibrillation (HCC)   Hypokalemia   Leukocytosis   Essential hypertension   Anxiety   Carotid artery disease (HCC)   Hyperlipidemia   Acute ischemic stroke (HCC)   CVA /acute cerebellar stroke:  CT angiogram of the head and neck did not note any acute abnormality.  Neurology consulted and MRI of the brain did not reveal any acute signs of a stroke, but neurology felt patient actually had a tiny right cerebellar stroke.  Patient still complains of intermittent nausea.  Echo shows normal ejection fraction, grade 1 diastolic dysfunction.  No other abnormality. CTA head & neck negative for LVO, but complex plaque at the left ICA origin with possible adherent thrombus within the vessel lumen, 60% left ICA stenosis, right cervical ICA fibromuscular dysplasia.  Patient has been started on Eliquis, aspirin has been discontinued.  LDL<70, patient on 20 mg of Crestor.  PT OT recommends CIR. waiting for placement.   Nausea and vomiting/possible BPPV Improved.  Paroxysmal atrial fibrillation Patient appears to be in atrial fibrillation and has been rate controlled with atenolol.  She had previously been on Coumadin, but stopped taking the medication after her sister had a significant bleeding event while on the medication.  Patient was just on aspirin.  CHA2DS2-VASc score =6-7.  Family is concerned with her being on blood thinners that she is alone and has frequent falls.  However neurology has started the  patient on Eliquis.   Hypokalemia Resolved.   Leukocytosis Mild, no signs of infection, monitor.   Essential hypertension Blood pressure mostly controlled with intermittent elevation, will resume amlodipine but hold losartan.   Anxiety -Continue Ativan IV and transition back to p.o. once able   Hyperlipidemia Continue Crestor.   Frequent falls Assessment PT OT, CIR  recommended.  DVT prophylaxis: Eliquis   Code Status: Full Code  Family Communication: None present at bedside.  Plan of care discussed with patient in length and he/she verbalized understanding and agreed with it.  Status is: Inpatient Remains inpatient appropriate because: Needs safe discharge/CIR  Estimated body mass index is 25.71 kg/m as calculated from the following:   Height as of this encounter: 5' 4.5" (1.638 m).   Weight as of this encounter: 69 kg.    Nutritional Assessment: Body mass index is 25.71 kg/m.Marland Kitchen Seen by dietician.  I agree with the assessment and plan as outlined below: Nutrition Status:    Skin Assessment: I have examined the patient's skin and I agree with the wound assessment as performed by the wound care RN as outlined below:    Consultants:  Neurology  Procedures:  None  Antimicrobials:  Anti-infectives (From admission, onward)    None         Subjective:  Patient seen and examined.  She has no complaint.  Objective: Vitals:   12/17/21 0452 12/17/21 0507 12/17/21 0539 12/17/21 0628  BP: (!) 128/101 (!) 158/87 128/72 (!) 143/76  Pulse: (!) 155 (!) 161 94 70  Resp:      Temp:      TempSrc:      SpO2: 93% 93% 94%   Weight:      Height:        Intake/Output Summary (Last 24 hours) at 12/17/2021 1147 Last data filed at 12/17/2021 0401 Gross per 24 hour  Intake 855.84 ml  Output 400 ml  Net 455.84 ml    Filed Weights   12/14/21 4825  Weight: 69 kg    Examination:  General exam: Appears calm and comfortable  Respiratory system: Clear to auscultation. Respiratory effort normal. Cardiovascular system: S1 & S2 heard, RRR. No JVD, murmurs, rubs, gallops or clicks. No pedal edema. Gastrointestinal system: Abdomen is nondistended, soft and nontender. No organomegaly or masses felt. Normal bowel sounds heard. Central nervous system: Alert and oriented. No focal neurological deficits. Extremities: Symmetric 5 x 5 power. Skin:  No rashes, lesions or ulcers.  Psychiatry: Judgement and insight appear normal. Mood & affect appropriate.    Data Reviewed: I have personally reviewed following labs and imaging studies  CBC: Recent Labs  Lab 12/14/21 0629 12/14/21 0745 12/16/21 0342  WBC 10.8*  --  13.9*  NEUTROABS 8.4*  --  11.3*  HGB 14.7 14.3 13.4  HCT 43.7 42.0 41.2  MCV 84.5  --  85.5  PLT 301  --  251    Basic Metabolic Panel: Recent Labs  Lab 12/14/21 0629 12/14/21 0745 12/14/21 1347 12/15/21 0743 12/16/21 0342 12/17/21 0339  NA 141 142  --  137 138 136  K 2.8* 2.6*  --  3.7 3.9 3.6  CL 102 103  --  99 99 99  CO2 23  --   --  23 28 25   GLUCOSE 177* 177*  --  107* 105* 121*  BUN 12 12  --  16 18 13   CREATININE 0.95 0.90  --  1.37* 1.23* 0.91  CALCIUM 9.5  --   --  9.0 9.3 8.9  MG  --   --  1.7  --  1.9  --     GFR: Estimated Creatinine Clearance: 43.6 mL/min (by C-G formula based on SCr of 0.91 mg/dL). Liver Function Tests: Recent Labs  Lab 12/14/21 0629  AST 28  ALT 16  ALKPHOS 90  BILITOT 0.6  PROT 8.2*  ALBUMIN 4.2    Recent Labs  Lab 12/14/21 0629  LIPASE 28    No results for input(s): "AMMONIA" in the last 168 hours. Coagulation Profile: Recent Labs  Lab 12/14/21 0629  INR 1.1    Cardiac Enzymes: No results for input(s): "CKTOTAL", "CKMB", "CKMBINDEX", "TROPONINI" in the last 168 hours. BNP (last 3 results) No results for input(s): "PROBNP" in the last 8760 hours. HbA1C: Recent Labs    12/15/21 0244  HGBA1C 5.7*    CBG: No results for input(s): "GLUCAP" in the last 168 hours. Lipid Profile: Recent Labs    12/15/21 0244  CHOL 90  HDL 42  LDLCALC 27  TRIG 103  CHOLHDL 2.1    Thyroid Function Tests: No results for input(s): "TSH", "T4TOTAL", "FREET4", "T3FREE", "THYROIDAB" in the last 72 hours. Anemia Panel: No results for input(s): "VITAMINB12", "FOLATE", "FERRITIN", "TIBC", "IRON", "RETICCTPCT" in the last 72 hours. Sepsis Labs: No results  for input(s): "PROCALCITON", "LATICACIDVEN" in the last 168 hours.  No results found for this or any previous visit (from the past 240 hour(s)).   Radiology Studies: No results found.  Scheduled Meds:  apixaban  5 mg Oral BID   atenolol  12.5 mg Oral Q breakfast   lidocaine  1 patch Transdermal QHS   oxybutynin  5 mg Oral Daily   pantoprazole  40 mg Oral Daily   rosuvastatin  20 mg Oral Daily   sertraline  100 mg Oral QHS   Continuous Infusions:     LOS: 1 day   Hughie Closs, MD [facility]  12/17/2021, 11:47 AM   *Please note that this is a verbal dictation therefore any spelling or grammatical errors are due to the "Dragon Medical One" system interpretation.  Please page via Amion and do not message via secure chat for urgent patient care matters. Secure chat can be used for non urgent patient care matters.  How to contact the [facility] Attending or Consulting provider 7A - 7P or covering provider during after hours 7P -7A, for this patient?  Check the care team in [facility] and look for a) attending/consulting TRH provider listed and b) the [facility] team listed. Page or secure chat 7A-7P. Log into [email or web address] and use [place]'s universal password to access. If you do not have the password, please contact the hospital operator. Locate the [facility] provider you are looking for under [facility] and page to a number that you can be directly reached. If you still have difficulty reaching the provider, please page the [facility] (Director on Call) for the Hospitalists listed on amion for assistance.

## 2021-12-17 NOTE — Progress Notes (Signed)
   12/17/21 0426  Assess: MEWS Score  BP (!) 148/92  MAP (mmHg) 108  Pulse Rate (!) 152  Resp 16  SpO2 97 %  O2 Device Room Air  Assess: MEWS Score  MEWS Temp 0  MEWS Systolic 0  MEWS Pulse 3  MEWS RR 0  MEWS LOC 0  MEWS Score 3  MEWS Score Color Yellow  Assess: if the MEWS score is Yellow or Red  Were vital signs taken at a resting state? Yes  Focused Assessment No change from prior assessment  Does the patient meet 2 or more of the SIRS criteria? Yes  Does the patient have a confirmed or suspected source of infection? No  MEWS guidelines implemented *See Row Information* Yes  Treat  MEWS Interventions Administered scheduled meds/treatments;Administered prn meds/treatments  Pain Scale 0-10  Pain Score 4  Take Vital Signs  Increase Vital Sign Frequency  Yellow: Q 2hr X 2 then Q 4hr X 2, if remains yellow, continue Q 4hrs  Escalate  MEWS: Escalate Yellow: discuss with charge nurse/RN and consider discussing with provider and RRT  Notify: Charge Nurse/RN  Name of Charge Nurse/RN Notified Delice Bison  Date Charge Nurse/RN Notified 12/17/21  Time Charge Nurse/RN Notified 8527  Provider Notification  Provider Name/Title Dr Margo Aye  Date Provider Notified 12/17/21  Time Provider Notified 0425  Method of Notification Page  Notification Reason New onset of dysrhythmia  Provider response Evaluate remotely;See new orders  Date of Provider Response 12/17/21  Time of Provider Response [phone]  Document  Patient Outcome Stabilized after interventions  Progress note created (see row info) Yes  Assess: SIRS CRITERIA  SIRS Temperature  0  SIRS Pulse 1  SIRS Respirations  0  SIRS WBC 1  SIRS Score Sum  2

## 2021-12-17 NOTE — Progress Notes (Signed)
Pt back in SR 70s

## 2021-12-17 NOTE — Progress Notes (Signed)
Inpatient Rehab Admissions Coordinator:    I spoke with Pt. To discuss potential CIR admit. Pt. Is interested, states her niece can likely provide 24/7 support. I will reach out to her niece to discuss. Will follow for potential CIR admit pending insurance auth.   Megan Salon, MS, CCC-SLP Rehab Admissions Coordinator  [phone] (celll) [phone] (office)

## 2021-12-17 NOTE — Progress Notes (Signed)
Pt HR sustaining 140s-160s ST then soon converted to afib RVR with rates 130s-170s. Pt asymptomatic. VSS. EKG completed. Dr. Margo Aye notified. Order received for PO atenolol. RN verified order with Dr. Margo Aye. PO atenolol administered per order. Pt HR remained unchanged after 30 minutes. A new order for IV metoprolol received. IV metoprolol administered per order.

## 2021-12-18 DIAGNOSIS — R42 Dizziness and giddiness: Secondary | ICD-10-CM | POA: Diagnosis not present

## 2021-12-18 MED ORDER — LOSARTAN POTASSIUM 50 MG PO TABS
100.0000 mg | ORAL_TABLET | Freq: Every day | ORAL | Status: DC
  Administered 2021-12-19 – 2021-12-20 (×2): 100 mg via ORAL
  Filled 2021-12-18 (×2): qty 2

## 2021-12-18 NOTE — Progress Notes (Signed)
PROGRESS NOTE    MACKENZEE Frey  ESP:[id number] DOB: January 18, 1935 DOA: 12/14/2021 PCP: Blair Heys, MD   Brief Narrative:  HPI: [name] is a 87 y.o. female with medical history significant of hypertension, paroxysmal atrial fibrillation not on anticoagulation, left carotid artery stenosis s/p endarterectomy, diastolic CHF, COPD, and small bowel obstruction who presented with complaints of dizziness and nausea that started this morning at 3 am. She states that she was woken out of her sleep with the symptoms and was unable to get herself out of bed.  Unable to describe dizziness in further detail at this time.  She reported having several episodes of vomiting.  Emesis was noted to be of stomach contents and denies any blood present.  Denies any significant abdominal pain.  She had gone to bed yesterday evening in her normal state of health prior to midnight. She reports remote history of being on Coumadin, but stopped taking the medicine due to her sister previously almost bleeding out while on the medication.  To her knowledge she had not eaten anything out of the norm.  Patient does make note that she fell over someone sometime last week and hit her head, but denied sustaining any significant injury.   In route with EMS patient was noted to have an irregular heart rhythm 120s, blood pressure 180/100, and all other vital signs maintained.      In the emergency department patient was noted to be in atrial fibrillation with blood pressures elevated up to 172/91, and O2 saturations maintained on room air.  Labs significant for WBC 10.8 (appears chronic), potassium 2.8, and glucose 177.  CTA of the head and neck noted no large vessel occlusion, but complex plaque in the left ICA origin, and concern for right cervical ICA fibromuscular dysplasia.  Patient has been given Ativan, Zofran,  potassium chloride 30 mEq IV, and 1 L of normal saline IV fluids.  Assessment & Plan:   Principal Problem:    Vertigo Active Problems:   CVA (cerebral vascular accident) (HCC)   Nausea and vomiting   Paroxysmal atrial fibrillation (HCC)   Hypokalemia   Leukocytosis   Essential hypertension   Anxiety   Carotid artery disease (HCC)   Hyperlipidemia   Acute ischemic stroke (HCC)   CVA /acute cerebellar stroke:  CT angiogram of the head and neck did not note any acute abnormality.  Neurology consulted and MRI of the brain did not reveal any acute signs of a stroke, but neurology felt patient actually had a tiny right cerebellar stroke.  Patient still complains of intermittent nausea.  Echo shows normal ejection fraction, grade 1 diastolic dysfunction.  No other abnormality. CTA head & neck negative for LVO, but complex plaque at the left ICA origin with possible adherent thrombus within the vessel lumen, 60% left ICA stenosis, right cervical ICA fibromuscular dysplasia.  Patient has been started on Eliquis, aspirin has been discontinued.  LDL<70, patient on 20 mg of Crestor.  PT OT recommends CIR. waiting for placement.   Nausea and vomiting/possible BPPV This morning, she did have some intermittent nausea, while working with PT, She did not report any dizziness throughout session, but notes when she first wakes up, she has the feeling that her bed is vertical to the floor and the wall is actually the floor.  Per PT, she does appear to have central vertigo.  Paroxysmal atrial fibrillation Patient appears to be in atrial fibrillation and has been rate controlled with atenolol.  She had  previously been on Coumadin, but stopped taking the medication after her sister had a significant bleeding event while on the medication.  Patient was just on aspirin.  CHA2DS2-VASc score =6-7.  Family was concerned with her being on blood thinners that she is alone and has frequent falls.  However neurology has started the patient on Eliquis after discussing with the family.   Hypokalemia Resolved.   Leukocytosis Mild,  no signs of infection, monitor.   Essential hypertension Diastolic blood pressure slightly elevated, continue midodrine and resume losartan.   Anxiety -Continue Ativan IV and transition back to p.o. once able   Hyperlipidemia Continue Crestor.   Frequent falls Assessment PT OT, CIR recommended.  DVT prophylaxis: Eliquis   Code Status: Full Code  Family Communication: None present at bedside.  Plan of care discussed with patient in length and he/she verbalized understanding and agreed with it.  Status is: Inpatient Remains inpatient appropriate because: Needs safe discharge/pending placement to CIR  Estimated body mass index is 25.71 kg/m as calculated from the following:   Height as of this encounter: 5' 4.5" (1.638 m).   Weight as of this encounter: 69 kg.    Nutritional Assessment: Body mass index is 25.71 kg/m.Marland Kitchen Seen by dietician.  I agree with the assessment and plan as outlined below: Nutrition Status:    Skin Assessment: I have examined the patient's skin and I agree with the wound assessment as performed by the wound care RN as outlined below:    Consultants:  Neurology  Procedures:  None  Antimicrobials:  Anti-infectives (From admission, onward)    None         Subjective:  Patient seen and examined.  Was complaining of some nausea and some dizziness.  Objective: Vitals:   12/17/21 2319 12/18/21 0358 12/18/21 0822 12/18/21 1209  BP: (!) 154/81 (!) 156/72 (!) 166/73 (!) 139/54  Pulse: 65 65 71 68  Resp: 18 18 18 18   Temp:  98.1 F (36.7 C) 98 F (36.7 C) 98.5 F (36.9 C)  TempSrc:   Oral Oral  SpO2: 94% 96% 96% 96%  Weight:      Height:        Intake/Output Summary (Last 24 hours) at 12/18/2021 1209 Last data filed at 12/17/2021 1900 Gross per 24 hour  Intake --  Output 1000 ml  Net -1000 ml    Filed Weights   12/14/21 12/16/21  Weight: 69 kg    Examination:  General exam: Appears calm and comfortable  Respiratory system:  Clear to auscultation. Respiratory effort normal. Cardiovascular system: S1 & S2 heard, irregularly irregular RR. No JVD, murmurs, rubs, gallops or clicks. No pedal edema. Gastrointestinal system: Abdomen is nondistended, soft and nontender. No organomegaly or masses felt. Normal bowel sounds heard. Central nervous system: Alert and oriented. No focal neurological deficits. Extremities: Symmetric 5 x 5 power. Skin: No rashes, lesions or ulcers.   Data Reviewed: I have personally reviewed following labs and imaging studies  CBC: Recent Labs  Lab 12/14/21 0629 12/14/21 0745 12/16/21 0342  WBC 10.8*  --  13.9*  NEUTROABS 8.4*  --  11.3*  HGB 14.7 14.3 13.4  HCT 43.7 42.0 41.2  MCV 84.5  --  85.5  PLT 301  --  251    Basic Metabolic Panel: Recent Labs  Lab 12/14/21 0629 12/14/21 0745 12/14/21 1347 12/15/21 0743 12/16/21 0342 12/17/21 0339  NA 141 142  --  137 138 136  K 2.8* 2.6*  --  3.7 3.9  3.6  CL 102 103  --  99 99 99  CO2 23  --   --  23 28 25   GLUCOSE 177* 177*  --  107* 105* 121*  BUN 12 12  --  16 18 13   CREATININE 0.95 0.90  --  1.37* 1.23* 0.91  CALCIUM 9.5  --   --  9.0 9.3 8.9  MG  --   --  1.7  --  1.9  --     GFR: Estimated Creatinine Clearance: 43.6 mL/min (by C-G formula based on SCr of 0.91 mg/dL). Liver Function Tests: Recent Labs  Lab 12/14/21 0629  AST 28  ALT 16  ALKPHOS 90  BILITOT 0.6  PROT 8.2*  ALBUMIN 4.2    Recent Labs  Lab 12/14/21 0629  LIPASE 28    No results for input(s): "AMMONIA" in the last 168 hours. Coagulation Profile: Recent Labs  Lab 12/14/21 0629  INR 1.1    Cardiac Enzymes: No results for input(s): "CKTOTAL", "CKMB", "CKMBINDEX", "TROPONINI" in the last 168 hours. BNP (last 3 results) No results for input(s): "PROBNP" in the last 8760 hours. HbA1C: No results for input(s): "HGBA1C" in the last 72 hours.  CBG: No results for input(s): "GLUCAP" in the last 168 hours. Lipid Profile: No results for  input(s): "CHOL", "HDL", "LDLCALC", "TRIG", "CHOLHDL", "LDLDIRECT" in the last 72 hours.  Thyroid Function Tests: No results for input(s): "TSH", "T4TOTAL", "FREET4", "T3FREE", "THYROIDAB" in the last 72 hours. Anemia Panel: No results for input(s): "VITAMINB12", "FOLATE", "FERRITIN", "TIBC", "IRON", "RETICCTPCT" in the last 72 hours. Sepsis Labs: No results for input(s): "PROCALCITON", "LATICACIDVEN" in the last 168 hours.  No results found for this or any previous visit (from the past 240 hour(s)).   Radiology Studies: No results found.  Scheduled Meds:  amLODipine  10 mg Oral Daily   apixaban  5 mg Oral BID   atenolol  12.5 mg Oral Q breakfast   lidocaine  1 patch Transdermal QHS   oxybutynin  5 mg Oral Daily   pantoprazole  40 mg Oral Daily   rosuvastatin  20 mg Oral Daily   sertraline  100 mg Oral QHS   Continuous Infusions:     LOS: 2 days   12/16/21, MD [facility]  12/18/2021, 12:09 PM   *Please note that this is a verbal dictation therefore any spelling or grammatical errors are due to the "[employer]agon [employer] One" system interpretation.  Please page via Amion and do not message via secure chat for urgent patient care matters. Secure chat can be used for non urgent patient care matters.  How to contact the [facility] Attending or Consulting provider 7A - 7P or covering provider during after hours 7P -7A, for this patient?  Check the care team in [facility] and look for a) attending/consulting [facility] provider listed and b) the [facility] team listed. Page or secure chat 7A-7P. Log into [email or web address] and use [employer]'s universal password to access. If you do not have the password, please contact the hospital operator. Locate the [facility] provider you are looking for under [facility] and page to a number that you can be directly reached. If you still have difficulty reaching the provider, please page the [facility] (Director on Call) for the Hospitalists listed on amion for  assistance.

## 2021-12-18 NOTE — Progress Notes (Signed)
Physical Therapy Treatment Patient Details Name: [name] MRN: [id number] DOB: 01-26-1934 Today's Date: 12/18/2021   History of Present Illness [name] is a 87 y.o. female with medical history significant of hypertension, paroxysmal atrial fibrillation not on anticoagulation, left carotid artery stenosis s/p endarterectomy, diastolic CHF, COPD, and small bowel obstruction who presented with complaints of dizziness and nausea and found to have small cerebellar CVA.    PT Comments    Patient up in chair on arrival. Reporting nausea (and even increasing nausea) throughout session, however most limiting was bil knee and ankle pain. She did not report any dizziness throughout session, but notes when she first wakes up, she has the feeling that her bed is vertical to the floor and the wall is actually the floor. Patient assisted on/off BSC and required incr time to have BM (unskilled time not billable). She was too painful, weak, and shaking to safely attempt ambulation with +1 assist after straining for so long on the [facility]. She did pick up her feet and take better steps on the way back to her chair. Educated in AROM exercises and completed for knees and ankles. Legs left down to allow her to move knees and ankles throughout the day to try to decr pain and stiffness. (Patient reports she did not get OOB at all yesterday).     Recommendations for follow up therapy are one component of a multi-disciplinary discharge planning process, led by the attending physician.  Recommendations may be updated based on patient status, additional functional criteria and insurance authorization.  Follow Up Recommendations  Acute inpatient rehab (3hours/day) Can patient physically be transported by private vehicle: No   Assistance Recommended at Discharge Frequent or constant Supervision/Assistance  Patient can return home with the following Assistance with cooking/housework;Assist for transportation;Assistance  with feeding;Help with stairs or ramp for entrance;Direct supervision/assist for financial management;A lot of help with walking and/or transfers   Equipment Recommendations  Rolling walker (2 wheels)    Recommendations for Other Services       Precautions / Restrictions Precautions Precautions: Fall Precaution Comments: dizziness Restrictions Weight Bearing Restrictions: No     Mobility  Bed Mobility               General bed mobility comments: up in recliner    Transfers Overall transfer level: Needs assistance Equipment used: Rolling walker (2 wheels) Transfers: Sit to/from Stand, Bed to chair/wheelchair/BSC Sit to Stand: Mod assist Stand pivot transfers: Mod assist         General transfer comment: Pt reporting she could not walk due to pain and shakiness (pt visibly trembling in standing). Assisted to [facility] with pt with very small steps and at times shimmying her feet in order to pivot and get close enought to [facility] to sit down; transfer BSC to chair with better ability to step and clear her feet, but still limited by bil knee and ankle pain    Ambulation/Gait               General Gait Details: pt unsafe to ambulate with +1 assist due to bil LE pain and trembling/shaking   Stairs             Wheelchair Mobility    Modified Rankin (Stroke Patients Only) Modified Rankin (Stroke Patients Only) Pre-Morbid Rankin Score: No significant disability Modified Rankin: Moderately severe disability     Balance Overall balance assessment: Needs assistance Sitting-balance support: Feet supported Sitting balance-Leahy Scale: Fair Sitting balance -  Comments: on EOB with S for balance, no LOB   Standing balance support: Bilateral upper extremity supported Standing balance-Leahy Scale: Poor Standing balance comment: UE support and min A for balance                            Cognition Arousal/Alertness: Awake/alert Behavior During Therapy:  Anxious Overall Cognitive Status: Within Functional Limits for tasks assessed                                          Exercises General Exercises - Lower Extremity Ankle Circles/Pumps: AROM, Both, 20 reps Long Arc Quad: AROM, Both, 10 reps, Seated    General Comments General comments (skin integrity, edema, etc.): Pt most limited by poor balance, dizzines, general weakness and anxiety. Pt states she falls often at home.      Pertinent Vitals/Pain Pain Assessment Pain Assessment: Faces Faces Pain Scale: Hurts whole lot Pain Location: knees, ankles, feet Pain Descriptors / Indicators: Aching, Sore Pain Intervention(s): Limited activity within patient's tolerance, Monitored during session, Repositioned    Home Living                          Prior Function            PT Goals (current goals can now be found in the care plan section) Acute Rehab PT Goals Patient Stated Goal: to return to independent Time For Goal Achievement: 12/29/21 Potential to Achieve Goals: Good Progress towards PT goals: Not progressing toward goals - comment (legs painful and very nauseated)    Frequency    Min 3X/week      PT Plan Current plan remains appropriate    Co-evaluation              AM-PAC PT "6 Clicks" Mobility   Outcome Measure  Help needed turning from your back to your side while in a flat bed without using bedrails?: A Lot Help needed moving from lying on your back to sitting on the side of a flat bed without using bedrails?: A Lot Help needed moving to and from a bed to a chair (including a wheelchair)?: A Lot Help needed standing up from a chair using your arms (e.g., wheelchair or bedside chair)?: A Lot Help needed to walk in hospital room?: Total Help needed climbing 3-5 steps with a railing? : Total 6 Click Score: 10    End of Session Equipment Utilized During Treatment: Gait belt Activity Tolerance: Treatment limited secondary to  medical complications (Comment);Patient limited by pain (vertigo and nausea) Patient left: with call bell/phone within reach;in chair;with chair alarm set Nurse Communication: Mobility status PT Visit Diagnosis: Other abnormalities of gait and mobility (R26.89);Dizziness and giddiness (R42);Muscle weakness (generalized) (M62.81);Other symptoms and signs involving the nervous system (R29.898)     Time: 9833-8250 PT Time Calculation (min) (ACUTE ONLY): 61 min  Charges:  $Therapeutic Exercise: 8-22 mins $Therapeutic Activity: 8-22 mins                      Jerolyn Center, PT Acute Rehabilitation Services  Office [phone]    Zena Amos 12/18/2021, 10:40 AM

## 2021-12-18 NOTE — Progress Notes (Signed)
Inpatient Rehab Admissions Coordinator:   I am following for potential CIR admit. Opened case with insurance today and called Pt.'s niece (left VM) to discuss support after discharge.   Megan Salon, MS, CCC-SLP Rehab Admissions Coordinator  [phone] (celll) [phone] (office)

## 2021-12-18 NOTE — Progress Notes (Signed)
Occupational Therapy Treatment Patient Details Name: [name] MRN: [id number] DOB: Jan 20, 1935 Today's Date: 12/18/2021   History of present illness [name] is a 87 y.o. female with medical history significant of hypertension, paroxysmal atrial fibrillation not on anticoagulation, left carotid artery stenosis s/p endarterectomy, diastolic CHF, COPD, and small bowel obstruction who presented with complaints of dizziness and nausea and found to have small cerebellar CVA.   OT comments  Pt making slow progress. Pt reports being in bed and not mobile most of the weekend therefore may have some B knee pain that presented this am.  ROM completed to knees before getting to EOB and after marching in place at EOB, knees appeared to improve. Pt continues to be dizzy when mobilizing. Reviewed gaze stabilization techniques and got OOB with overall mod assist to transfer and ambulate. Pt requesting to sit due to weakness and dizziness. Feel pt is an excellent rehab candidate as she lived alone PTA and has a niece that can assist after d/c. Feel AIR would allow this pt to achieve prior level of functioning in efficient manner. Will continue to see with focus on adls OOB and gaze stabilization tasks.   Recommendations for follow up therapy are one component of a multi-disciplinary discharge planning process, led by the attending physician.  Recommendations may be updated based on patient status, additional functional criteria and insurance authorization.    Follow Up Recommendations  Acute inpatient rehab (3hours/day)     Assistance Recommended at Discharge Frequent or constant Supervision/Assistance  Patient can return home with the following  A little help with walking and/or transfers;A lot of help with bathing/dressing/bathroom;Assistance with cooking/housework;Assist for transportation;Help with stairs or ramp for entrance   Equipment Recommendations  BSC/3in1;Tub/shower seat    Recommendations  for Other Services      Precautions / Restrictions Precautions Precautions: Fall Precaution Comments: dizziness Restrictions Weight Bearing Restrictions: No       Mobility Bed Mobility Overal bed mobility: Needs Assistance Bed Mobility: Supine to Sit     Supine to sit: Mod assist, HOB elevated     General bed mobility comments: cues to get into full sit. Pt did move to EOB with cues to reduce anxiety.    Transfers Overall transfer level: Needs assistance Equipment used: Rolling walker (2 wheels) Transfers: Sit to/from Stand, Bed to chair/wheelchair/BSC Sit to Stand: Mod assist Stand pivot transfers: Mod assist         General transfer comment: Cues to look at visual target when moving. Pt states dizziness did improve when doing this.  Pt with small shuffled steps due to knee pain.   Worked on walking in place and lifting knees due to reported knee pain. Pain appeared to subside with more movement.  Feel pt needs to mobilize more and be out of bed more.     Balance Overall balance assessment: Needs assistance Sitting-balance support: Feet supported Sitting balance-Leahy Scale: Fair Sitting balance - Comments: on EOB with S for balance, no LOB   Standing balance support: Bilateral upper extremity supported Standing balance-Leahy Scale: Poor Standing balance comment: UE support and min A for balance                           ADL either performed or assessed with clinical judgement   ADL Overall ADL's : Needs assistance/impaired Eating/Feeding: Set up;Sitting   Grooming: Wash/dry hands;Wash/dry face;Oral care;Moderate assistance;Standing Grooming Details (indicate cue type and reason): Pt stood at  sink but was very tremulous with all movement and was betting holding to sink with one hand. Pt did not tolerate standing long before asking to sit.             Lower Body Dressing: Moderate assistance;Sit to/from stand Lower Body Dressing Details (indicate  cue type and reason): Pt required mod assist to start pants over feet and mod assist to stand. Pt has not been up most of weekend therefore unable to let go of walker to pull pants up without mod assist.  Pt with knee pain after being in bed all day yesterday. Deferred donning socks due to knee pain. Toilet Transfer: Moderate assistance;Rolling walker (2 wheels);Stand-pivot;BSC/3in1 Statistician Details (indicate cue type and reason): Pt weak from being in bed over weekend and declined walking to bathroom. Pt transferred to [facility] with min assist. Pt with small steps and wanting to sit. Toileting- Clothing Manipulation and Hygiene: Minimal assistance;Sit to/from stand Toileting - Clothing Manipulation Details (indicate cue type and reason): mod assist for hygiene in standing due to not being able to let go of walker due to unsteadiness.     Functional mobility during ADLs: Moderate assistance General ADL Comments: Pt limited with adls due to poor balance, anxiety and weakness.    Extremity/Trunk Assessment Upper Extremity Assessment Upper Extremity Assessment: Overall WFL for tasks assessed   Lower Extremity Assessment Lower Extremity Assessment: Defer to PT evaluation        Vision   Vision Assessment?: Yes Eye Alignment: Within Functional Limits Ocular Range of Motion: Within Functional Limits Alignment/Gaze Preference: Within Defined Limits Tracking/Visual Pursuits: Able to track stimulus in all quads without difficulty Convergence: Within functional limits Visual Fields: No apparent deficits Additional Comments: Pt states when she wakes up in morning she feels like the bed is in a vertical position and what she sees on the wall in front of her is actually the floor. It has happened a few times but clears quickly.   Perception Perception Perception: Impaired   Praxis Praxis Praxis: Intact    Cognition Arousal/Alertness: Awake/alert Behavior During Therapy: Anxious Overall  Cognitive Status: Within Functional Limits for tasks assessed                                          Exercises      Shoulder Instructions       General Comments Pt most limited by poor balance, dizzines, general weakness and anxiety. Pt states she falls often at home.    Pertinent Vitals/ Pain       Pain Assessment Pain Assessment: Faces Faces Pain Scale: Hurts little more Pain Location: knees Pain Descriptors / Indicators: Aching, Sore Pain Intervention(s): Monitored during session, Repositioned  Home Living                                          Prior Functioning/Environment              Frequency  Min 2X/week        Progress Toward Goals  OT Goals(current goals can now be found in the care plan section)  Progress towards OT goals: Progressing toward goals  Acute Rehab OT Goals Patient Stated Goal: to not feel so cruddy OT Goal Formulation: With patient Time For Goal Achievement: 12/30/21  Potential to Achieve Goals: Good ADL Goals Pt Will Perform Grooming: standing;with supervision Pt Will Perform Lower Body Dressing: sit to/from stand;with supervision Pt Will Transfer to Toilet: with supervision;bedside commode;ambulating Additional ADL Goal #1: Patient will independently recall and demo gaze stabilization techniques in prep for ADLs/ADL transfers.  Plan Discharge plan remains appropriate    Co-evaluation                 AM-PAC OT "6 Clicks" Daily Activity     Outcome Measure   Help from another person eating meals?: None Help from another person taking care of personal grooming?: A Little Help from another person toileting, which includes using toliet, bedpan, or urinal?: A Lot Help from another person bathing (including washing, rinsing, drying)?: A Lot Help from another person to put on and taking off regular upper body clothing?: A Little Help from another person to put on and taking off regular  lower body clothing?: A Lot 6 Click Score: 16    End of Session Equipment Utilized During Treatment: Gait belt;Rolling walker (2 wheels)  OT Visit Diagnosis: Unsteadiness on feet (R26.81);Muscle weakness (generalized) (M62.81);Dizziness and giddiness (R42)   Activity Tolerance Patient tolerated treatment well   Patient Left in chair;with call bell/phone within reach;with chair alarm set   Nurse Communication Mobility status        Time: 2751-7001 OT Time Calculation (min): 26 min  Charges: OT General Charges $OT Visit: 1 Visit OT Treatments $Self Care/Home Management : 23-37 mins   Hope Budds 12/18/2021, 8:55 AM

## 2021-12-19 DIAGNOSIS — R42 Dizziness and giddiness: Secondary | ICD-10-CM | POA: Diagnosis not present

## 2021-12-19 LAB — CBC WITH DIFFERENTIAL/PLATELET
Abs Immature Granulocytes: 0.03 10*3/uL (ref 0.00–0.07)
Basophils Absolute: 0 10*3/uL (ref 0.0–0.1)
Basophils Relative: 0 %
Eosinophils Absolute: 0.1 10*3/uL (ref 0.0–0.5)
Eosinophils Relative: 1 %
HCT: 40.6 % (ref 36.0–46.0)
Hemoglobin: 13.3 g/dL (ref 12.0–15.0)
Immature Granulocytes: 0 %
Lymphocytes Relative: 10 %
Lymphs Abs: 1.1 10*3/uL (ref 0.7–4.0)
MCH: 27.7 pg (ref 26.0–34.0)
MCHC: 32.8 g/dL (ref 30.0–36.0)
MCV: 84.6 fL (ref 80.0–100.0)
Monocytes Absolute: 1 10*3/uL (ref 0.1–1.0)
Monocytes Relative: 9 %
Neutro Abs: 8.7 10*3/uL — ABNORMAL HIGH (ref 1.7–7.7)
Neutrophils Relative %: 80 %
Platelets: 262 10*3/uL (ref 150–400)
RBC: 4.8 MIL/uL (ref 3.87–5.11)
RDW: 13.9 % (ref 11.5–15.5)
WBC: 10.8 10*3/uL — ABNORMAL HIGH (ref 4.0–10.5)
nRBC: 0 % (ref 0.0–0.2)

## 2021-12-19 LAB — BASIC METABOLIC PANEL
Anion gap: 12 (ref 5–15)
BUN: 17 mg/dL (ref 8–23)
CO2: 24 mmol/L (ref 22–32)
Calcium: 8.8 mg/dL — ABNORMAL LOW (ref 8.9–10.3)
Chloride: 97 mmol/L — ABNORMAL LOW (ref 98–111)
Creatinine, Ser: 0.91 mg/dL (ref 0.44–1.00)
GFR, Estimated: >60 mL/min (ref >60)
Glucose, Bld: 100 mg/dL — ABNORMAL HIGH (ref 70–99)
Potassium: 3.2 mmol/L — ABNORMAL LOW (ref 3.5–5.1)
Sodium: 133 mmol/L — ABNORMAL LOW (ref 135–145)

## 2021-12-19 MED ORDER — POTASSIUM CHLORIDE CRYS ER 20 MEQ PO TBCR
40.0000 meq | EXTENDED_RELEASE_TABLET | ORAL | Status: AC
  Administered 2021-12-19 (×2): 40 meq via ORAL
  Filled 2021-12-19 (×2): qty 2

## 2021-12-19 NOTE — Progress Notes (Signed)
PROGRESS NOTE    Lori [name]  ZMO:[id number] DOB: 1934-11-16 DOA: 12/14/2021 PCP: Blair Heys, MD   Brief Narrative:  HPI: Lori [name] is a 87 y.o. female with medical history significant of hypertension, paroxysmal atrial fibrillation not on anticoagulation, left carotid artery stenosis s/p endarterectomy, diastolic CHF, COPD, and small bowel obstruction who presented with complaints of dizziness and nausea that started this morning at 3 am. She states that she was woken out of her sleep with the symptoms and was unable to get herself out of bed.  Unable to describe dizziness in further detail at this time.  She reported having several episodes of vomiting.  Emesis was noted to be of stomach contents and denies any blood present.  Denies any significant abdominal pain.  She had gone to bed yesterday evening in her normal state of health prior to midnight. She reports remote history of being on Coumadin, but stopped taking the medicine due to her sister previously almost bleeding out while on the medication.  To her knowledge she had not eaten anything out of the norm.  Patient does make note that she fell over someone sometime last week and hit her head, but denied sustaining any significant injury.   In route with EMS patient was noted to have an irregular heart rhythm 120s, blood pressure 180/100, and all other vital signs maintained.      In the emergency department patient was noted to be in atrial fibrillation with blood pressures elevated up to 172/91, and O2 saturations maintained on room air.  Labs significant for WBC 10.8 (appears chronic), potassium 2.8, and glucose 177.  CTA of the head and neck noted no large vessel occlusion, but complex plaque in the left ICA origin, and concern for right cervical ICA fibromuscular dysplasia.  Patient has been given Ativan, Zofran,  potassium chloride 30 mEq IV, and 1 L of normal saline IV fluids.  Assessment & Plan:   Principal Problem:    Vertigo Active Problems:   CVA (cerebral vascular accident) (HCC)   Nausea and vomiting   Paroxysmal atrial fibrillation (HCC)   Hypokalemia   Leukocytosis   Essential hypertension   Anxiety   Carotid artery disease (HCC)   Hyperlipidemia   Acute ischemic stroke (HCC)   CVA /acute cerebellar stroke:  CT angiogram of the head and neck did not note any acute abnormality.  Neurology consulted and MRI of the brain did not reveal any acute signs of a stroke, but neurology felt patient actually had a tiny right cerebellar stroke.  Patient still complains of intermittent nausea.  Echo shows normal ejection fraction, grade 1 diastolic dysfunction.  No other abnormality. CTA head & neck negative for LVO, but complex plaque at the left ICA origin with possible adherent thrombus within the vessel lumen, 60% left ICA stenosis, right cervical ICA fibromuscular dysplasia.  Patient has been started on Eliquis, aspirin has been discontinued.  LDL<70, patient on 20 mg of Crestor.  PT OT recommends CIR. waiting for placement.   Nausea and vomiting/possible BPPV Has intermittent nausea and dizziness but today she is feeling better.  PT following.  Paroxysmal atrial fibrillation Patient appears to be in atrial fibrillation and has been rate controlled with atenolol.  She had previously been on Coumadin, but stopped taking the medication after her sister had a significant bleeding event while on the medication.  Patient was just on aspirin.  CHA2DS2-VASc score =6-7.  Family was concerned with her being on blood thinners that  she is alone and has frequent falls.  However neurology has started the patient on Eliquis after discussing with the family.   Hypokalemia Resolved.   Leukocytosis Mild, no signs of infection, monitor.   Essential hypertension Fairly controlled, continue amlodipine, atenolol and losartan.   Anxiety Continue Zoloft.   Hyperlipidemia Continue Crestor.   Frequent falls Assessment  PT OT, CIR recommended.  DVT prophylaxis: Eliquis   Code Status: Full Code  Family Communication: None present at bedside.  Plan of care discussed with patient in length and he/she verbalized understanding and agreed with it.  Status is: Inpatient Remains inpatient appropriate because: Medically stable, pending CIR placement.  Estimated body mass index is 25.71 kg/m as calculated from the following:   Height as of this encounter: 5' 4.5" (1.638 m).   Weight as of this encounter: 69 kg.    Nutritional Assessment: Body mass index is 25.71 kg/m.Marland Kitchen Seen by dietician.  I agree with the assessment and plan as outlined below: Nutrition Status:    Skin Assessment: I have examined the patient's skin and I agree with the wound assessment as performed by the wound care RN as outlined below:    Consultants:  Neurology  Procedures:  None  Antimicrobials:  Anti-infectives (From admission, onward)    None         Subjective:  Seen and examined.  No new complaint today.  Overall improved.  Objective: Vitals:   12/18/21 1956 12/18/21 2343 12/19/21 0349 12/19/21 0811  BP: (!) 127/57 (!) 131/52 (!) 143/58 (!) 149/52  Pulse: 64 (!) 57 (!) 58 60  Resp: 16 14 16 16   Temp: 98.9 F (37.2 C) 98 F (36.7 C) 98.3 F (36.8 C) 98.6 F (37 C)  TempSrc: Oral Oral  Oral  SpO2: 93% 94% 94% 96%  Weight:      Height:        Intake/Output Summary (Last 24 hours) at 12/19/2021 1126 Last data filed at 12/18/2021 1632 Gross per 24 hour  Intake 360 ml  Output 250 ml  Net 110 ml    Filed Weights   12/14/21 12/16/21  Weight: 69 kg    Examination:  General exam: Appears calm and comfortable  Respiratory system: Clear to auscultation. Respiratory effort normal. Cardiovascular system: S1 & S2 heard, RRR. No JVD, murmurs, rubs, gallops or clicks. No pedal edema. Gastrointestinal system: Abdomen is nondistended, soft and nontender. No organomegaly or masses felt. Normal bowel sounds  heard. Central nervous system: Alert and oriented. No focal neurological deficits. Extremities: Symmetric 5 x 5 power. Skin: No rashes, lesions or ulcers.  Psychiatry: Judgement and insight appear normal. Mood & affect appropriate.    Data Reviewed: I have personally reviewed following labs and imaging studies  CBC: Recent Labs  Lab 12/14/21 0629 12/14/21 0745 12/16/21 0342 12/19/21 0819  WBC 10.8*  --  13.9* 10.8*  NEUTROABS 8.4*  --  11.3* 8.7*  HGB 14.7 14.3 13.4 13.3  HCT 43.7 42.0 41.2 40.6  MCV 84.5  --  85.5 84.6  PLT 301  --  251 262    Basic Metabolic Panel: Recent Labs  Lab 12/14/21 0629 12/14/21 0745 12/14/21 1347 12/15/21 0743 12/16/21 0342 12/17/21 0339 12/19/21 0819  NA 141 142  --  137 138 136 133*  K 2.8* 2.6*  --  3.7 3.9 3.6 3.2*  CL 102 103  --  99 99 99 97*  CO2 23  --   --  23 28 25 24   GLUCOSE 177*  177*  --  107* 105* 121* 100*  BUN 12 12  --  16 18 13 17   CREATININE 0.95 0.90  --  1.37* 1.23* 0.91 0.91  CALCIUM 9.5  --   --  9.0 9.3 8.9 8.8*  MG  --   --  1.7  --  1.9  --   --     GFR: Estimated Creatinine Clearance: 43.6 mL/min (by C-G formula based on SCr of 0.91 mg/dL). Liver Function Tests: Recent Labs  Lab 12/14/21 0629  AST 28  ALT 16  ALKPHOS 90  BILITOT 0.6  PROT 8.2*  ALBUMIN 4.2    Recent Labs  Lab 12/14/21 0629  LIPASE 28    No results for input(s): "AMMONIA" in the last 168 hours. Coagulation Profile: Recent Labs  Lab 12/14/21 0629  INR 1.1    Cardiac Enzymes: No results for input(s): "CKTOTAL", "CKMB", "CKMBINDEX", "TROPONINI" in the last 168 hours. BNP (last 3 results) No results for input(s): "PROBNP" in the last 8760 hours. HbA1C: No results for input(s): "HGBA1C" in the last 72 hours.  CBG: No results for input(s): "GLUCAP" in the last 168 hours. Lipid Profile: No results for input(s): "CHOL", "HDL", "LDLCALC", "TRIG", "CHOLHDL", "LDLDIRECT" in the last 72 hours.  Thyroid Function Tests: No  results for input(s): "TSH", "T4TOTAL", "FREET4", "T3FREE", "THYROIDAB" in the last 72 hours. Anemia Panel: No results for input(s): "VITAMINB12", "FOLATE", "FERRITIN", "TIBC", "IRON", "RETICCTPCT" in the last 72 hours. Sepsis Labs: No results for input(s): "PROCALCITON", "LATICACIDVEN" in the last 168 hours.  No results found for this or any previous visit (from the past 240 hour(s)).   Radiology Studies: No results found.  Scheduled Meds:  amLODipine  10 mg Oral Daily   apixaban  5 mg Oral BID   atenolol  12.5 mg Oral Q breakfast   lidocaine  1 patch Transdermal QHS   losartan  100 mg Oral Q breakfast   oxybutynin  5 mg Oral Daily   pantoprazole  40 mg Oral Daily   rosuvastatin  20 mg Oral Daily   sertraline  100 mg Oral QHS   Continuous Infusions:     LOS: 3 days   12/16/21, MD [facility]  12/19/2021, 11:26 AM   *Please note that this is a verbal dictation therefore any spelling or grammatical errors are due to the "[employer] One" system interpretation.  Please page via Amion and do not message via secure chat for urgent patient care matters. Secure chat can be used for non urgent patient care matters.  How to contact the [facility] Attending or Consulting provider 7A - 7P or covering provider during after hours 7P -7A, for this patient?  Check the care team in [facility] and look for a) attending/consulting [facility] provider listed and b) the [facility] team listed. Page or secure chat 7A-7P. Log into [email or web address] and use [place]'s universal password to access. If you do not have the password, please contact the hospital operator. Locate the [facility] provider you are looking for under [facility] and page to a number that you can be directly reached. If you still have difficulty reaching the provider, please page the [facility] (Director on Call) for the Hospitalists listed on amion for assistance.

## 2021-12-19 NOTE — PMR Pre-admission (Signed)
PMR Admission Coordinator Pre-Admission Assessment  Patient: [name] is an 87 y.o., female MRN: [id number] DOB: 01/09/1935 Height: 5' 4.5" (163.8 cm) Weight: 69 kg  Insurance Information HMO: yes    PPO:      PCP:      IPA:      80/20:      OTHER:  PRIMARY: [employer]       Policy#: [id number]      Subscriber: Pt.  CM Name: Utilization Management      Phone#: [phone]     Fax#: [phone] Pre-Cert#: [id number]  (on auth letter: Inez Pilgrim)    Employer: Pt. Approved on 12/19/21  for admit for 5 days from date of admission.  Benefits:  Phone #:      Name:  Dolores Hoose Date: 01/22/2021- still active Deductible: does not have one OOP Max: $3,950 ($0 met) CIR: $325/day copay for days 1-6, $0 copay for days 7-90 SNF: $10/day co-pay for days 1-20, $196/day co-pay for days 21-60; $0/day co-pay days 61-100 Outpatient: $20 copay/visit Home Health:  100% coverage; limited by medical necessity DME: 80% coverage, 20% co-insurance Providers: in [place]    SECONDARY:       Policy#:      Phone#:   Artist:       Phone#:   The Data processing manager" for patients in Inpatient Rehabilitation Facilities with attached "Privacy Act Statement-Health Care Records" was provided and verbally reviewed with: Patient  Emergency Contact Information Contact Information     Name Relation Home Work Mobile   Jones,Cindy Niece [phone]  [phone]   Clark,Sally Niece [phone]         Current Medical History  Patient Admitting Diagnosis: CVA History of Present Illness: [name] is a 87 y.o. female with medical history significant of hypertension, paroxysmal atrial fibrillation not on anticoagulation, left carotid artery stenosis s/p endarterectomy, diastolic CHF, COPD, and small bowel obstruction who presented to the [facility] ED 12/14/21 with complaints of dizziness and nausea. In route with EMS patient was noted to have an irregular heart rhythm 120s, blood  pressure 180/100, and all other vital signs maintained.    In the emergency department patient was noted to be in atrial fibrillation with blood pressures elevated up to 172/91, and O2 saturations maintained on room air.  Labs significant for WBC 10.8 (appears chronic), potassium 2.8, and glucose 177.  CTA of the head and neck noted no large vessel occlusion, but complex plaque in the left ICA origin, and concern for right cervical ICA fibromuscular dysplasia.  Patient has been given Ativan, Zofran,  potassium chloride 30 mEq IV, and 1 L of normal saline IV fluids.  MRI brain shows tiny right cerebellar stroke. Echo shows normal ejection fraction, grade 1 diastolic dysfunction.  No other abnormality. CTA head & neck negative for LVO, but complex plaque at the left ICA origin with possible adherent thrombus within the vessel lumen, 60% left ICA stenosis, right cervical ICA fibromuscular dysplasia.  Patient has been started on Eliquis, aspirin has been discontinued.  LDL<70, patient on 20 mg of Crestor.  Pt. Seen by PT/OT who recommend CIR to assist return to PLOF.    Complete NIHSS TOTAL: 0  Patient's medical record from [facility]  has been reviewed by the rehabilitation admission coordinator and physician.  Past Medical History  Past Medical History:  Diagnosis Date   Arthritis    Atrial fibrillation (HCC)    Carotid artery occlusion  Colitis    COPD (chronic obstructive pulmonary disease) (HCC)    Diverticulitis    Hypertension    Keratosis, seborrheic     Has the patient had major surgery during 100 days prior to admission? No  Family History   family history includes AAA (abdominal aortic aneurysm) in her brother and sister; Breast cancer in her cousin and other family members; Heart attack in her brother; Heart disease in her mother.  Current Medications  Current Facility-Administered Medications:    acetaminophen (TYLENOL) tablet 650 mg, 650 mg, Oral, Q4H PRN, 650  mg at 12/18/21 2103 **OR** acetaminophen (TYLENOL) 160 MG/5ML solution 650 mg, 650 mg, Per Tube, Q4H PRN **OR** acetaminophen (TYLENOL) suppository 650 mg, 650 mg, Rectal, Q4H PRN, Katrinka Blazing, Rondell A, MD   amLODipine (NORVASC) tablet 10 mg, 10 mg, Oral, Daily, Pahwani, Ravi, MD, 10 mg at 12/19/21 0820   apixaban (ELIQUIS) tablet 5 mg, 5 mg, Oral, BID, Pahwani, Ravi, MD, 5 mg at 12/19/21 0820   atenolol (TENORMIN) tablet 12.5 mg, 12.5 mg, Oral, Q breakfast, Hall, Carole N, DO, 12.5 mg at 12/19/21 0820   hydrALAZINE (APRESOLINE) injection 5 mg, 5 mg, Intravenous, Q6H PRN, Hall, Carole N, DO   lidocaine (LIDODERM) 5 % 1 patch, 1 patch, Transdermal, QHS, Hall, Carole N, DO, 1 patch at 12/18/21 2103   losartan (COZAAR) tablet 100 mg, 100 mg, Oral, Q breakfast, Pahwani, Ravi, MD, 100 mg at 12/19/21 0820   metoprolol tartrate (LOPRESSOR) injection 2.5 mg, 2.5 mg, Intravenous, Q6H PRN, Hall, Carole N, DO, 2.5 mg at 12/17/21 0535   oxybutynin (DITROPAN) tablet 5 mg, 5 mg, Oral, Daily, Pahwani, Ravi, MD, 5 mg at 12/19/21 0821   pantoprazole (PROTONIX) EC tablet 40 mg, 40 mg, Oral, Daily, Pahwani, Ravi, MD, 40 mg at 12/19/21 0820   polyethylene glycol (MIRALAX / GLYCOLAX) packet 17 g, 17 g, Oral, Daily PRN, Hall, Carole N, DO   prochlorperazine (COMPAZINE) injection 5 mg, 5 mg, Intravenous, Q6H PRN, Hall, Carole N, DO, 5 mg at 12/15/21 2222   rosuvastatin (CRESTOR) tablet 20 mg, 20 mg, Oral, Daily, Pahwani, Ravi, MD, 20 mg at 12/19/21 0820   senna-docusate (Senokot-S) tablet 1 tablet, 1 tablet, Oral, QHS PRN, Katrinka Blazing, Rondell A, MD   sertraline (ZOLOFT) tablet 100 mg, 100 mg, Oral, QHS, Pahwani, Ravi, MD, 100 mg at 12/18/21 2103  Patients Current Diet:  Diet Order             Diet Heart Room service appropriate? Yes; Fluid consistency: Thin  Diet effective now                   Precautions / Restrictions Precautions Precautions: Fall Precaution Comments: dizziness Restrictions Weight Bearing  Restrictions: No   Has the patient had 2 or more falls or a fall with injury in the past year? Yes  Prior Activity Level Community (5-7x/wk): Pt. active in the community PTA  Prior Functional Level Self Care: Did the patient need help bathing, dressing, using the toilet or eating? Independent  Indoor Mobility: Did the patient need assistance with walking from room to room (with or without device)? Independent  Stairs: Did the patient need assistance with internal or external stairs (with or without device)? Independent  Functional Cognition: Did the patient need help planning regular tasks such as shopping or remembering to take medications? Independent  Patient Information Are you of Hispanic, Latino/a,or Spanish origin?: A. No, not of Hispanic, Latino/a, or Spanish origin What is your race?: A. White Do  you need or want an interpreter to communicate with a doctor or health care staff?: 0. No  Patient's Response To:  Health Literacy and Transportation Is the patient able to respond to health literacy and transportation needs?: Yes Health Literacy - How often do you need to have someone help you when you read instructions, pamphlets, or other written material from your doctor or pharmacy?: Never In the past 12 months, has lack of transportation kept you from medical appointments or from getting medications?: No In the past 12 months, has lack of transportation kept you from meetings, work, or from getting things needed for daily living?: No  Home Assistive Devices / Equipment Home Equipment: Agricultural consultant (2 wheels), [employer] - single point  Prior Device Use: Indicate devices/aids used by the patient prior to current illness, exacerbation or injury?  spc  Current Functional Level Cognition  Overall Cognitive Status: Within Functional Limits for tasks assessed Orientation Level: Oriented X4 General Comments: but not formally assessed, a little distracted by headache pain and  dizziness/nausea    Extremity Assessment (includes Sensation/Coordination)  Upper Extremity Assessment: Overall WFL for tasks assessed  Lower Extremity Assessment: Defer to PT evaluation    ADLs  Overall ADL's : Needs assistance/impaired Eating/Feeding: Set up, Sitting Eating/Feeding Details (indicate cue type and reason): Set-up A to open packages Grooming: Wash/dry hands, Wash/dry face, Oral care, Moderate assistance, Standing Grooming Details (indicate cue type and reason): Pt stood at sink but was very tremulous with all movement and was betting holding to sink with one hand. Pt did not tolerate standing long before asking to sit. Upper Body Bathing: Minimal assistance, Sitting Lower Body Bathing: Moderate assistance, Sit to/from stand Lower Body Bathing Details (indicate cue type and reason): Mod A in sitting/standing secondary to balance deficits and generalized weakness. Upper Body Dressing : Minimal assistance, Sitting Lower Body Dressing: Moderate assistance, Sit to/from stand Lower Body Dressing Details (indicate cue type and reason): Pt required mod assist to start pants over feet and mod assist to stand. Pt has not been up most of weekend therefore unable to let go of walker to pull pants up without mod assist.  Pt with knee pain after being in bed all day yesterday. Deferred donning socks due to knee pain. Toilet Transfer: Moderate assistance, Rolling walker (2 wheels), Stand-pivot, BSC/3in1 Toilet Transfer Details (indicate cue type and reason): Pt weak from being in bed over weekend and declined walking to bathroom. Pt transferred to [facility] with min assist. Pt with small steps and wanting to sit. Toileting- Clothing Manipulation and Hygiene: Minimal assistance, Sit to/from stand Toileting - Clothing Manipulation Details (indicate cue type and reason): mod assist for hygiene in standing due to not being able to let go of walker due to unsteadiness. Functional mobility during ADLs:  Moderate assistance General ADL Comments: Pt limited with adls due to poor balance, anxiety and weakness.    Mobility  Overal bed mobility: Needs Assistance Bed Mobility: Supine to Sit Supine to sit: Mod assist, HOB elevated General bed mobility comments: up in recliner    Transfers  Overall transfer level: Needs assistance Equipment used: Rolling walker (2 wheels) Transfers: Sit to/from Stand, Bed to chair/wheelchair/BSC Sit to Stand: Mod assist Bed to/from chair/wheelchair/BSC transfer type:: Step pivot Stand pivot transfers: Mod assist General transfer comment: Pt reporting she could not walk due to pain and shakiness (pt visibly trembling in standing). Assisted to [facility] with pt with very small steps and at times shimmying her feet in order to  pivot and get close enought to [facility] to sit down; transfer BSC to chair with better ability to step and clear her feet, but still limited by bil knee and ankle pain    Ambulation / Gait / Stairs / Wheelchair Mobility  Ambulation/Gait Ambulation/Gait assistance: Mod assist Gait Distance (Feet): 25 Feet Assistive device: Rolling walker (2 wheels) Gait Pattern/deviations: Decreased stride length, Trunk flexed, Wide base of support, Step-to pattern General Gait Details: pt unsafe to ambulate with +1 assist due to bil LE pain and trembling/shaking Gait velocity: significantly decrease Gait velocity interpretation: <1.31 ft/sec, indicative of household ambulator    Posture / Balance Dynamic Sitting Balance Sitting balance - Comments: on EOB with S for balance, no LOB Balance Overall balance assessment: Needs assistance Sitting-balance support: Feet supported Sitting balance-Leahy Scale: Fair Sitting balance - Comments: on EOB with S for balance, no LOB Standing balance support: Bilateral upper extremity supported Standing balance-Leahy Scale: Poor Standing balance comment: UE support and min A for balance    Special needs/care consideration Skin  intact; Pt. HoH   Previous Home Environment (from acute therapy documentation) Living Arrangements: Alone  Lives With: Alone Available Help at Discharge: Family, Available 24 hours/day (states neice or step daughter could stay) Type of Home: House Home Layout: One level Home Access: Stairs to enter Entrance Stairs-Rails: Left, Right Entrance Stairs-Number of Steps: 3 Bathroom Shower/Tub: Health visitor: Standard Bathroom Accessibility: Yes How Accessible: Accessible via wheelchair, Accessible via walker  Discharge Living Setting Plans for Discharge Living Setting: Patient's home Type of Home at Discharge: House Discharge Home Layout: One level Discharge Home Access: Stairs to enter Entrance Stairs-Rails: Left, Right Entrance Stairs-Number of Steps: 3 Discharge Bathroom Shower/Tub: Walk-in shower Discharge Bathroom Toilet: Standard Discharge Bathroom Accessibility: No  Social/Family/Support Systems Patient Roles: Other (Comment) Contact Information: [phone] Anticipated Caregiver: Arline Asp Jones(neice and other family to rotate) Anticipated Caregiver's Contact Information: [phone] Ability/Limitations of Caregiver: Min A Caregiver Availability: 24/7 Discharge Plan Discussed with Primary Caregiver: Yes Is Caregiver In Agreement with Plan?: Yes Does Caregiver/Family have Issues with Lodging/Transportation while Pt is in Rehab?: No  Goals Patient/Family Goal for Rehab: PT/OT Supervision Expected length of stay: 12-14 days Pt/Family Agrees to Admission and willing to participate: Yes Program Orientation Provided & Reviewed with Pt/Caregiver Including Roles  & Responsibilities: Yes  Decrease burden of Care through IP rehab admission: none   Possible need for SNF placement upon discharge: not anticipated   Patient Condition: I have reviewed medical records from [facility] , spoken with CM, and patient. I met with patient at the bedside  and discussed via phone for inpatient rehabilitation assessment.  Patient will benefit from ongoing PT and OT, can actively participate in 3 hours of therapy a day 5 days of the week, and can make measurable gains during the admission.  Patient will also benefit from the coordinated team approach during an Inpatient Acute Rehabilitation admission.  The patient will receive intensive therapy as well as Rehabilitation physician, nursing, social worker, and care management interventions.  Due to safety, skin/wound care, disease management, medication administration, pain management, and patient education the patient requires 24 hour a day rehabilitation nursing.  The patient is currently Mod A with mobility and basic ADLs.  Discharge setting and therapy post discharge at home with home health is anticipated.  Patient has agreed to participate in the Acute Inpatient Rehabilitation Program and will admit {Time; today/tomorrow:10263}.  Preadmission Screen Completed By:  Jeronimo Greaves, 12/19/2021 8:22 AM  ______________________________________________________________________   Discussed status with Dr. Marland Kitchen on *** at *** and received approval for admission today.  Admission Coordinator:  Jeronimo Greaves, CCC-SLP, time Marland KitchenDorna Bloom ***   Assessment/Plan: Diagnosis: Does the need for close, 24 hr/day Medical supervision in concert with the patient's rehab needs make it unreasonable for this patient to be served in a less intensive setting? {yes_no_potentially:3041433} Co-Morbidities requiring supervision/potential complications: *** Due to {due ZO:1096045}, does the patient require 24 hr/day rehab nursing? {yes_no_potentially:3041433} Does the patient require coordinated care of a physician, rehab nurse, PT, OT, and SLP to address physical and functional deficits in the context of the above medical diagnosis(es)? {yes_no_potentially:3041433} Addressing deficits in the following areas: {deficits:3041436} Can the  patient actively participate in an intensive therapy program of at least 3 hrs of therapy 5 days a week? {yes_no_potentially:3041433} The potential for patient to make measurable gains while on inpatient rehab is {potential:3041437} Anticipated functional outcomes upon discharge from inpatient rehab: {functional outcomes:304600100} PT, {functional outcomes:304600100} OT, {functional outcomes:304600100} SLP Estimated rehab length of stay to reach the above functional goals is: *** Anticipated discharge destination: {anticipated dc setting:21604} 10. Overall Rehab/Functional Prognosis: {potential:3041437}   MD Signature: ***

## 2021-12-19 NOTE — Progress Notes (Signed)
Inpatient Rehab Admissions Coordinator:    I continue to await insurance auth for CIR. I will follow for potential admit pending auth.   Hymen Arnett, MS, CCC-SLP Rehab Admissions Coordinator  [phone] (celll) [phone] (office)  

## 2021-12-19 NOTE — Plan of Care (Signed)
  Problem: Education: Goal: Knowledge of disease or condition will improve Outcome: Progressing   Problem: Coping: Goal: Will verbalize positive feelings about self Outcome: Progressing   Problem: Health Behavior/Discharge Planning: Goal: Ability to manage health-related needs will improve Outcome: Progressing   Problem: Self-Care: Goal: Ability to participate in self-care as condition permits will improve Outcome: Progressing Goal: Ability to communicate needs accurately will improve Outcome: Progressing   Problem: Nutrition: Goal: Risk of aspiration will decrease Outcome: Progressing   Problem: Education: Goal: Knowledge of General Education information will improve Description: Including pain rating scale, medication(s)/side effects and non-pharmacologic comfort measures Outcome: Progressing   Problem: Health Behavior/Discharge Planning: Goal: Ability to manage health-related needs will improve Outcome: Progressing   Problem: Clinical Measurements: Goal: Ability to maintain clinical measurements within normal limits will improve Outcome: Progressing Goal: Diagnostic test results will improve Outcome: Progressing Goal: Cardiovascular complication will be avoided Outcome: Progressing   Problem: Activity: Goal: Risk for activity intolerance will decrease Outcome: Progressing   Problem: Nutrition: Goal: Adequate nutrition will be maintained Outcome: Progressing   Problem: Coping: Goal: Level of anxiety will decrease Outcome: Progressing

## 2021-12-20 ENCOUNTER — Inpatient Hospital Stay (HOSPITAL_COMMUNITY): Payer: Medicare (Managed Care)

## 2021-12-20 ENCOUNTER — Inpatient Hospital Stay (HOSPITAL_COMMUNITY)
Admission: RE | Admit: 2021-12-20 | Discharge: 2022-01-01 | DRG: 057 | Disposition: A | Discharge status: Home / Self Care | Payer: Medicare (Managed Care) | Source: Intra-hospital | Attending: Physical Medicine & Rehabilitation | Admitting: Physical Medicine & Rehabilitation

## 2021-12-20 ENCOUNTER — Encounter (HOSPITAL_COMMUNITY): Payer: Self-pay | Admitting: Family Medicine

## 2021-12-20 DIAGNOSIS — F32A Depression, unspecified: Secondary | ICD-10-CM | POA: Diagnosis present

## 2021-12-20 DIAGNOSIS — F419 Anxiety disorder, unspecified: Secondary | ICD-10-CM | POA: Diagnosis present

## 2021-12-20 DIAGNOSIS — E871 Hypo-osmolality and hyponatremia: Secondary | ICD-10-CM | POA: Diagnosis not present

## 2021-12-20 DIAGNOSIS — N179 Acute kidney failure, unspecified: Secondary | ICD-10-CM | POA: Diagnosis not present

## 2021-12-20 DIAGNOSIS — Z602 Problems related to living alone: Secondary | ICD-10-CM | POA: Diagnosis present

## 2021-12-20 DIAGNOSIS — K5900 Constipation, unspecified: Secondary | ICD-10-CM | POA: Diagnosis not present

## 2021-12-20 DIAGNOSIS — M47816 Spondylosis without myelopathy or radiculopathy, lumbar region: Secondary | ICD-10-CM | POA: Diagnosis not present

## 2021-12-20 DIAGNOSIS — Z87891 Personal history of nicotine dependence: Secondary | ICD-10-CM

## 2021-12-20 DIAGNOSIS — I482 Chronic atrial fibrillation, unspecified: Secondary | ICD-10-CM | POA: Diagnosis not present

## 2021-12-20 DIAGNOSIS — M545 Low back pain, unspecified: Secondary | ICD-10-CM | POA: Diagnosis not present

## 2021-12-20 DIAGNOSIS — I480 Paroxysmal atrial fibrillation: Secondary | ICD-10-CM | POA: Diagnosis present

## 2021-12-20 DIAGNOSIS — Z79899 Other long term (current) drug therapy: Secondary | ICD-10-CM | POA: Diagnosis not present

## 2021-12-20 DIAGNOSIS — G4700 Insomnia, unspecified: Secondary | ICD-10-CM | POA: Diagnosis present

## 2021-12-20 DIAGNOSIS — E876 Hypokalemia: Secondary | ICD-10-CM | POA: Diagnosis present

## 2021-12-20 DIAGNOSIS — I10 Essential (primary) hypertension: Secondary | ICD-10-CM | POA: Diagnosis not present

## 2021-12-20 DIAGNOSIS — Z90710 Acquired absence of both cervix and uterus: Secondary | ICD-10-CM

## 2021-12-20 DIAGNOSIS — M48061 Spinal stenosis, lumbar region without neurogenic claudication: Secondary | ICD-10-CM | POA: Diagnosis not present

## 2021-12-20 DIAGNOSIS — I959 Hypotension, unspecified: Secondary | ICD-10-CM | POA: Diagnosis not present

## 2021-12-20 DIAGNOSIS — Z8249 Family history of ischemic heart disease and other diseases of the circulatory system: Secondary | ICD-10-CM

## 2021-12-20 DIAGNOSIS — H9190 Unspecified hearing loss, unspecified ear: Secondary | ICD-10-CM | POA: Diagnosis present

## 2021-12-20 DIAGNOSIS — Z96651 Presence of right artificial knee joint: Secondary | ICD-10-CM | POA: Diagnosis present

## 2021-12-20 DIAGNOSIS — Z881 Allergy status to other antibiotic agents status: Secondary | ICD-10-CM

## 2021-12-20 DIAGNOSIS — J449 Chronic obstructive pulmonary disease, unspecified: Secondary | ICD-10-CM | POA: Diagnosis present

## 2021-12-20 DIAGNOSIS — I639 Cerebral infarction, unspecified: Secondary | ICD-10-CM | POA: Diagnosis present

## 2021-12-20 DIAGNOSIS — Z880 Allergy status to penicillin: Secondary | ICD-10-CM

## 2021-12-20 DIAGNOSIS — R5381 Other malaise: Secondary | ICD-10-CM | POA: Diagnosis not present

## 2021-12-20 DIAGNOSIS — I69398 Other sequelae of cerebral infarction: Secondary | ICD-10-CM | POA: Diagnosis not present

## 2021-12-20 DIAGNOSIS — G629 Polyneuropathy, unspecified: Secondary | ICD-10-CM | POA: Diagnosis present

## 2021-12-20 DIAGNOSIS — G8929 Other chronic pain: Secondary | ICD-10-CM | POA: Diagnosis not present

## 2021-12-20 DIAGNOSIS — N1330 Unspecified hydronephrosis: Secondary | ICD-10-CM | POA: Diagnosis present

## 2021-12-20 DIAGNOSIS — M792 Neuralgia and neuritis, unspecified: Secondary | ICD-10-CM | POA: Diagnosis not present

## 2021-12-20 DIAGNOSIS — Z7901 Long term (current) use of anticoagulants: Secondary | ICD-10-CM

## 2021-12-20 DIAGNOSIS — E7800 Pure hypercholesterolemia, unspecified: Secondary | ICD-10-CM

## 2021-12-20 DIAGNOSIS — R42 Dizziness and giddiness: Secondary | ICD-10-CM | POA: Diagnosis not present

## 2021-12-20 LAB — BASIC METABOLIC PANEL
Anion gap: 14 (ref 5–15)
BUN: 21 mg/dL (ref 8–23)
CO2: 23 mmol/L (ref 22–32)
Calcium: 9 mg/dL (ref 8.9–10.3)
Chloride: 96 mmol/L — ABNORMAL LOW (ref 98–111)
Creatinine, Ser: 1.04 mg/dL — ABNORMAL HIGH (ref 0.44–1.00)
GFR, Estimated: 53 mL/min — ABNORMAL LOW (ref >60)
Glucose, Bld: 120 mg/dL — ABNORMAL HIGH (ref 70–99)
Potassium: 4.7 mmol/L (ref 3.5–5.1)
Sodium: 133 mmol/L — ABNORMAL LOW (ref 135–145)

## 2021-12-20 MED ORDER — POLYETHYLENE GLYCOL 3350 17 G PO PACK: 17.0000 g | PACK | Freq: Every day | ORAL | Status: DC | PRN

## 2021-12-20 MED ORDER — PROCHLORPERAZINE MALEATE 5 MG PO TABS
5.0000 mg | ORAL_TABLET | Freq: Four times a day (QID) | ORAL | Status: DC | PRN
  Administered 2021-12-24: 10 mg via ORAL
  Filled 2021-12-20: qty 2

## 2021-12-20 MED ORDER — DIPHENHYDRAMINE HCL 12.5 MG/5ML PO ELIX: 12.5000 mg | ORAL_SOLUTION | Freq: Four times a day (QID) | ORAL | Status: DC | PRN

## 2021-12-20 MED ORDER — ATENOLOL 25 MG PO TABS
12.5000 mg | ORAL_TABLET | Freq: Every day | ORAL | Status: DC
  Administered 2021-12-21 – 2022-01-01 (×11): 12.5 mg via ORAL
  Filled 2021-12-20 (×12): qty 1

## 2021-12-20 MED ORDER — AMLODIPINE BESYLATE 10 MG PO TABS
10.0000 mg | ORAL_TABLET | Freq: Every day | ORAL | Status: DC
  Administered 2021-12-21 – 2021-12-25 (×5): 10 mg via ORAL
  Filled 2021-12-20 (×5): qty 1

## 2021-12-20 MED ORDER — APIXABAN 5 MG PO TABS
5.0000 mg | ORAL_TABLET | Freq: Two times a day (BID) | ORAL | Status: DC
  Administered 2021-12-20 – 2022-01-01 (×24): 5 mg via ORAL
  Filled 2021-12-20 (×25): qty 1

## 2021-12-20 MED ORDER — ACETAMINOPHEN 325 MG PO TABS
325.0000 mg | ORAL_TABLET | ORAL | Status: DC | PRN
  Administered 2021-12-20 – 2021-12-28 (×5): 650 mg via ORAL
  Filled 2021-12-20 (×4): qty 2

## 2021-12-20 MED ORDER — APIXABAN 5 MG PO TABS: 5.0000 mg | ORAL_TABLET | Freq: Two times a day (BID) | ORAL | Status: DC

## 2021-12-20 MED ORDER — FLEET ENEMA 7-19 GM/118ML RE ENEM: 1.0000 | ENEMA | Freq: Once | RECTAL | Status: DC | PRN

## 2021-12-20 MED ORDER — ALUM & MAG HYDROXIDE-SIMETH 200-200-20 MG/5ML PO SUSP
30.0000 mL | ORAL | Status: DC | PRN
  Administered 2021-12-24: 30 mL via ORAL
  Filled 2021-12-20: qty 30

## 2021-12-20 MED ORDER — POLYETHYLENE GLYCOL 3350 17 G PO PACK
17.0000 g | PACK | Freq: Every day | ORAL | Status: DC
  Administered 2021-12-20 – 2021-12-25 (×5): 17 g via ORAL
  Filled 2021-12-20 (×12): qty 1

## 2021-12-20 MED ORDER — LOSARTAN POTASSIUM 50 MG PO TABS
100.0000 mg | ORAL_TABLET | Freq: Every day | ORAL | Status: DC
  Administered 2021-12-21 – 2022-01-01 (×11): 100 mg via ORAL
  Filled 2021-12-20 (×12): qty 2

## 2021-12-20 MED ORDER — GUAIFENESIN-DM 100-10 MG/5ML PO SYRP: 5.0000 mL | ORAL_SOLUTION | Freq: Four times a day (QID) | ORAL | Status: DC | PRN

## 2021-12-20 MED ORDER — BISACODYL 10 MG RE SUPP: 10.0000 mg | Freq: Every day | RECTAL | Status: DC | PRN

## 2021-12-20 MED ORDER — LIDOCAINE 5 % EX PTCH
1.0000 | MEDICATED_PATCH | Freq: Every day | CUTANEOUS | Status: DC
  Administered 2021-12-20 – 2021-12-22 (×2): 1 via TRANSDERMAL
  Filled 2021-12-20 (×3): qty 1

## 2021-12-20 MED ORDER — PROCHLORPERAZINE 25 MG RE SUPP: 12.5000 mg | Freq: Four times a day (QID) | RECTAL | Status: DC | PRN

## 2021-12-20 MED ORDER — PROCHLORPERAZINE EDISYLATE 10 MG/2ML IJ SOLN: 5.0000 mg | Freq: Four times a day (QID) | INTRAMUSCULAR | Status: DC | PRN

## 2021-12-20 MED ORDER — MELATONIN 3 MG PO TABS
3.0000 mg | ORAL_TABLET | Freq: Every evening | ORAL | Status: DC | PRN
  Administered 2021-12-25 – 2021-12-26 (×2): 3 mg via ORAL
  Filled 2021-12-20 (×2): qty 1

## 2021-12-20 MED ORDER — LIDOCAINE HCL URETHRAL/MUCOSAL 2 % EX GEL: CUTANEOUS | Status: DC | PRN

## 2021-12-20 MED ORDER — PANTOPRAZOLE SODIUM 40 MG PO TBEC
40.0000 mg | DELAYED_RELEASE_TABLET | Freq: Every day | ORAL | Status: DC
  Administered 2021-12-21 – 2022-01-01 (×12): 40 mg via ORAL
  Filled 2021-12-20 (×12): qty 1

## 2021-12-20 MED ORDER — GABAPENTIN 100 MG PO CAPS
100.0000 mg | ORAL_CAPSULE | Freq: Every day | ORAL | Status: DC
  Administered 2021-12-20 – 2021-12-21 (×2): 100 mg via ORAL
  Filled 2021-12-20 (×2): qty 1

## 2021-12-20 MED ORDER — ATENOLOL 25 MG PO TABS: 12.5000 mg | ORAL_TABLET | Freq: Every day | ORAL | Status: DC

## 2021-12-20 MED ORDER — SERTRALINE HCL 100 MG PO TABS
100.0000 mg | ORAL_TABLET | Freq: Every day | ORAL | Status: DC
  Administered 2021-12-20 – 2021-12-31 (×12): 100 mg via ORAL
  Filled 2021-12-20 (×12): qty 1

## 2021-12-20 MED ORDER — METOPROLOL TARTRATE 5 MG/5ML IV SOLN: 2.5000 mg | Freq: Four times a day (QID) | INTRAVENOUS | Status: DC | PRN

## 2021-12-20 MED ORDER — ROSUVASTATIN CALCIUM 20 MG PO TABS
20.0000 mg | ORAL_TABLET | Freq: Every day | ORAL | Status: DC
  Administered 2021-12-21 – 2022-01-01 (×12): 20 mg via ORAL
  Filled 2021-12-20 (×12): qty 1

## 2021-12-20 NOTE — H&P (Shared)
Physical Medicine and Rehabilitation Admission H&P    Chief Complaint  Patient presents with   Functional deficits     HPI: [name] is an 87 year old female with history of HTN, PAF (patient had d/c coumadin due to sister's bleed hx), L-CEA, COPD, lumbar spondylosis, SBO who was admitted on 12/14/21 with reports of dizziness and nausea w/emesis and inability to get out of bed. She also reported fall a week PTA and that she struck her head without any significant injuries. She was noted to have A Fib with elevated BP and K-2.8. CTA head/neck was negative for LVO and showed complex plaque L-ICA origin w/60% stenosis and evidence of R-ICA fibromuscular dysplasia. MRI brain showed normal brain for age and no acute abnormality. Dr. Leonel Ramsay felt that patient with small cerebellar stroke not seen on Xray and likely embolic due to PAF. She was started on eliquis as patient agreeable. 2 D echo showed EF 55-60% with mild aortic valve calcification  w/trivial AVR.  LDL-27 and at goal. Anxiety managed with IV ativan, she was treated with IVF and hypokalemia has resolved with supplementation.   KUB showed moderate to marked and moderate left hydronephrosis with markedly distended bladder concerning for obstruction --foley recommended.   Also reported to have horizontal nystagmus with dizziness with movement due to vestibular dysfunction. On 11/26, she developed  A fib w/RVR --rates up to 170 treated with IV metoprolol with rates back in 70's. Therapy has been working with patient who continues to be limited by anxiety, dizziness, nausea, pain in BLE and feet, shakiness/tremors with standing,  attempts at vestibular rehab as well as anxiety management. CIR recommended due to functional decline.     Review of Systems  Constitutional:  Negative for chills and fever.       Sedentary--sits and reads most days. Gets tired easily.  HENT:  Positive for hearing loss (hears better in left ear).   Eyes:   Negative for blurred vision and double vision.  Respiratory:  Positive for shortness of breath. Negative for cough.   Cardiovascular:  Negative for chest pain.  Gastrointestinal:  Positive for diarrhea. Negative for heartburn and nausea.  Genitourinary:  Positive for frequency and urgency.       Just comes at times  Musculoskeletal:  Positive for neck pain. Negative for back pain.  Neurological:  Positive for dizziness, sensory change (bilateral feet hurt (severe stinging pain) with movement) and weakness.  Psychiatric/Behavioral:  The patient has insomnia.      Past Medical History:  Diagnosis Date   Arthritis    Atrial fibrillation (HCC)    Carotid artery occlusion    Colitis    COPD (chronic obstructive pulmonary disease) (HCC)    Diverticulitis    Hypertension    Keratosis, seborrheic     Past Surgical History:  Procedure Laterality Date   ABDOMINAL HYSTERECTOMY     BREAST EXCISIONAL BIOPSY Left    BREAST EXCISIONAL BIOPSY Left    BREAST EXCISIONAL BIOPSY Right    BREAST SURGERY     cyst removal   CAROTID ENDARTERECTOMY Left 2010   CHOLECYSTECTOMY     EYE SURGERY Bilateral 06/2014   cataracts   JOINT REPLACEMENT     RIGHT KNEE   REPLACEMENT TOTAL KNEE Right     Family History  Problem Relation Age of Onset   Heart disease Mother        After age 65   AAA (abdominal aortic aneurysm) Sister  AAA (abdominal aortic aneurysm) Brother    Heart attack Brother    Breast cancer Cousin    Breast cancer Other    Breast cancer Other     Social History: Lives alone and niece to provide 24/7 care after discharge. 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 She  reports that she quit smoking about 19 years ago. Her smoking use included cigarettes. She has a 75.00 pack-year smoking history. She has never used smokeless tobacco. She reports that she does not drink alcohol anymore. and does not use drugs.   Allergies  Allergen Reactions    Amoxicillin Nausea Only and Rash    Has patient had a PCN reaction causing immediate rash, facial/tongue/throat swelling, SOB or lightheadedness with hypotension: Yes Has patient had a PCN reaction causing severe rash involving mucus membranes or skin necrosis: No Has patient had a PCN reaction that required hospitalization No Has patient had a PCN reaction occurring within the last 10 years: No If all of the above answers are "NO", then may proceed with Cephalosporin use.    Tetracyclines & Related Nausea Only and Rash    Medications Prior to Admission  Medication Sig Dispense Refill   acetaminophen (TYLENOL) 500 MG tablet Take 1,000 mg by mouth every 6 (six) hours as needed for mild pain, moderate pain, fever or headache.     acetaminophen (TYLENOL) 650 MG CR tablet Take 650-1,300 mg by mouth every 8 (eight) hours as needed for pain.     amLODipine (NORVASC) 10 MG tablet Take 1 tablet (10 mg total) by mouth daily. 90 tablet 3   aspirin EC 81 MG tablet Take 1 tablet (81 mg total) by mouth daily. 90 tablet 3   atenolol (TENORMIN) 25 MG tablet Take 25 mg by mouth daily with breakfast.   3   diclofenac Sodium (VOLTAREN) 1 % GEL Apply 2 g topically 4 (four) times daily. (Patient taking differently: Apply 2 g topically 4 (four) times daily as needed (pain).) 150 g 1   LORazepam (ATIVAN) 1 MG tablet Take 0.5-1 mg by mouth 2 (two) times daily.     losartan (COZAAR) 100 MG tablet Take 100 mg by mouth daily with breakfast.      Multiple Vitamin (MULTIVITAMIN PO) Take 1 tablet by mouth daily.     omeprazole (PRILOSEC) 40 MG capsule Take 40 mg by mouth daily.     oxybutynin (DITROPAN) 5 MG tablet Take 1 tablet by mouth every morning.     rosuvastatin (CRESTOR) 20 MG tablet TAKE 1 TABLET BY MOUTH  DAILY (Patient taking differently: Take 20 mg by mouth daily.) 90 tablet 3   sertraline (ZOLOFT) 100 MG tablet Take 100 mg by mouth at bedtime.      sodium chloride (OCEAN) 0.65 % SOLN nasal spray Place 1  spray into both nostrils 2 (two) times daily.        Home: Home Living Family/patient expects to be discharged to:: Private residence Living Arrangements: Alone Available Help at Discharge: Family, Available 24 hours/day (states neice or step daughter could stay) Type of Home: House Home Access: Stairs to enter [employer] of Steps: 3 Entrance Stairs-Rails: Left, Right Home Layout: One level Bathroom Shower/Tub: Multimedia programmer: Standard Bathroom Accessibility: Yes Home Equipment: Conservation officer, nature (2 wheels), [employer] - single point  Lives With: Alone   Functional History: Prior Function Prior Level of Function : Independent/Modified Independent Mobility Comments: uses cane intermittently, reports her RW goes crooked  Functional Status:  Mobility: Bed Mobility Overal bed mobility: Needs Assistance Bed Mobility: Supine to Sit Supine to sit: Mod assist, HOB elevated General bed mobility comments: up in recliner Transfers Overall transfer level: Needs assistance Equipment used: Rolling walker (2 wheels) Transfers: Sit to/from Stand, Bed to chair/wheelchair/BSC Sit to Stand: Mod assist Bed to/from chair/wheelchair/BSC transfer type:: Step pivot Stand pivot transfers: Mod assist General transfer comment: Pt reporting she could not walk due to pain and shakiness (pt visibly trembling in standing). Assisted to [facility] with pt with very small steps and at times shimmying her feet in order to pivot and get close enought to [facility] to sit down; transfer BSC to chair with better ability to step and clear her feet, but still limited by bil knee and ankle pain Ambulation/Gait Ambulation/Gait assistance: Mod assist Gait Distance (Feet): 25 Feet Assistive device: Rolling walker (2 wheels) Gait Pattern/deviations: Decreased stride length, Trunk flexed, Wide base of support, Step-to pattern General Gait Details: pt unsafe to ambulate with +1 assist due to bil LE pain and  trembling/shaking Gait velocity: significantly decrease Gait velocity interpretation: <1.31 ft/sec, indicative of household ambulator    ADL: ADL Overall ADL's : Needs assistance/impaired Eating/Feeding: Set up, Sitting Eating/Feeding Details (indicate cue type and reason): Set-up A to open packages Grooming: Wash/dry hands, Wash/dry face, Oral care, Moderate assistance, Standing Grooming Details (indicate cue type and reason): Pt stood at sink but was very tremulous with all movement and was betting holding to sink with one hand. Pt did not tolerate standing long before asking to sit. Upper Body Bathing: Minimal assistance, Sitting Lower Body Bathing: Moderate assistance, Sit to/from stand Lower Body Bathing Details (indicate cue type and reason): Mod A in sitting/standing secondary to balance deficits and generalized weakness. Upper Body Dressing : Minimal assistance, Sitting Lower Body Dressing: Moderate assistance, Sit to/from stand Lower Body Dressing Details (indicate cue type and reason): Pt required mod assist to start pants over feet and mod assist to stand. Pt has not been up most of weekend therefore unable to let go of walker to pull pants up without mod assist.  Pt with knee pain after being in bed all day yesterday. Deferred donning socks due to knee pain. Toilet Transfer: Moderate assistance, Rolling walker (2 wheels), Stand-pivot, BSC/3in1 Toilet Transfer Details (indicate cue type and reason): Pt weak from being in bed over weekend and declined walking to bathroom. Pt transferred to [facility] with min assist. Pt with small steps and wanting to sit. Toileting- Clothing Manipulation and Hygiene: Minimal assistance, Sit to/from stand Toileting - Clothing Manipulation Details (indicate cue type and reason): mod assist for hygiene in standing due to not being able to let go of walker due to unsteadiness. Functional mobility during ADLs: Moderate assistance General ADL Comments: Pt  limited with adls due to poor balance, anxiety and weakness.  Cognition: Cognition Overall Cognitive Status: Within Functional Limits for tasks assessed Orientation Level: Oriented X4 Cognition Arousal/Alertness: Awake/alert Behavior During Therapy: Anxious Overall Cognitive Status: Within Functional Limits for tasks assessed General Comments: but not formally assessed, a little distracted by headache pain and dizziness/nausea   Blood pressure (!) 132/47, pulse 90, temperature 98.3 F (36.8 C), temperature source Oral, resp. rate 16, height 5' 4.5" (1.638 m), weight 69 kg, SpO2 99 %. Physical Exam Vitals and nursing note reviewed.  Constitutional:      Appearance: Normal appearance.  Cardiovascular:     Rate and Rhythm: Rhythm irregular.  Abdominal:     General: Abdomen is  flat. There is no distension.     Palpations: Abdomen is soft.  Musculoskeletal:     Comments: Tight heel cords and pain with attempts at ROM as well as with palpation.   Skin:    General: Skin is warm and dry.  Neurological:     Mental Status: She is alert.     Comments: Very HOH--hears better in left ear. Speech clear and able to follow simple motor commands. Inattention to LUE?     Results for orders placed or performed during the hospital encounter of 12/14/21 (from the past 48 hour(s))  CBC with Differential/Platelet     Status: Abnormal   Collection Time: 12/19/21  8:19 AM  Result Value Ref Range   WBC 10.8 (H) 4.0 - 10.5 K/uL   RBC 4.80 3.87 - 5.11 MIL/uL   Hemoglobin 13.3 12.0 - 15.0 g/dL   HCT 56.3 14.9 - 70.2 %   MCV 84.6 80.0 - 100.0 fL   MCH 27.7 26.0 - 34.0 pg   MCHC 32.8 30.0 - 36.0 g/dL   RDW 63.7 85.8 - 85.0 %   Platelets 262 150 - 400 K/uL   nRBC 0.0 0.0 - 0.2 %   Neutrophils Relative % 80 %   Neutro Abs 8.7 (H) 1.7 - 7.7 K/uL   Lymphocytes Relative 10 %   Lymphs Abs 1.1 0.7 - 4.0 K/uL   Monocytes Relative 9 %   Monocytes Absolute 1.0 0.1 - 1.0 K/uL   Eosinophils Relative 1 %    Eosinophils Absolute 0.1 0.0 - 0.5 K/uL   Basophils Relative 0 %   Basophils Absolute 0.0 0.0 - 0.1 K/uL   Immature Granulocytes 0 %   Abs Immature Granulocytes 0.03 0.00 - 0.07 K/uL    Comment: Performed at [facility], [address], [place], [place] [address]  Basic metabolic panel     Status: Abnormal   Collection Time: 12/19/21  8:19 AM  Result Value Ref Range   Sodium 133 (L) 135 - 145 mmol/L   Potassium 3.2 (L) 3.5 - 5.1 mmol/L   Chloride 97 (L) 98 - 111 mmol/L   CO2 24 22 - 32 mmol/L   Glucose, Bld 100 (H) 70 - 99 mg/dL    Comment: Glucose reference range applies only to samples taken after fasting for at least 8 hours.   BUN 17 8 - 23 mg/dL   Creatinine, Ser 2.87 0.44 - 1.00 mg/dL   Calcium 8.8 (L) 8.9 - 10.3 mg/dL   GFR, Estimated >86 >76 mL/min    Comment: (NOTE) Calculated using the CKD-EPI Creatinine Equation (2021)    Anion gap 12 5 - 15    Comment: Performed at [facility], [address] [place], [place] [address]   No results found.    Blood pressure (!) 132/47, pulse 90, temperature 98.3 F (36.8 C), temperature source Oral, resp. rate 16, height 5' 4.5" (1.638 m), weight 69 kg, SpO2 99 %.  Medical Problem List and Plan: 1. Functional deficits secondary to ***  -patient may *** shower  -ELOS/Goals: *** 2.  Antithrombotics: -DVT/anticoagulation:  Pharmaceutical: Eliquis  -antiplatelet therapy: N/a 3. Pain Management: will add low dose gabapentin to help with neuropathy BLE 4. Mood/Behavior/Sleep:  LCSW to follow for evaluation and support.   --melatonin prn for insomnia. Gabapentin should help alsoe.   -antipsychotic agents: N/a 5. Neuropsych/cognition: This patient is capable of making decisions on her own behalf. 6. Skin/Wound Care: Routine pressure relief measures.  7. Fluids/Electrolytes/Nutrition: Monitor I/O. Check CMET in am. 8. A fib: Monitor HR TID--continue tenormin and Eliquis.  9. HTN: Monitor BP TID-continue Amlodipine,  Cozaar and tenormin.  10 AKI- has resolved. Recheck BUN/SCr in am.  11. Bilateral hydronephrosis on KUB: Will repeat KUB. Monitor voiding with PVR/bladder scans.   --d/c ditropan.   --Neurogenic bladder? As has hx of lumbar spondylosis with impingement and recent fall. 12. Constipation: Will add miralax 13. Recurrent hypokalemia: Supplemented X 1 today.  14. H/o Depression/anxiety: continue Zoloft and ativan prn 15. Neuropathy: Will order lumbar films w/recent fall.      ***  Jacquelynn Cree, PA-C 12/20/2021

## 2021-12-20 NOTE — Plan of Care (Signed)
  Problem: Education: Goal: Knowledge of disease or condition will improve Outcome: Progressing   Problem: Ischemic Stroke/TIA Tissue Perfusion: Goal: Complications of ischemic stroke/TIA will be minimized Outcome: Progressing   Problem: Health Behavior/Discharge Planning: Goal: Ability to manage health-related needs will improve Outcome: Progressing   

## 2021-12-20 NOTE — Care Management Important Message (Signed)
Important Message  Patient Details  Name: [name] MRN: [id number] Date of Birth: 1934-04-25   Medicare Important Message Given:  Yes     [name] Stefan Church 12/20/2021, 11:21 AM

## 2021-12-20 NOTE — Progress Notes (Signed)
Physical Therapy Treatment Patient Details Name: [name] MRN: [id number] DOB: 1934-04-17 Today's Date: 12/20/2021   History of Present Illness [name] is a 87 y.o. female with medical history significant of hypertension, paroxysmal atrial fibrillation not on anticoagulation, left carotid artery stenosis s/p endarterectomy, diastolic CHF, COPD, and small bowel obstruction who presented with complaints of dizziness and nausea and found to have small cerebellar CVA.    PT Comments    Pt greeted supine in bed and agreeable to session with encouragement, however pt continues to be limited by pain in ankles, shakiness in standing, and dizziness. Pt able to come to sitting EOB with increased time and min guard for safety. Pt demonstrating STS x5 throughout session with up to mod assist to power up as pt with guarded movements in anticipation of pain. Upon standing pt trembling, however with cues for breathing techniques and encouragement pt able to take steps to [facility] and march in place, with trembling subsiding. Pt reporting no improvement in pain with increased movement/mobility this session. MD present for portion of session and aware of ankle pain. Current plan remains appropriate to address deficits and maximize functional independence and decrease caregiver burden. Pt continues to benefit from skilled PT services to progress toward functional mobility goals.    Recommendations for follow up therapy are one component of a multi-disciplinary discharge planning process, led by the attending physician.  Recommendations may be updated based on patient status, additional functional criteria and insurance authorization.  Follow Up Recommendations  Acute inpatient rehab (3hours/day) Can patient physically be transported by private vehicle: No   Assistance Recommended at Discharge Frequent or constant Supervision/Assistance  Patient can return home with the following Assistance with  cooking/housework;Assist for transportation;Assistance with feeding;Help with stairs or ramp for entrance;Direct supervision/assist for financial management;A lot of help with walking and/or transfers   Equipment Recommendations  Rolling walker (2 wheels)    Recommendations for Other Services       Precautions / Restrictions Precautions Precautions: Fall Precaution Comments: dizziness Restrictions Weight Bearing Restrictions: No     Mobility  Bed Mobility Overal bed mobility: Needs Assistance Bed Mobility: Supine to Sit     Supine to sit: Mod assist, HOB elevated     General bed mobility comments: up in recliner    Transfers Overall transfer level: Needs assistance Equipment used: Rolling walker (2 wheels) Transfers: Sit to/from Stand, Bed to chair/wheelchair/BSC Sit to Stand: Mod assist           General transfer comment: Pt reporting she could not walk due to pain and shakiness (pt visibly trembling in standing). able to complet STS x5 throughout session and side step to [facility] with increased cues    Ambulation/Gait             Pre-gait activities: with cues for breathing techniques pt able to march in place x10     Stairs             Wheelchair Mobility    Modified Rankin (Stroke Patients Only) Modified Rankin (Stroke Patients Only) Pre-Morbid Rankin Score: No significant disability Modified Rankin: Moderately severe disability     Balance Overall balance assessment: Needs assistance Sitting-balance support: Feet supported Sitting balance-Leahy Scale: Fair Sitting balance - Comments: on EOB with S for balance, no LOB   Standing balance support: Bilateral upper extremity supported Standing balance-Leahy Scale: Poor Standing balance comment: UE support and min A for balance  Cognition Arousal/Alertness: Awake/alert Behavior During Therapy: Anxious Overall Cognitive Status: Within Functional Limits  for tasks assessed                                 General Comments: but not formally assessed, a little distracted by pain and dizziness/nausea        Exercises Other Exercises Other Exercises: forward weight shifting in sitting with hands on knees to increase weightbearing throughout ankles for warm up, heel raises in sitting.    General Comments        Pertinent Vitals/Pain Pain Assessment Pain Assessment: Faces Faces Pain Scale: Hurts whole lot Pain Location: feet, ankles Pain Descriptors / Indicators: Aching, Sore Pain Intervention(s): Monitored during session, Limited activity within patient's tolerance    Home Living                          Prior Function            PT Goals (current goals can now be found in the care plan section) Acute Rehab PT Goals Patient Stated Goal: to return to independent PT Goal Formulation: With patient Time For Goal Achievement: 12/29/21 Progress towards PT goals: Not progressing toward goals - comment (secondary to pain)    Frequency    Min 3X/week      PT Plan Current plan remains appropriate    Co-evaluation              AM-PAC PT "6 Clicks" Mobility   Outcome Measure  Help needed turning from your back to your side while in a flat bed without using bedrails?: A Lot Help needed moving from lying on your back to sitting on the side of a flat bed without using bedrails?: A Lot Help needed moving to and from a bed to a chair (including a wheelchair)?: A Lot Help needed standing up from a chair using your arms (e.g., wheelchair or bedside chair)?: A Lot Help needed to walk in hospital room?: Total Help needed climbing 3-5 steps with a railing? : Total 6 Click Score: 10    End of Session Equipment Utilized During Treatment: Gait belt Activity Tolerance: Patient limited by pain Patient left: with call bell/phone within reach;in bed;with bed alarm set Nurse Communication: Mobility status PT  Visit Diagnosis: Other abnormalities of gait and mobility (R26.89);Dizziness and giddiness (R42);Muscle weakness (generalized) (M62.81);Other symptoms and signs involving the nervous system (W29.562)     Time: 1308-6578 PT Time Calculation (min) (ACUTE ONLY): 23 min  Charges:  $Gait Training: 8-22 mins $Therapeutic Exercise: 8-22 mins                     Valentine Kuechle R. PTA Acute Rehabilitation Services Office: [phone]    Catalina Antigua 12/20/2021, 10:50 AM

## 2021-12-20 NOTE — H&P (Signed)
Physical Medicine and Rehabilitation Admission H&P    Chief Complaint  Patient presents with   Functional deficits     HPI: [name]. [name] is an 87 year old female with history of HTN, PAF (patient had d/c coumadin due to sister's bleed hx), L-CEA, COPD, lumbar spondylosis, SBO who was admitted on 12/14/21 with reports of dizziness and nausea w/emesis and inability to get out of bed. She also reported fall a week PTA and that she struck her head without any significant injuries. She was noted to have A Fib with elevated BP and K-2.8. CTA head/neck was negative for LVO and showed complex plaque L-ICA origin w/60% stenosis and evidence of R-ICA fibromuscular dysplasia. MRI brain showed normal brain for age and no acute abnormality. Dr. Amada Jupiter felt that patient with small cerebellar stroke not seen on Xray and likely embolic due to PAF. She was started on eliquis as patient agreeable. 2 D echo showed EF 55-60% with mild aortic valve calcification  w/trivial AVR.  LDL-27 and at goal. Anxiety managed with IV ativan, she was treated with IVF and hypokalemia has resolved with supplementation.   KUB showed moderate to marked and moderate left hydronephrosis with markedly distended bladder concerning for obstruction --foley recommended.   Also reported to have horizontal nystagmus with dizziness with movement due to vestibular dysfunction. On 11/26, she developed  A fib w/RVR --rates up to 170 treated with IV metoprolol with rates back in 70's. Therapy has been working with patient who continues to be limited by anxiety, dizziness, nausea, pain in BLE and feet, shakiness/tremors with standing,  attempts at vestibular rehab as well as anxiety management. CIR recommended due to functional decline. She is very hard of hearing.    Review of Systems  Constitutional:  Negative for chills and fever.       Sedentary--sits and reads most days. Gets tired easily.  HENT:  Positive for hearing loss (hears  better in left ear).   Eyes:  Negative for blurred vision and double vision.  Respiratory:  Positive for shortness of breath. Negative for cough.   Cardiovascular:  Negative for chest pain.  Gastrointestinal:  Positive for diarrhea. Negative for heartburn and nausea.  Genitourinary:  Positive for frequency and urgency.       Just comes at times  Musculoskeletal:  Positive for neck pain. Negative for back pain.  Neurological:  Positive for dizziness, sensory change (bilateral feet hurt (severe stinging pain) with movement) and weakness.  Psychiatric/Behavioral:  The patient has insomnia.      Past Medical History:  Diagnosis Date   Arthritis    Atrial fibrillation (HCC)    Carotid artery occlusion    Colitis    COPD (chronic obstructive pulmonary disease) (HCC)    Diverticulitis    Hypertension    Keratosis, seborrheic    Lumbar spondylosis    w/impingement L3-S1    Past Surgical History:  Procedure Laterality Date   ABDOMINAL HYSTERECTOMY     BREAST EXCISIONAL BIOPSY Left    BREAST EXCISIONAL BIOPSY Left    BREAST EXCISIONAL BIOPSY Right    BREAST SURGERY     cyst removal   CAROTID ENDARTERECTOMY Left 2010   CHOLECYSTECTOMY     EYE SURGERY Bilateral 06/2014   cataracts   JOINT REPLACEMENT     RIGHT KNEE   REPLACEMENT TOTAL KNEE Right     Family History  Problem Relation Age of Onset   Heart disease Mother  After age 22   AAA (abdominal aortic aneurysm) Sister    AAA (abdominal aortic aneurysm) Brother    Heart attack Brother    Breast cancer Cousin    Breast cancer Other    Breast cancer Other     Social History: Lives alone and niece to provide 24/7 care after discharge. 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 She  reports that she quit smoking about 19 years ago. Her smoking use included cigarettes. She has a 75.00 pack-year smoking history. She has never used smokeless tobacco. She reports that she does not drink  alcohol anymore. and does not use drugs.   Allergies  Allergen Reactions   Amoxicillin Nausea Only and Rash    Has patient had a PCN reaction causing immediate rash, facial/tongue/throat swelling, SOB or lightheadedness with hypotension: Yes Has patient had a PCN reaction causing severe rash involving mucus membranes or skin necrosis: No Has patient had a PCN reaction that required hospitalization No Has patient had a PCN reaction occurring within the last 10 years: No If all of the above answers are "NO", then may proceed with Cephalosporin use.    Tetracyclines & Related Nausea Only and Rash    Medications Prior to Admission  Medication Sig Dispense Refill   acetaminophen (TYLENOL) 500 MG tablet Take 1,000 mg by mouth every 6 (six) hours as needed for mild pain, moderate pain, fever or headache.     acetaminophen (TYLENOL) 650 MG CR tablet Take 650-1,300 mg by mouth every 8 (eight) hours as needed for pain.     amLODipine (NORVASC) 10 MG tablet Take 1 tablet (10 mg total) by mouth daily. 90 tablet 3   apixaban (ELIQUIS) 5 MG TABS tablet Take 1 tablet (5 mg total) by mouth 2 (two) times daily. 60 tablet    [START ON 12/21/2021] atenolol (TENORMIN) 25 MG tablet Take 0.5 tablets (12.5 mg total) by mouth daily with breakfast.     diclofenac Sodium (VOLTAREN) 1 % GEL Apply 2 g topically 4 (four) times daily. (Patient taking differently: Apply 2 g topically 4 (four) times daily as needed (pain).) 150 g 1   LORazepam (ATIVAN) 1 MG tablet Take 0.5-1 mg by mouth 2 (two) times daily.     losartan (COZAAR) 100 MG tablet Take 100 mg by mouth daily with breakfast.      Multiple Vitamin (MULTIVITAMIN PO) Take 1 tablet by mouth daily.     omeprazole (PRILOSEC) 40 MG capsule Take 40 mg by mouth daily.     oxybutynin (DITROPAN) 5 MG tablet Take 1 tablet by mouth every morning.     rosuvastatin (CRESTOR) 20 MG tablet TAKE 1 TABLET BY MOUTH  DAILY (Patient taking differently: Take 20 mg by mouth daily.)  90 tablet 3   sertraline (ZOLOFT) 100 MG tablet Take 100 mg by mouth at bedtime.      sodium chloride (OCEAN) 0.65 % SOLN nasal spray Place 1 spray into both nostrils 2 (two) times daily.     Home: Home Living Family/patient expects to be discharged to:: Private residence Living Arrangements: Alone Available Help at Discharge: Family, Available 24 hours/day (states neice or step daughter could stay) Type of Home: House Home Access: Stairs to enter [employer] of Steps: 3 Entrance Stairs-Rails: Left, Right Home Layout: One level Bathroom Shower/Tub: Health visitor: Standard Bathroom Accessibility: Yes Home Equipment: Agricultural consultant (2 wheels), [employer] - single point  Lives With: Alone   Functional History: Prior  Function Prior Level of Function : Independent/Modified Independent Mobility Comments: uses cane intermittently, reports her RW goes crooked   Functional Status:  Mobility: Bed Mobility Overal bed mobility: Needs Assistance Bed Mobility: Supine to Sit Supine to sit: Mod assist, HOB elevated General bed mobility comments: up in recliner Transfers Overall transfer level: Needs assistance Equipment used: Rolling walker (2 wheels) Transfers: Sit to/from Stand, Bed to chair/wheelchair/BSC Sit to Stand: Mod assist Bed to/from chair/wheelchair/BSC transfer type:: Step pivot Stand pivot transfers: Mod assist General transfer comment: Pt reporting she could not walk due to pain and shakiness (pt visibly trembling in standing). [facility] with very small steps and at times shimmying her feet in order to pivot and get close [facility]ought [facility] down; transfer BSC to chair with better ability to step and clear her feet, but still limited by bil knee and ankle pain Ambulation/Gait Ambulation/Gait assistance: Mod assist Gait Distance (Feet): 25 Feet Assistive device: Rolling walker (2 wheels) Gait Pattern/deviations: Decreased stride length,  Trunk flexed, Wide base of support, Step-to pattern General Gait Details: pt unsafe to ambulate with +1 assist due to bil LE pain and trembling/shaking Gait velocity: significantly decrease Gait velocity interpretation: <1.31 ft/sec, indicative of household ambulator   ADL: ADL Overall ADL's : Needs assistance/impaired Eating/Feeding: Set up, Sitting Eating/Feeding Details (indicate cue type and reason): Set-up A to open packages Grooming: Wash/dry hands, Wash/dry face, Oral care, Moderate assistance, Standing Grooming Details (indicate cue type and reason): Pt stood at sink but was very tremulous with all movement and was betting holding to sink with one hand. Pt did not tolerate standing long before asking to sit. Upper Body Bathing: Minimal assistance, Sitting Lower Body Bathing: Moderate assistance, Sit to/from stand Lower Body Bathing Details (indicate cue type and reason): Mod A in sitting/standing secondary to balance deficits and generalized weakness. Upper Body Dressing : Minimal assistance, Sitting Lower Body Dressing: Moderate assistance, Sit to/from stand Lower Body Dressing Details (indicate cue type and reason): Pt required mod assist to start pants over feet and mod assist to stand. Pt has not been up most of weekend therefore unable to let go of walker to pull pants up without mod assist.  Pt with knee pain after being in bed all day yesterday. Deferred donning socks due to knee pain. Toilet Transfer: Moderate assistance, Rolling walker (2 wheels), Stand-pivot, BSC/3in1 Toilet Transfer Details (indicate cue type and reason): Pt weak from being in bed over weekend and declined walking to bathroom. [facility] assist. Pt with small steps and wanting to sit. Toileting- Clothing Manipulation and Hygiene: Minimal assistance, Sit to/from stand Toileting - Clothing Manipulation Details (indicate cue type and reason): mod assist for hygiene in standing due to not being  able to let go of walker due to unsteadiness. Functional mobility during ADLs: Moderate assistance General ADL Comments: Pt limited with adls due to poor balance, anxiety and weakness.   Cognition: Cognition Overall Cognitive Status: Within Functional Limits for tasks assessed Orientation Level: Oriented X4 Cognition Arousal/Alertness: Awake/alert Behavior During Therapy: Anxious Overall Cognitive Status: Within Functional Limits for tasks assessed General Comments: but not formally assessed, a little distracted by headache pain and dizziness/nausea    Blood pressure (!) 144/50, pulse 72, temperature 97.8 F (36.6 C), temperature source Oral, resp. rate 16, SpO2 95 %.  Physical Exam Vitals and nursing note reviewed.  Constitutional:      Appearance: Normal appearance.  HEENT: hard of hearing Cardiovascular:  Rate and Rhythm: Rhythm irregular. Pulmonary: breathing comfortably on room air  Abdominal:     General: Abdomen is flat. There is no distension.     Palpations: Abdomen is soft.  Musculoskeletal:     Comments: Tight heel cords and pain with attempts at ROM as well as with palpation.   Skin:    General: Skin is warm and dry.  Neurological:     Mental Status: She is alert.     Comments: Very HOH--hears better in left ear. Speech clear and able to follow simple motor commands. Inattention to LUE? 5/5 strength throughout. Sensation intact.  Psych: pleasant, friendly, and confident.    Results for orders placed or performed during the hospital encounter of 12/14/21 (from the past 48 hour(s))  CBC with Differential/Platelet     Status: Abnormal   Collection Time: 12/19/21  8:19 AM  Result Value Ref Range   WBC 10.8 (H) 4.0 - 10.5 K/uL   RBC 4.80 3.87 - 5.11 MIL/uL   Hemoglobin 13.3 12.0 - 15.0 g/dL   HCT 88.5 02.7 - 74.1 %   MCV 84.6 80.0 - 100.0 fL   MCH 27.7 26.0 - 34.0 pg   MCHC 32.8 30.0 - 36.0 g/dL   RDW 28.7 86.7 - 67.2 %   Platelets 262 150 - 400 K/uL    nRBC 0.0 0.0 - 0.2 %   Neutrophils Relative % 80 %   Neutro Abs 8.7 (H) 1.7 - 7.7 K/uL   Lymphocytes Relative 10 %   Lymphs Abs 1.1 0.7 - 4.0 K/uL   Monocytes Relative 9 %   Monocytes Absolute 1.0 0.1 - 1.0 K/uL   Eosinophils Relative 1 %   Eosinophils Absolute 0.1 0.0 - 0.5 K/uL   Basophils Relative 0 %   Basophils Absolute 0.0 0.0 - 0.1 K/uL   Immature Granulocytes 0 %   Abs Immature Granulocytes 0.03 0.00 - 0.07 K/uL    Comment: Performed at [facility], [address] [place], [place] [address]  Basic metabolic panel     Status: Abnormal   Collection Time: 12/19/21  8:19 AM  Result Value Ref Range   Sodium 133 (L) 135 - 145 mmol/L   Potassium 3.2 (L) 3.5 - 5.1 mmol/L   Chloride 97 (L) 98 - 111 mmol/L   CO2 24 22 - 32 mmol/L   Glucose, Bld 100 (H) 70 - 99 mg/dL    Comment: Glucose reference range applies only to samples taken after fasting for at least 8 hours.   BUN 17 8 - 23 mg/dL   Creatinine, Ser 9.62 0.44 - 1.00 mg/dL   Calcium 8.8 (L) 8.9 - 10.3 mg/dL   GFR, Estimated >83 >66 mL/min    Comment: (NOTE) Calculated using the CKD-EPI Creatinine Equation (2021)    Anion gap 12 5 - 15    Comment: Performed at [facility], [address] [place], [place] [address]   No results found.    Blood pressure (!) 144/50, pulse 72, temperature 97.8 F (36.6 C), temperature source Oral, resp. rate 16, SpO2 95 %.  Medical Problem List and Plan: 1. Functional deficits secondary to small right cerebellar stroke.   -patient may shower  -ELOS/Goals: 12-16 days S  -Admit to CIR 2.  Antithrombotics: -DVT/anticoagulation:  Pharmaceutical: Eliquis  -antiplatelet therapy: N/a 3. BLE neuropathy: will add low dose gabapentin 4. Insomnia: continue melatonin and gabapentin 5. Neuropsych/cognition: This patient is capable of making decisions on her own behalf. 6.  Skin/Wound Care: Routine pressure relief measures.  7. Fluids/Electrolytes/Nutrition: Monitor I/O. Check CMET  in am. 8. A fib: Monitor HR TID--continue tenormin and Eliquis.  9. HTN: Monitor BP TID- continue Amlodipine, Cozaar and tenormin.  10 AKI- has resolved. Recheck BUN/SCr in am.  11. Bilateral hydronephrosis on KUB: Will repeat KUB. Monitor voiding with PVR/bladder scans.   --d/c ditropan.   --Neurogenic bladder? As has hx of lumbar spondylosis with impingement and recent fall. 12. Constipation: Will add miralax 13. Recurrent hypokalemia: Supplemented X 1 today.  14. H/o Depression/anxiety: continue Zoloft and ativan prn 15. Neuropathy: Will order lumbar films w/recent fall.    I have personally performed a face to face diagnostic evaluation, including, but not limited to relevant history and physical exam findings, of this patient and developed relevant assessment and plan.  Additionally, I have reviewed and concur with the physician assistant's documentation above.  Jacquelynn Cree, PA-C   Horton Chin, MD 12/20/2021

## 2021-12-20 NOTE — TOC Transition Note (Signed)
Transition of Care [facility]) - CM/SW Discharge Note   Patient Details  Name: KIIRA BRACH MRN: [id number] Date of Birth: Jan 16, 1935  [facility] CM/SW Contact:  Kermit Balo, RN Phone Number: 12/20/2021, 10:37 AM   Clinical Narrative:    Pt discharging to CIR today. CM signing off.    Final next level of care: IP Rehab Facility Barriers to Discharge: No Barriers Identified   Patient Goals and CMS Choice Patient states their goals for this hospitalization and ongoing recovery are:: Rehab CMS Medicare.gov Compare Post Acute Care list provided to:: Patient Choice offered to / list presented to : Patient  Discharge Placement                       Discharge Plan and Services In-house Referral: Clinical Social Work   Post Acute Care Choice: IP Rehab                               Social Determinants of Health (SDOH) Interventions     Readmission Risk Interventions     No data to display

## 2021-12-20 NOTE — Progress Notes (Signed)
Inpatient Rehab Admissions Coordinator:  ° °I have a CIR bed for this Pt. Today. RN may call report to [phone]. ° °Charish Schroepfer, MS, CCC-SLP °Rehab Admissions Coordinator  °[phone] (celll) °[phone] (office) ° °

## 2021-12-20 NOTE — Progress Notes (Signed)
Inpatient Rehabilitation Admission Medication Review by a Pharmacist  A complete drug regimen review was completed for this patient to identify any potential clinically significant medication issues.  High Risk Drug Classes Is patient taking? Indication by Medication  Antipsychotic Yes Compazine- N/V  Anticoagulant Yes Apixaban- PAF  Antibiotic No   Opioid No   Antiplatelet No   Hypoglycemics/insulin No   Vasoactive Medication Yes po and IV Atenolol, norvasc, losartan, lopressor iv- HFpEF / HTN  Chemotherapy No        Other Yes Ativan- anxiety Protonix- GERD Crestor- HLD Zoloft- Anxiety Gabapentin- neuropathic pain     Type of Medication Issue Identified Description of Issue Recommendation(s)  Drug Interaction(s) (clinically significant)     Duplicate Therapy     Allergy     No Medication Administration End Date     Incorrect Dose     Additional Drug Therapy Needed     Significant med changes from prior encounter (inform family/care partners about these prior to discharge).    Other  PTA med: Ditropan Restart PTA meds when and if necessary during CIR admission or at time of discharge, if warranted    Clinically significant medication issues were identified that warrant physician communication and completion of prescribed/recommended actions by midnight of the next day:  No  Time spent performing this drug regimen review (minutes):  30   Elexus Barman BS, PharmD, BCPS Clinical Pharmacist 12/21/2021 7:12 AM  Contact: [phone] after 3 PM  "Be curious, not judgmental..." -Debbora Dus

## 2021-12-20 NOTE — Progress Notes (Signed)
PMR Admission Coordinator Pre-Admission Assessment   Patient: [name] is an 87 y.o., female MRN: [id number] DOB: [birth year] Height: 5' 4.5" (163.8 cm) Weight: 69 kg   Insurance Information HMO: yes    PPO:      PCP:      IPA:      80/20:      OTHER:  PRIMARY: [employer]       Policy#: [id number]      Subscriber: Pt.  CM Name: Utilization Management      Phone#: [phone]     Fax#: [phone] Pre-Cert#: [id number]  (on auth letter: [name])    Employer: Pt. Approved on 12/19/21  for admit for 5 days from date of admission.  Benefits:  Phone #:      Name:  [name] Date: 01/22/2021- still active Deductible: does not have one OOP Max: $3,950 ($0 met) CIR: $325/day copay for days 1-6, $0 copay for days 7-90 SNF: $10/day co-pay for days 1-20, $196/day co-pay for days 21-60; $0/day co-pay days 61-100 Outpatient: $20 copay/visit Home Health:  100% coverage; limited by medical necessity DME: 80% coverage, 20% co-insurance Providers: in [place]      SECONDARY:       Policy#:      Phone#:    Artist:       Phone#:    The Data processing manager" for patients in Inpatient Rehabilitation Facilities with attached "Privacy Act Statement-Health Care Records" was provided and verbally reviewed with: Patient   Emergency Contact Information Contact Information       Name Relation Home Work Mobile    [name] Niece [id number]   [phone]    [name] Niece [phone]               Current Medical History  Patient Admitting Diagnosis: CVA History of Present Illness: [name] is a 87 y.o. female with medical history significant of hypertension, paroxysmal atrial fibrillation not on anticoagulation, left carotid artery stenosis s/p endarterectomy, diastolic CHF, COPD, and small bowel obstruction who presented to the [facility] ED 12/14/21 with complaints of dizziness and nausea. In route with EMS patient was noted to have an irregular heart rhythm  120s, blood pressure 180/100, and all other vital signs maintained.    In the emergency department patient was noted to be in atrial fibrillation with blood pressures elevated up to 172/91, and O2 saturations maintained on room air.  Labs significant for WBC 10.8 (appears chronic), potassium 2.8, and glucose 177.  CTA of the head and neck noted no large vessel occlusion, but complex plaque in the left ICA origin, and concern for right cervical ICA fibromuscular dysplasia.  Patient has been given Ativan, Zofran,  potassium chloride 30 mEq IV, and 1 L of normal saline IV fluids.  MRI brain shows tiny right cerebellar stroke. Echo shows normal ejection fraction, grade 1 diastolic dysfunction.  No other abnormality. CTA head & neck negative for LVO, but complex plaque at the left ICA origin with possible adherent thrombus within the vessel lumen, 60% left ICA stenosis, right cervical ICA fibromuscular dysplasia.  Patient has been started on Eliquis, aspirin has been discontinued.  LDL<70, patient on 20 mg of Crestor.  Pt. Seen by PT/OT who recommend CIR to assist return to PLOF.    Complete NIHSS TOTAL: 0   Patient's medical record from [facility]  has been reviewed by the rehabilitation admission coordinator and physician.   Past Medical History  Past Medical History:  Diagnosis Date   Arthritis     Atrial fibrillation (HCC)     Carotid artery occlusion     Colitis     COPD (chronic obstructive pulmonary disease) (HCC)     Diverticulitis     Hypertension     Keratosis, seborrheic        Has the patient had major surgery during 100 days prior to admission? No   Family History   family history includes AAA (abdominal aortic aneurysm) in her brother and sister; Breast cancer in her cousin and other family members; Heart attack in her brother; Heart disease in her mother.   Current Medications   Current Facility-Administered Medications:    acetaminophen (TYLENOL) tablet  650 mg, 650 mg, Oral, Q4H PRN, 650 mg at 12/18/21 2103 **OR** acetaminophen (TYLENOL) 160 MG/5ML solution 650 mg, 650 mg, Per Tube, Q4H PRN **OR** acetaminophen (TYLENOL) suppository 650 mg, 650 mg, Rectal, Q4H PRN, Katrinka Blazing, Rondell A, MD   amLODipine (NORVASC) tablet 10 mg, 10 mg, Oral, Daily, Pahwani, Ravi, MD, 10 mg at 12/19/21 0820   apixaban (ELIQUIS) tablet 5 mg, 5 mg, Oral, BID, Pahwani, Ravi, MD, 5 mg at 12/19/21 0820   atenolol (TENORMIN) tablet 12.5 mg, 12.5 mg, Oral, Q breakfast, Hall, Carole N, DO, 12.5 mg at 12/19/21 0820   hydrALAZINE (APRESOLINE) injection 5 mg, 5 mg, Intravenous, Q6H PRN, Hall, Carole N, DO   lidocaine (LIDODERM) 5 % 1 patch, 1 patch, Transdermal, QHS, Hall, Carole N, DO, 1 patch at 12/18/21 2103   losartan (COZAAR) tablet 100 mg, 100 mg, Oral, Q breakfast, Pahwani, Ravi, MD, 100 mg at 12/19/21 0820   metoprolol tartrate (LOPRESSOR) injection 2.5 mg, 2.5 mg, Intravenous, Q6H PRN, Hall, Carole N, DO, 2.5 mg at 12/17/21 0535   oxybutynin (DITROPAN) tablet 5 mg, 5 mg, Oral, Daily, Pahwani, Ravi, MD, 5 mg at 12/19/21 0821   pantoprazole (PROTONIX) EC tablet 40 mg, 40 mg, Oral, Daily, Pahwani, Ravi, MD, 40 mg at 12/19/21 0820   polyethylene glycol (MIRALAX / GLYCOLAX) packet 17 g, 17 g, Oral, Daily PRN, Hall, Carole N, DO   prochlorperazine (COMPAZINE) injection 5 mg, 5 mg, Intravenous, Q6H PRN, Hall, Carole N, DO, 5 mg at 12/15/21 2222   rosuvastatin (CRESTOR) tablet 20 mg, 20 mg, Oral, Daily, Pahwani, Ravi, MD, 20 mg at 12/19/21 0820   senna-docusate (Senokot-S) tablet 1 tablet, 1 tablet, Oral, QHS PRN, Katrinka Blazing, Rondell A, MD   sertraline (ZOLOFT) tablet 100 mg, 100 mg, Oral, QHS, Pahwani, Ravi, MD, 100 mg at 12/18/21 2103   Patients Current Diet:  Diet Order                  Diet Heart Room service appropriate? Yes; Fluid consistency: Thin  Diet effective now                         Precautions / Restrictions Precautions Precautions: Fall Precaution  Comments: dizziness Restrictions Weight Bearing Restrictions: No    Has the patient had 2 or more falls or a fall with injury in the past year? Yes   Prior Activity Level Community (5-7x/wk): Pt. active in the community PTA   Prior Functional Level Self Care: Did the patient need help bathing, dressing, using the toilet or eating? Independent   Indoor Mobility: Did the patient need assistance with walking from room to room (with or without device)? Independent   Stairs: Did the patient need assistance with internal  or external stairs (with or without device)? Independent   Functional Cognition: Did the patient need help planning regular tasks such as shopping or remembering to take medications? Independent   Patient Information Are you of Hispanic, Latino/a,or Spanish origin?: A. No, not of Hispanic, Latino/a, or Spanish origin What is your race?: A. White Do you need or want an interpreter to communicate with a doctor or health care staff?: 0. No   Patient's Response To:  Health Literacy and Transportation Is the patient able to respond to health literacy and transportation needs?: Yes Health Literacy - How often do you need to have someone help you when you read instructions, pamphlets, or other written material from your doctor or pharmacy?: Never In the past 12 months, has lack of transportation kept you from medical appointments or from getting medications?: No In the past 12 months, has lack of transportation kept you from meetings, work, or from getting things needed for daily living?: No   Home Assistive Devices / Equipment Home Equipment: Agricultural consultant (2 wheels), [employer] - single point   Prior Device Use: Indicate devices/aids used by the patient prior to current illness, exacerbation or injury?  spc   Current Functional Level Cognition   Overall Cognitive Status: Within Functional Limits for tasks assessed Orientation Level: Oriented X4 General Comments: but not  formally assessed, a little distracted by headache pain and dizziness/nausea    Extremity Assessment (includes Sensation/Coordination)   Upper Extremity Assessment: Overall WFL for tasks assessed  Lower Extremity Assessment: Defer to PT evaluation     ADLs   Overall ADL's : Needs assistance/impaired Eating/Feeding: Set up, Sitting Eating/Feeding Details (indicate cue type and reason): Set-up A to open packages Grooming: Wash/dry hands, Wash/dry face, Oral care, Moderate assistance, Standing Grooming Details (indicate cue type and reason): Pt stood at sink but was very tremulous with all movement and was betting holding to sink with one hand. Pt did not tolerate standing long before asking to sit. Upper Body Bathing: Minimal assistance, Sitting Lower Body Bathing: Moderate assistance, Sit to/from stand Lower Body Bathing Details (indicate cue type and reason): Mod A in sitting/standing secondary to balance deficits and generalized weakness. Upper Body Dressing : Minimal assistance, Sitting Lower Body Dressing: Moderate assistance, Sit to/from stand Lower Body Dressing Details (indicate cue type and reason): Pt required mod assist to start pants over feet and mod assist to stand. Pt has not been up most of weekend therefore unable to let go of walker to pull pants up without mod assist.  Pt with knee pain after being in bed all day yesterday. Deferred donning socks due to knee pain. Toilet Transfer: Moderate assistance, Rolling walker (2 wheels), Stand-pivot, BSC/3in1 Toilet Transfer Details (indicate cue type and reason): Pt weak from being in bed over weekend and declined walking to bathroom. Pt transferred to [facility] with min assist. Pt with small steps and wanting to sit. Toileting- Clothing Manipulation and Hygiene: Minimal assistance, Sit to/from stand Toileting - Clothing Manipulation Details (indicate cue type and reason): mod assist for hygiene in standing due to not being able to let go of  walker due to unsteadiness. Functional mobility during ADLs: Moderate assistance General ADL Comments: Pt limited with adls due to poor balance, anxiety and weakness.     Mobility   Overal bed mobility: Needs Assistance Bed Mobility: Supine to Sit Supine to sit: Mod assist, HOB elevated General bed mobility comments: up in recliner     Transfers   Overall transfer level: Needs  assistance Equipment used: Rolling walker (2 wheels) Transfers: Sit to/from Stand, Bed to chair/wheelchair/BSC Sit to Stand: Mod assist Bed to/from chair/wheelchair/BSC transfer type:: Step pivot Stand pivot transfers: Mod assist General transfer comment: Pt reporting she could not walk due to pain and shakiness (pt visibly trembling in standing). Assisted to [facility] with pt with very small steps and at times shimmying her feet in order to pivot and get close enought to [facility] to sit down; transfer BSC to chair with better ability to step and clear her feet, but still limited by bil knee and ankle pain     Ambulation / Gait / Stairs / Wheelchair Mobility   Ambulation/Gait Ambulation/Gait assistance: Mod assist Gait Distance (Feet): 25 Feet Assistive device: Rolling walker (2 wheels) Gait Pattern/deviations: Decreased stride length, Trunk flexed, Wide base of support, Step-to pattern General Gait Details: pt unsafe to ambulate with +1 assist due to bil LE pain and trembling/shaking Gait velocity: significantly decrease Gait velocity interpretation: <1.31 ft/sec, indicative of household ambulator     Posture / Balance Dynamic Sitting Balance Sitting balance - Comments: on EOB with S for balance, no LOB Balance Overall balance assessment: Needs assistance Sitting-balance support: Feet supported Sitting balance-Leahy Scale: Fair Sitting balance - Comments: on EOB with S for balance, no LOB Standing balance support: Bilateral upper extremity supported Standing balance-Leahy Scale: Poor Standing balance comment: UE  support and min A for balance     Special needs/care consideration Skin intact; Pt. HoH    Previous Home Environment (from acute therapy documentation) Living Arrangements: Alone  Lives With: Alone Available Help at Discharge: Family, Available 24 hours/day (states neice or step daughter could stay) Type of Home: House Home Layout: One level Home Access: Stairs to enter Entrance Stairs-Rails: Left, Right Entrance Stairs-Number of Steps: 3 Bathroom Shower/Tub: Health visitor: Standard Bathroom Accessibility: Yes How Accessible: Accessible via wheelchair, Accessible via walker   Discharge Living Setting Plans for Discharge Living Setting: Patient's home Type of Home at Discharge: House Discharge Home Layout: One level Discharge Home Access: Stairs to enter Entrance Stairs-Rails: Left, Right Entrance Stairs-Number of Steps: 3 Discharge Bathroom Shower/Tub: Walk-in shower Discharge Bathroom Toilet: Standard Discharge Bathroom Accessibility: No   Social/Family/Support Systems Patient Roles: Other (Comment) Contact Information: [phone] Anticipated Caregiver: Arline Asp Jones(neice and other family to rotate) Anticipated Caregiver's Contact Information: [phone] Ability/Limitations of Caregiver: Min A Caregiver Availability: 24/7 Discharge Plan Discussed with Primary Caregiver: Yes Is Caregiver In Agreement with Plan?: Yes Does Caregiver/Family have Issues with Lodging/Transportation while Pt is in Rehab?: No   Goals Patient/Family Goal for Rehab: PT/OT Supervision Expected length of stay: 12-14 days Pt/Family Agrees to Admission and willing to participate: Yes Program Orientation Provided & Reviewed with Pt/Caregiver Including Roles  & Responsibilities: Yes   Decrease burden of Care through IP rehab admission: none    Possible need for SNF placement upon discharge: not anticipated    Patient Condition: I have reviewed medical records from [facility] , spoken with CM, and patient. I met with patient at the bedside and discussed via phone for inpatient rehabilitation assessment.  Patient will benefit from ongoing PT and OT, can actively participate in 3 hours of therapy a day 5 days of the week, and can make measurable gains during the admission.  Patient will also benefit from the coordinated team approach during an Inpatient Acute Rehabilitation admission.  The patient will receive intensive therapy as well as Rehabilitation physician, nursing, social worker, and care management interventions.  Due to safety, skin/wound care, disease management, medication administration, pain management, and patient education the patient requires 24 hour a day rehabilitation nursing.  The patient is currently Mod A with mobility and basic ADLs.  Discharge setting and therapy post discharge at home with home health is anticipated.  Patient has agreed to participate in the Acute Inpatient Rehabilitation Program and will admit today.   Preadmission Screen Completed By:  Jeronimo Greaves, 12/19/2021 8:22 AM ______________________________________________________________________   Discussed status with Dr. Carlis Abbott  on 12/20/21 at 930 and received approval for admission today.   Admission Coordinator:  Jeronimo Greaves, CCC-SLP, time 1015/Date 12/20/21    Assessment/Plan: Diagnosis: CVA Does the need for close, 24 hr/day Medical supervision in concert with the patient's rehab needs make it unreasonable for this patient to be served in a less intensive setting? Yes Co-Morbidities requiring supervision/potential complications: arthritis, overweight, atrial fibrillation, carotid artery occlusion, colitis Due to bladder management, bowel management, safety, skin/wound care, disease management, medication administration, pain management, and patient education, does the patient require 24 hr/day rehab nursing? Yes Does the patient require coordinated care of a  physician, rehab nurse, PT, OT, and SLP to address physical and functional deficits in the context of the above medical diagnosis(es)? Yes Addressing deficits in the following areas: balance, endurance, locomotion, strength, transferring, bowel/bladder control, bathing, dressing, feeding, grooming, toileting, and cognition Can the patient actively participate in an intensive therapy program of at least 3 hrs of therapy 5 days a week? Yes The potential for patient to make measurable gains while on inpatient rehab is excellent Anticipated functional outcomes upon discharge from inpatient rehab: supervision PT, supervision OT, supervision SLP Estimated rehab length of stay to reach the above functional goals is: 12-16 days Anticipated discharge destination: Home 10. Overall Rehab/Functional Prognosis: good     MD Signature: Sula Soda, MD

## 2021-12-21 ENCOUNTER — Inpatient Hospital Stay (HOSPITAL_COMMUNITY): Payer: Medicare (Managed Care)

## 2021-12-21 DIAGNOSIS — I639 Cerebral infarction, unspecified: Secondary | ICD-10-CM | POA: Diagnosis not present

## 2021-12-21 LAB — COMPREHENSIVE METABOLIC PANEL
ALT: 61 U/L — ABNORMAL HIGH (ref 0–44)
AST: 87 U/L — ABNORMAL HIGH (ref 15–41)
Albumin: 2.6 g/dL — ABNORMAL LOW (ref 3.5–5.0)
Alkaline Phosphatase: 64 U/L (ref 38–126)
Anion gap: 12 (ref 5–15)
BUN: 22 mg/dL (ref 8–23)
CO2: 25 mmol/L (ref 22–32)
Calcium: 9.4 mg/dL (ref 8.9–10.3)
Chloride: 98 mmol/L (ref 98–111)
Creatinine, Ser: 1.04 mg/dL — ABNORMAL HIGH (ref 0.44–1.00)
GFR, Estimated: 53 mL/min — ABNORMAL LOW (ref >60)
Glucose, Bld: 110 mg/dL — ABNORMAL HIGH (ref 70–99)
Potassium: 4.7 mmol/L (ref 3.5–5.1)
Sodium: 135 mmol/L (ref 135–145)
Total Bilirubin: 0.5 mg/dL (ref 0.3–1.2)
Total Protein: 6.6 g/dL (ref 6.5–8.1)

## 2021-12-21 LAB — CBC WITH DIFFERENTIAL/PLATELET
Abs Immature Granulocytes: 0.05 10*3/uL (ref 0.00–0.07)
Basophils Absolute: 0 10*3/uL (ref 0.0–0.1)
Basophils Relative: 0 %
Eosinophils Absolute: 0.2 10*3/uL (ref 0.0–0.5)
Eosinophils Relative: 2 %
HCT: 37.1 % (ref 36.0–46.0)
Hemoglobin: 12.3 g/dL (ref 12.0–15.0)
Immature Granulocytes: 1 %
Lymphocytes Relative: 15 %
Lymphs Abs: 1.3 10*3/uL (ref 0.7–4.0)
MCH: 28.3 pg (ref 26.0–34.0)
MCHC: 33.2 g/dL (ref 30.0–36.0)
MCV: 85.3 fL (ref 80.0–100.0)
Monocytes Absolute: 1.2 10*3/uL — ABNORMAL HIGH (ref 0.1–1.0)
Monocytes Relative: 13 %
Neutro Abs: 6.1 10*3/uL (ref 1.7–7.7)
Neutrophils Relative %: 69 %
Platelets: 282 10*3/uL (ref 150–400)
RBC: 4.35 MIL/uL (ref 3.87–5.11)
RDW: 13.9 % (ref 11.5–15.5)
WBC: 8.9 10*3/uL (ref 4.0–10.5)
nRBC: 0 % (ref 0.0–0.2)

## 2021-12-21 MED ORDER — MAGNESIUM HYDROXIDE 400 MG/5ML PO SUSP
30.0000 mL | Freq: Once | ORAL | Status: AC
  Administered 2021-12-21: 30 mL via ORAL
  Filled 2021-12-21: qty 30

## 2021-12-21 MED ORDER — CEPHALEXIN 250 MG PO CAPS
500.0000 mg | ORAL_CAPSULE | Freq: Three times a day (TID) | ORAL | Status: DC
  Administered 2021-12-21 – 2021-12-27 (×18): 500 mg via ORAL
  Filled 2021-12-21 (×18): qty 2

## 2021-12-21 NOTE — Progress Notes (Signed)
Inpatient Rehabilitation Care Coordinator Assessment and Plan Patient Details  Name: [name] MRN: [id number] Date of Birth: 10/02/1934  Today's Date: 12/21/2021  Hospital Problems: Principal Problem:   Cerebellar stroke [facility])  Past Medical History:  Past Medical History:  Diagnosis Date   Arthritis    Atrial fibrillation (HCC)    Carotid artery occlusion    Colitis    COPD (chronic obstructive pulmonary disease) (HCC)    Diverticulitis    Hypertension    Keratosis, seborrheic    Lumbar spondylosis    w/impingement L3-S1   Past Surgical History:  Past Surgical History:  Procedure Laterality Date   ABDOMINAL HYSTERECTOMY     BREAST EXCISIONAL BIOPSY Left    BREAST EXCISIONAL BIOPSY Left    BREAST EXCISIONAL BIOPSY Right    BREAST SURGERY     cyst removal   CAROTID ENDARTERECTOMY Left 2010   CHOLECYSTECTOMY     EYE SURGERY Bilateral 06/2014   cataracts   JOINT REPLACEMENT     RIGHT KNEE   REPLACEMENT TOTAL KNEE Right    Social History:  reports that she quit smoking about 19 years ago. Her smoking use included cigarettes. She has a 75.00 pack-year smoking history. She has never used smokeless tobacco. She reports that she does not drink alcohol and does not use drugs.  Family / Support Systems Children: [name], niece and [name], niece Anticipated Caregiver: cindy and other famil members Ability/Limitations of Caregiver: Min A Caregiver Availability: 24/7 Family Dynamics: support form nieces  Social History Preferred language: English Religion: Cabin crew - How often do you need to have someone help you when you read instructions, pamphlets, or other written material from your doctor or pharmacy?: Never Writes: Yes Employment Status: Retired   Abuse/Neglect Abuse/Neglect Assessment Can Be Completed: Yes Physical Abuse: Denies Verbal Abuse: Denies Sexual Abuse: Denies Exploitation of patient/patient's resources: Denies Self-Neglect:  Denies  Patient response to: Social Isolation - How often do you feel lonely or isolated from those around you?: Never  Emotional Status Recent Psychosocial Issues: coping Psychiatric History: n/a Substance Abuse History: n/a  Patient / Family Perceptions, Expectations & Goals Pt/Family understanding of illness & functional limitations: tes Premorbid pt/family roles/activities: Patient previously independent Anticipated changes in roles/activities/participation: anticipating supervision goals. Family able to provide some assistance, more supervision Pt/family expectations/goals: supervision  [place] Agencies: None Premorbid Home Care/DME Agencies: Other (Comment) ([employer], [facility]) Is the patient able to respond to transportation needs?: Yes In the past 12 months, has lack of transportation kept you from medical appointments or from getting medications?: No In the past 12 months, has lack of transportation kept you from meetings, work, or from getting things needed for daily living?: No Resource referrals recommended:  (coping)  Discharge Planning Living Arrangements: [place]: Other relatives Type of Residence: Private residence Insurance Resources: Multimedia programmer (specify) ([place]) Financial Screen Referred: No Living Expenses: Own Money Management: Patient Does the patient have any problems obtaining your medications?: No Home Management: Independent Patient/Family Preliminary Plans: Nieces able to assist with cogntive tasks Care Coordinator Barriers to Discharge: Lack of/limited family support, Decreased caregiver support, Insurance for SNF coverage Care Coordinator Anticipated Follow Up Needs: HH/OP Expected length of stay: 12-14 Days  Clinical Impression SW met with patient and called niece at bedside introduced self and explained role. Patient will discharge home with assistance from her 2 nieces [name] and [name]) and her  daughter, [name]. Family able to provide assistance to patient and discharge  and family will rotate 24/7 supervision. No additional questions or concerns.  Dyanne Iha 12/21/2021, 12:25 PM

## 2021-12-21 NOTE — Progress Notes (Signed)
PROGRESS NOTE   Subjective/Complaints:  Pt talking to herself but is alert and oriented to person and place  C/o constipation and "bladder problems" Has occ diarrhea at home " keeps immodium on hand" Also with back pain as well as bilateral knee and plantar foot pain which started after sitting "too long in chair" ROS- neg CP, SOB, N/V Objective:   DG Lumbar Spine 2-3 Views  Result Date: 12/20/2021 CLINICAL DATA:  Back pain for the past several days.  No known fall. EXAM: LUMBAR SPINE - 2-3 VIEW COMPARISON:  Lumbar spine CT 03/26/2018 FINDINGS: There are 5 non-rib-bearing lumbar-type vertebral bodies. Mild scoliotic curvature of the thoracolumbar spine with dominant cortical morning convex to the left measuring approximately 14 degrees (has been from the inferior endplate of T11 to the inferior endplate of L3). No anterolisthesis or retrolisthesis. Severe (greater than 75%) compression deformity involving the T12 vertebral body. Remaining lumbar vertebral body heights appear preserved. Moderate to severe multilevel lumbar spine DDD, worse at L4-L5 and L5-S1 with disc space height loss, endplate irregularity and sclerosis. Post cholecystectomy. Atherosclerotic plaque within the abdominal aorta. IMPRESSION: 1. Age-indeterminate severe (greater than 75%) compression deformity of the T12 vertebral body. Correlation point tenderness at this location is advised. 2. Moderate to severe multilevel lumbar spine DDD, worse at L4-L5 and L5-S1 Electronically Signed   By: Simonne Come M.D.   On: 12/20/2021 18:59   DG Abd 1 View  Result Date: 12/20/2021 CLINICAL DATA:  Nausea and vomiting. EXAM: ABDOMEN - 1 VIEW COMPARISON:  12/14/2021 FINDINGS: Moderate colonic stool burden without evidence of enteric obstruction. Nondiagnostic evaluation for pneumoperitoneum. No definite pneumatosis or portal venous gas. Post cholecystectomy. Degenerative change of the  lumbar spine and bilateral hips is suspected though incompletely evaluated. IMPRESSION: Moderate colonic stool burden without evidence of enteric obstruction. Electronically Signed   By: Simonne Come M.D.   On: 12/20/2021 18:56   Recent Labs    12/19/21 0819  WBC 10.8*  HGB 13.3  HCT 40.6  PLT 262   Recent Labs    12/19/21 0819 12/20/21 1606  NA 133* 133*  K 3.2* 4.7  CL 97* 96*  CO2 24 23  GLUCOSE 100* 120*  BUN 17 21  CREATININE 0.91 1.04*  CALCIUM 8.8* 9.0   No intake or output data in the 24 hours ending 12/21/21 0740      Physical Exam: Vital Signs Blood pressure (!) 141/62, pulse 62, temperature 97.7 F (36.5 C), temperature source Oral, resp. rate 16, SpO2 95 %.  General: No acute distress Mood and affect are appropriate Heart: Regular rate and rhythm no rubs murmurs or extra sounds Lungs: Clear to auscultation, breathing unlabored, no rales or wheezes Abdomen: Positive bowel sounds, soft nontender to palpation, nondistended Extremities: No clubbing, cyanosis, or edema Skin: No evidence of breakdown, no evidence of rash Neurologic: Cranial nerves II through XII intact, motor strength is 4/5 in bilateral deltoid, bicep, tricep, grip, hip flexor, knee extensors, ankle dorsiflexor and plantar flexor  Cerebellar exam normal finger to nose to finger as well in bilateral upper Musculoskeletal: Full range of motion in all 4 extremities. No joint swelling, pain to palp RIght patellar ,  no foot tenderness  or swelling , no pain with ROM    Assessment/Plan: 1. Functional deficits which require 3+ hours per day of interdisciplinary therapy in a comprehensive inpatient rehab setting. Physiatrist is providing close team supervision and 24 hour management of active medical problems listed below. Physiatrist and rehab team continue to assess barriers to discharge/monitor patient progress toward functional and medical goals  Care Tool:  Bathing              Bathing  assist       Upper Body Dressing/Undressing Upper body dressing        Upper body assist      Lower Body Dressing/Undressing Lower body dressing            Lower body assist       Toileting Toileting    Toileting assist       Transfers Chair/bed transfer  Transfers assist           Locomotion Ambulation   Ambulation assist              Walk 10 feet activity   Assist           Walk 50 feet activity   Assist           Walk 150 feet activity   Assist           Walk 10 feet on uneven surface  activity   Assist           Wheelchair     Assist               Wheelchair 50 feet with 2 turns activity    Assist            Wheelchair 150 feet activity     Assist          Blood pressure (!) 141/62, pulse 62, temperature 97.7 F (36.5 C), temperature source Oral, resp. rate 16, SpO2 95 %.  Medical Problem List and Plan: 1. Functional deficits secondary to small right cerebellar stroke.              -patient may shower             -ELOS/Goals: 12-16 days S             -Admit to CIR 2.  Antithrombotics: -DVT/anticoagulation:  Pharmaceutical: Eliquis             -antiplatelet therapy: N/a 3. BLE neuropathy: will add low dose gabapentin Also with chronic low back pain , had injections in past in [place] which were "quite helpful" Xray lumbar sow severe DDD, spondylosis , LE pain may be related to Lumbar stenosis  4. Insomnia: continue melatonin and gabapentin 5. Neuropsych/cognition: This patient is capable of making decisions on her own behalf. 6. Skin/Wound Care: Routine pressure relief measures.  7. Fluids/Electrolytes/Nutrition: Monitor I/O. Check CMET in am. 8. A fib: Monitor HR TID--continue tenormin and Eliquis.  9. HTN: Monitor BP TID- continue Amlodipine, Cozaar and tenormin.  10 AKI- has resolved. Recheck BUN/SCr in am.  11. Bilateral hydronephrosis on KUB: Will repeat KUB. Monitor  voiding with PVR/bladder scans.              --d/c ditropan.              --Neurogenic bladder? As has hx of lumbar spondylosis with impingement and recent fall PVR pending F/u KUB shows bladder decompressed , kidney fxn normal , prior KUB has  evidence of residual contrast showing dilatation of renal calyces, check kidney US 12. Constipation: Will add miralax 13. Recurrent hypokalemia: Supplemented X 1 today.     Latest Ref Rng & Units 12/20/2021    4:06 PM 12/19/2021    8:19 AM 12/17/2021    3:39 AM  BMP  Glucose 70 - 99 mg/dL 706  237  628   BUN 8 - 23 mg/dL 21  17  13    Creatinine 0.44 - 1.00 mg/dL  3.15  1.76   Sodium 135 - 145 mmol/L 133  133  136   Potassium 3.5 - 5.1 mmol/L 4.7  3.2  3.6   Chloride 98 - 111 mmol/L 96  97  99   CO2 22 - 32 mmol/L 23  24  25    Calcium 8.9 - 10.3 mg/dL 9.0  8.8  8.9   K+ wnl 11/30  14. H/o Depression/anxiety: continue Zoloft and ativan prn 15. Neuropathy: Will order lumbar films w/recent fall.     LOS: 1 days A FACE TO FACE EVALUATION WAS PERFORMED  12/21/2021, 7:40 AM

## 2021-12-21 NOTE — Progress Notes (Signed)
[facility] Individual Statement of Services  Patient Name:  [name]  Date:  12/21/2021  Welcome to the [facility].  Our goal is to provide you with an individualized program based on your diagnosis and situation, designed to meet your specific needs.  With this comprehensive rehabilitation program, you will be expected to participate in at least 3 hours of rehabilitation therapies Monday-Friday, with modified therapy programming on the weekends.  Your rehabilitation program will include the following services:  Physical Therapy (PT), Occupational Therapy (OT), Speech Therapy (ST), 24 hour per day rehabilitation nursing, Therapeutic Recreaction (TR), Neuropsychology, Care Coordinator, Rehabilitation Medicine, Nutrition Services, Pharmacy Services, and Other  Weekly team conferences will be held on Wednesdays to discuss your progress.  Your Inpatient Rehabilitation Care Coordinator will talk with you frequently to get your input and to update you on team discussions.  Team conferences with you and your family in attendance may also be held.  Expected length of stay:  12-14 Days  Overall anticipated outcome:  Supervision  Depending on your progress and recovery, your program may change. Your Inpatient Rehabilitation Care Coordinator will coordinate services and will keep you informed of any changes. Your Inpatient Rehabilitation Care Coordinator's name and contact numbers are listed  below.  The following services may also be recommended but are not provided by the [facility]:   Home Health Rehabiltiation Services Outpatient Rehabilitation Services    Arrangements will be made to provide these services after discharge if needed.  Arrangements include referral to agencies that provide these services.  Your insurance has been verified to be:   [employer] Your primary doctor is:  Blair Heys, MD  Pertinent information  will be shared with your doctor and your insurance company.  Inpatient Rehabilitation Care Coordinator:  Lavera Guise, [place] [phone] or [phone]  Information discussed with and copy given to patient by: Andria Rhein, 12/21/2021, 11:10 AM

## 2021-12-21 NOTE — Plan of Care (Signed)
  Problem: RH Balance Goal: LTG Patient will maintain dynamic standing with ADLs (OT) Description: LTG:  Patient will maintain dynamic standing balance with assist during activities of daily living (OT)  Flowsheets (Taken 12/21/2021 1630) LTG: Pt will maintain dynamic standing balance during ADLs with: Supervision/Verbal cueing   Problem: Sit to Stand Goal: LTG:  Patient will perform sit to stand in prep for activites of daily living with assistance level (OT) Description: LTG:  Patient will perform sit to stand in prep for activites of daily living with assistance level (OT) Flowsheets (Taken 12/21/2021 1630) LTG: PT will perform sit to stand in prep for activites of daily living with assistance level: Supervision/Verbal cueing   Problem: RH Bathing Goal: LTG Patient will bathe all body parts with assist levels (OT) Description: LTG: Patient will bathe all body parts with assist levels (OT) Flowsheets (Taken 12/21/2021 1630) LTG: Pt will perform bathing with assistance level/cueing: Supervision/Verbal cueing   Problem: RH Dressing Goal: LTG Patient will perform upper body dressing (OT) Description: LTG Patient will perform upper body dressing with assist, with/without cues (OT). Flowsheets (Taken 12/21/2021 1630) LTG: Pt will perform upper body dressing with assistance level of: Supervision/Verbal cueing Goal: LTG Patient will perform lower body dressing w/assist (OT) Description: LTG: Patient will perform lower body dressing with assist, with/without cues in positioning using equipment (OT) Flowsheets (Taken 12/21/2021 1630) LTG: Pt will perform lower body dressing with assistance level of: Supervision/Verbal cueing   Problem: RH Toileting Goal: LTG Patient will perform toileting task (3/3 steps) with assistance level (OT) Description: LTG: Patient will perform toileting task (3/3 steps) with assistance level (OT)  Flowsheets (Taken 12/21/2021 1630) LTG: Pt will perform toileting  task (3/3 steps) with assistance level: Supervision/Verbal cueing   Problem: RH Simple Meal Prep Goal: LTG Patient will perform simple meal prep w/assist (OT) Description: LTG: Patient will perform simple meal prep with assistance, with/without cues (OT). Flowsheets (Taken 12/21/2021 1630) LTG: Pt will perform simple meal prep with assistance level of: Supervision/Verbal cueing   Problem: RH Toilet Transfers Goal: LTG Patient will perform toilet transfers w/assist (OT) Description: LTG: Patient will perform toilet transfers with assist, with/without cues using equipment (OT) Flowsheets (Taken 12/21/2021 1630) LTG: Pt will perform toilet transfers with assistance level of: Supervision/Verbal cueing   Problem: RH Tub/Shower Transfers Goal: LTG Patient will perform tub/shower transfers w/assist (OT) Description: LTG: Patient will perform tub/shower transfers with assist, with/without cues using equipment (OT) Flowsheets (Taken 12/21/2021 1630) LTG: Pt will perform tub/shower stall transfers with assistance level of: Supervision/Verbal cueing

## 2021-12-21 NOTE — Evaluation (Signed)
Physical Therapy Assessment and Plan  Patient Details  Name: [name] MRN: [id number] Date of Birth: 1934/05/17  PT Diagnosis: Abnormal posture, Abnormality of gait, Ataxic gait, Coordination disorder, Difficulty walking, Muscle weakness, and Pain in bilateral feet and ankles as well as back Rehab Potential: Good ELOS: ~2 weeks   Today's Date: 12/21/2021 PT Individual Time: 0902-1017 PT Individual Time Calculation (min): 75 min    Hospital Problem: Principal Problem:   Cerebellar stroke [facility])   Past Medical History:  Past Medical History:  Diagnosis Date   Arthritis    Atrial fibrillation (HCC)    Carotid artery occlusion    Colitis    COPD (chronic obstructive pulmonary disease) (HCC)    Diverticulitis    Hypertension    Keratosis, seborrheic    Lumbar spondylosis    w/impingement L3-S1   Past Surgical History:  Past Surgical History:  Procedure Laterality Date   ABDOMINAL HYSTERECTOMY     BREAST EXCISIONAL BIOPSY Left    BREAST EXCISIONAL BIOPSY Left    BREAST EXCISIONAL BIOPSY Right    BREAST SURGERY     cyst removal   CAROTID ENDARTERECTOMY Left 2010   CHOLECYSTECTOMY     EYE SURGERY Bilateral 06/2014   cataracts   JOINT REPLACEMENT     RIGHT KNEE   REPLACEMENT TOTAL KNEE Right     Assessment & Plan Clinical Impression: Patient is a 87 y.o. year old female with history of HTN, PAF (patient had d/c coumadin due to sister's bleed hx), L-CEA, COPD, lumbar spondylosis, SBO who was admitted on 12/14/21 with reports of dizziness and nausea w/emesis and inability to get out of bed. She also reported fall a week PTA and that she struck her head without any significant injuries. She was noted to have A Fib with elevated BP and K-2.8. CTA head/neck was negative for LVO and showed complex plaque L-ICA origin w/60% stenosis and evidence of R-ICA fibromuscular dysplasia. MRI brain showed normal brain for age and no acute abnormality. Dr. Leonel Ramsay felt that patient with  small cerebellar stroke not seen on Xray and likely embolic due to PAF. She was started on eliquis as patient agreeable. 2 D echo showed EF 55-60% with mild aortic valve calcification  w/trivial AVR.  LDL-27 and at goal. Anxiety managed with IV ativan, she was treated with IVF and hypokalemia has resolved with supplementation.    KUB showed moderate to marked and moderate left hydronephrosis with markedly distended bladder concerning for obstruction --foley recommended.    Also reported to have horizontal nystagmus with dizziness with movement due to vestibular dysfunction. On 11/26, she developed  A fib w/RVR --rates up to 170 treated with IV metoprolol with rates back in 70's. Therapy has been working with patient who continues to be limited by anxiety, dizziness, nausea, pain in BLE and feet, shakiness/tremors with standing,  attempts at vestibular rehab as well as anxiety management. CIR recommended due to functional decline. She is very hard of hearing.   Patient transferred to CIR on 12/20/2021 .   Patient currently requires mod assist with mobility secondary to muscle weakness, decreased cardiorespiratoy endurance, impaired timing and sequencing, unbalanced muscle activation, and decreased motor planning, decreased initiation and delayed processing, central origin, and decreased sitting balance, decreased standing balance, decreased postural control, and decreased balance strategies.  Prior to hospitalization, patient was independent  with mobility and lived with Alone in a House home.  Home access is 2Stairs to enter, Other (comment) (pt usually goes in through  garage 2 STE w/o HRs (but pt uses door frame for support) otherwise has front entrance with 3 STE B HRs).  Patient will benefit from skilled PT intervention to maximize safe functional mobility, minimize fall risk, and decrease caregiver burden for planned discharge home with 24 hour supervision.  Anticipate patient will benefit from follow  up [place] at discharge.  PT - End of Session Activity Tolerance: Tolerates 30+ min activity with multiple rests Endurance Deficit: Yes Endurance Deficit Description: decreased activity tolerance during mobility PT Assessment Rehab Potential (ACUTE/IP ONLY): Good PT Barriers to Discharge: Decreased caregiver support;Home environment access/layout;Neurogenic Bowel & Bladder PT Patient demonstrates impairments in the following area(s): Balance;Safety;Sensory;Edema;Endurance;Skin Integrity;Motor;Nutrition;Pain;Perception PT Transfers Functional Problem(s): Bed Mobility;Bed to Chair;Car;Furniture PT Locomotion Functional Problem(s): Ambulation;Stairs PT Plan PT Intensity: Minimum of 1-2 x/day ,45 to 90 minutes PT Frequency: 5 out of 7 days PT Duration Estimated Length of Stay: ~2 weeks PT Treatment/Interventions: Ambulation/gait training;Community reintegration;DME/adaptive equipment instruction;Neuromuscular re-education;Psychosocial support;Stair training;UE/LE Strength taining/ROM;Balance/vestibular training;Discharge planning;Functional electrical stimulation;Pain management;Skin care/wound management;Therapeutic Activities;UE/LE Coordination activities;Cognitive remediation/compensation;Disease management/prevention;Functional mobility training;Patient/family education;Splinting/orthotics;Therapeutic Exercise;Visual/perceptual remediation/compensation PT Transfers Anticipated Outcome(s): supervision using LRAD PT Locomotion Anticipated Outcome(s): supervision using LRAD household PT Recommendation Recommendations for Other Services: Therapeutic Recreation consult Therapeutic Recreation Interventions: Stress management;Outing/community reintergration Follow Up Recommendations: Home health PT;24 hour supervision/assistance Patient destination: Home Equipment Recommended: To be determined   PT Evaluation Precautions/Restrictions Precautions Precautions: Fall;Other (comment) Precaution  Comments: dizziness, HOH (hears better in L ear) Restrictions Weight Bearing Restrictions: No Pain Pain Assessment Pain Scale: 0-10 Pain Type: Acute pain Pain Location: Bladder Pain Descriptors / Indicators: Pressure Pain Intervention(s): Rest;Other (Comment) (toileted) Pt states she has been having pain in her feet but that it is improving each day Pain Interference Pain Interference Pain Effect on Sleep: 1. Rarely or not at all Pain Interference with Therapy Activities: 4. Almost constantly (pt states the pain in her feet satarted days ago while in hospital after prolonged sitting in recliner with associated back pain) Pain Interference with Day-to-Day Activities: 4. Almost constantly Home Living/Prior Functioning Home Living Available Help at Discharge: Family;Available 24 hours/day (2 nieces (one is retired) and a step-daughter reporting to pt that they can provide 24hr support) Type of Home: House Home Access: Stairs to enter;Other (comment) (pt usually goes in through garage 2 STE w/o HRs (but pt uses door frame for support) otherwise has front entrance with 3 STE B HRs) Entrance Stairs-Number of Steps: 2 Entrance Stairs-Rails:  (on front door) Home Layout: One level  Lives With: Alone Prior Function Level of Independence: Independent with gait;Independent with transfers;Independent with homemaking with ambulation;Other (comment) (used cane occasionally in home but always in community; indep w/ laundry but didn't enjoy cooking or cleaning)  Able to Take Stairs?: Yes Driving: Yes Vocation: Retired Biomedical scientist: Web designer from [place] to See in Adequate Light: 1 Impaired Vision - Presenter, broadcasting: Impaired Praxis Praxis: Impaired Praxis Impairment Details: Motor planning (motor planning breaks down during transfers when pt becomes nervous with balance)  Cognition  Overall Cognitive  Status: Within Functional Limits for tasks assessed Arousal/Alertness: Awake/alert Orientation Level: Oriented X4 Year: 2023 Month: November Day of Week: Incorrect (thought it was Friday) Attention: Focused;Sustained Focused Attention: Appears intact Sustained Attention: Appears intact Safety/Judgment: Appears intact Sensation Sensation Light Touch: Appears Intact Hot/Cold: Not tested Proprioception: Impaired by gross assessment (pt with difficulty being aware of where LEs are when transferring) Stereognosis: Not tested Coordination Gross Motor Movements are Fluid and Coordinated:  No Coordination and Movement Description: impaired due to imbalance and dizzines Motor  Motor Motor: Ataxia;Abnormal postural alignment and control;Other (comment) Motor - Skilled Clinical Observations: ataxia and tremors in B LEs   Trunk/Postural Assessment  Cervical Assessment Cervical Assessment: Exceptions to [facility] (very limited L cervical rotation ROM, moderately limited rotation towards R) Thoracic Assessment Thoracic Assessment: Exceptions to [facility] (rounded shoulders with moderate thoracic kyphosis and L lateral lean bias) Lumbar Assessment Lumbar Assessment: Exceptions to [facility] (posterior pelvic tilt) Postural Control Postural Control: Deficits on evaluation Trunk Control: excessive thoracic kyphosis and forward flexion Righting Reactions: delayed and inadequate Protective Responses: delayed and inadequate  Balance Balance Balance Assessed: Yes Static Sitting Balance Static Sitting - Balance Support: Feet supported;Bilateral upper extremity supported Static Sitting - Level of Assistance: 5: Stand by assistance Dynamic Sitting Balance Dynamic Sitting - Balance Support: Feet supported;Bilateral upper extremity supported Dynamic Sitting - Level of Assistance: 4: Min assist Dynamic Sitting - Balance Activities: Other (comment) (BADLs) Static Standing Balance Static Standing - Balance Support:  During functional activity;Bilateral upper extremity supported Static Standing - Level of Assistance: 4: Min assist Dynamic Standing Balance Dynamic Standing - Balance Support: During functional activity;Bilateral upper extremity supported Dynamic Standing - Level of Assistance: 3: Mod assist Extremity Assessment      RLE Assessment RLE Assessment: Exceptions to [facility] Active Range of Motion (AROM) Comments: WFL General Strength Comments: grossly assessed functionally with pt demonstrating at least 3+/5 to 4-/5 throughout LLE Assessment Active Range of Motion (AROM) Comments: WFL General Strength Comments: grossly assessed functionally with pt demonstrating at least 3+/5 to 4-/5 throughout  [place] left and right activity   Roll left and right assist level: Contact Guard/Touching assist    Sit to lying activity   Sit to lying assist level: Minimal Assistance - Patient > 75%    Lying to sitting on side of bed activity   Lying to sitting on side of bed assist level: the ability to move from lying on the back to sitting on the side of the bed with no back support.: Minimal Assistance - Patient > 75%     Care Tool Transfers Sit to stand transfer   Sit to stand assist level: Minimal Assistance - Patient > 75%    Chair/bed transfer   Chair/bed transfer assist level: Moderate Assistance - Patient 50 - 74%     Psychologist, counselling transfer activity did not occur: Safety/medical concerns        Care Tool Locomotion Ambulation Ambulation activity did not occur: Safety/medical concerns        Walk 10 feet activity Walk 10 feet activity did not occur: Safety/medical concerns       Walk 50 feet with 2 turns activity Walk 50 feet with 2 turns activity did not occur: Safety/medical concerns      Walk 150 feet activity Walk 150 feet activity did not occur: Safety/medical concerns      Walk 10 feet on uneven surfaces activity Walk  10 feet on uneven surfaces activity did not occur: Safety/medical concerns      Stairs Stair activity did not occur: Safety/medical concerns        Walk up/down 1 step activity Walk up/down 1 step or curb (drop down) activity did not occur: Safety/medical concerns      Walk up/down 4 steps activity Walk up/down 4 steps activity did not occur: Safety/medical concerns  Walk up/down 12 steps activity Walk up/down 12 steps activity did not occur: Safety/medical concerns      Pick up small objects from floor   Pick up small object from the floor assist level: Dependent - Patient 0%    Wheelchair Is the patient using a wheelchair?: Yes Type of Wheelchair: Manual   Wheelchair assist level: Dependent - Patient 0%    Wheel 50 feet with 2 turns activity   Assist Level: Dependent - Patient 0%  Wheel 150 feet activity   Assist Level: Dependent - Patient 0%    Refer to Care Plan for Long Term Goals  SHORT TERM GOAL WEEK 1 PT Short Term Goal 1 (Week 1): Pt will perform supine<>sit with supervision PT Short Term Goal 2 (Week 1): Pt will perform sit<>stands using LRAD with CGA PT Short Term Goal 3 (Week 1): Pt will perform bed<>chair transfers using LRAD with CGA PT Short Term Goal 4 (Week 1): Pt will ambulate at least 61f using LRAD with CGA PT Short Term Goal 5 (Week 1): Pt will navigate 2 stairs in preparation for home entry with CGA  Recommendations for other services: Therapeutic Recreation  Stress management and Outing/community reintegration  Skilled Therapeutic Intervention Pt received supine in bed and agreeable to therapy session. Evaluation completed (see details above) with patient education regarding purpose of PT evaluation, PT POC and goals, therapy schedule, weekly team meetings, and other CIR information including safety plan and fall risk safety. Pt performed the below functional mobility tasks with the specified levels of skilled cuing and assistance. Pt reporting  significant pressure pain in lower abdomen/pelvic region - nurse notified to perform bladder scan - pt requires increased time to continently void all of the urine on BSC. At end of session, pt left seated in w/c with needs in reach and seat belt alarm on.  Mobility Bed Mobility Bed Mobility: Supine to Sit;Sit to Supine Supine to Sit: Minimal Assistance - Patient > 75% Sit to Supine: Minimal Assistance - Patient > 75% Transfers Transfers: Sit to Stand;Stand to Sit;Stand Pivot Transfers Sit to Stand: Minimal Assistance - Patient > 75%;Moderate Assistance - Patient 50-74% Stand to Sit: Minimal Assistance - Patient > 75%;Moderate Assistance - Patient 50-74% Stand Pivot Transfers: Moderate Assistance - Patient 50 - 74% Stand Pivot Transfer Details: Verbal cues for precautions/safety;Verbal cues for sequencing;Verbal cues for technique;Manual facilitation for weight shifting;Manual facilitation for placement;Tactile cues for sequencing;Tactile cues for posture;Tactile cues for weight shifting;Tactile cues for initiation;Tactile cues for placement Squat Pivot Transfers: Minimal Assistance - Patient > 75% Transfer (Assistive device): 1 person hand held assist Locomotion  Gait Ambulation: No (unable due to fatigue and toileting) Stairs / Additional Locomotion Stairs: No Wheelchair Mobility Wheelchair Mobility: No   Discharge Criteria: Patient will be discharged from PT if patient refuses treatment 3 consecutive times without medical reason, if treatment goals not met, if there is a change in medical status, if patient makes no progress towards goals or if patient is discharged from hospital.  The above assessment, treatment plan, treatment alternatives and goals were discussed and mutually agreed upon: by patient  CTawana Scale, PT, DPT, NCS, CSRS 12/21/2021, 9:57 AM

## 2021-12-21 NOTE — Progress Notes (Signed)
Inpatient Rehabilitation  Patient information reviewed and entered into eRehab system by Jr Milliron M. Eisa Necaise, M.A., CCC/SLP, PPS Coordinator.  Information including medical coding, functional ability and quality indicators will be reviewed and updated through discharge.    

## 2021-12-21 NOTE — Discharge Summary (Signed)
Physician Discharge Summary   Patient: [name] MRN: [id number] DOB: 09/10/1934  Admit date:     12/14/2021  Discharge date: 12/20/2021  Discharge Physician: Alberteen Sam   PCP: Blair Heys, MD     Recommendations at discharge:  Follow up with CIR at discharge due to stroke After discharge from inpatient rehab, follow up with PCP Dr. Manus Gunning in 1 week Follow up with Neurology in 6-8 weeks     Discharge Diagnoses: Principal Problem:   Acute cerebellar CVA (cerebral vascular accident) Active Problems:   Nausea and vomiting   Paroxysmal atrial fibrillation (HCC)   Hypokalemia   Essential hypertension   Anxiety   Carotid artery disease (HCC)   Hyperlipidemia     Hospital Course: [name] is an 87 y.o. F with HTN, pAF not on AC, PVD s/p L CEA, dCHF and COPD not on home O2 who presented with acute onset dizziness.    Acute cerebellar stroke CT head and CTA head and neck unremarkable on admission.  Neurology were consulted and reviewed MRI and felt that patient had evidence of a small RIGHT cerebellar infarct.  Echocardiogram showed no cardiogenic source of embolism. Carotid imaging unremarkable.    Lipids ordered, discharged on Crestor.    Aspirin ordered initially, then changed to Eliquis.     Known Afib.  tPA not given because symptoms too mild. Dysphagia screen ordered.  PT eval ordered: recommended inpatient rehab. Nonsmoker.      Paroxysmal atrial fibrillation Patient appears to be in atrial fibrillation and has been rate controlled with atenolol.  She had previously been on Coumadin, but stopped taking the medication after her sister had a significant bleeding event while on the medication.  Patient was just on aspirin.  CHA2DS2-VASc score =6-7.  Family was concerned with her being on blood thinners that she is alone and has frequent falls.  However neurology has started the patient on Eliquis after discussing with the family.    Leukocytosis Nonspecific, no signs of infection.               The [facility] Controlled Substances Registry was reviewed for this patient prior to discharge.  Consultants: Neurology   Disposition: Rehabilitation facility   DISCHARGE MEDICATION: Allergies as of 12/20/2021       Reactions   Amoxicillin Nausea Only, Rash   Has patient had a PCN reaction causing immediate rash, facial/tongue/throat swelling, SOB or lightheadedness with hypotension: Yes Has patient had a PCN reaction causing severe rash involving mucus membranes or skin necrosis: No Has patient had a PCN reaction that required hospitalization No Has patient had a PCN reaction occurring within the last 10 years: No If all of the above answers are "NO", then may proceed with Cephalosporin use.   Tetracyclines & Related Nausea Only, Rash        Medication List     STOP taking these medications    aspirin EC 81 MG tablet       TAKE these medications    acetaminophen 500 MG tablet Commonly known as: TYLENOL Take 1,000 mg by mouth every 6 (six) hours as needed for mild pain, moderate pain, fever or headache.   acetaminophen 650 MG CR tablet Commonly known as: TYLENOL Take 650-1,300 mg by mouth every 8 (eight) hours as needed for pain.   amLODipine 10 MG tablet Commonly known as: NORVASC Take 1 tablet (10 mg total) by mouth daily.   apixaban 5 MG Tabs tablet Commonly known as: ELIQUIS  Take 1 tablet (5 mg total) by mouth 2 (two) times daily.   atenolol 25 MG tablet Commonly known as: TENORMIN Take 0.5 tablets (12.5 mg total) by mouth daily with breakfast. What changed: how much to take   diclofenac Sodium 1 % Gel Commonly known as: Voltaren Apply 2 g topically 4 (four) times daily. What changed:  when to take this reasons to take this   LORazepam 1 MG tablet Commonly known as: ATIVAN Take 0.5-1 mg by mouth 2 (two) times daily.   losartan 100 MG tablet Commonly known as:  COZAAR Take 100 mg by mouth daily with breakfast.   MULTIVITAMIN PO Take 1 tablet by mouth daily.   omeprazole 40 MG capsule Commonly known as: PRILOSEC Take 40 mg by mouth daily.   oxybutynin 5 MG tablet Commonly known as: DITROPAN Take 1 tablet by mouth every morning.   rosuvastatin 20 MG tablet Commonly known as: CRESTOR TAKE 1 TABLET BY MOUTH  DAILY   sertraline 100 MG tablet Commonly known as: ZOLOFT Take 100 mg by mouth at bedtime.   sodium chloride 0.65 % Soln nasal spray Commonly known as: OCEAN Place 1 spray into both nostrils 2 (two) times daily.        Follow-up Information     Blair HeysEhinger, Robert, MD Follow up.   Specialty: Family Medicine Contact information: [address] [place] [address] [phone]         [facility] Follow up.   Why: For stroke follow up in 8 weeks Contact information: [address]     [address] [place] [phone]                  Discharge Exam: Ceasar MonsFiled Weights   12/14/21 [id number]  Weight: 69 kg    General: Pt is alert, awake, not in acute distress Cardiovascular: RRR, nl S1-S2, no murmurs appreciated.   No LE edema.   Respiratory: Normal respiratory rate and rhythm.  CTAB without rales or wheezes. Abdominal: Abdomen soft and non-tender.  No distension or HSM.   Neuro/Psych: Strength symmetric in upper and lower extremities.  Judgment and insight appear normal.   Condition at discharge: good  The results of significant diagnostics from this hospitalization (including imaging, microbiology, ancillary and laboratory) are listed below for reference.   Imaging Studies: DG Lumbar Spine 2-3 Views  Result Date: 12/20/2021 CLINICAL DATA:  Back pain for the past several days.  No known fall. EXAM: LUMBAR SPINE - 2-3 VIEW COMPARISON:  Lumbar spine CT 03/26/2018 FINDINGS: There are 5 non-rib-bearing lumbar-type vertebral bodies. Mild scoliotic  curvature of the thoracolumbar spine with dominant cortical morning convex to the left measuring approximately 14 degrees (has been from the inferior endplate of T11 to the inferior endplate of L3). No anterolisthesis or retrolisthesis. Severe (greater than 75%) compression deformity involving the T12 vertebral body. Remaining lumbar vertebral body heights appear preserved. Moderate to severe multilevel lumbar spine DDD, worse at L4-L5 and L5-S1 with disc space height loss, endplate irregularity and sclerosis. Post cholecystectomy. Atherosclerotic plaque within the abdominal aorta. IMPRESSION: 1. Age-indeterminate severe (greater than 75%) compression deformity of the T12 vertebral body. Correlation point tenderness at this location is advised. 2. Moderate to severe multilevel lumbar spine DDD, worse at L4-L5 and L5-S1 Electronically Signed   By: Simonne ComeJohn  Watts M.D.   On: 12/20/2021 18:59   DG Abd 1 View  Result Date: 12/20/2021 CLINICAL DATA:  Nausea and vomiting. EXAM: ABDOMEN -  1 VIEW COMPARISON:  12/14/2021 FINDINGS: Moderate colonic stool burden without evidence of enteric obstruction. Nondiagnostic evaluation for pneumoperitoneum. No definite pneumatosis or portal venous gas. Post cholecystectomy. Degenerative change of the lumbar spine and bilateral hips is suspected though incompletely evaluated. IMPRESSION: Moderate colonic stool burden without evidence of enteric obstruction. Electronically Signed   By: Simonne Come M.D.   On: 12/20/2021 18:56   ECHOCARDIOGRAM COMPLETE  Result Date: 12/15/2021    ECHOCARDIOGRAM REPORT   Patient Name:   JASELYNN TAMAS Date of Exam: 12/15/2021 Medical Rec #:  [id number]   Height:       64.5 in Accession #:    [id number]  Weight:       152.1 lb Date of Birth:  [birth year]   BSA:          1.751 m Patient Age:    87 years    BP:           180/100 mmHg Patient Gender: F           HR:           62 bpm. Exam Location:  Inpatient Procedure: 2D Echo, Color Doppler and Cardiac  Doppler Indications:    Atrial fibrillation  History:        Patient has prior history of Echocardiogram examinations, most                 recent 01/28/2017. CHF, COPD, Arrythmias:Atrial Fibrillation; Risk                 Factors:Hypertension.  Sonographer:    Milda Smart Referring Phys: [id number] RONDELL A SMITH  Sonographer Comments: Technically difficult study due to poor echo windows. Image acquisition challenging due to patient body habitus and Image acquisition challenging due to respiratory motion. IMPRESSIONS  1. Left ventricular ejection fraction, by estimation, is 55 to 60%. The left ventricle has normal function. The left ventricle has no regional wall motion abnormalities. Left ventricular diastolic parameters are consistent with Grade I diastolic dysfunction (impaired relaxation).  2. Right ventricular systolic function is normal. The right ventricular size is normal. There is normal pulmonary artery systolic pressure.  3. The mitral valve is normal in structure. Mild mitral valve regurgitation. No evidence of mitral stenosis.  4. The aortic valve is tricuspid. There is mild calcification of the aortic valve. Aortic valve regurgitation is trivial. No aortic stenosis is present.  5. The inferior vena cava is normal in size with greater than 50% respiratory variability, suggesting right atrial pressure of 3 mmHg. Comparison(s): MR improved from prior reporting. FINDINGS  Left Ventricle: Left ventricular ejection fraction, by estimation, is 55 to 60%. The left ventricle has normal function. The left ventricle has no regional wall motion abnormalities. The left ventricular internal cavity size was normal in size. There is  no left ventricular hypertrophy. Left ventricular diastolic parameters are consistent with Grade I diastolic dysfunction (impaired relaxation). Right Ventricle: The right ventricular size is normal. No increase in right ventricular wall thickness. Right ventricular systolic function is  normal. There is normal pulmonary artery systolic pressure. The tricuspid regurgitant velocity is 2.41 m/s, and  with an assumed right atrial pressure of 3 mmHg, the estimated right ventricular systolic pressure is 26.2 mmHg. Left Atrium: Left atrial size was normal in size. Right Atrium: Right atrial size was normal in size. Pericardium: Trivial pericardial effusion is present. Presence of epicardial fat layer. Mitral Valve: The mitral valve is normal in structure. Mild mitral valve regurgitation. No  evidence of mitral valve stenosis. Tricuspid Valve: The tricuspid valve is normal in structure. Tricuspid valve regurgitation is mild . No evidence of tricuspid stenosis. Aortic Valve: The aortic valve is tricuspid. There is mild calcification of the aortic valve. There is mild aortic valve annular calcification. Aortic valve regurgitation is trivial. No aortic stenosis is present. Pulmonic Valve: The pulmonic valve was not well visualized. Pulmonic valve regurgitation is not visualized. No evidence of pulmonic stenosis. Aorta: The aortic root and ascending aorta are structurally normal, with no evidence of dilitation. Venous: The inferior vena cava is normal in size with greater than 50% respiratory variability, suggesting right atrial pressure of 3 mmHg. IAS/Shunts: No atrial level shunt detected by color flow Doppler.  LEFT VENTRICLE PLAX 2D LVIDd:         3.90 cm     Diastology LVIDs:         2.90 cm     LV e' medial:    3.75 cm/s LV PW:         0.90 cm     LV E/e' medial:  18.0 LV IVS:        0.90 cm     LV e' lateral:   6.23 cm/s LVOT diam:     1.70 cm     LV E/e' lateral: 10.8 LV SV:         45 LV SV Index:   26 LVOT Area:     2.27 cm  LV Volumes (MOD) LV vol d, MOD A2C: 91.6 ml LV vol d, MOD A4C: 92.2 ml LV vol s, MOD A2C: 39.8 ml LV vol s, MOD A4C: 46.4 ml LV SV MOD A2C:     51.8 ml LV SV MOD A4C:     92.2 ml LV SV MOD BP:      47.7 ml RIGHT VENTRICLE             IVC RV S prime:     10.10 cm/s  IVC diam: 1.30  cm TAPSE (M-mode): 1.6 cm LEFT ATRIUM             Index        RIGHT ATRIUM           Index LA diam:        3.20 cm 1.83 cm/m   RA Area:     14.60 cm LA Vol (A2C):   49.8 ml 28.44 ml/m  RA Volume:   36.00 ml  20.56 ml/m LA Vol (A4C):   44.7 ml 25.53 ml/m LA Biplane Vol: 50.1 ml 28.61 ml/m  AORTIC VALVE LVOT Vmax:   85.30 cm/s LVOT Vmean:  61.800 cm/s LVOT VTI:    0.200 m  AORTA Ao Root diam: 2.70 cm Ao Asc diam:  2.80 cm MITRAL VALVE               TRICUSPID VALVE MV Area (PHT): 2.38 cm    TR Peak grad:   23.2 mmHg MV Decel Time: 319 msec    TR Mean grad:   17.0 mmHg MR Peak grad: 100.4 mmHg   TR Vmax:        241.00 cm/s MR Vmax:      501.00 cm/s  TR Vmean:       196.0 cm/s MV E velocity: 67.50 cm/s MV A velocity: 82.50 cm/s  SHUNTS MV E/A ratio:  0.82        Systemic VTI:  0.20 m  Systemic Diam: 1.70 cm Riley Lam MD Electronically signed by Riley Lam MD Signature Date/Time: 12/15/2021/12:33:15 PM    Final    DG ABD ACUTE 2+V W 1V CHEST  Result Date: 12/14/2021 CLINICAL DATA:  Nausea vomiting. EXAM: DG ABDOMEN ACUTE WITH 1 VIEW CHEST COMPARISON:  Chest x-ray of Lori 14, 2019. FINDINGS: EKG leads project over the chest. Cardiomediastinal contours and hilar structures are normal. Lungs are clear. No pneumothorax. No pleural effusion. No free air beneath either the RIGHT or LEFT hemidiaphragm. Moderate to marked RIGHT and moderate LEFT hydronephrosis as exhibited by excreted recently administered contrast material in the urinary tract. Urinary bladder is markedly distended and partially filled with dilute contrast media as well. No signs of bowel obstruction.  Post cholecystectomy. Near complete loss of height of T12. Age indeterminate but new since previous imaging. IMPRESSION: 1. No acute cardiopulmonary disease. 2. Findings that suggest bladder outlet obstruction or urinary retention as evidence by the marked distension of the urinary bladder and bilateral  hydronephrosis RIGHT greater than LEFT. Correlate clinically and consider Foley catheter placement and are dedicated abdominal imaging. 3. T12 compression fracture suggestive of vertebra plana. Acuity uncertain. Correlate with any signs of pain in this area. This could be a cause for urinary retention. Electronically Signed   By: Donzetta Kohut M.D.   On: 12/14/2021 12:39   MR Brain Wo Contrast (neuro protocol)  Result Date: 12/14/2021 CLINICAL DATA:  Acute onset of severe dizziness and nausea. EXAM: MRI HEAD WITHOUT CONTRAST TECHNIQUE: Multiplanar, multiecho pulse sequences of the brain and surrounding structures were obtained without intravenous contrast. COMPARISON:  CTA head and neck 12/14/2021 FINDINGS: Brain: No acute infarct, hemorrhage, or mass lesion is present. Mild atrophy and white matter changes are within normal limits for age. Deep brain nuclei are within normal limits. The ventricles are of normal size. No significant extraaxial fluid collection is present. The internal auditory canals are within normal limits. The brainstem and cerebellum are within normal limits. Vascular: Flow is present in the major intracranial arteries. Skull and upper cervical spine: The craniocervical junction is normal. Upper cervical spine is within normal limits. Marrow signal is unremarkable. Right supraorbital soft tissue swelling is new since the prior exam. Sinuses/Orbits: Minimal fluid is present in the mastoid air cells bilaterally. No obstructing nasopharyngeal lesion is present. The paranasal sinuses and mastoid air cells are otherwise clear. Bilateral lens replacements are noted. Globes and orbits are otherwise unremarkable. IMPRESSION: 1. Normal MRI appearance of the brain for age. No acute or focal lesion to explain the patient's symptoms. 2. Right supraorbital soft tissue swelling is new since the prior exam. Electronically Signed   By: Marin Roberts M.D.   On: 12/14/2021 09:31   CT ANGIO HEAD  NECK W WO CM  Result Date: 12/14/2021 CLINICAL DATA:  87 year old female with dizziness and nausea since 0300 hours. EXAM: CT ANGIOGRAPHY HEAD AND NECK TECHNIQUE: Multidetector CT imaging of the head and neck was performed using the standard protocol during bolus administration of intravenous contrast. Multiplanar CT image reconstructions and MIPs were obtained to evaluate the vascular anatomy. Carotid stenosis measurements (when applicable) are obtained utilizing NASCET criteria, using the distal internal carotid diameter as the denominator. RADIATION DOSE REDUCTION: This exam was performed according to the departmental dose-optimization program which includes automated exposure control, adjustment of the mA and/or kV according to patient size and/or use of iterative reconstruction technique. CONTRAST:  75mL OMNIPAQUE IOHEXOL 350 MG/ML SOLN COMPARISON:  Head CT 03/25/2018. FINDINGS: CT HEAD  Brain: Cerebral volume remains within normal limits for age. No midline shift, mass effect, or evidence of intracranial mass lesion. No ventriculomegaly. No acute intracranial hemorrhage identified. Gray-white matter differentiation appears stable since 2020 and within normal limits for age throughout the brain. Calvarium and skull base: No acute osseous abnormality identified. Paranasal sinuses: Tympanic cavities, Visualized paranasal sinuses and mastoids are clear. Orbits: Mild rightward gaze. No other acute orbit or scalp soft tissue finding. CTA NECK Skeleton: Chronic severe craniocervical and atlantoaxial degeneration with bulky osteophytosis and subchondral erosions. Superimposed cervical facet ankylosis at C3-C4. Advanced lower cervical disc and endplate degeneration. No acute osseous abnormality identified. Upper chest: Emphysema. Visible upper thoracic esophagus is mildly distended with fluid and gas. Otherwise negative visible superior mediastinum. Other neck: Cervical esophagus mildly gas distended. Other neck soft  tissue spaces appear within normal limits. Aortic arch: Porch wrists aortic arch. The entire arch is not included. Probable three-vessel configuration. Distal arch tortuosity, estimated at 33 mm diameter which is dilated but nonaneurysmal. Right carotid system: Visible brachiocephalic artery with mild plaque and no stenosis. Mildly tortuous right CCA origin with no plaque. Calcified plaque at the right carotid bifurcation primarily affects the ECA. Tortuous right ICA with a mildly beaded appearance (series 15, image 19), but no stenosis to the skull base. Left carotid system: Left CCA origin not included. Left CCA patent without stenosis proximal to the bifurcation. At the left carotid bifurcation there is complex plaque with combined 60% origin stenosis and evidence of ulcerated plaque or adherent thrombus in the proximal ICA bulb. See series 12, image 87. A carotid web might also explain this appearance, but the lesion has an unusual trefoil shape. Additional plaque at the left ICA bulb, but no other stenosis to the skull base. Vertebral arteries: Tortuous proximal right subclavian artery of mild plaque and no stenosis. Normal right vertebral artery origin. Tortuous right V1 segment. Tortuous right vertebral artery with no plaque or stenosis to the skull base. Proximal left subclavian artery soft and calcified plaque with mild stenosis. Normal left vertebral artery origin. Tortuous left vertebral artery is codominant and patent to the skull base with no plaque or stenosis. CTA HEAD Posterior circulation: Codominant distal vertebral arteries and patent vertebrobasilar junction without stenosis. Patent right PICA and dominant appearing left AICA origins. Patent basilar artery without stenosis. Patent SCA and PCA origins. Posterior communicating arteries are diminutive or absent. Bilateral PCA branches are within normal limits. Anterior circulation: Both ICA siphons are patent with tortuosity and mild to moderate  calcified plaque, but only mild distal right cavernous segment stenosis. Patent carotid termini. Normal MCA and ACA origins. Normal anterior communicating artery and bilateral ACA branches. Left MCA M1 segment and trifurcation are patent without stenosis. Right MCA M1 segment and bifurcation are patent without stenosis. Bilateral MCA branches are within normal limits. Venous sinuses: Early contrast timing, not well evaluated. Anatomic variants: None. Review of the MIP images confirms the above findings IMPRESSION: 1. Negative for large vessel occlusion, but Positive for complex plaque at the Left ICA origin where it's difficult to exclude ADHERENT THROMBUS within the vessel lumen (series 12, image 87, but alternatively this appearance might be ulcerated plaque and/or an unusual carotid web. Subsequent stenosis numerically estimated at 60%. 2. Positive also for evidence of cervical Right ICA Fibromuscular Dysplasia (FMD). 3. But otherwise mild for age atherosclerosis, and essentially negative intracranial anterior circulation with no other arterial stenosis identified. 4. Stable CT appearance of the brain, No acute intracranial abnormality. 5. Aortic  Atherosclerosis (ICD10-I70.0) and Emphysema (ICD10-J43.9). Tortuous aortic arch. 6. 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 Electronically Signed   By: Odessa Fleming M.D.   On: 12/14/2021 08:42    Microbiology: Results for orders placed or performed during the hospital encounter of 01/26/17  Culture, blood (routine x 2) Call MD if unable to obtain prior to antibiotics being given     Status: None   Collection Time: 01/26/17  4:10 PM   Specimen: BLOOD RIGHT ARM  Result Value Ref Range Status   Specimen Description BLOOD RIGHT ARM  Final   Special Requests IN PEDIATRIC BOTTLE Blood Culture adequate volume  Final   Culture   Final    NO GROWTH 5 DAYS Performed at [facility], [address] [place], [place] [address]     Report Status 01/31/2017 FINAL  Final  Culture, blood (routine x 2) Call MD if unable to obtain prior to antibiotics being given     Status: None   Collection Time: 01/26/17  4:10 PM   Specimen: BLOOD LEFT HAND  Result Value Ref Range Status   Specimen Description BLOOD LEFT HAND  Final   Special Requests   Final    BOTTLES DRAWN AEROBIC AND ANAEROBIC Blood Culture adequate volume   Culture   Final    NO GROWTH 5 DAYS Performed at [facility], [address], [place], [place] [address]    Report Status 01/31/2017 FINAL  Final    Labs: CBC: Recent Labs  Lab 12/16/21 0342 12/19/21 0819 12/21/21 0711  WBC 13.9* 10.8* 8.9  NEUTROABS 11.3* 8.7* 6.1  HGB 13.4 13.3 12.3  HCT 41.2 40.6 37.1  MCV 85.5 84.6 85.3  PLT 251 262 282   Basic Metabolic Panel: Recent Labs  Lab 12/14/21 1347 12/15/21 0743 12/16/21 0342 12/17/21 0339 12/19/21 0819 12/20/21 1606  NA  --  137 138 136 133* 133*  K  --  3.7 3.9 3.6 3.2* 4.7  CL  --  99 99 99 97* 96*  CO2  --  23 28 25 24 23   GLUCOSE  --  107* 105* 121* 100* 120*  BUN  --  16 18 13 17 21   CREATININE  --  1.37* 1.23* 0.91 0.91 1.04*  CALCIUM  --  9.0 9.3 8.9 8.8* 9.0  MG 1.7  --  1.9  --   --   --    Liver Function Tests: No results for input(s): "AST", "ALT", "ALKPHOS", "BILITOT", "PROT", "ALBUMIN" in the last 168 hours. CBG: No results for input(s): "GLUCAP" in the last 168 hours.  Discharge time spent: approximately 35 minutes spent on discharge counseling, evaluation of patient on day of discharge, and coordination of discharge planning with nursing, social work, pharmacy and case management  Signed: , MD [facility] 12/20/2021

## 2021-12-21 NOTE — Progress Notes (Signed)
Physical Therapy Session Note  Patient Details  Name: [name] MRN: [id number] Date of Birth: [birth year]  Today's Date: 12/21/2021 PT Individual Time: 1520-1630 PT Individual Time Calculation (min): 70 min   Short Term Goals: Week 1:  PT Short Term Goal 1 (Week 1): Pt will perform supine<>sit with supervision PT Short Term Goal 2 (Week 1): Pt will perform sit<>stands using LRAD with CGA PT Short Term Goal 3 (Week 1): Pt will perform bed<>chair transfers using LRAD with CGA PT Short Term Goal 4 (Week 1): Pt will ambulate at least 73ft using LRAD with CGA PT Short Term Goal 5 (Week 1): Pt will navigate 2 stairs in preparation for home entry with CGA  Skilled Therapeutic Interventions/Progress Updates:    Pt received sitting in recliner and agreeable to therapy session. R stand pivot transfer recliner>w/c with heavy min assist to rise into standing and min/mod assist for balance while turning with pt using L UE support on therapist and R UE support on w/c armrest - cuing for sequencing and upright posture.  Transported to/from gym in w/c for time management and energy conservation.  Sit<>stands to/from w/c to +2 B HHA with heavy min assist for rising and balance.   Gait training 75ft using +2 B HHA with heavy min assist for balance. Pt demonstrating the following gait deviations with therapist providing the described cuing and facilitation for improvement:  - maintains forward flexed posture (thoracic kyphosis) - short B LE step lengths - no tremors/shaking noted - pt reports feeling "incoordinated" - very slow gait speed  Gait training additional 78ft with +2 B HHA progressed to only R HHA of 1 from therapist (+2 providing close supervision for pt confidence) - heavy min assist for balance - continues with above gait deviations.  Stair navigation training ascending/descending 4 steps (6" height) using B HRs with min/mod assist with step-to pattern leading with L LE in each direction,  facing forward.  Gait training 68ft using RW with min assist of 1 for AD management and balance - pt noted to lack adequate R weight shift during R stance - performed a turn with AD with no significant instability nor incoordination in B LEs noted - cuing for improved upright posture - achieved mild reciprocal stepping pattern and noticed slowly, but improving gait speed.   Pt reports increasing R ankle pain therefore deferred additional standing activities.   Pt reports feeling urge to void bowels or bladder, difficulty determining. Transported back to room. R stand pivot w/c>BSC with heavy min assist for balance while turning and pt benefiting from step-by-step cuing for sequencing due to break down in motor planning when feeling nervous/off balance. Standing with heavy min/light mod assist for balance during dependent LB clothing management. Despite time given, pt unable to void - notified nursing staff. R stand pivot BSC>EOB with heavy min assist as described above.   Sit>supine, HOB elevated per pt request because she says lying flat causes dizziness/nausea, with supervision. Pt left supported upright in bed with needs in reach and bed alarm on.  Therapy Documentation Precautions:  Precautions Precautions: Fall, Other (comment) Precaution Comments: dizziness, HOH (hears better in L ear) Restrictions Weight Bearing Restrictions: No  Pain:  Reports continued pain in her feet but "improving ever day" currently rated as 5/10 stating "it's not that bad." Increases during session with more WBing activities.   Therapy/Group: Individual Therapy  Ginny Forth , PT, DPT, NCS, CSRS 12/21/2021, 3:40 PM

## 2021-12-21 NOTE — Evaluation (Signed)
Occupational Therapy Assessment and Plan  Patient Details  Name: [name] MRN: [id number] Date of Birth: 1934-05-20  OT Diagnosis: abnormal posture, ataxia, hemiplegia affecting non-dominant side, and muscle weakness (generalized) Rehab Potential: Rehab Potential (ACUTE ONLY): Good ELOS: 12-16 days   Today's Date: 12/21/2021 OT Individual Time: 0881-1031 OT Individual Time Calculation (min): 69 min     Hospital Problem: Principal Problem:   Cerebellar stroke ([facility])   Past Medical History:  Past Medical History:  Diagnosis Date   Arthritis    Atrial fibrillation (HCC)    Carotid artery occlusion    Colitis    COPD (chronic obstructive pulmonary disease) (HCC)    Diverticulitis    Hypertension    Keratosis, seborrheic    Lumbar spondylosis    w/impingement L3-S1   Past Surgical History:  Past Surgical History:  Procedure Laterality Date   ABDOMINAL HYSTERECTOMY     BREAST EXCISIONAL BIOPSY Left    BREAST EXCISIONAL BIOPSY Left    BREAST EXCISIONAL BIOPSY Right    BREAST SURGERY     cyst removal   CAROTID ENDARTERECTOMY Left 2010   CHOLECYSTECTOMY     EYE SURGERY Bilateral 06/2014   cataracts   JOINT REPLACEMENT     RIGHT KNEE   REPLACEMENT TOTAL KNEE Right     Assessment & Plan Clinical Impression: [name]. Zobel is an 87 year old female with history of HTN, PAF (patient had d/c coumadin due to sister's bleed hx), L-CEA, COPD, lumbar spondylosis, SBO who was admitted on 12/14/21 with reports of dizziness and nausea w/emesis and inability to get out of bed. She also reported fall a week PTA and that she struck her head without any significant injuries. She was noted to have A Fib with elevated BP and K-2.8. CTA head/neck was negative for LVO and showed complex plaque L-ICA origin w/60% stenosis and evidence of R-ICA fibromuscular dysplasia. MRI brain showed normal brain for age and no acute abnormality. Dr. Leonel Ramsay felt that patient with small cerebellar stroke not  seen on Xray and likely embolic due to PAF. She was started on eliquis as patient agreeable. 2 D echo showed EF 55-60% with mild aortic valve calcification  w/trivial AVR.  LDL-27 and at goal. Anxiety managed with IV ativan, she was treated with IVF and hypokalemia has resolved with supplementation.    KUB showed moderate to marked and moderate left hydronephrosis with markedly distended bladder concerning for obstruction --foley recommended.    Also reported to have horizontal nystagmus with dizziness with movement due to vestibular dysfunction. On 11/26, she developed  A fib w/RVR --rates up to 170 treated with IV metoprolol with rates back in 70's. Therapy has been working with patient who continues to be limited by anxiety, dizziness, nausea, pain in BLE and feet, shakiness/tremors with standing,  attempts at vestibular rehab as well as anxiety management. CIR recommended due to functional decline. She is very hard of hearing.     Patient transferred to CIR on 12/20/2021 .    Patient currently requires min-max with basic self-care skills and IADL secondary to muscle weakness, decreased cardiorespiratoy endurance, ataxia, decreased coordination, and decreased motor planning, decreased visual perceptual skills and decreased visual motor skills, central origin, and decreased sitting balance, decreased standing balance, decreased postural control, and decreased balance strategies.  Prior to hospitalization, patient could complete BADLs with independent .  Patient will benefit from skilled intervention to decrease level of assist with basic self-care skills, increase independence with basic self-care skills,  and increase level of independence with iADL prior to discharge home with care partner.  Anticipate patient will require 24 hour supervision and follow up home health.  OT - End of Session Activity Tolerance: Decreased this session Endurance Deficit: Yes Endurance Deficit Description: decreased  activity tolerance during BADLs OT Assessment Rehab Potential (ACUTE ONLY): Good OT Patient demonstrates impairments in the following area(s): Balance;Endurance;Motor;Pain;Perception;Safety;Vision OT Basic ADL's Functional Problem(s): Grooming;Bathing;Dressing;Toileting OT Advanced ADL's Functional Problem(s): Simple Meal Preparation OT Transfers Functional Problem(s): Toilet;Tub/Shower OT Plan OT Intensity: Minimum of 1-2 x/day, 45 to 90 minutes OT Frequency: 5 out of 7 days OT Duration/Estimated Length of Stay: 12-16 days OT Treatment/Interventions: Balance/vestibular training;Discharge planning;Pain management;Self Care/advanced ADL retraining;Therapeutic Activities;UE/LE Coordination activities;Disease mangement/prevention;Functional mobility training;Patient/family education;Skin care/wound managment;Therapeutic Exercise;Visual/perceptual remediation/compensation;Community reintegration;DME/adaptive equipment instruction;Neuromuscular re-education;Psychosocial support;UE/LE Strength taining/ROM;Wheelchair propulsion/positioning OT Self Feeding Anticipated Outcome(s): Independent OT Basic Self-Care Anticipated Outcome(s): Supervision OT Toileting Anticipated Outcome(s): Supervision OT Bathroom Transfers Anticipated Outcome(s): Supervision OT Recommendation Recommendations for Other Services: Vestibular eval Patient destination: Home Follow Up Recommendations: 24 hour supervision/assistance;Home health OT Equipment Recommended: To be determined Equipment Details: Pt may need BSC, TBD   OT Evaluation Precautions/Restrictions  Precautions Precautions: Fall;Other (comment) Precaution Comments: dizziness, HOH (hears better in L ear) Restrictions Weight Bearing Restrictions: No General Chart Reviewed: Yes Additional Pertinent History: HOH, Anxiety, A-fib, tremors Family/Caregiver Present: No Vital Signs Therapy Vitals Temp: 97.8 F (36.6 C) Temp Source: Oral Pulse Rate:  61 Resp: 16 BP: (Abnormal) 129/49 Patient Position (if appropriate): Sitting Oxygen Therapy SpO2: 95 % O2 Device: Room Air Pain   Home Living/Prior Functioning Home Living Living Arrangements: Alone Available Help at Discharge: Family, Available 24 hours/day Type of Home: House Home Access: Stairs to enter, Other (comment) Entrance Stairs-Number of Steps: 2 Entrance Stairs-Rails:  (on front door) Home Layout: One level Bathroom Shower/Tub: Multimedia programmer: Standard Bathroom Accessibility: Yes  Lives With: Alone IADL History Homemaking Responsibilities: Yes Meal Prep Responsibility: Primary Laundry Responsibility: Primary Cleaning Responsibility: Primary Bill Paying/Finance Responsibility: No Shopping Responsibility: No Current License: Yes Mode of Transportation: Car Occupation: Retired Prior Function Level of Independence: Independent with gait, Independent with transfers, Independent with homemaking with ambulation, Other (comment)  Able to Take Stairs?: Yes Driving: Yes Vocation: Retired Biomedical scientist: Web designer from [place] Vision/History: 1 Wears glasses Ability to See in Adequate Light: 1 Impaired Vision Assessment?: Vision impaired- to be further tested in functional context Eye Alignment: Within Functional Limits Ocular Range of Motion: Within Functional Limits Alignment/Gaze Preference: Chin down;Head turned Tracking/Visual Pursuits: Decreased smoothness of horizontal tracking;Decreased smoothness of eye movement to LEFT superior field;Decreased smoothness of eye movement to LEFT inferior field Convergence: Within functional limits Visual Fields: No apparent deficits Perception  Perception: Impaired Praxis Praxis: Intact Cognition Cognition Overall Cognitive Status: Within Functional Limits for tasks assessed Arousal/Alertness: Awake/alert Orientation Level: Person;Place;Situation Person:  Oriented Place: Oriented Situation: Oriented Memory: Appears intact Attention: Focused;Sustained Focused Attention: Appears intact Sustained Attention: Appears intact Awareness: Appears intact Problem Solving: Appears intact Executive Function: Reasoning;Sequencing;Organizing;Decision Making;Initiating;Self Monitoring;Self Correcting Reasoning: Appears intact Sequencing: Appears intact Organizing: Appears intact Decision Making: Appears intact Initiating: Appears intact Self Monitoring: Appears intact Self Correcting: Appears intact Safety/Judgment: Appears intact Brief Interview for Mental Status (BIMS) Repetition of Three Words (First Attempt): 3 Temporal Orientation: Year: Correct Temporal Orientation: Month: Accurate within 5 days Temporal Orientation: Day: Correct Recall: "Sock": Yes, no cue required Recall: "Blue": Yes, no cue required Recall: "Bed": Yes, after cueing ("a piece of furniture") BIMS Summary Score:  14 Sensation Sensation Light Touch: Appears Intact Hot/Cold: Not tested Proprioception: Impaired by gross assessment Stereognosis: Not tested Coordination Gross Motor Movements are Fluid and Coordinated: No Fine Motor Movements are Fluid and Coordinated: No Coordination and Movement Description: decreased smoothness of LUE movements; BLE movements impaired d/t imbalance and dizziness Finger Nose Finger Test: Moderate dysmetria with noted undershooting with L>R Motor  Motor Motor: Ataxia Motor - Skilled Clinical Observations: 4+/5 strength BUE; Ataxia and tremors in BLEs  Trunk/Postural Assessment  Cervical Assessment Cervical Assessment: Exceptions to [facility] (Foward head; limited rotation and lateral flexion d/t muscle tightness) Thoracic Assessment Thoracic Assessment: Exceptions to [facility] (rounded shoudlers) Lumbar Assessment Lumbar Assessment: Exceptions to [facility] (posterior pelvic tilt) Postural Control Postural Control: Deficits on evaluation Trunk  Control: anterior tilt in standing Righting Reactions: impaired Protective Responses: impaired  Balance Balance Balance Assessed: Yes Static Sitting Balance Static Sitting - Balance Support: Feet supported;Bilateral upper extremity supported Static Sitting - Level of Assistance: 5: Stand by assistance Dynamic Sitting Balance Dynamic Sitting - Balance Support: Bilateral upper extremity supported;Feet supported Dynamic Sitting - Level of Assistance: 4: Min assist Dynamic Sitting - Balance Activities: Other (comment) (BADLs) Static Standing Balance Static Standing - Balance Support: Bilateral upper extremity supported Static Standing - Level of Assistance: 4: Min assist Dynamic Standing Balance Dynamic Standing - Balance Support: During functional activity Dynamic Standing - Level of Assistance: 4: Min assist Extremity/Trunk Assessment RUE Assessment RUE Assessment: Within Functional Limits LUE Assessment LUE Assessment: Exceptions to [facility] Active Range of Motion (AROM) Comments: decreased smoothness of movements General Strength Comments: mild decrease in strength,  Care Tool Care Tool Self Care Eating   Eating Assist Level: Set up assist    Oral Care    Oral Care Assist Level: Contact Guard/Toucning assist    Bathing   Body parts bathed by patient: Right arm;Left arm;Chest;Abdomen;Front perineal area;Face;Left upper leg;Right upper leg Body parts bathed by helper: Buttocks;Left lower leg;Right lower leg   Assist Level: Moderate Assistance - Patient 50 - 74%    Upper Body Dressing(including orthotics)   What is the patient wearing?: Pull over shirt   Assist Level: Minimal Assistance - Patient > 75%    Lower Body Dressing (excluding footwear)   What is the patient wearing?: Incontinence brief;Pants Assist for lower body dressing: Moderate Assistance - Patient 50 - 74%    Putting on/Taking off footwear   What is the patient wearing?: Badger for footwear: Moderate  Assistance - Patient 50 - 74%       Care Tool Toileting Toileting activity   Assist for toileting: Moderate Assistance - Patient 50 - 74%     Care Tool Bed Mobility Roll left and right activity        Sit to lying activity        Lying to sitting on side of bed activity   Lying to sitting on side of bed assist level: the ability to move from lying on the back to sitting on the side of the bed with no back support.: Minimal Assistance - Patient > 75%     Care Tool Transfers Sit to stand transfer   Sit to stand assist level: Moderate Assistance - Patient 50 - 74%    Chair/bed transfer         Toilet transfer   Assist Level: Moderate Assistance - Patient 50 - 74%     Care Tool Cognition  Expression of Ideas and Wants Expression of Ideas and Wants: 4. Without difficulty (complex  and basic) - expresses complex messages without difficulty and with speech that is clear and easy to understand  Understanding Verbal and Non-Verbal Content Understanding Verbal and Non-Verbal Content: 4. Understands (complex and basic) - clear comprehension without cues or repetitions   Memory/Recall Ability Memory/Recall Ability : Current season;Location of own room;That he or she is in a hospital/hospital unit   Refer to Care Plan for [place] 1 OT Short Term Goal 1 (Week 1): Pt will complete toileting with min A OT Short Term Goal 2 (Week 1): Pt will complete sit>stand in prep for BADLs CGA LRAD OT Short Term Goal 3 (Week 1): Pt will complete LB dressing with min A  Recommendations for other services: Therapeutic Recreation  Pet therapy and Stress management   Skilled Therapeutic Intervention Skilled Therapeutic Intervention/Update:  OT evaluation and intervention initiated with education provided on OT role, goals, and plan of care. Pt received resting in wc in good spirits and motivated to participate in skilled OT session. Pt reporting pain in L arm at previous  IV site. RN notified an Rn reporting site is being treated. Pt reported need to use restroom and transferred stand pivot min A +time using RW to [facility]. Pt provided increased time on [facility] d/t Pt reporting she was going to have a bowel movement. Pt had continent void and no bowel movement at this time. Pt completed UB bathing min A and LB mod A seated on BSC. Pt donned shirt min A to bring shirt over her back and brief/pants mod A to bring pants to waist in standing. Pt completed grooming tasks brushing her teeth and hair at sink seated in wc with supervision. Pt transferred squat pivot from wc to recliner with min A and was left resting in recliner with seat belt alarm on, call bell in reach, and all needs met. Pt limited by pain in BLE knees during today's session as well as urgency to use restroom. Pt would benefit from further OT services to increase independence in BADL and IADL tasks and decrease caregiver bourdon.   ADL ADL Eating: Supervision/safety Where Assessed-Eating: Bed level Grooming: Supervision/safety Where Assessed-Grooming: Sitting at sink Upper Body Bathing: Minimal assistance Where Assessed-Upper Body Bathing: Sitting at sink Lower Body Bathing: Moderate assistance Where Assessed-Lower Body Bathing: Sitting at sink Upper Body Dressing: Minimal assistance Where Assessed-Upper Body Dressing: Sitting at sink Lower Body Dressing: Moderate assistance Where Assessed-Lower Body Dressing: Sitting at sink Toileting: Moderate assistance Where Assessed-Toileting: Bedside Commode Toilet Transfer: Minimal assistance Toilet Transfer Method: Stand pivot Toilet Transfer Equipment: Bedside commode Mobility  Bed Mobility Bed Mobility: Supine to Sit Supine to Sit: Minimal Assistance - Patient > 75% Transfers Stand to Sit: Minimal Assistance - Patient > 75%;Moderate Assistance - Patient 50-74%   Discharge Criteria: Patient will be discharged from OT if patient refuses treatment 3 consecutive  times without medical reason, if treatment goals not met, if there is a change in medical status, if patient makes no progress towards goals or if patient is discharged from hospital.  The above assessment, treatment plan, treatment alternatives and goals were discussed and mutually agreed upon: by patient  Janey Genta 12/21/2021, 2:31 PM

## 2021-12-22 DIAGNOSIS — I639 Cerebral infarction, unspecified: Secondary | ICD-10-CM | POA: Diagnosis not present

## 2021-12-22 MED ORDER — GABAPENTIN 100 MG PO CAPS
200.0000 mg | ORAL_CAPSULE | Freq: Every day | ORAL | Status: DC
  Administered 2021-12-22 – 2021-12-31 (×10): 200 mg via ORAL
  Filled 2021-12-22 (×10): qty 2

## 2021-12-22 NOTE — Progress Notes (Signed)
Patient ID: [name], female   DOB: Dec 09, 1934, 87 y.o.   MRN: [id number] Met with the patient to review current situation, rehab process, team conference and plan of care. Discussed feet aching, can hardly put feet on the floor. Slept better last pm, hx of insomnia; reviewed prn meds available. CBP; back is not a problem at present. Reported she plans to go home solo; niece is the only family available to assist. Reviewed secondary risks including HTN, A-bid, DM,(A1C 5.7), HF with medications and dietary modifications. Discussed HF management with low salt diet and daily weights. Continue to follow along to address educational needs to facilitate preparation for discharge. [name]

## 2021-12-22 NOTE — Progress Notes (Signed)
PROGRESS NOTE   Subjective/Complaints:  Slept better , still with bilateral foot and ankle pain present at rest but worsened when up   ROS- neg CP, SOB, N/V Objective:   US RENAL  Result Date: 12/21/2021 CLINICAL DATA:  Hydronephrosis EXAM: RENAL / URINARY TRACT ULTRASOUND COMPLETE COMPARISON:  None available. FINDINGS: Right Kidney: Renal measurements: 8.6 x 3.8 x 4.5 cm = volume: 77.0 ML. No hydronephrosis. Increased renal cortical echogenicity. Left Kidney: Renal measurements: 9.6 x 4.2 x 3.8 cm = volume: 79.0 mL. No hydronephrosis. Increased renal cortical echogenicity. Bladder: Appears normal for degree of bladder distention. Other: None. IMPRESSION: No hydronephrosis. Increased renal cortical echogenicity, suggesting medical renal disease. Electronically Signed   By: Caprice Renshaw M.D.   On: 12/21/2021 09:03   DG Lumbar Spine 2-3 Views  Result Date: 12/20/2021 CLINICAL DATA:  Back pain for the past several days.  No known fall. EXAM: LUMBAR SPINE - 2-3 VIEW COMPARISON:  Lumbar spine CT 03/26/2018 FINDINGS: There are 5 non-rib-bearing lumbar-type vertebral bodies. Mild scoliotic curvature of the thoracolumbar spine with dominant cortical morning convex to the left measuring approximately 14 degrees (has been from the inferior endplate of T11 to the inferior endplate of L3). No anterolisthesis or retrolisthesis. Severe (greater than 75%) compression deformity involving the T12 vertebral body. Remaining lumbar vertebral body heights appear preserved. Moderate to severe multilevel lumbar spine DDD, worse at L4-L5 and L5-S1 with disc space height loss, endplate irregularity and sclerosis. Post cholecystectomy. Atherosclerotic plaque within the abdominal aorta. IMPRESSION: 1. Age-indeterminate severe (greater than 75%) compression deformity of the T12 vertebral body. Correlation point tenderness at this location is advised. 2. Moderate to  severe multilevel lumbar spine DDD, worse at L4-L5 and L5-S1 Electronically Signed   By: Simonne Come M.D.   On: 12/20/2021 18:59   DG Abd 1 View  Result Date: 12/20/2021 CLINICAL DATA:  Nausea and vomiting. EXAM: ABDOMEN - 1 VIEW COMPARISON:  12/14/2021 FINDINGS: Moderate colonic stool burden without evidence of enteric obstruction. Nondiagnostic evaluation for pneumoperitoneum. No definite pneumatosis or portal venous gas. Post cholecystectomy. Degenerative change of the lumbar spine and bilateral hips is suspected though incompletely evaluated. IMPRESSION: Moderate colonic stool burden without evidence of enteric obstruction. Electronically Signed   By: Simonne Come M.D.   On: 12/20/2021 18:56   Recent Labs    12/19/21 0819 12/21/21 0711  WBC 10.8* 8.9  HGB 13.3 12.3  HCT 40.6 37.1  PLT 262 282    Recent Labs    12/20/21 1606 12/21/21 0711  NA 133* 135  K 4.7 4.7  CL 96* 98  CO2 23 25  GLUCOSE 120* 110*  BUN 21 22  CREATININE 1.04* 1.04*  CALCIUM 9.0 9.4     Intake/Output Summary (Last 24 hours) at 12/22/2021 0737 Last data filed at 12/22/2021 1610 Gross per 24 hour  Intake 1200 ml  Output 2262 ml  Net -1062 ml        Physical Exam: Vital Signs Blood pressure (!) 141/53, pulse 62, temperature 98.8 F (37.1 C), temperature source Oral, resp. rate 17, SpO2 92 %.  General: No acute distress Mood and affect are appropriate Heart: Regular rate and rhythm  no rubs murmurs or extra sounds Lungs: Clear to auscultation, breathing unlabored, no rales or wheezes Abdomen: Positive bowel sounds, soft nontender to palpation, nondistended Extremities: No clubbing, cyanosis, or edema Skin: No evidence of breakdown, no evidence of rash Neurologic: Cranial nerves II through XII intact, motor strength is 4/5 in bilateral deltoid, bicep, tricep, grip, hip flexor, knee extensors, ankle dorsiflexor and plantar flexor  Cerebellar exam normal finger to nose to finger as well in bilateral  upper Musculoskeletal: Full range of motion in all 4 extremities. No joint swelling, pain to palp RIght patellar , no foot tenderness  or swelling ,Pain to palpation right achilles insertion >L exacerbated by ankle dorsiflexion     Assessment/Plan: 1. Functional deficits which require 3+ hours per day of interdisciplinary therapy in a comprehensive inpatient rehab setting. Physiatrist is providing close team supervision and 24 hour management of active medical problems listed below. Physiatrist and rehab team continue to assess barriers to discharge/monitor patient progress toward functional and medical goals  Care Tool:  Bathing    Body parts bathed by patient: Right arm, Left arm, Chest, Abdomen, Front perineal area, Face, Left upper leg, Right upper leg   Body parts bathed by helper: Buttocks, Left lower leg, Right lower leg     Bathing assist Assist Level: Moderate Assistance - Patient 50 - 74%     Upper Body Dressing/Undressing Upper body dressing   What is the patient wearing?: Pull over shirt    Upper body assist Assist Level: Minimal Assistance - Patient > 75%    Lower Body Dressing/Undressing Lower body dressing      What is the patient wearing?: Incontinence brief, Pants     Lower body assist Assist for lower body dressing: Moderate Assistance - Patient 50 - 74%     Toileting Toileting    Toileting assist Assist for toileting: Moderate Assistance - Patient 50 - 74%     Transfers Chair/bed transfer  Transfers assist     Chair/bed transfer assist level: Moderate Assistance - Patient 50 - 74%     Locomotion Ambulation   Ambulation assist   Ambulation activity did not occur: Safety/medical concerns          Walk 10 feet activity   Assist  Walk 10 feet activity did not occur: Safety/medical concerns        Walk 50 feet activity   Assist Walk 50 feet with 2 turns activity did not occur: Safety/medical concerns         Walk 150 feet  activity   Assist Walk 150 feet activity did not occur: Safety/medical concerns         Walk 10 feet on uneven surface  activity   Assist Walk 10 feet on uneven surfaces activity did not occur: Safety/medical concerns         Wheelchair     Assist Is the patient using a wheelchair?: Yes Type of Wheelchair: Manual    Wheelchair assist level: Dependent - Patient 0%      Wheelchair 50 feet with 2 turns activity    Assist        Assist Level: Dependent - Patient 0%   Wheelchair 150 feet activity     Assist      Assist Level: Dependent - Patient 0%   Blood pressure (!) 141/53, pulse 62, temperature 98.8 F (37.1 C), temperature source Oral, resp. rate 17, SpO2 92 %.  Medical Problem List and Plan: 1. Functional deficits secondary to small right cerebellar  stroke.              -patient may shower             -ELOS/Goals: 12-16 days S             -Admit to CIR 2.  Antithrombotics: -DVT/anticoagulation:  Pharmaceutical: Eliquis             -antiplatelet therapy: N/a 3. BLE neuropathy: will add low dose gabapentin Also with chronic low back pain , had injections in past in [place] which were "quite helpful" Xray lumbar sow severe DDD, spondylosis , LE pain may be related to Lumbar stenosis,  need to increase gabapentin  Also has bilateral acilles pain , R>L will order Bilateral ankle braces  4. Insomnia: continue melatonin and gabapentin 5. Neuropsych/cognition: This patient is capable of making decisions on her own behalf. 6. Skin/Wound Care: Routine pressure relief measures.  7. Fluids/Electrolytes/Nutrition: Monitor [email or web address] elevation of LFTs monitor CMET weekly, only received 650mg  of acetaminophen in last 3 d, may be related to crestor but has been on this for at least 27yr 8. A fib: Monitor HR TID--continue tenormin and Eliquis. HR controlled 12/1 9. HTN: Monitor BP TID- continue Amlodipine, Cozaar and tenormin.  Vitals:   12/21/21 1912 12/22/21  0543  BP: (!) 119/58 (!) 141/53  Pulse: 65 62  Resp: 16 17  Temp: 98.7 F (37.1 C) 98.8 F (37.1 C)  SpO2: 96% 92%   Controlled 12/1 10 AKI- has resolved.  11. Bilateral hydronephrosis on KUB: Will repeat KUB. Monitor voiding with PVR/bladder scans.              --d/c ditropan.              --Neurogenic bladder? As has hx of lumbar spondylosis with impingement and recent fall PVR pending F/u KUB shows bladder decompressed , kidney fxn normal , prior KUB has evidence of residual contrast showing dilatation of renal calyces, Normal kidney 14/1 12. Constipation: Will add miralax 13. Recurrent hypokalemia: Supplemented X 1 today.     Latest Ref Rng & Units 12/21/2021    7:11 AM 12/20/2021    4:06 PM 12/19/2021    8:19 AM  BMP  Glucose 70 - 99 mg/dL 12/21/2021  712  458   BUN 8 - 23 mg/dL 22  21  17    Creatinine 0.44 - 1.00 mg/dL 099   8.33   Sodium 135 - 145 mmol/L 135  133  133   Potassium 3.5 - 5.1 mmol/L 4.7  4.7  3.2   Chloride 98 - 111 mmol/L 98  96  97   CO2 22 - 32 mmol/L 25  23  24    Calcium 8.9 - 10.3 mg/dL 9.4  9.0  8.8   K+ wnl 11/30  14. H/o Depression/anxiety: continue Zoloft and ativan prn 15. Neuropathy: Will order lumbar films w/recent fall.     LOS: 2 days A FACE TO FACE EVALUATION WAS PERFORMED  0.53 12/22/2021, 7:37 AM

## 2021-12-22 NOTE — Progress Notes (Signed)
Occupational Therapy Session Note  Patient Details  Name: [name] MRN: [id number] Date of Birth: February 01, 1934  Today's Date: 12/22/2021 OT Individual Time: 0800-0900 OT Individual Time Calculation (min): 60 min  OT Individual Time: 0254-2706 OT Individual Time Calculation (min): 60 min    Short Term Goals: Week 1:  OT Short Term Goal 1 (Week 1): Pt will complete toileting with min A OT Short Term Goal 2 (Week 1): Pt will complete sit>stand in prep for BADLs CGA LRAD OT Short Term Goal 3 (Week 1): Pt will complete LB dressing with min A  Skilled Therapeutic Interventions/Progress Updates:     AM Session: Pt received resting in bed in good spirits and receptive to skilled OT session. Pt reported 6/10 pain in BLEs at rest and 10/10 in standing. Pt reported she had been given pain medications this AM. Pt provided therapeutic motivation and support and positioned in bed at end of session to decrease pain level.   Pt supine>EOB min A with HOB elevated. Pt reporting urgent need to use restroom. Pt completed squat pivot transfer to [facility] with mod A and max verbal cues for body mechanics and positioning. Pt provided increased time on BSC (continent void and loose bowel movement). Pt completed sit>stand using RW with mod A to complete transfer and maintain standing balance. Peri-care performed with Pt in standing total A. Pt unable to tolerate prolonged standing d/t pain. Pt completed UB dressing on BSC with set-up assist and LB dressing mod A to bring pants to waist. Pt completed squat pivot transfer back to bed to L mad A using RW. Pt EOB>supine min A to lift legs. Pt was left resting in bed with call bell in reach, bed alarm on, and all needs met.   PM Session: Pt received returning from bathroom with nursing staff. Pt in good spirits and motivated to participate in skilled OT session. Pt reporting pain has decreased in her feet to 4/10 at rest and during ambulation.  Pt completed grooming tasks  seated at sink with supervision brushing her teeth and combing her hair.   Pt propelled her wc ~250 ft from her room to therapy gym for functional mobility practice and endurance training. Pt required two rest breaks during task and moderate verbal cues for technique and to navigate around obstacles.   Pt sit>stand using RW min A with mod cues for body positioning and safety. Pt ambulated ~15 feet using RW to EOM with CGA +time. Pt completed functional reaching and dynamic seating balance activities seated EOM to facilitate upright posture and chest opening. Vertical mirror placed in front of Pt to provide visual feedback. Pt instructed to use BUEs to place squigz on mirror crossing midline to retrieve squigz when presented in R or L visual field. Pt required mod verbal cues to self correct body alignment toward midline. Pt ambulated back to wc using RW CGA and was transported back to room total A in wc d/t fatigue.   Pt completed stand step transfer using RW back to bed CGA and was left resting in bed with bed alarm on, call bell in reach, and all needs met.   Therapy Documentation Precautions:  Precautions Precautions: Fall, Other (comment) Precaution Comments: dizziness, HOH (hears better in L ear) Restrictions Weight Bearing Restrictions: No General:    Pain: Pain Assessment Pain Scale: 0-10 Pain Score: 3  Faces Pain Scale: Hurts a little bit Pain Type: Acute pain Pain Location: Foot Pain Orientation: Right;Left Pain Descriptors / Indicators: Aching  Pain Frequency: Intermittent Pain Intervention(s): Medication (See eMAR) PAINAD (Pain Assessment in Advanced Dementia) Breathing: normal Negative Vocalization: none Body Language: relaxed ADL: ADL Eating: Supervision/safety Where Assessed-Eating: Bed level Grooming: Supervision/safety Where Assessed-Grooming: Sitting at sink Upper Body Bathing: Minimal assistance Where Assessed-Upper Body Bathing: Sitting at sink Lower Body  Bathing: Moderate assistance Where Assessed-Lower Body Bathing: Sitting at sink Upper Body Dressing: Minimal assistance Where Assessed-Upper Body Dressing: Sitting at sink Lower Body Dressing: Moderate assistance Where Assessed-Lower Body Dressing: Sitting at sink Toileting: Moderate assistance Where Assessed-Toileting: Bedside Commode Toilet Transfer: Minimal assistance Toilet Transfer Method: Stand pivot Toilet Transfer Equipment: Bedside commode     Therapy/Group: Individual Therapy  Janey Genta 12/22/2021, 12:31 PM

## 2021-12-22 NOTE — IPOC Note (Signed)
Overall Plan of Care [facility]) Patient Details Name: [name] MRN: [id number] DOB: 1934/09/09  Admitting Diagnosis: Cerebellar stroke [facility])  Hospital Problems: Principal Problem:   Cerebellar stroke [facility])     Functional Problem List: Nursing Bladder, Bowel, Safety, Sensory, Edema, Endurance, Medication Management, Motor, Pain  PT Balance, Safety, Sensory, Edema, Endurance, Skin Integrity, Motor, Nutrition, Pain, Perception  OT Balance, Endurance, Motor, Pain, Perception, Safety, Vision  SLP    TR         Basic ADL's: OT Grooming, Bathing, Dressing, Toileting     Advanced  ADL's: OT Simple Meal Preparation     Transfers: PT Bed Mobility, Bed to Chair, Car, Occupational psychologist, Research scientist (life sciences): PT Ambulation, Stairs     Additional Impairments: OT    SLP        TR      Anticipated Outcomes Item Anticipated Outcome  Self Feeding Independent  Swallowing      Basic self-care  Supervision  Toileting  Supervision   Bathroom Transfers Supervision  Bowel/Bladder  continent x 2  Transfers  supervision using LRAD  Locomotion  supervision using LRAD household  Communication     Cognition     Pain  less than 4  Safety/Judgment  remain fall free while in rehab   Therapy Plan: PT Intensity: Minimum of 1-2 x/day ,45 to 90 minutes PT Frequency: 5 out of 7 days PT Duration Estimated Length of Stay: ~2 weeks OT Intensity: Minimum of 1-2 x/day, 45 to 90 minutes OT Frequency: 5 out of 7 days OT Duration/Estimated Length of Stay: 12-16 days     Team Interventions: Nursing Interventions Patient/Family Education, Pain Management, Bladder Management, Medication Management, Discharge Planning, Bowel Management, Disease Management/Prevention  PT interventions Ambulation/gait training, Community reintegration, DME/adaptive equipment instruction, Neuromuscular re-education, Psychosocial support, Stair training, UE/LE Strength taining/ROM, Designer, jewellery, Discharge planning, Functional electrical stimulation, Pain management, Skin care/wound management, Therapeutic Activities, UE/LE Coordination activities, Cognitive remediation/compensation, Disease management/prevention, Functional mobility training, Patient/family education, Splinting/orthotics, Therapeutic Exercise, Visual/perceptual remediation/compensation  OT Interventions Balance/vestibular training, Discharge planning, Pain management, Self Care/advanced ADL retraining, Therapeutic Activities, UE/LE Coordination activities, Disease mangement/prevention, Functional mobility training, Patient/family education, Skin care/wound managment, Therapeutic Exercise, Visual/perceptual remediation/compensation, Firefighter, Fish farm manager, Neuromuscular re-education, Psychosocial support, UE/LE Strength taining/ROM, Wheelchair propulsion/positioning  SLP Interventions    TR Interventions    SW/CM Interventions Discharge Planning, Psychosocial Support, Patient/Family Education, Disease Management/Prevention   Barriers to Discharge MD  Incontinence  Nursing Decreased caregiver support, Home environment access/layout 1 level 3 ste rails on right/legt  PT Decreased caregiver support, Home environment access/layout, Neurogenic Bowel & Bladder    OT      SLP      SW Lack of/limited family support, Decreased caregiver support, Community education officer for SNF coverage     Team Discharge Planning: Destination: PT-Home ,OT- Home , SLP-  Projected Follow-up: PT-Home health PT, 24 hour supervision/assistance, OT-  24 hour supervision/assistance, Home health OT, SLP-  Projected Equipment Needs: PT-To be determined, OT- To be determined, SLP-  Equipment Details: PT- , OT-Pt may need BSC, TBD Patient/family involved in discharge planning: PT- Patient,  OT-Patient, SLP-   MD ELOS: 12-16d Medical Rehab Prognosis:  Good Assessment: The patient has been admitted for CIR therapies  with the diagnosis of cerebellar stroke. The team will be addressing functional mobility, strength, stamina, balance, safety, adaptive techniques and equipment, self-care, bowel and bladder mgt, patient and caregiver education, work on bowel and bladder continence. Goals  have been set at supervision. Anticipated discharge destination is home .        See Team Conference Notes for weekly updates to the plan of care

## 2021-12-22 NOTE — Progress Notes (Signed)
Physical Therapy Session Note  Patient Details  Name: [name] MRN: [id number] Date of Birth: [birth year]  Today's Date: 12/22/2021 PT Individual Time: 1101-1200 and 1300-1330 PT Individual Time Calculation (min): 59 min and 30 min  Short Term Goals: Week 1:  PT Short Term Goal 1 (Week 1): Pt will perform supine<>sit with supervision PT Short Term Goal 2 (Week 1): Pt will perform sit<>stands using LRAD with CGA PT Short Term Goal 3 (Week 1): Pt will perform bed<>chair transfers using LRAD with CGA PT Short Term Goal 4 (Week 1): Pt will ambulate at least 29ft using LRAD with CGA PT Short Term Goal 5 (Week 1): Pt will navigate 2 stairs in preparation for home entry with CGA  Skilled Therapeutic Interventions/Progress Updates:   First session:  Pt presents semi-reclined in bed and agreeable to therapy w/o getting OOB.  Pt states pain in B feet 10/10, but then states not as bad as standing.  Pt performed LE there ex including 3 x 15 AP, HS, abd/add, SLR w/ pain in B feet but R > L.  Pt states during session that pain is decreasing to R foot.  Pt performed supine chest press and shoulder flexion 3 x 10 w/ 2# weighted bar.  Pt requires rest breaks 2/2 pain.  Pt transferred sup to sit using siderail and CGA.  Pt states dizziness 4/10 on scale, but decreased to 2/10 w/ increased time.  Pt performed LAQ 3 x 10.  Pt returned to supine w/ supervision and sidrail.  Pt positions self in bed.  Bed alarm on and all needs in reach.  Second session:  Pt presents semi-reclined in bed finishing lunch.  Pt agreeable to therapy.  Pt appears to be moving feet much less gingerly than this morning, stating pain meds have been increased.  Pt transferred sup to sit w/ supervision using side rails.  Pt states decreased lightheadedness this PM.  Pt sat EOB and performed throwing and catching beachball and volleyball using 1# weighted bar.  Pt requires seated rest breaks stating fatigue in shoulders.  Pt transferred sit to  stand w/ RW and mod A but decreased pain.  Pt stepped x 3 steps to [facility] w/ min A.  Pt transferred sit to supine w/ supervision and scoots to center of bed.  Bed alarm on and all needs in reach.     Therapy Documentation Precautions:  Precautions Precautions: Fall, Other (comment) Precaution Comments: dizziness, HOH (hears better in L ear) Restrictions Weight Bearing Restrictions: No General:   Vital Signs:  Pain:pt unable to quantify pain level when NWB, states <10/10. Pain Assessment Pain Scale: 0-10 Pain Score: 3  Faces Pain Scale: Hurts a little bit Pain Type: Acute pain Pain Location: Foot Pain Orientation: Right;Left Pain Descriptors / Indicators: Aching Pain Frequency: Intermittent Pain Intervention(s): Medication (See eMAR) PAINAD (Pain Assessment in Advanced Dementia) Breathing: normal Negative Vocalization: none Body Language: relaxed       Therapy/Group: Individual Therapy  Lucio Edward 12/22/2021, 12:14 PM

## 2021-12-22 NOTE — Progress Notes (Signed)
Orthopedic Tech Progress Note Patient Details:  [name] 19-Jun-1934 [id number]   MD requested ASO's for the ankle brace. The ASO was fitted to the pt and removed since it is to be worn only when OOB. They were left in the pt's room near the window.   Ortho Devices Type of Ortho Device: ASO Ortho Device/Splint Location: BLE Ortho Device/Splint Interventions: Ordered, Application, Adjustment   Post Interventions Patient Tolerated: Well Instructions Provided: Adjustment of device, Care of device  Georg Ruddle 12/22/2021, 1:43 PM

## 2021-12-23 DIAGNOSIS — I10 Essential (primary) hypertension: Secondary | ICD-10-CM

## 2021-12-23 DIAGNOSIS — M792 Neuralgia and neuritis, unspecified: Secondary | ICD-10-CM

## 2021-12-23 DIAGNOSIS — I482 Chronic atrial fibrillation, unspecified: Secondary | ICD-10-CM

## 2021-12-23 DIAGNOSIS — I639 Cerebral infarction, unspecified: Secondary | ICD-10-CM | POA: Diagnosis not present

## 2021-12-23 MED ORDER — LIDOCAINE 5 % EX PTCH
2.0000 | MEDICATED_PATCH | Freq: Every day | CUTANEOUS | Status: DC
  Administered 2021-12-23 – 2021-12-31 (×8): 2 via TRANSDERMAL
  Filled 2021-12-23 (×9): qty 2

## 2021-12-23 NOTE — Progress Notes (Signed)
Physical Therapy Session Note  Patient Details  Name: [name] MRN: [id number] Date of Birth: 09-10-1934  Today's Date: 12/23/2021 PT Individual Time: 1448-1530 PT Individual Time Calculation (min): 42 min   Short Term Goals: Week 1:  PT Short Term Goal 1 (Week 1): Pt will perform supine<>sit with supervision PT Short Term Goal 2 (Week 1): Pt will perform sit<>stands using LRAD with CGA PT Short Term Goal 3 (Week 1): Pt will perform bed<>chair transfers using LRAD with CGA PT Short Term Goal 4 (Week 1): Pt will ambulate at least 20ft using LRAD with CGA PT Short Term Goal 5 (Week 1): Pt will navigate 2 stairs in preparation for home entry with CGA  Skilled Therapeutic Interventions/Progress Updates: Pt presents semi-reclined in bed and agreeable to therapy.  Pt states feeling well after getting shower today!  Pt transfers sup to sit w/ supervision and verbal cues to complete scoot to EOB.  Pt sat EOB to place hearing aides.  Pt transfers throughout session from bed and w/c w/ min A.  Pt amb multiple trials of 56', 41' and 120' w/ RW and min A.  RW adjusted for height and pt able to perform reciprocal gait pattern.  Pt required seated rest breaks between trials 2/2 fatigue.  Pt transferred sit to supine w/ supervision and bridged to center of bed.  Bed alarm on and all needs in reach.     Therapy Documentation Precautions:  Precautions Precautions: Fall, Other (comment) Precaution Comments: dizziness, HOH (hears better in L ear) Restrictions Weight Bearing Restrictions: No General:   Vital Signs: Therapy Vitals Temp: 98.5 F (36.9 C) Temp Source: Oral Pulse Rate: (!) 57 Resp: 16 BP: (!) 142/53 Patient Position (if appropriate): Lying Oxygen Therapy SpO2: 96 % O2 Device: Room Air Pain: feet sore but not quantifying.       Therapy/Group: Individual Therapy  Lucio Edward 12/23/2021, 3:45 PM

## 2021-12-23 NOTE — Progress Notes (Addendum)
PROGRESS NOTE   Subjective/Complaints:  Pt doing well. Says that feet feel better with gabapentin adjustment. Using lidoderm on feet also. In good spirits.   ROS: Patient denies fever, rash, sore throat, blurred vision, dizziness, nausea, vomiting, diarrhea, cough, shortness of breath or chest pain,  back/neck pain, headache, or mood change.    Objective:   No results found. Recent Labs    12/21/21 0711  WBC 8.9  HGB 12.3  HCT 37.1  PLT 282   Recent Labs    12/20/21 1606 12/21/21 0711  NA 133* 135  K 4.7 4.7  CL 96* 98  CO2 23 25  GLUCOSE 120* 110*  BUN 21 22  CREATININE 1.04* 1.04*  CALCIUM 9.0 9.4    Intake/Output Summary (Last 24 hours) at 12/23/2021 0948 Last data filed at 12/22/2021 1348 Gross per 24 hour  Intake 397 ml  Output --  Net 397 ml        Physical Exam: Vital Signs Blood pressure 134/72, pulse (!) 52, temperature 97.6 F (36.4 C), temperature source Oral, resp. rate 18, weight 65.1 kg, SpO2 96 %.  Constitutional: No distress . Vital signs reviewed. HEENT: NCAT, EOMI, oral membranes moist Neck: supple Cardiovascular: RRR without murmur. No JVD    Respiratory/Chest: CTA Bilaterally without wheezes or rales. Normal effort    GI/Abdomen: BS +, non-tender, non-distended Ext: no clubbing, cyanosis, or edema Psych: pleasant and cooperative  Skin: No evidence of breakdown, no evidence of rash Neurologic: Cranial nerves II through XII intact, motor strength is 4/5 in bilateral deltoid, bicep, tricep, grip, hip flexor, knee extensors, ankle dorsiflexor and plantar flexor  Cerebellar exam normal finger to nose to finger as well in bilateral upper  Musculoskeletal: Full range of motion in all 4 extremities. No joint swelling, pain to palp RIght patellar , no foot tenderness  or swelling ,feet less tender with palpation/ROM today    Assessment/Plan: 1. Functional deficits which require 3+  hours per day of interdisciplinary therapy in a comprehensive inpatient rehab setting. Physiatrist is providing close team supervision and 24 hour management of active medical problems listed below. Physiatrist and rehab team continue to assess barriers to discharge/monitor patient progress toward functional and medical goals  Care Tool:  Bathing    Body parts bathed by patient: Right arm, Left arm, Chest, Abdomen, Front perineal area, Face, Left upper leg, Right upper leg   Body parts bathed by helper: Buttocks, Left lower leg, Right lower leg     Bathing assist Assist Level: Moderate Assistance - Patient 50 - 74%     Upper Body Dressing/Undressing Upper body dressing   What is the patient wearing?: Pull over shirt    Upper body assist Assist Level: Minimal Assistance - Patient > 75%    Lower Body Dressing/Undressing Lower body dressing      What is the patient wearing?: Incontinence brief, Pants     Lower body assist Assist for lower body dressing: Moderate Assistance - Patient 50 - 74%     Toileting Toileting    Toileting assist Assist for toileting: Moderate Assistance - Patient 50 - 74%     Transfers Chair/bed transfer  Transfers assist  Chair/bed transfer assist level: Moderate Assistance - Patient 50 - 74%     Locomotion Ambulation   Ambulation assist   Ambulation activity did not occur: Safety/medical concerns          Walk 10 feet activity   Assist  Walk 10 feet activity did not occur: Safety/medical concerns        Walk 50 feet activity   Assist Walk 50 feet with 2 turns activity did not occur: Safety/medical concerns         Walk 150 feet activity   Assist Walk 150 feet activity did not occur: Safety/medical concerns         Walk 10 feet on uneven surface  activity   Assist Walk 10 feet on uneven surfaces activity did not occur: Safety/medical concerns         Wheelchair     Assist Is the patient using  a wheelchair?: Yes Type of Wheelchair: Manual    Wheelchair assist level: Dependent - Patient 0%      Wheelchair 50 feet with 2 turns activity    Assist        Assist Level: Dependent - Patient 0%   Wheelchair 150 feet activity     Assist      Assist Level: Dependent - Patient 0%   Blood pressure 134/72, pulse (!) 52, temperature 97.6 F (36.4 C), temperature source Oral, resp. rate 18, weight 65.1 kg, SpO2 96 %.  Medical Problem List and Plan: 1. Functional deficits secondary to small right cerebellar stroke.              -patient may shower             -ELOS/Goals: 12-16 days S            -Continue CIR therapies including PT, OT  -DVT/anticoagulation:  Pharmaceutical: Eliquis             -antiplatelet therapy: N/a 3. BLE neuropathy: will add low dose gabapentin Also with chronic low back pain , had injections in past in [place] which were "quite helpful" Xray lumbar sow severe DDD, spondylosis , LE pain may be related to Lumbar stenosis,   Also has bilateral acilles pain , R>L will order Bilateral ankle braces  12/2 feet better today with gabapentin increase. Continue -allow 2 lidocaine patches are being used on feet 4. Insomnia: continue melatonin and gabapentin 5. Neuropsych/cognition: This patient is capable of making decisions on her own behalf. 6. Skin/Wound Care: Routine pressure relief measures.  7. Fluids/Electrolytes/Nutrition: Monitor [email or web address] elevation of LFTs monitor CMET weekly, only received 650mg  of acetaminophen in last 3 d, may be related to crestor but has been on this for at least 2yr 8. A fib: Monitor HR TID--continue tenormin and Eliquis. HR controlled 12/2 (actually bradycardic) 9. HTN: Monitor BP TID- continue Amlodipine, Cozaar and tenormin.  Vitals:   12/22/21 1917 12/23/21 0410  BP: (!) 108/38 134/72  Pulse: (!) 55 (!) 52  Resp: 18 18  Temp: 98.5 F (36.9 C) 97.6 F (36.4 C)  SpO2: 94% 96%   Controlled 12/2 10 AKI- has  resolved.  11. Bilateral hydronephrosis on KUB: Will repeat KUB. Monitor voiding with PVR/bladder scans.              --d/c ditropan.              --Neurogenic bladder? As has hx of lumbar spondylosis with impingement and recent fall PVR pending F/u KUB shows bladder decompressed , kidney fxn  normal , prior KUB has evidence of residual contrast showing dilatation of renal calyces, Normal kidney US 12. Constipation:+BM 12/2 13. Recurrent hypokalemia: Supplemented X 1 today.     Latest Ref Rng & Units 12/21/2021    7:11 AM 12/20/2021    4:06 PM 12/19/2021    8:19 AM  BMP  Glucose 70 - 99 mg/dL 101  751  025   BUN 8 - 23 mg/dL 22  21  17    Creatinine 0.44 - 1.00 mg/dL  8.52  7.78   Sodium 135 - 145 mmol/L 135  133  133   Potassium 3.5 - 5.1 mmol/L 4.7  4.7  3.2   Chloride 98 - 111 mmol/L 98  96  97   CO2 22 - 32 mmol/L 25  23  24    Calcium 8.9 - 10.3 mg/dL 9.4  9.0  8.8   K+ wnl 11/30  14. H/o Depression/anxiety: continue Zoloft and ativan prn       LOS: 3 days A FACE TO FACE EVALUATION WAS PERFORMED  12/23/2021, 9:48 AM

## 2021-12-23 NOTE — Progress Notes (Signed)
Physical Therapy Session Note  Patient Details  Name: [name] MRN: [id number] Date of Birth: 1934/12/07  Today's Date: 12/23/2021 PT Individual Time:0800  - 0900; 60 min     Short Term Goals: Week 1:  PT Short Term Goal 1 (Week 1): Pt will perform supine<>sit with supervision PT Short Term Goal 2 (Week 1): Pt will perform sit<>stands using LRAD with CGA PT Short Term Goal 3 (Week 1): Pt will perform bed<>chair transfers using LRAD with CGA PT Short Term Goal 4 (Week 1): Pt will ambulate at least 55ft using LRAD with CGA PT Short Term Goal 5 (Week 1): Pt will navigate 2 stairs in preparation for home entry with CGA  Skilled Therapeutic Interventions/Progress Updates:    Pt received propped up in bed, finishing breakfast.  She was unable to open her container of pears and asked for help.  Pt denied pain.  Pt placed hearing aids with min assist.  With HOB raised, supine> sit to R with close supervision.    neuromuscular re-education via demo and multimodal cues for R/L long arc quad knee extensions with ankle pumps at end range, and R/ L hip flexion, seated in preparation for gait training.  Sir> stand with CGA.  Gait training on level tile in room, RW, x 20' with CGA.  Toilet transfer with min assist.  Pt managed clothing with CGA in standing. Pt urinated very slowly and intermittently. Pt had very small BM.  Peri care with mod assist in standing.  Hand washing at sink from wc level with set up.  After gait in room, pt c/o R ankle pain.  PT donned R ankle brace.  Bil ankles with erythema.    PT informed [name], RN about bil ankles and voiding difficulties.  Gait training iwht Rw on level tile x 80' with multiple turns, CGA and cues for sequencing.  Stand> sit iwht min assist.    Use of 2# weighted bar for beach ball volleys, x 5 before pt reported that her bil UEs were too fatigued.  Sit> supine with supervision.  At conclusion of session, pt resting in bed with alarm set and needs at  hand.     Therapy Documentation Precautions:  Precautions Precautions: Fall, Other (comment) Precaution Comments: dizziness, HOH (hears better in L ear) Restrictions Weight Bearing Restrictions: No          Therapy/Group: Individual Therapy  Emylee Decelle 12/23/2021, 7:44 AM

## 2021-12-23 NOTE — Progress Notes (Signed)
Occupational Therapy Session Note  Patient Details  Name: [name] MRN: [id number] Date of Birth: [birth year]  Today's Date: 12/23/2021 OT Individual Time: 0100-0145 OT Individual Time Calculation (min): 45 min    Short Term Goals: Week 1:  OT Short Term Goal 1 (Week 1): Pt will complete toileting with min A OT Short Term Goal 2 (Week 1): Pt will complete sit>stand in prep for BADLs CGA LRAD OT Short Term Goal 3 (Week 1): Pt will complete LB dressing with min A  Skilled Therapeutic Interventions/Progress Updates:   The pt was in bed upon arrival, the pt was able to transfer from supine to EOB with MinA, she was able to transfer from EOB to w/c with CGA.  The pt was transported to the shower via the w/c and was able to transfer from the w/c to the shower chiar with MinA using the grab bar.  The pt was able to doff her shirt with SBA and she was able to doff her brief and pants with MinA.  The pt was able to bathe her UB with s/u assist and MinA for her LB using the grab bar for additional balance during standing. The pt was able to wash her hair with s/u assist as well.  The pt was able to donn her over head top with s/u assist and MinA for donning her LB for threading her feet into the pant legs.  The pt returned to her room and was able to brush her teeth and comb her hair with s/u, she was able to return to bed level using the grab bars during functional mobility from w/c to EOB with CGA.  The pt was able to transfer from EOB to supine with CGA and MinA for bed mobility. The call light and bedside table were within reach and all additional needs were addressed.   Therapy Documentation Precautions:  Precautions Precautions: Fall, Other (comment) Precaution Comments: dizziness, HOH (hears better in L ear) Restrictions Weight Bearing Restrictions: No Therapy/Group: Individual Therapy  [name] 12/23/2021, 7:10 PM

## 2021-12-23 NOTE — Progress Notes (Signed)
Occupational Therapy Session Note  Patient Details  Name: [name] MRN: [id number] Date of Birth: [birth year]  Today's Date: 12/23/2021 OT Individual Time: 1000-1023 OT Individual Time Calculation (min): 23 min    Short Term Goals: Week 1:  OT Short Term Goal 1 (Week 1): Pt will complete toileting with min A OT Short Term Goal 2 (Week 1): Pt will complete sit>stand in prep for BADLs CGA LRAD OT Short Term Goal 3 (Week 1): Pt will complete LB dressing with min A  Skilled Therapeutic Interventions/Progress Updates: patient participated in OT as follows completed from recliner sit to stand in prep for functional activity = supervision.  She completed toilet transfer (via RW into her bathroom onto 3-1) = close supervision;   toileting = CGA and use of walker for 1 UE for balance once standing for self cleansing  .    Patient paused OT session to complete nursing procedure.  Afterwards she initially stated she wanted to take shower and changed her mind stating she was too cold  and too fatigued but did continue to participate in bed positioning and and endurance activities in bed.   She was left in bed with call bellwithin reach and bed alarm engaged.  Continue OT POC  Therapy Documentation Precautions:  Precautions Precautions: Fall, Other (comment) Precaution Comments: dizziness, HOH (hears better in L ear) Restrictions Weight Bearing Restrictions: No General: General OT Amount of Missed Time: 22 Minutes complaints of fatigue and to complete nursing care   Pain:denied   therapy/Group: Individual Therapy  Bud Face [facility] 12/23/2021, 5:53 PM

## 2021-12-25 DIAGNOSIS — I639 Cerebral infarction, unspecified: Secondary | ICD-10-CM | POA: Diagnosis not present

## 2021-12-25 LAB — COMPREHENSIVE METABOLIC PANEL
ALT: 88 U/L — ABNORMAL HIGH (ref 0–44)
AST: 71 U/L — ABNORMAL HIGH (ref 15–41)
Albumin: 2.8 g/dL — ABNORMAL LOW (ref 3.5–5.0)
Alkaline Phosphatase: 87 U/L (ref 38–126)
Anion gap: 11 (ref 5–15)
BUN: 19 mg/dL (ref 8–23)
CO2: 23 mmol/L (ref 22–32)
Calcium: 8.9 mg/dL (ref 8.9–10.3)
Chloride: 98 mmol/L (ref 98–111)
Creatinine, Ser: 1.13 mg/dL — ABNORMAL HIGH (ref 0.44–1.00)
GFR, Estimated: 48 mL/min — ABNORMAL LOW (ref >60)
Glucose, Bld: 111 mg/dL — ABNORMAL HIGH (ref 70–99)
Potassium: 5 mmol/L (ref 3.5–5.1)
Sodium: 132 mmol/L — ABNORMAL LOW (ref 135–145)
Total Bilirubin: 0.7 mg/dL (ref 0.3–1.2)
Total Protein: 6.7 g/dL (ref 6.5–8.1)

## 2021-12-25 MED ORDER — AMLODIPINE BESYLATE 5 MG PO TABS
5.0000 mg | ORAL_TABLET | Freq: Every day | ORAL | Status: DC
  Administered 2021-12-26 – 2022-01-01 (×6): 5 mg via ORAL
  Filled 2021-12-25 (×7): qty 1

## 2021-12-25 NOTE — Progress Notes (Signed)
Physical Therapy Session Note  Patient Details  Name: [name] MRN: [id number] Date of Birth: [birth year]  Today's Date: 12/25/2021 PT Individual Time: 0900-0930 PT Individual Time Calculation (min): 30 min   Short Term Goals: Week 1:  PT Short Term Goal 1 (Week 1): Pt will perform supine<>sit with supervision PT Short Term Goal 2 (Week 1): Pt will perform sit<>stands using LRAD with CGA PT Short Term Goal 3 (Week 1): Pt will perform bed<>chair transfers using LRAD with CGA PT Short Term Goal 4 (Week 1): Pt will ambulate at least 109ft using LRAD with CGA PT Short Term Goal 5 (Week 1): Pt will navigate 2 stairs in preparation for home entry with CGA  Skilled Therapeutic Interventions/Progress Updates:      Therapy Documentation Precautions:  Precautions Precautions: Fall, Other (comment) Precaution Comments: dizziness, HOH (hears better in L ear) Restrictions Weight Bearing Restrictions: No  Pt agreeable to start vestibular evaluation. Pt without verbal complaints of pain in session. Pt reports dizziness onset occurred following cerebellar injury. Pt reports visual changes "flashing light", blurry vision, lightheadedness and nausea. Pt dizziness aggravated by transferring from sit to standing and improves when pt sits down. Pt reports symptoms last < 1 minute. Pt negative for orthostatic hypotension and VBI. Plan to follow up with further vestibular assessment Tuesday 12/5. Pt requires (S) with bed mobility and transfers in session with RW.   Orthostatic Vitals   -Supine: 125/53, 52   -Sit: 120/55, 53   -Standing ~1 minute: 116/47, 57   -Standing ~3 minutes: 120/71, 58    Therapy/Group: Individual Therapy  Truitt Leep Truitt Leep PT, DPT  12/25/2021, 7:44 AM

## 2021-12-25 NOTE — Progress Notes (Signed)
PROGRESS NOTE   Subjective/Complaints: Continues to have some dizziness with therapy. Discussed with OT who did brief vestibular assessment and felt more likely due to orthostasis.   ROS: Patient denies fever, rash, sore throat, blurred vision, dizziness, nausea, vomiting, diarrhea, cough, shortness of breath or chest pain,  back/neck pain, headache, or mood change. +dizziness   Objective:   No results found. No results for input(s): "WBC", "HGB", "HCT", "PLT" in the last 72 hours.  Recent Labs    12/25/21 0708  NA 132*  K 5.0  CL 98  CO2 23  GLUCOSE 111*  BUN 19  CREATININE 1.13*  CALCIUM 8.9    Intake/Output Summary (Last 24 hours) at 12/25/2021 1118 Last data filed at 12/25/2021 1000 Gross per 24 hour  Intake 854 ml  Output 2 ml  Net 852 ml        Physical Exam: Vital Signs Blood pressure (!) 132/51, pulse (!) 55, temperature 97.7 F (36.5 C), temperature source Oral, resp. rate 18, weight 65 kg, SpO2 92 %.  Constitutional: No distress . Vital signs reviewed. HEENT: NCAT, EOMI, oral membranes moist Neck: supple Cardiovascular: Bradycardia Respiratory/Chest: CTA Bilaterally without wheezes or rales. Normal effort    GI/Abdomen: BS +, non-tender, non-distended Ext: no clubbing, cyanosis, or edema Psych: pleasant and cooperative  Skin: No evidence of breakdown, no evidence of rash Neurologic: Cranial nerves II through XII intact, motor strength is 4/5 in bilateral deltoid, bicep, tricep, grip, hip flexor, knee extensors, ankle dorsiflexor and plantar flexor  Cerebellar exam normal finger to nose to finger as well in bilateral upper  Musculoskeletal: Full range of motion in all 4 extremities. No joint swelling, pain to palp RIght patellar , no foot tenderness  or swelling ,feet less tender with palpation/ROM today    Assessment/Plan: 1. Functional deficits which require 3+ hours per day of  interdisciplinary therapy in a comprehensive inpatient rehab setting. Physiatrist is providing close team supervision and 24 hour management of active medical problems listed below. Physiatrist and rehab team continue to assess barriers to discharge/monitor patient progress toward functional and medical goals  Care Tool:  Bathing    Body parts bathed by patient: Right arm, Left arm, Chest, Abdomen, Front perineal area, Face, Left upper leg, Right upper leg   Body parts bathed by helper: Buttocks, Left lower leg, Right lower leg     Bathing assist Assist Level: Moderate Assistance - Patient 50 - 74%     Upper Body Dressing/Undressing Upper body dressing   What is the patient wearing?: Pull over shirt    Upper body assist Assist Level: Minimal Assistance - Patient > 75%    Lower Body Dressing/Undressing Lower body dressing      What is the patient wearing?: Incontinence brief, Pants     Lower body assist Assist for lower body dressing: Moderate Assistance - Patient 50 - 74%     Toileting Toileting    Toileting assist Assist for toileting: Moderate Assistance - Patient 50 - 74%     Transfers Chair/bed transfer  Transfers assist     Chair/bed transfer assist level: Moderate Assistance - Patient 50 - 74%     Locomotion Ambulation  Ambulation assist   Ambulation activity did not occur: Safety/medical concerns  Assist level: Minimal Assistance - Patient > 75% Assistive device: Walker-rolling Max distance: 120   Walk 10 feet activity   Assist  Walk 10 feet activity did not occur: Safety/medical concerns  Assist level: Minimal Assistance - Patient > 75% Assistive device: Walker-rolling   Walk 50 feet activity   Assist Walk 50 feet with 2 turns activity did not occur: Safety/medical concerns  Assist level: Minimal Assistance - Patient > 75% Assistive device: Walker-rolling    Walk 150 feet activity   Assist Walk 150 feet activity did not occur:  Safety/medical concerns         Walk 10 feet on uneven surface  activity   Assist Walk 10 feet on uneven surfaces activity did not occur: Safety/medical concerns         Wheelchair     Assist Is the patient using a wheelchair?: Yes Type of Wheelchair: Manual    Wheelchair assist level: Dependent - Patient 0%      Wheelchair 50 feet with 2 turns activity    Assist        Assist Level: Dependent - Patient 0%   Wheelchair 150 feet activity     Assist      Assist Level: Dependent - Patient 0%   Blood pressure (!) 132/51, pulse (!) 55, temperature 97.7 F (36.5 C), temperature source Oral, resp. rate 18, weight 65 kg, SpO2 92 %.  Medical Problem List and Plan: 1. Functional deficits secondary to small right cerebellar stroke.              -patient may shower             -ELOS/Goals: 12-16 days S            Continue CIR therapies including PT, OT  -DVT/anticoagulation:  Pharmaceutical: Eliquis             -antiplatelet therapy: N/a 3. BLE neuropathy: will add low dose gabapentin Also with chronic low back pain , had injections in past in [place] which were "quite helpful" Xray lumbar sow severe DDD, spondylosis , LE pain may be related to Lumbar stenosis,   Also has bilateral acilles pain , R>L will order Bilateral ankle braces  12/2 feet better today with gabapentin increase. Continue -allow 2 lidocaine patches are being used on feet 4. Insomnia: continue melatonin and gabapentin 5. Neuropsych/cognition: This patient is capable of making decisions on her own behalf. 6. Skin/Wound Care: Routine pressure relief measures.  7. Fluids/Electrolytes/Nutrition: Monitor [email or web address] elevation of LFTs monitor CMET weekly, only received 650mg  of acetaminophen in last 3 d, may be related to crestor but has been on this for at least 61yr 8. A fib: Monitor HR TID--continue tenormin and Eliquis.  9. HTN: Monitor BP TID- continue Amlodipine, Cozaar and tenormin. Decrease  amlodipine to 5mg  given hypotension Vitals:   12/24/21 2316 12/25/21 0228  BP: (!) 123/52 (!) 132/51  Pulse: (!) 54 (!) 55  Resp: 16 18  Temp: 97.8 F (36.6 C) 97.7 F (36.5 C)  SpO2: 95% 92%   10 AKI- encouraged drinking 6-8 glasses of water per day  11. Bilateral hydronephrosis on KUB: Will repeat KUB. Monitor voiding with PVR/bladder scans.              --d/c ditropan.              --Neurogenic bladder? As has hx of lumbar spondylosis with impingement and recent fall  PVR pending F/u KUB shows bladder decompressed , kidney fxn normal , prior KUB has evidence of residual contrast showing dilatation of renal calyces, Normal kidney US 12. Constipation:+BM 12/2 13. Recurrent hypokalemia: Supplemented X 1 today.     Latest Ref Rng & Units 12/25/2021    7:08 AM 12/21/2021    7:11 AM 12/20/2021    4:06 PM  BMP  Glucose 70 - 99 mg/dL 127  517  001   BUN 8 - 23 mg/dL 19  22  21    Creatinine 0.44 - 1.00 mg/dL  7.49  4.49   Sodium 135 - 145 mmol/L 132  135  133   Potassium 3.5 - 5.1 mmol/L 5.0  4.7  4.7   Chloride 98 - 111 mmol/L 98  98  96   CO2 22 - 32 mmol/L 23  25  23    Calcium 8.9 - 10.3 mg/dL 8.9  9.4  9.0   K+ wnl 11/30  14. H/o Depression/anxiety: continue Zoloft and ativan prn 15. Orthostasis: encouraged patient to drink water in morning before trying to sit up 16. Hyponatremia: will liberalize to regular diet from heart healthy.        LOS: 5 days A FACE TO FACE EVALUATION WAS PERFORMED  [name] [name] 12/25/2021, 11:18 AM

## 2021-12-25 NOTE — Progress Notes (Signed)
Occupational Therapy Session Note  Patient Details  Name: [name] MRN: [id number] Date of Birth: 1934-10-29  Today's Date: 12/25/2021 OT Individual Time: 9326-7124 OT Individual Time Calculation (min): 73 min OT Individual Time: 1300-1415 OT Individual Time Calculation (min): 75 min    Short Term Goals: Week 1:  OT Short Term Goal 1 (Week 1): Pt will complete toileting with min A OT Short Term Goal 2 (Week 1): Pt will complete sit>stand in prep for BADLs CGA LRAD OT Short Term Goal 3 (Week 1): Pt will complete LB dressing with min A  Skilled Therapeutic Interventions/Progress Updates:     AM Session: Pt received resting in bed in good spirits and receptive to skilled OT session. Pt reporting 0/10 pain and mild soreness in BLEs. Pt reported he had been feeling nauseous and slightly dizzy during the night. Vitals monitored during session d/t previous report of dizziness:  Sitting up in bed BP 119/48 HR 55 O2 98% Sitting EOB BP 123/50 HR 53 O2 97% Pt reported she would like to take a shower during today's session. Pt sit>stand using RW CGA with mod verbal cueing for safety and body mechanics. Pt ambulated to bathroom using RW and transferred to elevated toilet seat CGA. Pt attempted to use restroom but no BM or void at this time. Pt ambulated to shower chair using RW CGA with mod verbal cues provided for safety using grab bars. Pt completed U/LB bathing seated on tub bench with close supervision and CGA when standing to clean bottom holding grab bars for balance. Following shower, Pt reported need to use restroom again ambulating to toilet with CGA using RW min cues for safety during transfer (continent void and small BM). Pt completed 3/3 toileting tasks with min A for posterior peri-care. Pt ambulated to sunk using RW and washed hands while standing at sink CGA for balance. Pt completed UB dressing set-up A and LB dressing GCA when standing to pull up pants. Pt reporting fatigue following  BADLs completing grooming sitting at sink d/t decreased standing tolerance. Pt stand step transfer using RW back to bed CGA. Pt was left resting in bed with call bell in reach, bed alarm on, and all needs met.   PM Session: Pt received resting in bed with niece, Jenny Reichmann, present in room. Pt in good spirits and motivated to participate in skilled OT session. Pt niece provided updates on Pt POC and current functional status. Pt niece reporting she will be able to provide Pt 24/7 care with assistance from her sister and DTR following d/c home.  Pt requesting to use restroom at beginning of session. Supine>EOB close supervision. Pt ambulated to bathroom using RW with CGA. Pt completed 3/3 toileting tasks on elevated toilet seat with close supervision (continent void). Pt ambulated to sink and washed her hands with CGA and transferred to her wc using RW CGA. Rn in/out to provide Pt medication.  Pt propelled herself in wc down hallway ~150 feet with min A to navigate around obstacles and two rest breaks d/t decreased activity tolerance.  Pt completed bell cancellation, trail making part B, and visual scanning single target BITS activities standing using RW with close supervision-CGA to maintain standing balance. Pt able to tolerate ~2-5 minutes of dynamic standing before needing rest break. Pt able to maintain balance with single UE support reaching across midline during activities close supervision. Pt completed sti>stands x3 using RW with education provided on body mechanic, technique, and safety with Pt demonstrating good tech back  requiring min verbal cues.  Pt reported soreness in her neck and was educated on gentle neck stretches. Pt complete 2x8 seconds of lateral neck flexion/neck rotation stretches and 2x5 reps of lateral/anterior neck rolls. Pt transported back to room total A in wc d/t fatigue and was left resting in recliner with call bell in reach, chair alarm on, and all needs met.   Therapy  Documentation Precautions:  Precautions Precautions: Fall, Other (comment) Precaution Comments: dizziness, HOH (hears better in L ear) Restrictions Weight Bearing Restrictions: No ADL: ADL Eating: Supervision/safety Where Assessed-Eating: Bed level Grooming: Supervision/safety Where Assessed-Grooming: Sitting at sink Upper Body Bathing: Supervision/safety Where Assessed-Upper Body Bathing: Shower Lower Body Bathing: Contact guard Where Assessed-Lower Body Bathing: Shower Upper Body Dressing: Setup Where Assessed-Upper Body Dressing: Edge of bed Lower Body Dressing: Contact guard Where Assessed-Lower Body Dressing: Edge of bed Toileting: Minimal assistance Where Assessed-Toileting: Bedside Commode Toilet Transfer: Close supervision Toilet Transfer Method: Counselling psychologist: Bedside commode, Energy manager: Curator Method: Heritage manager: Shower seat with back   Therapy/Group: Individual Therapy  Janey Genta 12/25/2021, 10:04 AM

## 2021-12-25 NOTE — Progress Notes (Signed)
Physical Therapy Session Note  Patient Details  Name: [name] MRN: [id number] Date of Birth: [birth year]  Today's Date: 12/25/2021 PT Individual Time: 1455-1530 PT Individual Time Calculation (min): 35 min   Short Term Goals: Week 1:  PT Short Term Goal 1 (Week 1): Pt will perform supine<>sit with supervision PT Short Term Goal 2 (Week 1): Pt will perform sit<>stands using LRAD with CGA PT Short Term Goal 3 (Week 1): Pt will perform bed<>chair transfers using LRAD with CGA PT Short Term Goal 4 (Week 1): Pt will ambulate at least 34ft using LRAD with CGA PT Short Term Goal 5 (Week 1): Pt will navigate 2 stairs in preparation for home entry with CGA  Skilled Therapeutic Interventions/Progress Updates:     Patient in recliner upon PT arrival. Patient alert and agreeable to PT session. Patient denied pain during session. NT arrived to perform bladder scan and vitals, stated they were overdue and couldn't wait. Patient missed 10 min of skilled PT due to nursing care, RN made aware. Will attempt to make-up missed time as able.    Therapeutic Activity: Transfers: Patient performed sit to/from stand x3 with close supervision using RW. Provided verbal cues for hand placement on RW and reaching back to sit for safety.  Gait Training:  Patient ambulated ~136 feet x2 using RW with CGA progressing to close supervision. Ambulated with decreased gait speed, decreased step length and height, narrow BOS, forward trunk lean, and downward head gaze. Provided verbal cues for erect posture, looking ahead, visual scanning with cues to look at pictures or signs on the walls, increased BOS, and increased step height.  Wheelchair Mobility:  Changed patient from a 20"x18" w/c to a 18"x16" w/c with foam cushion for improved sitting posture, sitting tolerance, and w/c propulsion. Adjusted B elevating leg rests to fit patient during session. Patient propelled wheelchair 89 feet with supervision. Provided verbal cues  for increased pressure through her L hand and equal stroke length to reduced L veering.   Patient in the bathroom for toileting with pull string in reach and NT made aware at end of session.   Therapy Documentation Precautions:  Precautions Precautions: Fall, Other (comment) Precaution Comments: dizziness, HOH (hears better in L ear) Restrictions Weight Bearing Restrictions: No    Therapy/Group: Individual Therapy  Lareina Espino L Naylene Foell PT, DPT, NCS, CBIS  12/25/2021, 3:48 PM

## 2021-12-25 NOTE — Progress Notes (Signed)
Patient felt nauseated tonight, compazine given with mild relief. Patient also c/o discomfort on her stomach. Maalox given. Vitals checked 97.7 132/51 55 16  92% ra. Checked on patient after few minutes and she said she is feeling much better. Will monitor.

## 2021-12-26 MED ORDER — ACETAMINOPHEN 325 MG PO TABS: 325.0000 mg | ORAL_TABLET | ORAL | Status: AC | PRN

## 2021-12-26 NOTE — Discharge Instructions (Addendum)
Inpatient Rehab Discharge Instructions  [name] Discharge date and time: No discharge date for patient encounter.   Activities/Precautions/ Functional Status: Activity: no lifting, driving, or strenuous exercise for till cleared by Md Diet: cardiac diet Wound Care: none needed   Functional status:  ___ No restrictions     ___ Walk up steps independently ___ 24/7 supervision/assistance   ___ Walk up steps with assistance ___ Intermittent supervision/assistance  ___ Bathe/dress independently ___ Walk with walker     ___ Bathe/dress with assistance ___ Walk Independently    ___ Shower independently ___ Walk with assistance    _X__ Shower with assistance _X__ No alcohol     ___ Return to work/school ________    Special Instructions:    COMMUNITY REFERRALS UPON DISCHARGE:    Outpatient: PT             Agency: [facility] [address] [place] [address] Phone:[phone]              Appointment Date/Time:WILL CALL TO SET UP APPOINTMENTS  Medical Equipment/Items Ordered:ROLLING WALKER                                                 Agency/Supplier:[facility]  [phone]    STROKE/TIA DISCHARGE INSTRUCTIONS SMOKING Cigarette smoking nearly doubles your risk of having a stroke & is the single most alterable risk factor  If you smoke or have smoked in the last 12 months, you are advised to quit smoking for your health. Most of the excess cardiovascular risk related to smoking disappears within a year of stopping. Ask you doctor about anti-smoking medications [place] Quit Line: 1-800-QUIT NOW Free Smoking Cessation Classes [phone]  CHOLESTEROL Know your levels; limit fat & cholesterol in your diet  Lipid Panel     Component Value Date/Time   CHOL 90 12/15/2021 0244   TRIG 103 12/15/2021 0244   HDL 42 12/15/2021 0244   CHOLHDL 2.1 12/15/2021 0244   VLDL 21 12/15/2021 0244   LDLCALC 27 12/15/2021 0244     Many patients benefit from  treatment even if their cholesterol is at goal. Goal: Total Cholesterol (CHOL) less than 160 Goal:  Triglycerides (TRIG) less than 150 Goal:  HDL greater than 40 Goal:  LDL (LDLCALC) less than 100   BLOOD PRESSURE American Stroke Association blood pressure target is less that 120/80 mm/Hg  Your discharge blood pressure is:  BP: (!) 135/54 Monitor your blood pressure Limit your salt and alcohol intake Many individuals will require more than one medication for high blood pressure  DIABETES (A1c is a blood sugar average for last 3 months) Goal HGBA1c is under 7% (HBGA1c is blood sugar average for last 3 months)  Diabetes: No known diagnosis of diabetes    Lab Results  Component Value Date   HGBA1C 5.7 (H) 12/15/2021    Your HGBA1c can be lowered with medications, healthy diet, and exercise. Check your blood sugar as directed by your physician Call your physician if you experience unexplained or low blood sugars.  PHYSICAL ACTIVITY/REHABILITATION Goal is 30 minutes at least 4 days per week  Activity: No driving, Therapies: see above Return to work: N/A Activity decreases your risk of heart attack and stroke and makes your heart stronger.  It helps control your weight and blood pressure; helps you relax  and can improve your mood. Participate in a regular exercise program. Talk with your doctor about the best form of exercise for you (dancing, walking, swimming, cycling).  DIET/WEIGHT Goal is to maintain a healthy weight  Your discharge diet is:  Diet Order             Diet regular Room service appropriate? Yes; Fluid consistency: Thin  Diet effective now                   liquids Your height is:  5'4.5" Your current weight is: Weight: 65 kg Your Body Mass Index (BMI) is:  BMI (Calculated): 24.23 Following the type of diet specifically designed for you will help prevent another stroke. You are at goal weight    Your goal Body Mass Index (BMI) is 19-24. Healthy food habits can  help reduce 3 risk factors for stroke:  High cholesterol, hypertension, and excess weight.  RESOURCES Stroke/Support Group:  Call [phone]   STROKE EDUCATION PROVIDED/REVIEWED AND GIVEN TO PATIENT Stroke warning signs and symptoms How to activate emergency medical system (call 911). Medications prescribed at discharge. Need for follow-up after discharge. Personal risk factors for stroke. Pneumonia vaccine given:  Flu vaccine given:  My questions have been answered, the writing is legible, and I understand these instructions.  I will adhere to these goals & educational materials that have been provided to me after my discharge from the hospital.     My questions have been answered and I understand these instructions. I will adhere to these goals and the provided educational materials after my discharge from the hospital.  Patient/Caregiver Signature _______________________________ Date __________  Clinician Signature _______________________________________ Date __________  Please bring this form and your medication list with you to all your follow-up doctor's appointments.

## 2021-12-26 NOTE — Progress Notes (Signed)
Occupational Therapy Session Note  Patient Details  Name: [name] MRN: [id number] Date of Birth: 09/12/1934  Today's Date: 12/26/2021 OT Individual Time: 0800-0900 OT Individual Time Calculation (min): 60 min  OT Individual Time: 1415-1445 OT Individual Time Calculation (min): 30 min    Short Term Goals: Week 1:  OT Short Term Goal 1 (Week 1): Pt will complete toileting with min A OT Short Term Goal 2 (Week 1): Pt will complete sit>stand in prep for BADLs CGA LRAD OT Short Term Goal 3 (Week 1): Pt will complete LB dressing with min A  Skilled Therapeutic Interventions/Progress Updates:     AM Session: Pt received resting in bed in good spirits and motivated to participate in skilled OT session. Pt reporting 0/10 pain and that she slept well last night. Pt politely denying need to use restroom or shower this AM. Focus this session BADL retraining, functional mobility, transfer training, and endurance training.  Pt requesting hearing aide upon OT arrival. Pt unaware of how to properly don hearing aide with education and demonstration provided. Pt able to don L hearing aide with min A for positioning, will continue to practice in preparation for dc home.   Pt supine>EOB supervision. Pt stand step transfer to wc using RW close supervision. Pt initially sitting down in wc unsafely- without reaching for wc hand rests, uncontrolled, and pulling back on RW. Pt re-educated on safety transferring with RW and instructed to complete sit>stand in safe manner demonstrating good teach back during task verbalizing and demonstrating correct hand placement and body mechanics.   Pt propelled self to sink to complete morning grooming tasks brushing her hair, brushing her teeth, and washing her face with set-up A. Pt inquiring about upcoming vestibular eval with skilled education and support provided.   Pt completed U/LB dressing sitting EOB with CGA and min verbal cues for safety when completing sit>stands  using RW when pulling pants to waist.   PM  Session: Pt received semi-reclined in bed in good spirits and motivated to participate in skilled OT session. Pt reporting 0/10 pain and mild fatigue following full day of therapy. Pt reporting need to use restroom at beginning of session. Pt supine>EOB mod I with HOB slightly elevated. Pt sit>stand>ambulated to bathroom using RW CGA. Pt completed 3/3 toileting tasks with close supervision. Pt ambulated to sink and washed her hands standing at sink with RW close supervision.   Pt completed therapeutic exercises EOB using yellow (light resistance) therband. Pt completed 1x10 reps of BUE shoulder external rotation, BUE shoulder rectraction, BUE chest pulls, and single arm shoulder flexion. Pt provided moderate verbal cues for body mechanics and technique during task. Pt reporting fatigue following activities. Pt returned to supine mod I from EOB and was left resting in bed with call bell in reach, bed alarm on, and all needs met.   Therapy Documentation Precautions:  Precautions Precautions: Fall, Other (comment) Precaution Comments: dizziness, HOH (hears better in L ear) Restrictions Weight Bearing Restrictions: No General:   Vital Signs: Therapy Vitals Temp: 98.2 F (36.8 C) Pulse Rate: (Abnormal) 53 Resp: 16 BP: (Abnormal) 135/54 Patient Position (if appropriate): Lying Oxygen Therapy SpO2: 94 % O2 Device: Room Air Pain: Pain Assessment Pain Scale: 0-10 Pain Score: Asleep Pain Type: Acute pain Pain Location: Back Pain Orientation: Lower Pain Radiating Towards: bilateral knees Pain Descriptors / Indicators: Aching Pain Frequency: Intermittent Pain Intervention(s): Medication (See eMAR) ADL: ADL Eating: Supervision/safety Where Assessed-Eating: Bed level Grooming: Supervision/safety Where Assessed-Grooming: Sitting at sink  Upper Body Bathing: Supervision/safety Where Assessed-Upper Body Bathing: Shower Lower Body Bathing: Contact  guard Where Assessed-Lower Body Bathing: Shower Upper Body Dressing: Setup Where Assessed-Upper Body Dressing: Edge of bed Lower Body Dressing: Contact guard Where Assessed-Lower Body Dressing: Edge of bed Toileting: Minimal assistance Where Assessed-Toileting: Bedside Commode Toilet Transfer: Close supervision Toilet Transfer Method: Counselling psychologist: Bedside commode, Energy manager: Curator Method: Heritage manager: Shower seat with back   Therapy/Group: Individual Therapy  Janey Genta 12/26/2021, 7:52 AM

## 2021-12-26 NOTE — Progress Notes (Signed)
Physical Therapy Session Note  Patient Details  Name: SHAUNTEE KARP MRN: [id number] Date of Birth: 24-Dec-1934  Today's Date: 12/26/2021 PT Individual Time: 1115-1208 PT Individual Time Calculation (min): 53 min   Short Term Goals: Week 1:  PT Short Term Goal 1 (Week 1): Pt will perform supine<>sit with supervision PT Short Term Goal 2 (Week 1): Pt will perform sit<>stands using LRAD with CGA PT Short Term Goal 3 (Week 1): Pt will perform bed<>chair transfers using LRAD with CGA PT Short Term Goal 4 (Week 1): Pt will ambulate at least 85ft using LRAD with CGA PT Short Term Goal 5 (Week 1): Pt will navigate 2 stairs in preparation for home entry with CGA  Skilled Therapeutic Interventions/Progress Updates:    Pt received supported up in bed and agreeable to therapy session. Reports her back is a little sore from the vestibular positioning maneuvers but no intervention needed for that at this time. Pt states she does not feel the soft ankle brace helps with her pain and declines wearing it during session. Pt reports her niece should be bringing her a different pair of shoes to wear during therapy.  Transitioned to sitting R EOB mod-I, HOB maximally elevated. Reports need to use bathroom. Sit<>stands using RW with CGA for safety/steadying during session. Gait in/out bathroom using RW with CGA for steadying. Standing performed LB clothing management without assist - close supervision for standing balance. Continent of bladder and very small BM - performed peri-care without assist.  Gait training ~124ft towards therapy gym using RW with CGA for safety and therapist providing w/c follow in event of fatigue - pt demos slow but controlled gait speed with short step lengths - has thoracic kyphosis with slight downward gaze. Transported remainder of distance to gym.  Gait training ~26ft using R HHA with light min assist for balance primarily noted when pt turning due to minor LOB/sway opposite of the  direction she is turning or rotating her head - pt reports feeling much more quickly fatigued when ambulating without AD as well as feeling more unstable.   Gait training additional 74ft using L HHA with pt continuing to demo same gait deviations as above with R/L lateral sway/instability.  Gait ~57ft to/from mat table with R HHA and light min assist.   Repeated sit<>stands to/from mat, no UE support, x8 reps with min assist progressed to CGA - cuing for increased anterior trunk flexion/weight shift and avoiding pushing backs of legs against mat as pt repeatedly has posterior LOB while rising to stand.   Standing balance challenge including horizontal head rotations and alternating arm swings with head rotaitons for internal perturbations x10 reps each.   Transported back to her room. Short distance ~18ft ambulatory transfer w/c>recliner using R HHA with light min assist. Pt left seated in recliner with needs in reach, seat belt alarm on, and B LEs elevated.    Therapy Documentation Precautions:  Precautions Precautions: Fall, Other (comment) Precaution Comments: dizziness, HOH (hears better in L ear) Restrictions Weight Bearing Restrictions: No   Pain: Denies pain at start of session but when participating in gait training without AD pt reports onset of L lateral hip pain/discomfort - provided seated rest breaks and modifications of interventions for pain management.     Therapy/Group: Individual Therapy  Ginny Forth , PT, DPT, NCS, CSRS 12/26/2021, 7:58 AM

## 2021-12-26 NOTE — Progress Notes (Signed)
Physical Therapy Session Note  Patient Details  Name: [name] MRN: [id number] Date of Birth: 12/13/1934   Today's Date: 12/26/2021 PT Individual Time: 0918-1016 PT Individual Time Calculation (min): 58 min   Short Term Goals: Week 1:  PT Short Term Goal 1 (Week 1): Pt will perform supine<>sit with supervision PT Short Term Goal 2 (Week 1): Pt will perform sit<>stands using LRAD with CGA PT Short Term Goal 3 (Week 1): Pt will perform bed<>chair transfers using LRAD with CGA PT Short Term Goal 4 (Week 1): Pt will ambulate at least 55ft using LRAD with CGA PT Short Term Goal 5 (Week 1): Pt will navigate 2 stairs in preparation for home entry with CGA  Skilled Therapeutic Interventions/Progress Updates:      Therapy Documentation Precautions:  Precautions Precautions: Fall, Other (comment) Precaution Comments: dizziness, HOH (hears better in L ear) Restrictions Weight Bearing Restrictions: No  Pt agreeable to continue vestibular assessment started previous day. Pt reports "vertigo" with right Gilberto Better and no nystagmus present. PT provided intervention via Epley maneuver for treatment. PT performed R Gilberto Better to re-test and pt asymptomatic and no nystagmus. PT provided education on BPPV and pt reports understanding. See flowsheet below for additional information regarding vestibular assessment.    Vestibular Assessment - 12/26/21 0001     Symptom Behavior           Subjective history of current problem onset of feeling unsteady sine cerebellar insult    Type of Dizziness  Imbalance    Frequency of Dizziness daily    Duration of Dizziness constant    Symptom Nature Constant    Aggravating Factors Sit to stand    Relieving Factors Lying supine    Progression of Symptoms No change since onset          Oculomotor Exam           Oculomotor Alignment Normal    Ocular ROM normal    Spontaneous Absent    Gaze-induced  Absent    Smooth Pursuits Intact    Saccades  Hypometric;Slow  bilaterally         Oculomotor Exam-Fixation Suppressed            Left Head Impulse absent nystagms reports HA    Right Head Impulse absent nystagmus reports HA          Positional Testing           Dix-Hallpike Dix-Hallpike Right;Dix-Hallpike Left          Dix-Hallpike Right           Dix-Hallpike Right Duration < 30 seconds    Dix-Hallpike Right Symptoms No nystagmus          Dix-Hallpike Left           Dix-Hallpike Left Duration negative    Dix-Hallpike Left Symptoms No nystagmus          Orthostatics           Orthostatics Comment Negative                    Therapy/Group: Individual Therapy  Truitt Leep Truitt Leep PT, DPT  12/26/2021, 7:43 AM

## 2021-12-26 NOTE — Progress Notes (Signed)
PROGRESS NOTE   Subjective/Complaints: Spoke with PT, vestibular eval in progress , othostatic BPs were reported as normal yesterday   ROS: Patient deniesCP SOB, N/V/D   Objective:   No results found. No results for input(s): "WBC", "HGB", "HCT", "PLT" in the last 72 hours.  Recent Labs    12/25/21 0708  NA 132*  K 5.0  CL 98  CO2 23  GLUCOSE 111*  BUN 19  CREATININE 1.13*  CALCIUM 8.9     Intake/Output Summary (Last 24 hours) at 12/26/2021 0918 Last data filed at 12/26/2021 0745 Gross per 24 hour  Intake 540 ml  Output --  Net 540 ml         Physical Exam: Vital Signs Blood pressure (!) 135/54, pulse (!) 53, temperature 98.2 F (36.8 C), resp. rate 16, weight 65 kg, SpO2 94 %.   General: No acute distress Mood and affect are appropriate Heart: Regular rate and rhythm no rubs murmurs or extra sounds Lungs: Clear to auscultation, breathing unlabored, no rales or wheezes Abdomen: Positive bowel sounds, soft nontender to palpation, nondistended Extremities: No clubbing, cyanosis, or edema Skin: No evidence of breakdown, no evidence of rash   Skin: No evidence of breakdown, no evidence of rash Neurologic: Cranial nerves II through XII intact, motor strength is 4/5 in bilateral deltoid, bicep, tricep, grip, hip flexor, knee extensors, ankle dorsiflexor and plantar flexor  Cerebellar exam normal finger to nose to finger as well in bilateral upper  Musculoskeletal: Full range of motion in all 4 extremities. No joint swelling, pain to palp RIght patellar , no foot tenderness  or swelling ,feet less tender with palpation/ROM today    Assessment/Plan: 1. Functional deficits which require 3+ hours per day of interdisciplinary therapy in a comprehensive inpatient rehab setting. Physiatrist is providing close team supervision and 24 hour management of active medical problems listed below. Physiatrist and rehab  team continue to assess barriers to discharge/monitor patient progress toward functional and medical goals  Care Tool:  Bathing    Body parts bathed by patient: Right arm, Left arm, Chest, Abdomen, Front perineal area, Face, Left upper leg, Right upper leg, Right lower leg, Left lower leg   Body parts bathed by helper: Buttocks, Left lower leg, Right lower leg     Bathing assist Assist Level: Contact Guard/Touching assist     Upper Body Dressing/Undressing Upper body dressing   What is the patient wearing?: Pull over shirt    Upper body assist Assist Level: Set up assist    Lower Body Dressing/Undressing Lower body dressing      What is the patient wearing?: Incontinence brief, Pants     Lower body assist Assist for lower body dressing: Contact Guard/Touching assist     Toileting Toileting    Toileting assist Assist for toileting: Minimal Assistance - Patient > 75%     Transfers Chair/bed transfer  Transfers assist     Chair/bed transfer assist level: Moderate Assistance - Patient 50 - 74%     Locomotion Ambulation   Ambulation assist   Ambulation activity did not occur: Safety/medical concerns  Assist level: Contact Guard/Touching assist Assistive device: Walker-rolling Max distance: 136 ft  Walk 10 feet activity   Assist  Walk 10 feet activity did not occur: Safety/medical concerns  Assist level: Contact Guard/Touching assist Assistive device: Walker-rolling   Walk 50 feet activity   Assist Walk 50 feet with 2 turns activity did not occur: Safety/medical concerns  Assist level: Minimal Assistance - Patient > 75% Assistive device: Walker-rolling    Walk 150 feet activity   Assist Walk 150 feet activity did not occur: Safety/medical concerns  Assist level: Contact Guard/Touching assist Assistive device: Walker-rolling    Walk 10 feet on uneven surface  activity   Assist Walk 10 feet on uneven surfaces activity did not occur:  Safety/medical concerns         Wheelchair     Assist Is the patient using a wheelchair?: Yes Type of Wheelchair: Manual    Wheelchair assist level: Supervision/Verbal cueing Max wheelchair distance: 89 ft    Wheelchair 50 feet with 2 turns activity    Assist        Assist Level: Supervision/Verbal cueing   Wheelchair 150 feet activity     Assist      Assist Level: Dependent - Patient 0%   Blood pressure (!) 135/54, pulse (!) 53, temperature 98.2 F (36.8 C), resp. rate 16, weight 65 kg, SpO2 94 %.  Medical Problem List and Plan: 1. Functional deficits secondary to small right cerebellar stroke.              -patient may shower             -ELOS/Goals: 12-16 days S            Continue CIR therapies including PT, OT  -DVT/anticoagulation:  Pharmaceutical: Eliquis             -antiplatelet therapy: N/a 3. BLE neuropathy: will add low dose gabapentin Also with chronic low back pain , had injections in past in [place] which were "quite helpful" Xray lumbar sow severe DDD, spondylosis , LE pain may be related to Lumbar stenosis,   Also has bilateral acilles pain , R>L will order Bilateral ankle braces  12/2 feet better today with gabapentin increase. Continue -allow 2 lidocaine patches are being used on feet 4. Insomnia: continue melatonin and gabapentin 5. Neuropsych/cognition: This patient is capable of making decisions on her own behalf. 6. Skin/Wound Care: Routine pressure relief measures.  7. Fluids/Electrolytes/Nutrition: Monitor [email or web address] elevation of LFTs monitor CMET weekly, only received 650mg  of acetaminophen in last 3 d, may be related to crestor but has been on this for at least 70yr 8. A fib: Monitor HR TID--continue tenormin and Eliquis.  9. HTN: Monitor BP TID- continue Amlodipine, Cozaar and tenormin. Decrease amlodipine to 5mg  given hypotension Vitals:   12/25/21 1944 12/26/21 0405  BP: (!) 144/54 (!) 135/54  Pulse: (!) 57 (!) 53  Resp: 16  16  Temp: 97.8 F (36.6 C) 98.2 F (36.8 C)  SpO2: 95% 94%   10 AKI- encouraged drinking 6-8 glasses of water per day  11. Bilateral hydronephrosis on KUB: Will repeat KUB. Monitor voiding with PVR/bladder scans.              --d/c ditropan.              --Neurogenic bladder? As has hx of lumbar spondylosis with impingement and recent fall PVR pending F/u KUB shows bladder decompressed , kidney fxn normal , prior KUB has evidence of residual contrast showing dilatation of renal calyces, Normal kidney 14/04/23 12. Constipation:+BM 12/2 13.  Recurrent hypokalemia: Supplemented X 1 today.     Latest Ref Rng & Units 12/25/2021    7:08 AM 12/21/2021    7:11 AM 12/20/2021    4:06 PM  BMP  Glucose 70 - 99 mg/dL 793  903  009   BUN 8 - 23 mg/dL 19  22  21    Creatinine 0.44 - 1.00 mg/dL  2.33  0.07   Sodium 135 - 145 mmol/L 132  135  133   Potassium 3.5 - 5.1 mmol/L 5.0  4.7  4.7   Chloride 98 - 111 mmol/L 98  98  96   CO2 22 - 32 mmol/L 23  25  23    Calcium 8.9 - 10.3 mg/dL 8.9  9.4  9.0   K+ wnl 12/4  14. H/o Depression/anxiety: continue Zoloft and ativan prn 15. Orthostasis: encouraged patient to drink water in morning before trying to sit up 16. Hyponatremia: will liberalize to regular diet from heart healthy.   17.  Dizziness does not appear to be BP related, vestibular eval in progress, if inner ear causes r/o ed then it is likely due to Cerebellar infarct      LOS: 6 days A FACE TO FACE EVALUATION WAS PERFORMED  12/26/2021, 9:18 AM

## 2021-12-27 LAB — GLUCOSE, CAPILLARY
Glucose-Capillary: 93 mg/dL (ref 70–99)
Glucose-Capillary: 88 mg/dL (ref 70–99)

## 2021-12-27 MED ORDER — DICLOFENAC SODIUM 1 % EX GEL
4.0000 g | Freq: Four times a day (QID) | CUTANEOUS | Status: DC
  Administered 2021-12-27 – 2021-12-31 (×12): 4 g via TOPICAL
  Filled 2021-12-27: qty 100

## 2021-12-27 NOTE — Progress Notes (Signed)
Physical Therapy Session Note  Patient Details  Name: [name] MRN: [id number] Date of Birth: October 02, 1934  Today's Date: 12/27/2021 PT Individual Time: 0801-0828 PT Individual Time Calculation (min): 27 min   Short Term Goals: Week 1:  PT Short Term Goal 1 (Week 1): Pt will perform supine<>sit with supervision PT Short Term Goal 2 (Week 1): Pt will perform sit<>stands using LRAD with CGA PT Short Term Goal 3 (Week 1): Pt will perform bed<>chair transfers using LRAD with CGA PT Short Term Goal 4 (Week 1): Pt will ambulate at least 15f using LRAD with CGA PT Short Term Goal 5 (Week 1): Pt will navigate 2 stairs in preparation for home entry with CGA  Skilled Therapeutic Interventions/Progress Updates:      Pt reading her book in bed on arrival - pleasant and agreeable to therapy session. Denies any pain. She requires assist in donning B [facility] aids[facility]   Supine<>sitting EOB with supervision, HOB elevated. Completed sit<>Stand to RW with supervision and stand<>pivot transfer to w/c with supervision and RW - appropriate safety awareness with turning to sit.   Transported in w/c for time management to ortho rehab gym. Completed ambulatory car transfer with supervision and RW, car height simulating a mini-van. Patient demonstrating understanding of safe entrance and exit of vehicle without physical assist. She reports she plans on returning to driving when she leaves, she's proud of her independence. Recommended she speak with MD prior to returning to driving for clearance. Practiced exiting the vehicle on driver side - no difficulty.   Practiced gait training on unlevel surfaces - using 825finclined ramp. Pt navigated up/down ramp with supervision assist and RW - cues for safety awareness and pacing self while descending the ramp.   Assisted onto the Nustep - completed for 3.5 minutes at L7 resistance to challenge cardiovascular endurance and strengthening -  used BUE/BLE combo. Achieved 156 steps in total and maintained a cadence around ~40 steps/minute.   Gait training back to her room from ortho rehab gym, ~10040fwith supervision assist and RW - cues for upright posture, forward gaze, and keeping body within walker frame. A few minor LOB with distractions/head turns that patient self corrected with stepping strategies.   Concluded session seated EOB, alarm on, call bell in reach. Pt aware of her upcoming therapy schedule. All needs met.  Therapy Documentation Precautions:  Precautions Precautions: Fall, Other (comment) Precaution Comments: dizziness, HOH (hears better in L ear) Restrictions Weight Bearing Restrictions: No General:    Therapy/Group: Individual Therapy  ChrAlger Simons/06/2021, 7:39 AM

## 2021-12-27 NOTE — Progress Notes (Addendum)
Patient ID: [name], female   DOB: [birth year], 87 y.o.   MRN: [id number]  Team Conference Report to Patient/Family  Team Conference discussion was reviewed with the patient and caregiver, including goals, any changes in plan of care and target discharge date.  Patient and caregiver express understanding and are in agreement.  The patient has a target discharge date of 12/30/21.  SW met with patient in room to provide conference updates. Patient happy. Niece will be at bedside after 3PM per patient. Sw will follow up with niece to discuss updates and arrange family education.   3:45 PM:  SW spoke with patient niece, [name]. Niece would like to request an extension until Monday due to repairing patients door. SW will follow up with physician. Family will call back to arrange education.  Dyanne Iha 12/27/2021, 1:28 PM

## 2021-12-27 NOTE — Progress Notes (Signed)
Patient ID: [name], female   DOB: 1934/07/07, 87 y.o.   MRN: [id number]  Rw ordered through [employer] per niece request

## 2021-12-27 NOTE — Patient Care Conference (Signed)
Inpatient RehabilitationTeam Conference and Plan of Care Update Date: 12/27/2021   Time: 10:47 AM    Patient Name: [name]      Medical Record Number: [id number]  Date of Birth: 1934/09/08 Sex: Female         Room/Bed: 4M11C/4M11C-01 Payor Info: Payor: [employer] MEDICARE [employer]VANTAGE / Plan: [employer] MEDICARE ADVANTAGE / Product Type: *No Product type* /    Admit Date/Time:  12/20/2021  3:09 PM  Primary Diagnosis:  Cerebellar stroke [facility])  Hospital Problems: Principal Problem:   Cerebellar stroke [facility])    Expected Discharge Date: Expected Discharge Date: 12/30/21  Team Members Present: Physician leading conference: Dr. Claudette Laws Social Worker Present: Lavera Guise, BSW Nurse Present: Chana Bode, RN PT Present: Casimiro Needle, PT OT Present: Other (comment) Bonnell Public, OT) SLP Present: Eilene Ghazi, SLP PPS Coordinator present : Fae Pippin, SLP     Current Status/Progress Goal Weekly Team Focus  Bowel/Bladder   Patient is continent of B/B, LBM 12/27/21   Maintain continence   Assess toileting needs QS/PRN    Swallow/Nutrition/ Hydration               ADL's   Sit>stand w/RW CGA, toilet and shower transfers CGA, U/LB dressing and bathing Supervision, grooming set-up A; Pt limited by decreased activity tolerance and decrease strenght   Supervision   activity tolerance, BADL retraing, UB strengthing, balance, endurance training, safety awareness education, transfer training, pt education    Mobility   supine<>sit supervision (pt progressing to tolerating flat HOB), sit<>stand and stand pivot transfers using RW CGA, gait up to 13ft using RW with CGA, 4 stairs using B HRs with CGA, and vestibular evaluation and treatment   mod-I transfers, supervision gait using LRAD  activity tolerance, vestibular assessment and treatment, transfer training, gait training, stair navigation training, DME training, pt education    Communication                 Safety/Cognition/ Behavioral Observations               Pain     Pain in feet addressed  <2   Assess , need for and effectiveness of medications and other interventions   Skin   Skin intact   Maintain skin integrity  Continue to assess skin QS/PRN      Discharge Planning:  Patient discharging home with assistance  from 2 nieces and daughter. Family anticipates rotating supervision.   Team Discussion: Patient with bil foot pain; addressed with medication, braces; may need bil CAM boots; MD added Voltaren gel and Neurontin. Vestibular eval completed with symptomatic treatment and plan to reassess if symptoms re-occur.  Patient on target to meet rehab goals: yes, currently needs close supervision for showering and set up for dressing. Needs supervision for bed mobility, CGA for sit - stand and stand pivots using a RW. Able to ambulate up to 130' with CGA and completes steps with bilateral rals with CGA. Goals for discharge set for supervision overall.  *See Care Plan and progress notes for long and short-term goals.   Revisions to Treatment Plan:  Shoes requested for therapy   Teaching Needs: Safety, medications, transfers, toileting, etc   Current Barriers to Discharge: Decreased caregiver support  Possible Resolutions to Barriers: Family education Supportive extended family HH follow up services DME: RW     Medical Summary Current Status: HOH, BP controlled , pain c/os in feet achilles +/- sciatica pain in LEs  Barriers to Discharge: Uncontrolled Pain  Possible Resolutions to [employer]: med adjustment add diclofenac gel to heels , discharge later in week, has home repairs ( replace entry door) prior to d/c   Continued Need for Acute Rehabilitation Level of Care: The patient requires daily medical management by a physician with specialized training in physical medicine and rehabilitation for the following reasons: Direction of a multidisciplinary  physical rehabilitation program to maximize functional independence : Yes Medical management of patient stability for increased activity during participation in an intensive rehabilitation regime.: Yes Analysis of laboratory values and/or radiology reports with any subsequent need for medication adjustment and/or medical intervention. : Yes   I attest that I was present, lead the team conference, and concur with the assessment and plan of the team.   Chana Bode B 12/27/2021, 2:20 PM

## 2021-12-27 NOTE — Progress Notes (Signed)
Physical Therapy Session Note  Patient Details  Name: [name] MRN: [id number] Date of Birth: [birth year]  Today's Date: 12/27/2021 PT Individual Time: 1110-1205 PT Individual Time Calculation (min): 55 min   Short Term Goals: Week 1:  PT Short Term Goal 1 (Week 1): Pt will perform supine<>sit with supervision PT Short Term Goal 2 (Week 1): Pt will perform sit<>stands using LRAD with CGA PT Short Term Goal 3 (Week 1): Pt will perform bed<>chair transfers using LRAD with CGA PT Short Term Goal 4 (Week 1): Pt will ambulate at least 82ft using LRAD with CGA PT Short Term Goal 5 (Week 1): Pt will navigate 2 stairs in preparation for home entry with CGA  Skilled Therapeutic Interventions/Progress Updates:    Pt received sitting in recliner and agreeable to therapy session. Pt's niece had brought in pt's shoes [employer]) and pt reports these are the shoes she wears all the time - min assist to get them on due to edema in feet. Therapist educated pt on importance of wearing shoes for skin protection and decreased fall risk a home. Sit<>stands using RW with supervision throughout session.  Gait training ~164ft to main therapy gym using RW with close supervision and pt with 1x minor R LOB when pt quickly looking up to the left, able to recover balance without hands-on assist - pt continues to have slow gait speed with decreased B LE step lengths and kyphotic posturing - therapist bringing w/c in event of fatigue but not needed.   Stair navigation training ascending/descending 4 steps x2 (6" height) using B HRs with CGA for safety - step-to pattern leading with R LE in each direction - no instability noted and pt with good control - therapist educated on pt's need for family member's assistance to move her RW to top/bottom of steps.  Dynamic gait training task with focus on head rotations to visually scan environment and turn while using RW to retrieve 1-10 numbered disks (ascending even #s then  odds) - pt able to sequence numbers without any cuing - maintains balance with CGA for safety but no LOB.   Supine<>sit on mat table supervision progressing to mod-I.   Due to continued L hip discomfort performed L hip stretches of single knee to chest, figure 4 with cross body adduction, and figure 4 with external rotation stretch. Pt reports no specific relief of discomfort and states she feels it is more that her L hip is sensitive to touch near greater trochanter (no visible bruising to that area).  Performed x15 reps of bridges with manual resistance into abduction for co-contraction - pt with at least 4/5 strength pushing into abduction in this hooklying positoin.   Gait training back towards her room using RW with close supervision, same gait deviations as above, pt made it ~171ft before requiring w/c due to fatigue.   Transported remainder of distance back to room. Gait ~74ft w/c>recliner using RW with supervision. Pt left seated in recliner with needs in reach, seat belt alarm on, and B LEs elevated.    Therapy Documentation Precautions:  Precautions Precautions: Fall, Other (comment) Precaution Comments: dizziness, HOH (hears better in L ear) Restrictions Weight Bearing Restrictions: No   Pain:  Continues to report L hip discomfort during gait but states it is "only a little."     Therapy/Group: Individual Therapy  Ginny Forth , PT, DPT, NCS, CSRS 12/27/2021, 7:56 AM

## 2021-12-27 NOTE — Progress Notes (Signed)
Occupational Therapy Weekly Progress Note  Patient Details  Name: [name] MRN: [id number] Date of Birth: 1934-09-03  Beginning of progress report period: December 21, 2021 End of progress report period: December 27, 2021  Today's Date: 12/27/2021 OT Individual Time: 0911-1009 OT Individual Time Calculation (min): 58 min  OT Individual Time: 1300-1415 OT Individual Time Calculation (min): 75 min    Patient has met 3 of 3 short term goals.  Pt is demonstrating increased independence in BADLs including completing UB dressing with supervision and LB dressing CGA when pulling pants to waist. Pt is completing sit>stands using RW with close supervision-CGA.   Patient continues to demonstrate the following deficits: muscle weakness, decreased cardiorespiratoy endurance, decreased coordination, and decreased sitting balance, decreased standing balance, and decreased balance strategies and therefore will continue to benefit from skilled OT intervention to enhance overall performance with BADL, iADL, and Reduce care partner burden.  Patient progressing toward long term goals..  Continue plan of care.  OT Short Term Goals Week 1:  OT Short Term Goal 1 (Week 1): Pt will complete toileting with min A OT Short Term Goal 1 - Progress (Week 1): Met OT Short Term Goal 2 (Week 1): Pt will complete sit>stand in prep for BADLs CGA LRAD OT Short Term Goal 2 - Progress (Week 1): Met OT Short Term Goal 3 (Week 1): Pt will complete LB dressing with min A OT Short Term Goal 3 - Progress (Week 1): Met Week 2:  OT Short Term Goal 1 (Week 2): STG=LTG (d/t Pt length of stay)  Skilled Therapeutic Interventions/Progress Updates:     AM Session: Pt received sitting up in bed reading book in good spirits and receptive to skilled OT session. Pt reporting 0/10 pain and requesting to take shower this AM. Focus this session BADL retraining, dynamic sitting balance, functional transfers, and safety awareness. Pt sitting  up in bed to EOB close supervision. Pt ambulated to bathroom using RW and transferred to toilet close supervision. Pt completed 3/3 toileting tasks close supervision, CGA to stand using grab bars. Pt ambulated to shower close supervision using RW and transferred to tub bench CGA min verbal cues for hand placement and safety. Pt completed UB bathing seated on tub bench supervision and LB bathing close supervision, CGA to standing while holding grab bars to wash peri-area. Following shower, Pt ambulated to EOB using RW and completed U/LB dressing seated EOB close supervision. Pt demonstrating improved safety awareness during sit<>stands using RW and often verbalizes sequence for transfer and hand placement while completing sit<>stand. Pt completed stand step transfer to wc using RW and completed grooming/oral hygiene tasks seated at sink supervision. Pt ambulated within her room to her chair using RW supervision with min verbal cues for body mechanics and transferred to her recliner supervision. Pt was left resting in recliner with seat belt alarm on, call bell in reach, and all needs met.   PM Session: Pt received resting in recliner finishing lunch in good spirits and receptive to skilled OT session. Pt reporting mild pain in L hip, politely denying need for pain medication- pt educated on LB stretches to decrease pain and positioned with pillow under hip to prevent pain at end of session. Focus this session BADL retraining, functional mobility, endurance training, dynamic standing balance, and coordination.   Pt reporting need to use restroom at beginning of session. Pt sit>stand ambulating to bathroom using RW CGA. Pt completed 3/3 toileting tasks with close supervision. Pt sit>stand using RW min  verbal cues for safety. Pt ambulated to sink using RW and washed hands CGA.   Pt ambulated ~149f to therapy gym using RW close supervision with in verbal cues for body mechanics and head positioning. Task graded up  with Pt instructed to turn head R and L to visual scan during ambulation with Pt able to maintain balance with close supervision to occasional CGA with rest break provided following task.   Pt completed dynamic standing balance and coordination activity at elevated table. Pt instructed to replicate pattern from picture using small pegs in peg board and remove them using tweezers. Pt able to maniple small pegs and recreate pattern with min verbal cues to notice mistake. Pt able to maintain dynamic standing balance ~10 minutes during activity.   Pt completed bean bag matching activity completing sti>stands x10 without AD prior to throwing bean bags. Pt provided moderate verbal cues for body mechanics, safety, and technique during sit<>stands. Pt able to maintain dynamic standing balance with CGA self-correcting SOB following anterior lean to throw bean bags and no LOB noted.   Pt ambulated ~100 feet back to room, but was unable to complete task d/t fatigue. Pt transported to room total A in wc. Pt completed stand step transfer back to bed using RW CGA and was left resting in bed with call bell in reach, bed alarm on, and all needs met.   Therapy Documentation Precautions:  Precautions Precautions: Fall, Other (comment) Precaution Comments: dizziness, HOH (hears better in L ear) Restrictions Weight Bearing Restrictions: No   ADL: ADL Eating: Supervision/safety Where Assessed-Eating: Bed level Grooming: Supervision/safety Where Assessed-Grooming: Sitting at sink Upper Body Bathing: Supervision/safety Where Assessed-Upper Body Bathing: Shower Lower Body Bathing: Contact guard Where Assessed-Lower Body Bathing: Shower Upper Body Dressing: Supervision/safety Where Assessed-Upper Body Dressing: Edge of bed Lower Body Dressing: Contact guard Where Assessed-Lower Body Dressing: Edge of bed Toileting: Supervision/safety Where Assessed-Toileting: TGlass blower/designer Close supervision THydrographic surveyorMethod: ACounselling psychologist GEmergency planning/management officerTransfer: CCuratorMethod: AHeritage manager SCivil engineer, contractingwith back   Therapy/Group: Individual Therapy  LJaney Genta12/06/2021, 9:58 AM

## 2021-12-27 NOTE — Progress Notes (Signed)
PROGRESS NOTE   Subjective/Complaints:   ROS: Patient deniesCP SOB, N/V/D   Objective:   No results found. No results for input(s): "WBC", "HGB", "HCT", "PLT" in the last 72 hours.  Recent Labs    12/25/21 0708  NA 132*  K 5.0  CL 98  CO2 23  GLUCOSE 111*  BUN 19  CREATININE 1.13*  CALCIUM 8.9     Intake/Output Summary (Last 24 hours) at 12/27/2021 0752 Last data filed at 12/27/2021 0300 Gross per 24 hour  Intake 780 ml  Output --  Net 780 ml         Physical Exam: Vital Signs Blood pressure (!) 120/51, pulse (!) 55, temperature (!) 97.5 F (36.4 C), temperature source Oral, resp. rate 16, weight 65 kg, SpO2 95 %.   General: No acute distress Mood and affect are appropriate Heart: Regular rate and rhythm no rubs murmurs or extra sounds Lungs: Clear to auscultation, breathing unlabored, no rales or wheezes Abdomen: Positive bowel sounds, soft nontender to palpation, nondistended Extremities: No clubbing, cyanosis, or edema Skin: No evidence of breakdown, no evidence of rash   Skin: No evidence of breakdown, no evidence of rash Neurologic: Cranial nerves II through XII intact, motor strength is 4/5 in bilateral deltoid, bicep, tricep, grip, hip flexor, knee extensors, ankle dorsiflexor and plantar flexor  Cerebellar exam normal finger to nose to finger as well in bilateral upper  Musculoskeletal: Full range of motion in all 4 extremities. No joint swelling, pain to palp RIght patellar , no foot tenderness  or swelling ,feet less tender with palpation/ROM today    Assessment/Plan: 1. Functional deficits which require 3+ hours per day of interdisciplinary therapy in a comprehensive inpatient rehab setting. Physiatrist is providing close team supervision and 24 hour management of active medical problems listed below. Physiatrist and rehab team continue to assess barriers to discharge/monitor patient  progress toward functional and medical goals  Care Tool:  Bathing    Body parts bathed by patient: Right arm, Left arm, Chest, Abdomen, Front perineal area, Face, Left upper leg, Right upper leg, Right lower leg, Left lower leg   Body parts bathed by helper: Buttocks, Left lower leg, Right lower leg     Bathing assist Assist Level: Contact Guard/Touching assist     Upper Body Dressing/Undressing Upper body dressing   What is the patient wearing?: Pull over shirt    Upper body assist Assist Level: Set up assist    Lower Body Dressing/Undressing Lower body dressing      What is the patient wearing?: Incontinence brief, Pants     Lower body assist Assist for lower body dressing: Contact Guard/Touching assist     Toileting Toileting    Toileting assist Assist for toileting: Minimal Assistance - Patient > 75%     Transfers Chair/bed transfer  Transfers assist     Chair/bed transfer assist level: Contact Guard/Touching assist Chair/bed transfer assistive device: Armrests, Walker   Locomotion Ambulation   Ambulation assist   Ambulation activity did not occur: Safety/medical concerns  Assist level: Contact Guard/Touching assist Assistive device: Walker-rolling Max distance: 136ft   Walk 10 feet activity   Assist  Walk 10  feet activity did not occur: Safety/medical concerns  Assist level: Contact Guard/Touching assist Assistive device: Walker-rolling   Walk 50 feet activity   Assist Walk 50 feet with 2 turns activity did not occur: Safety/medical concerns  Assist level: Minimal Assistance - Patient > 75% Assistive device: Walker-rolling    Walk 150 feet activity   Assist Walk 150 feet activity did not occur: Safety/medical concerns  Assist level: Contact Guard/Touching assist Assistive device: Walker-rolling    Walk 10 feet on uneven surface  activity   Assist Walk 10 feet on uneven surfaces activity did not occur: Safety/medical  concerns         Wheelchair     Assist Is the patient using a wheelchair?: Yes Type of Wheelchair: Manual    Wheelchair assist level: Supervision/Verbal cueing Max wheelchair distance: 89 ft    Wheelchair 50 feet with 2 turns activity    Assist        Assist Level: Supervision/Verbal cueing   Wheelchair 150 feet activity     Assist      Assist Level: Dependent - Patient 0%   Blood pressure (!) 120/51, pulse (!) 55, temperature (!) 97.5 F (36.4 C), temperature source Oral, resp. rate 16, weight 65 kg, SpO2 95 %.  Medical Problem List and Plan: 1. Functional deficits secondary to small right cerebellar stroke.              -patient may shower             -ELOS/Goals: 12-16 days S            Continue CIR therapies including PT, OT  -DVT/anticoagulation:  Pharmaceutical: Eliquis             -antiplatelet therapy: N/a 3. BLE neuropathy: will add low dose gabapentin Also with chronic low back pain , had injections in past in [place] which were "quite helpful" Xray lumbar sow severe DDD, spondylosis , LE pain may be related to Lumbar stenosis,   Also has bilateral achilles pain , R>L will order Bilateral ankle braces  Pt states braces not helpful may benefit from cam walker boot in this regard but too bulky given age and balance issues May have sciatic component to pain as well  4. Insomnia: continue melatonin and gabapentin 5. Neuropsych/cognition: This patient is capable of making decisions on her own behalf. 6. Skin/Wound Care: Routine pressure relief measures.  7. Fluids/Electrolytes/Nutrition: Monitor [email or web address] elevation of LFTs monitor CMET weekly, only received 650mg  of acetaminophen in last 3 d, may be related to crestor but has been on this for at least 40yr 8. A fib: Monitor HR TID--continue tenormin and Eliquis.  9. HTN: Monitor BP TID- continue Amlodipine, Cozaar and tenormin. Decrease amlodipine to 5mg  given hypotension Vitals:   12/27/21 0316  12/27/21 0356  BP: (!) 144/53 (!) 120/51  Pulse: (!) 58 (!) 55  Resp: 16 16  Temp: 98 F (36.7 C) (!) 97.5 F (36.4 C)  SpO2: 96% 95%   10 AKI- encouraged drinking 6-8 glasses of water per day  11. Bilateral hydronephrosis on KUB: Will repeat KUB. Monitor voiding with PVR/bladder scans.              --d/c ditropan.              --Neurogenic bladder? As has hx of lumbar spondylosis with impingement and recent fall PVR pending F/u KUB shows bladder decompressed , kidney fxn normal , prior KUB has evidence of residual contrast showing dilatation of  renal calyces, Normal kidney US 12. Constipation:+BM 12/2 13. Recurrent hypokalemia: Supplemented X 1 today.     Latest Ref Rng & Units 12/25/2021    7:08 AM 12/21/2021    7:11 AM 12/20/2021    4:06 PM  BMP  Glucose 70 - 99 mg/dL 161  096  045   BUN 8 - 23 mg/dL 19  22  21    Creatinine 0.44 - 1.00 mg/dL  4.09  8.11   Sodium 135 - 145 mmol/L 132  135  133   Potassium 3.5 - 5.1 mmol/L 5.0  4.7  4.7   Chloride 98 - 111 mmol/L 98  98  96   CO2 22 - 32 mmol/L 23  25  23    Calcium 8.9 - 10.3 mg/dL 8.9  9.4  9.0   K+ wnl 12/4  14. H/o Depression/anxiety: continue Zoloft and ativan prn 15. Orthostasis: encouraged patient to drink water in morning before trying to sit up 16. Hyponatremia: will liberalize to regular diet from heart healthy.   17.  Dizziness does not appear to be BP related, vestibular eval in progress, if inner ear causes r/o ed then it is likely due to Cerebellar infarct      LOS: 7 days A FACE TO FACE EVALUATION WAS PERFORMED  12/27/2021, 7:52 AM

## 2021-12-27 NOTE — Progress Notes (Signed)
Patient has been in no acute distress or noted discomfort, resting a frequent interval, states she was unaware she has a UTI because she has had no problems continue po Keflex Q6 hrs, consuming po liquids well. Lidocaine patches applied to feet due to discomfort especially with ambulating all day.states patient. Monitor and assisted prn. Continue regime

## 2021-12-28 MED ORDER — LORAZEPAM 0.5 MG PO TABS
0.5000 mg | ORAL_TABLET | Freq: Every day | ORAL | Status: DC
  Administered 2021-12-28 – 2021-12-31 (×4): 0.5 mg via ORAL
  Filled 2021-12-28 (×4): qty 1

## 2021-12-28 NOTE — Progress Notes (Signed)
Physical Therapy Session Note  Patient Details  Name: [name] MRN: [id number] Date of Birth: Nov 04, 1934  Today's Date: 12/28/2021 PT Individual Time: 0800-0830 PT Individual Time Calculation (min): 30 min   Short Term Goals: Week 1:  PT Short Term Goal 1 (Week 1): Pt will perform supine<>sit with supervision PT Short Term Goal 2 (Week 1): Pt will perform sit<>stands using LRAD with CGA PT Short Term Goal 3 (Week 1): Pt will perform bed<>chair transfers using LRAD with CGA PT Short Term Goal 4 (Week 1): Pt will ambulate at least 52ft using LRAD with CGA PT Short Term Goal 5 (Week 1): Pt will navigate 2 stairs in preparation for home entry with CGA  Skilled Therapeutic Interventions/Progress Updates:     Patient sitting EOB upon PT arrival. Patient alert and agreeable to PT session. Patient denied pain during session. Reports feeling "drained" this morning from poor sleep quality last night. Stated she discussed this with MD this morning. Requested minimal walking this morning due to fatigue level. Focused session on balance with functional transfers.   Patient performed stand pivot bed<>w/c and w/c<>mat table with CGA-supervision using RW with cues for forward weight shift and reaching back for controlled descent. Uncontrolled descent on first 2 trials.   Patient dependently transported to ortho gym for blocked practice transfer training.   Blocked practice sit to stand without AD x10 focused on forward weight shift for improved balance on ascending and descent.  Blocked practice sit to stand with RW x5 focused on hand placement with carry-over for forward weight shift from previous activity.  Patient with questions regarding previous OPPT. Unable to recall reason for OPPT or anything about these encounters. Per patient's chart patient participated in OPPT for cervicalgia in October 2022, reviewed notes, patient continues to report no recall of these sessions.   Patient performed sit  to supine independently in a flat bed without use of bed rails.   Patient in bed at end of session with breaks locked, bed alarm set, and all needs within reach.   Therapy Documentation Precautions:  Precautions Precautions: Fall, Other (comment) Precaution Comments: dizziness, HOH (hears better in L ear) Restrictions Weight Bearing Restrictions: No    Therapy/Group: Individual Therapy  Xuan Mateus L Ward Boissonneault PT, DPT, NCS, CBIS  12/28/2021, 4:32 PM

## 2021-12-28 NOTE — Progress Notes (Addendum)
Patient ID: [name], female   DOB: 04-27-1934, 87 y.o.   MRN: [id number]  Family education scheduled with patient granddaughter tomorrow 10-12. Patient will now discharge on 12/11.

## 2021-12-28 NOTE — Discharge Summary (Signed)
Physician Discharge Summary  Patient ID: [name] MRN: [id number] DOB/AGE: 1934-07-12 87 y.o.  Admit date: 12/20/2021 Discharge date: 01/01/2022  Discharge Diagnoses:  Principal Problem:   Cerebellar stroke [facility]) DVT prophylaxis Atrial fibrillation Hypertension AKI Bilateral hydronephrosis Constipation Mood stabilization Lumbar spondylosis COPD History of left CEA  Discharged Condition: Stable  Significant Diagnostic Studies: US RENAL  Result Date: 12/21/2021 CLINICAL DATA:  Hydronephrosis EXAM: RENAL / URINARY TRACT ULTRASOUND COMPLETE COMPARISON:  None available. FINDINGS: Right Kidney: Renal measurements: 8.6 x 3.8 x 4.5 cm = volume: 77.0 ML. No hydronephrosis. Increased renal cortical echogenicity. Left Kidney: Renal measurements: 9.6 x 4.2 x 3.8 cm = volume: 79.0 mL. No hydronephrosis. Increased renal cortical echogenicity. Bladder: Appears normal for degree of bladder distention. Other: None. IMPRESSION: No hydronephrosis. Increased renal cortical echogenicity, suggesting medical renal disease. Electronically Signed   By: Caprice Renshaw M.D.   On: 12/21/2021 09:03   DG Lumbar Spine 2-3 Views  Result Date: 12/20/2021 CLINICAL DATA:  Back pain for the past several days.  No known fall. EXAM: LUMBAR SPINE - 2-3 VIEW COMPARISON:  Lumbar spine CT 03/26/2018 FINDINGS: There are 5 non-rib-bearing lumbar-type vertebral bodies. Mild scoliotic curvature of the thoracolumbar spine with dominant cortical morning convex to the left measuring approximately 14 degrees (has been from the inferior endplate of T11 to the inferior endplate of L3). No anterolisthesis or retrolisthesis. Severe (greater than 75%) compression deformity involving the T12 vertebral body. Remaining lumbar vertebral body heights appear preserved. Moderate to severe multilevel lumbar spine DDD, worse at L4-L5 and L5-S1 with disc space height loss, endplate irregularity and sclerosis. Post cholecystectomy. Atherosclerotic  plaque within the abdominal aorta. IMPRESSION: 1. Age-indeterminate severe (greater than 75%) compression deformity of the T12 vertebral body. Correlation point tenderness at this location is advised. 2. Moderate to severe multilevel lumbar spine DDD, worse at L4-L5 and L5-S1 Electronically Signed   By: Simonne Come M.D.   On: 12/20/2021 18:59   DG Abd 1 View  Result Date: 12/20/2021 CLINICAL DATA:  Nausea and vomiting. EXAM: ABDOMEN - 1 VIEW COMPARISON:  12/14/2021 FINDINGS: Moderate colonic stool burden without evidence of enteric obstruction. Nondiagnostic evaluation for pneumoperitoneum. No definite pneumatosis or portal venous gas. Post cholecystectomy. Degenerative change of the lumbar spine and bilateral hips is suspected though incompletely evaluated. IMPRESSION: Moderate colonic stool burden without evidence of enteric obstruction. Electronically Signed   By: Simonne Come M.D.   On: 12/20/2021 18:56   ECHOCARDIOGRAM COMPLETE  Result Date: 12/15/2021    ECHOCARDIOGRAM REPORT   Patient Name:   [name] Date of Exam: 12/15/2021 Medical Rec #:  [id number]   Height:       64.5 in Accession #:    [id number]  Weight:       152.1 lb Date of Birth:  1934/03/10   BSA:          1.751 m Patient Age:    87 years    BP:           180/100 mmHg Patient Gender: F           HR:           62 bpm. Exam Location:  Inpatient Procedure: 2D Echo, Color Doppler and Cardiac Doppler Indications:    Atrial fibrillation  History:        Patient has prior history of Echocardiogram examinations, most                 recent 01/28/2017. CHF,  COPD, Arrythmias:Atrial Fibrillation; Risk                 Factors:Hypertension.  Sonographer:    Milda Smart Referring Phys: [id number] RONDELL A SMITH  Sonographer Comments: Technically difficult study due to poor echo windows. Image acquisition challenging due to patient body habitus and Image acquisition challenging due to respiratory motion. IMPRESSIONS  1. Left ventricular ejection  fraction, by estimation, is 55 to 60%. The left ventricle has normal function. The left ventricle has no regional wall motion abnormalities. Left ventricular diastolic parameters are consistent with Grade I diastolic dysfunction (impaired relaxation).  2. Right ventricular systolic function is normal. The right ventricular size is normal. There is normal pulmonary artery systolic pressure.  3. The mitral valve is normal in structure. Mild mitral valve regurgitation. No evidence of mitral stenosis.  4. The aortic valve is tricuspid. There is mild calcification of the aortic valve. Aortic valve regurgitation is trivial. No aortic stenosis is present.  5. The inferior vena cava is normal in size with greater than 50% respiratory variability, suggesting right atrial pressure of 3 mmHg. Comparison(s): MR improved from prior reporting. FINDINGS  Left Ventricle: Left ventricular ejection fraction, by estimation, is 55 to 60%. The left ventricle has normal function. The left ventricle has no regional wall motion abnormalities. The left ventricular internal cavity size was normal in size. There is  no left ventricular hypertrophy. Left ventricular diastolic parameters are consistent with Grade I diastolic dysfunction (impaired relaxation). Right Ventricle: The right ventricular size is normal. No increase in right ventricular wall thickness. Right ventricular systolic function is normal. There is normal pulmonary artery systolic pressure. The tricuspid regurgitant velocity is 2.41 m/s, and  with an assumed right atrial pressure of 3 mmHg, the estimated right ventricular systolic pressure is 26.2 mmHg. Left Atrium: Left atrial size was normal in size. Right Atrium: Right atrial size was normal in size. Pericardium: Trivial pericardial effusion is present. Presence of epicardial fat layer. Mitral Valve: The mitral valve is normal in structure. Mild mitral valve regurgitation. No evidence of mitral valve stenosis. Tricuspid  Valve: The tricuspid valve is normal in structure. Tricuspid valve regurgitation is mild . No evidence of tricuspid stenosis. Aortic Valve: The aortic valve is tricuspid. There is mild calcification of the aortic valve. There is mild aortic valve annular calcification. Aortic valve regurgitation is trivial. No aortic stenosis is present. Pulmonic Valve: The pulmonic valve was not well visualized. Pulmonic valve regurgitation is not visualized. No evidence of pulmonic stenosis. Aorta: The aortic root and ascending aorta are structurally normal, with no evidence of dilitation. Venous: The inferior vena cava is normal in size with greater than 50% respiratory variability, suggesting right atrial pressure of 3 mmHg. IAS/Shunts: No atrial level shunt detected by color flow Doppler.  LEFT VENTRICLE PLAX 2D LVIDd:         3.90 cm     Diastology LVIDs:         2.90 cm     LV e' medial:    3.75 cm/s LV PW:         0.90 cm     LV E/e' medial:  18.0 LV IVS:        0.90 cm     LV e' lateral:   6.23 cm/s LVOT diam:     1.70 cm     LV E/e' lateral: 10.8 LV SV:         45 LV SV Index:   26 LVOT Area:  2.27 cm  LV Volumes (MOD) LV vol d, MOD A2C: 91.6 ml LV vol d, MOD A4C: 92.2 ml LV vol s, MOD A2C: 39.8 ml LV vol s, MOD A4C: 46.4 ml LV SV MOD A2C:     51.8 ml LV SV MOD A4C:     92.2 ml LV SV MOD BP:      47.7 ml RIGHT VENTRICLE             IVC RV S prime:     10.10 cm/s  IVC diam: 1.30 cm TAPSE (M-mode): 1.6 cm LEFT ATRIUM             Index        RIGHT ATRIUM           Index LA diam:        3.20 cm 1.83 cm/m   RA Area:     14.60 cm LA Vol (A2C):   49.8 ml 28.44 ml/m  RA Volume:   36.00 ml  20.56 ml/m LA Vol (A4C):   44.7 ml 25.53 ml/m LA Biplane Vol: 50.1 ml 28.61 ml/m  AORTIC VALVE LVOT Vmax:   85.30 cm/s LVOT Vmean:  61.800 cm/s LVOT VTI:    0.200 m  AORTA Ao Root diam: 2.70 cm Ao Asc diam:  2.80 cm MITRAL VALVE               TRICUSPID VALVE MV Area (PHT): 2.38 cm    TR Peak grad:   23.2 mmHg MV Decel Time: 319 msec     TR Mean grad:   17.0 mmHg MR Peak grad: 100.4 mmHg   TR Vmax:        241.00 cm/s MR Vmax:      501.00 cm/s  TR Vmean:       196.0 cm/s MV E velocity: 67.50 cm/s MV A velocity: 82.50 cm/s  SHUNTS MV E/A ratio:  0.82        Systemic VTI:  0.20 m                            Systemic Diam: 1.70 cm Riley Lam MD Electronically signed by Riley Lam MD Signature Date/Time: 12/15/2021/12:33:15 PM    Final    DG ABD ACUTE 2+V W 1V CHEST  Result Date: 12/14/2021 CLINICAL DATA:  Nausea vomiting. EXAM: DG ABDOMEN ACUTE WITH 1 VIEW CHEST COMPARISON:  Chest x-ray of July 05, 2017. FINDINGS: EKG leads project over the chest. Cardiomediastinal contours and hilar structures are normal. Lungs are clear. No pneumothorax. No pleural effusion. No free air beneath either the RIGHT or LEFT hemidiaphragm. Moderate to marked RIGHT and moderate LEFT hydronephrosis as exhibited by excreted recently administered contrast material in the urinary tract. Urinary bladder is markedly distended and partially filled with dilute contrast media as well. No signs of bowel obstruction.  Post cholecystectomy. Near complete loss of height of T12. Age indeterminate but new since previous imaging. IMPRESSION: 1. No acute cardiopulmonary disease. 2. Findings that suggest bladder outlet obstruction or urinary retention as evidence by the marked distension of the urinary bladder and bilateral hydronephrosis RIGHT greater than LEFT. Correlate clinically and consider Foley catheter placement and are dedicated abdominal imaging. 3. T12 compression fracture suggestive of vertebra plana. Acuity uncertain. Correlate with any signs of pain in this area. This could be a cause for urinary retention. Electronically Signed   By: Donzetta Kohut M.D.   On: 12/14/2021 12:39  MR Brain Wo Contrast (neuro protocol)  Result Date: 12/14/2021 CLINICAL DATA:  Acute onset of severe dizziness and nausea. EXAM: MRI HEAD WITHOUT CONTRAST TECHNIQUE:  Multiplanar, multiecho pulse sequences of the brain and surrounding structures were obtained without intravenous contrast. COMPARISON:  CTA head and neck 12/14/2021 FINDINGS: Brain: No acute infarct, hemorrhage, or mass lesion is present. Mild atrophy and white matter changes are within normal limits for age. Deep brain nuclei are within normal limits. The ventricles are of normal size. No significant extraaxial fluid collection is present. The internal auditory canals are within normal limits. The brainstem and cerebellum are within normal limits. Vascular: Flow is present in the major intracranial arteries. Skull and upper cervical spine: The craniocervical junction is normal. Upper cervical spine is within normal limits. Marrow signal is unremarkable. Right supraorbital soft tissue swelling is new since the prior exam. Sinuses/Orbits: Minimal fluid is present in the mastoid air cells bilaterally. No obstructing nasopharyngeal lesion is present. The paranasal sinuses and mastoid air cells are otherwise clear. Bilateral lens replacements are noted. Globes and orbits are otherwise unremarkable. IMPRESSION: 1. Normal MRI appearance of the brain for age. No acute or focal lesion to explain the patient's symptoms. 2. Right supraorbital soft tissue swelling is new since the prior exam. Electronically Signed   By: Marin Roberts M.D.   On: 12/14/2021 09:31   CT ANGIO HEAD NECK W WO CM  Result Date: 12/14/2021 CLINICAL DATA:  87 year old female with dizziness and nausea since 0300 hours. EXAM: CT ANGIOGRAPHY HEAD AND NECK TECHNIQUE: Multidetector CT imaging of the head and neck was performed using the standard protocol during bolus administration of intravenous contrast. Multiplanar CT image reconstructions and MIPs were obtained to evaluate the vascular anatomy. Carotid stenosis measurements (when applicable) are obtained utilizing NASCET criteria, using the distal internal carotid diameter as the denominator.  RADIATION DOSE REDUCTION: This exam was performed according to the departmental dose-optimization program which includes automated exposure control, adjustment of the mA and/or kV according to patient size and/or use of iterative reconstruction technique. CONTRAST:  29mL OMNIPAQUE IOHEXOL 350 MG/ML SOLN COMPARISON:  Head CT 03/25/2018. FINDINGS: CT HEAD Brain: Cerebral volume remains within normal limits for age. No midline shift, mass effect, or evidence of intracranial mass lesion. No ventriculomegaly. No acute intracranial hemorrhage identified. Gray-white matter differentiation appears stable since 2020 and within normal limits for age throughout the brain. Calvarium and skull base: No acute osseous abnormality identified. Paranasal sinuses: Tympanic cavities, Visualized paranasal sinuses and mastoids are clear. Orbits: Mild rightward gaze. No other acute orbit or scalp soft tissue finding. CTA NECK Skeleton: Chronic severe craniocervical and atlantoaxial degeneration with bulky osteophytosis and subchondral erosions. Superimposed cervical facet ankylosis at C3-C4. Advanced lower cervical disc and endplate degeneration. No acute osseous abnormality identified. Upper chest: Emphysema. Visible upper thoracic esophagus is mildly distended with fluid and gas. Otherwise negative visible superior mediastinum. Other neck: Cervical esophagus mildly gas distended. Other neck soft tissue spaces appear within normal limits. Aortic arch: Porch wrists aortic arch. The entire arch is not included. Probable three-vessel configuration. Distal arch tortuosity, estimated at 33 mm diameter which is dilated but nonaneurysmal. Right carotid system: Visible brachiocephalic artery with mild plaque and no stenosis. Mildly tortuous right CCA origin with no plaque. Calcified plaque at the right carotid bifurcation primarily affects the ECA. Tortuous right ICA with a mildly beaded appearance (series 15, image 19), but no stenosis to the  skull base. Left carotid system: Left CCA origin not included. Left  CCA patent without stenosis proximal to the bifurcation. At the left carotid bifurcation there is complex plaque with combined 60% origin stenosis and evidence of ulcerated plaque or adherent thrombus in the proximal ICA bulb. See series 12, image 87. A carotid web might also explain this appearance, but the lesion has an unusual trefoil shape. Additional plaque at the left ICA bulb, but no other stenosis to the skull base. Vertebral arteries: Tortuous proximal right subclavian artery of mild plaque and no stenosis. Normal right vertebral artery origin. Tortuous right V1 segment. Tortuous right vertebral artery with no plaque or stenosis to the skull base. Proximal left subclavian artery soft and calcified plaque with mild stenosis. Normal left vertebral artery origin. Tortuous left vertebral artery is codominant and patent to the skull base with no plaque or stenosis. CTA HEAD Posterior circulation: Codominant distal vertebral arteries and patent vertebrobasilar junction without stenosis. Patent right PICA and dominant appearing left AICA origins. Patent basilar artery without stenosis. Patent SCA and PCA origins. Posterior communicating arteries are diminutive or absent. Bilateral PCA branches are within normal limits. Anterior circulation: Both ICA siphons are patent with tortuosity and mild to moderate calcified plaque, but only mild distal right cavernous segment stenosis. Patent carotid termini. Normal MCA and ACA origins. Normal anterior communicating artery and bilateral ACA branches. Left MCA M1 segment and trifurcation are patent without stenosis. Right MCA M1 segment and bifurcation are patent without stenosis. Bilateral MCA branches are within normal limits. Venous sinuses: Early contrast timing, not well evaluated. Anatomic variants: None. Review of the MIP images confirms the above findings IMPRESSION: 1. Negative for large vessel  occlusion, but Positive for complex plaque at the Left ICA origin where it's difficult to exclude ADHERENT THROMBUS within the vessel lumen (series 12, image 87, but alternatively this appearance might be ulcerated plaque and/or an unusual carotid web. Subsequent stenosis numerically estimated at 60%. 2. Positive also for evidence of cervical Right ICA Fibromuscular Dysplasia (FMD). 3. But otherwise mild for age atherosclerosis, and essentially negative intracranial anterior circulation with no other arterial stenosis identified. 4. Stable CT appearance of the brain, No acute intracranial abnormality. 5. Aortic Atherosclerosis (ICD10-I70.0) and Emphysema (ICD10-J43.9). Tortuous aortic arch. 6. 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 Electronically Signed   By: Odessa Fleming M.D.   On: 12/14/2021 08:42    Labs:  Basic Metabolic Panel: Recent Labs  Lab 12/25/21 0708  NA 132*  K 5.0  CL 98  CO2 23  GLUCOSE 111*  BUN 19  CREATININE 1.13*  CALCIUM 8.9    CBC: No results for input(s): "WBC", "NEUTROABS", "HGB", "HCT", "MCV", "PLT" in the last 168 hours.  CBG: Recent Labs  Lab 12/27/21 1214 12/27/21 1642  GLUCAP 93 88   Family history.  Mother with CAD Sister with abdominal aortic aneurysm cousin with breast cancer.  Denies any colon cancer esophageal cancer or rectal cancer  Brief HPI:   [name] is a 87 y.o. right-handed female with history of hypertension, PAF, left CEA, COPD, lumbar spondylosis, SBO.  Admitted 12/14/2021 with reports of dizziness and nausea with emesis and inability to get out of bed.  She also reported a fall a week prior to admission and then struck her head without any significant injuries.  She was noted to have atrial fibrillation with elevated blood pressure and potassium 2.8.  CTA head and neck negative for LVO and showed complex plaque left ICA origin with 60% stenosis and evidence of right ICA fibromuscular dysplasia.  MRI of the  brain showed normal brain for age and no acute abnormality.  Scan reviewed by neurology services felt that patient with small cerebellar infarct not seen on films likely embolic due to PAF.  She was started on Eliquis as patient agreeable.  Echo with ejection fraction of 55 to 60%.  KUB showed moderate marked and moderate left hydronephrosis with markedly distended bladder concerning for obstruction with a Foley tube recommended.  Also reported to have horizontal nystagmus with dizziness and movement due to vestibular dysfunction.  On 11/26 she developed A-fib with RVR rates of 170 treated with IV metoprolol.  Therapy evaluations completed due to patient decreased functional mobility was admitted for a comprehensive rehab program.   Hospital Course: [name] was admitted to rehab 12/20/2021 for inpatient therapies to consist of PT, ST and OT at least three hours five days a week. Past admission physiatrist, therapy team and rehab RN have worked together to provide customized collaborative inpatient rehab.  Pertaining to patient's right cerebellar CVA remained stable she remained on Eliquis therapy and would follow-up with neurology services.  Bilateral lower extremity neuropathy with the addition of gabapentin chronic back pain had injections in the past in [place].  X-rays lumbar showed severe DDD spondylosis.  Atrial fibrillation controlled remained on Tenormin as well as Eliquis.  Blood pressure soft and monitored will need outpatient follow-up.  AKI improved with latest creatinine 1.13.  Bilateral hydronephrosis on KUB initially on Ditropan that was discontinued.  Renal ultrasound follow-up showed no hydronephrosis.  Bouts of constipation resolved with laxative assistance.  Mood stabilization maintained on Zoloft with emotional support provided   Blood pressures were monitored on TID basis and soft and monitored     Rehab course: During patient's stay in rehab weekly team conferences were held to  monitor patient's progress, set goals and discuss barriers to discharge. At admission, patient required moderate assist 25 feet rolling walker moderate assist stand pivot transfers  Physical exam.  Blood pressure 144/50 pulse 72 temperature 97.8 respirations 18 oxygen saturation 95% room air Constitutional.  No acute distress HEENT Head.  Normocephalic and atraumatic Eyes.  Pupils round and reactive to light no discharge without nystagmus Neck.  Supple nontender no JVD without thyromegaly Cardiac regular rate and rhythm without any extra sounds or murmur heard Abdomen.  Soft nontender positive bowel sounds without rebound Respiratory effort normal no respiratory distress without wheeze Musculoskeletal.  Tight heel cords Neurologic.  Very hard of hearing.  Follows commands.  Inattention left upper extremity 5/5 strength throughout.  Sensation intact  He/She  has had improvement in activity tolerance, balance, postural control as well as ability to compensate for deficits. He/She has had improvement in functional use RUE/LUE  and RLE/LLE as well as improvement in awareness.  Sit to stand and transfers with rolling walker modified independent.  Amatory transfers to the bathroom with supervision.  Ambulates 150 feet x 3 rolling walker and supervision.  Up-and-down stairs with handrails at supervision level.  Patient completes full shower ADL at overall supervision level.  Requires minimal verbal cues for safety.  Lower body dressing included completing while seated versus standing and supervision.  Full family teaching completed plan discharge to home       Disposition: Discharge to home    Diet: Regular  Special Instructions: No driving smoking or alcohol  Medications at discharge 1.  Tylenol as needed 2.  Norvasc 5 mg p.o. daily 3.  Eliquis 5 mg p.o. twice daily 4.  Tenormin 12.5 mg p.o. daily 5.  Voltaren gel 4 g 4 times daily to affected area 6.  Neurontin 200 mg p.o. nightly 7.   Lidoderm patch change as directed 8.  Ativan 0.5 mg p.o. nightly 9.  Cozaar 100 mg p.o. daily 10.  Protonix 40 mg p.o. daily 11.  MiraLAX daily hold for loose stools 12.  Crestor 20 mg p.o. daily 13.  Zoloft 100 mg p.o. nightly  30-35 minutes were spent completing discharge summary and discharge planning  Discharge Instructions     Ambulatory referral to Neurology   Complete by: As directed    An appointment is requested in approximately: 4 weeks   Ambulatory referral to Physical Medicine Rehab   Complete by: As directed    Hospital follow up        Follow-up Information     Blair Heys, MD Follow up.   Specialty: Family Medicine Why: Call in 1-2 days for post hospital follow up Contact information: [address] [place] [address] [phone]         Erick Colace, MD Follow up.   Specialty: Physical Medicine and Rehabilitation Why: office will call you with follow up appointment Contact information: [address] [place] [address] [phone]         [facility] Follow up.   Why: office will call you with follow up appointment Contact information: [address]     [address] [place] [address] [phone]                Signed: Jacquelynn Cree 12/28/2021, 5:52 PM

## 2021-12-28 NOTE — Progress Notes (Signed)
Occupational Therapy Session Note  Patient Details  Name: MICHAEL VENTRESCA MRN: [id number] Date of Birth: 1935-01-08  Today's Date: 12/28/2021 OT Individual Time: 0900-1015 OT Individual Time Calculation (min): 75 min    Short Term Goals: Week 2:  OT Short Term Goal 1 (Week 2): STG=LTG (d/t Pt length of stay)  Skilled Therapeutic Interventions/Progress Updates:     Pt received sitting up in bed in good spirits and agreeable to skilled OT session. Pt reporting she did not sleep well last night and is feeling more tired today. Pt stating she has 0/10 pain. Pt vitals taken (charted in flowsheets) d/t Pt curious of how her BP was running this AM: BP 101/56 HR 56. Rn notified of Pt BP and HR running low. Rn in/out for medication management.   Pt transitioned from sitting up in bed to EOB close supervision. Pt completed UB dressing sitting EOB supervision and LB dressing CGA when pulling pants to waist in standing using RW. Pt demonstrating improved safety awareness, independently correcting hand placement during sit>stand during BALDs. Pt ambulated to sink using RW and completed grooming and oral hygiene tasks standing using RW with supervision.   Pt ambulated from room to therapy gym ~150 using RW with mod cues for body mechanics and safety.   Pt completed BITS activities standing with RW to address dynamic standing balance deficits and endurance training. TV positioning superior to Pt to facilitate improved body mechanics with Pt holding lifting head, reaching superior, and retraction shoulders to complete activities. Pt completed Visual Pursuits- rotator/sequence, Visual Motor-drawing/tracing, and maze test on BITS with rest breaks provided between tasks. Pt able to maintain standing balance with single arm support on RW 3-5 minutes with CGA.   Pt was transported back to room total A in wc d/t fatigue. Pt was left resting in bed with call bell in reach, bed alarm on, and all needs met.   Therapy  Documentation Precautions:  Precautions Precautions: Fall, Other (comment) Precaution Comments: dizziness, HOH (hears better in L ear) Restrictions Weight Bearing Restrictions: No General:   Vital Signs: Therapy Vitals Pulse Rate: (Abnormal) 56 BP: (Abnormal) 101/56 Pain:   ADL: ADL Eating: Supervision/safety Where Assessed-Eating: Bed level Grooming: Supervision/safety Where Assessed-Grooming: Sitting at sink Upper Body Bathing: Supervision/safety Where Assessed-Upper Body Bathing: Shower Lower Body Bathing: Contact guard Where Assessed-Lower Body Bathing: Shower Upper Body Dressing: Supervision/safety Where Assessed-Upper Body Dressing: Edge of bed Lower Body Dressing: Contact guard Where Assessed-Lower Body Dressing: Edge of bed Toileting: Supervision/safety Where Assessed-Toileting: Glass blower/designer: Close supervision Armed forces technical officer Method: Counselling psychologist: Emergency planning/management officer Transfer: Curator Method: Heritage manager: Civil engineer, contracting with back Research officer, political party   Exercises:   Other Treatments:     Therapy/Group: Individual Therapy  Janey Genta 12/28/2021, 9:28 AM

## 2021-12-28 NOTE — Progress Notes (Signed)
Physical Therapy Session Note  Patient Details  Name: [name] MRN: [id number] Date of Birth: [birth year]  Today's Date: 12/28/2021 PT Individual Time: 1306-1400 and 1540-1650 PT Individual Time Calculation (min): 54 min and 70 min  Short Term Goals: Week 1:  PT Short Term Goal 1 (Week 1): Pt will perform supine<>sit with supervision PT Short Term Goal 2 (Week 1): Pt will perform sit<>stands using LRAD with CGA PT Short Term Goal 3 (Week 1): Pt will perform bed<>chair transfers using LRAD with CGA PT Short Term Goal 4 (Week 1): Pt will ambulate at least 23ft using LRAD with CGA PT Short Term Goal 5 (Week 1): Pt will navigate 2 stairs in preparation for home entry with CGA  Skilled Therapeutic Interventions/Progress Updates:    Session 1: Pt received sitting on EOB having finished eating lunch and reporting "it's not my best day.Marland KitchenMarland KitchenI only slept 3 hrs last night but I feel better than I thought I would." Despite this, pt agreeable to therapy session.  Sit>stand EOB>RW with supervision for safety. Gait in/out bathroom using RW with close supervision for safety - pt managing door without LOB - moves slowly but controlled.   Pt reports gradual onset, over the last few hours, of having a "wavering" "clear" visual disturbance similar to what she experienced when the onset of her symptoms occurred at hospital admission - nurse made aware - pt with no additional symptoms and no apparent changes noted in her functional status.  Pt able to manage LB clothing without assist. Continent of bladder and performed peri-care without assist. Standing hand hygiene at sink without assist.  Transported to gym in w/c.  Assessed vitals: BP 143/127 (MAP 134), HR 99bpm  Re-assessed in same arm 2 minutes later: BP 131/47 (MAP 71), HR 56bpm   Gait training ~51ft using RW with close supervision and pt with 1x minor L LOB but able to perform stepping strategy and use RW to maintain balance without hands-on  assistance.   Reassessed vitals: BP 165/53 (MAP 83), HR 60bpm   Pt's family requested pt trial a rollator. Therapist provided visual demonstration and education on how to safely use rollator including the brakes. Pt participated in ~51ft of gait training using rollator and immediately upon starting to ambulate pt reports she feels less safe and more unstable and prefers the RW. Therapist also recommending a more stable AD, such as RW, to increase pt's safety at this time - pt in agreement with this plan.  Pt reports onset of headache with visual disturbance dissipating and requests to return to room and lie down. Transported back to room. R stand pivot w/c>EOB, no AD, with close supervision for safety. Sit>supine mod-I. Pt left supine in bed with needs in reach and bed alarm on.      Session 2: Pt received supported in bed eating chips and stating she feels a lot better than earlier - reports the visual disturbance is gone and that she took 2 tylenol for her headache and that it is dissipating - reports she thinks what she was experiencing earlier was from not getting much sleep and from reading a lot. Pt agreeable to therapy session.   Reports need to use bathroom. Supine>sitting R EOB, HOB elevated, mod-I with slight increase in time. Sit<>stands using RW with supervision. Gait in/out bathroom using RW with close supervision for safety - pt managing opening bathroom door and turning on lights - pt with 1x minor L posterior LOB when navigating over bathroom threshold but  able to recover with stepping strategy and using doorframe without physical assist. Performed LB clothing management with distant supervision for safety - continent of bladder, peri-care without assist. Standing hand hygiene at sink with distant supervision.  Gait training ~161ft all the way to the main therapy gym using RW with close supervision and therapist bringing w/c in event of fatigue but not needed - pt has decreased gait  speed, reciprocal although shorter step lengths, kyphotic posturing with downward gaze - pt has 1x moment of minor L LOB when getting close to gym door but able to recover balance and pause to "re-set" prior to continuing, no physical assistance needed.   Stair navigation training ascending/descending 4 steps x1 (6" height) using B HRs with close supervision - step-to pattern both directions leading with R LE on ascent and varying LE on descent with adequate control. Discussed that pt only has 2 STE without HRs but that she normally uses the doorframe for support and feel based on her strength and balance that would be safe with family supervision.   Dynamic balance challenge of moving clothespins from low to high surfaces without UE support focusing on turning R/L as this is when pt tends to have LOB - pt with very minor LOB ~40% of the time when turning but is able to recover without increased assist (appears to have LOB towards L more than R) - CGA for steadying throughout - pt noted to move very slow and guarded and upon questioning pt reports she is "very apprehensive" and also states "it is going to be a long time before I walk by myself" (meaning without an AD).  Dynamic stepping balance challenge of lateral stepping over hockey stick with L HHA for pt confidence and light min assist for balance - pt with several minor LOB but able to recover without increased assistance - with increased repetition starts to take larger steps. Transitioned to forward/backwards stepping over hockey stick with L HHA and consistent min assist for balance with pt having instability primarily when stepping forward. Pt tends to have a very minor LOB and then over-corrects balance recovery causing increased perturbation.   Pt's niece, [name], arrived during session and therapist educated her on pt's CLOF using RW at all times with supervision for functional mobility as well as educated her on pt's CLOF with stair navigation.  Educated on recommendation for follow-up OPPT and 24hr support for safety and family confirms this will be provided - discussed pt appearing to have impaired memory today and that family will need to monitor if pt needs more cognitive support - family reports pt started having challenges with her memory prior to this hospitalization. Family with no additional questions at this time. Transported back to room and pt left seated in w/c with needs in reach, seat belt alarm on, and family present.  Therapy Documentation Precautions:  Precautions Precautions: Fall, Other (comment) Precaution Comments: dizziness, HOH (hears better in L ear) Restrictions Weight Bearing Restrictions: No   Pain:  Session 1: Starts to have headache pain towards end of session - returned to room and assisted to bed.  Session 2: States "I feel it a little in my hip." (Left) - provided seated rest breaks as needed for pain management.    Therapy/Group: Individual Therapy  Tawana Scale , PT, DPT, NCS, CSRS 12/28/2021, 12:21 PM

## 2021-12-28 NOTE — Evaluation (Signed)
Recreational Therapy Assessment and Plan  Patient Details  Name: TAURI ETHINGTON MRN: [id number] Date of Birth: 10-Nov-1934 Today's Date: 12/28/2021  Rehab Potential:  Good ELOS:   d/c 12/11  Assessment  Hospital Problem: Principal Problem:   Cerebellar stroke [facility])     Past Medical History:      Past Medical History:  Diagnosis Date   Arthritis     Atrial fibrillation (HCC)     Carotid artery occlusion     Colitis     COPD (chronic obstructive pulmonary disease) (HCC)     Diverticulitis     Hypertension     Keratosis, seborrheic     Lumbar spondylosis      w/impingement L3-S1    Past Surgical History:       Past Surgical History:  Procedure Laterality Date   ABDOMINAL HYSTERECTOMY       BREAST EXCISIONAL BIOPSY Left     BREAST EXCISIONAL BIOPSY Left     BREAST EXCISIONAL BIOPSY Right     BREAST SURGERY        cyst removal   CAROTID ENDARTERECTOMY Left 2010   CHOLECYSTECTOMY       EYE SURGERY Bilateral 06/2014    cataracts   JOINT REPLACEMENT        RIGHT KNEE   REPLACEMENT TOTAL KNEE Right        Assessment & Plan Clinical Impression: Patient is a 87 y.o. year old female with history of HTN, PAF (patient had d/c coumadin due to sister's bleed hx), L-CEA, COPD, lumbar spondylosis, SBO who was admitted on 12/14/21 with reports of dizziness and nausea w/emesis and inability to get out of bed. She also reported fall a week PTA and that she struck her head without any significant injuries. She was noted to have A Fib with elevated BP and K-2.8. CTA head/neck was negative for LVO and showed complex plaque L-ICA origin w/60% stenosis and evidence of R-ICA fibromuscular dysplasia. MRI brain showed normal brain for age and no acute abnormality. Dr. Leonel Ramsay felt that patient with small cerebellar stroke not seen on Xray and likely embolic due to PAF. She was started on eliquis as patient agreeable. 2 D echo showed EF 55-60% with mild aortic valve calcification  w/trivial AVR.   LDL-27 and at goal. Anxiety managed with IV ativan, she was treated with IVF and hypokalemia has resolved with supplementation.    KUB showed moderate to marked and moderate left hydronephrosis with markedly distended bladder concerning for obstruction --foley recommended.    Also reported to have horizontal nystagmus with dizziness with movement due to vestibular dysfunction. On 11/26, she developed  A fib w/RVR --rates up to 170 treated with IV metoprolol with rates back in 70's. Therapy has been working with patient who continues to be limited by anxiety, dizziness, nausea, pain in BLE and feet, shakiness/tremors with standing,  attempts at vestibular rehab as well as anxiety management. CIR recommended due to functional decline. She is very hard of hearing.   Patient transferred to CIR on 12/20/2021 .    Pt presents with decreased activity tolerance, decreased functional mobility, decreased balance Limiting pt's independence with leisure/community pursuits.   Met with pt today to discuss TR services including leisure education, activity analysis/modifications and stress management.  Also discussed the importance of social, emotional, spiritual health in addition to physical health and their effects on overall health and wellness.  Pt stated understanding.  Plan  No further TR  Recommendations for other services: None  Discharge Criteria: Patient will be discharged from TR if patient refuses treatment 3 consecutive times without medical reason.  If treatment goals not met, if there is a change in medical status, if patient makes no progress towards goals or if patient is discharged from hospital.  The above assessment, treatment plan, treatment alternatives and goals were discussed and mutually agreed upon: by patient  [place] 12/28/2021, 8:26 AM
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
## 2021-12-28 NOTE — Plan of Care (Signed)
  Problem: Consults Goal: RH STROKE PATIENT EDUCATION Description: See Patient Education module for education specifics  Outcome: Progressing   Problem: RH BOWEL ELIMINATION Goal: RH STG MANAGE BOWEL WITH ASSISTANCE Description: STG Manage Bowel with min Assistance. Outcome: Progressing Goal: RH STG MANAGE BOWEL W/MEDICATION W/ASSISTANCE Description: STG Manage Bowel with Medication with min Assistance. Outcome: Progressing   Problem: RH BLADDER ELIMINATION Goal: RH STG MANAGE BLADDER WITH ASSISTANCE Description: STG Manage Bladder With min Assistance Outcome: Progressing   Problem: RH SKIN INTEGRITY Goal: RH STG SKIN FREE OF INFECTION/BREAKDOWN Description: Skin will be free of infection/breakdown with min assist Outcome: Progressing   Problem: RH SAFETY Goal: RH STG ADHERE TO SAFETY PRECAUTIONS W/ASSISTANCE/DEVICE Description: STG Adhere to Safety Precautions With min Assistance/Device. Outcome: Progressing   Problem: RH PAIN MANAGEMENT Goal: RH STG PAIN MANAGED AT OR BELOW PT'S PAIN GOAL Description: Pain will be managed at 4 out of 10 on pain scale min assist with prn medications Outcome: Progressing   Problem: RH KNOWLEDGE DEFICIT Goal: RH STG INCREASE KNOWLEDGE OF HYPERTENSION Description: Patient/caregiver will be able to managed HTN medications from nursing education and handouts independently  Outcome: Progressing Goal: RH STG INCREASE KNOWLEGDE OF HYPERLIPIDEMIA Description: Patient/caregiver will be able to manage hyperlipidemia mediations and verbalize lifestyle changes from nursing education and nursing handouts independently  Outcome: Progressing Goal: RH STG INCREASE KNOWLEDGE OF STROKE PROPHYLAXIS Description: Patient/caregiver will be able to manage medications related to stroke prophylaxis from nursing education and nursing handouts independently  Outcome: Progressing   Problem: Education: Goal: Knowledge of disease or condition will improve Outcome:  Progressing Goal: Knowledge of secondary prevention will improve (MUST DOCUMENT ALL) Outcome: Progressing Goal: Knowledge of patient specific risk factors will improve (Mark N/A or DELETE if not current risk factor) Outcome: Progressing   Problem: Ischemic Stroke/TIA Tissue Perfusion: Goal: Complications of ischemic stroke/TIA will be minimized Outcome: Progressing   Problem: Coping: Goal: Will verbalize positive feelings about self Outcome: Progressing Goal: Will identify appropriate support needs Outcome: Progressing   Problem: Health Behavior/Discharge Planning: Goal: Ability to manage health-related needs will improve Outcome: Progressing Goal: Goals will be collaboratively established with patient/family Outcome: Progressing   Problem: Self-Care: Goal: Ability to participate in self-care as condition permits will improve Outcome: Progressing Goal: Verbalization of feelings and concerns over difficulty with self-care will improve Outcome: Progressing Goal: Ability to communicate needs accurately will improve Outcome: Progressing   Problem: Nutrition: Goal: Risk of aspiration will decrease Outcome: Progressing Goal: Dietary intake will improve Outcome: Progressing   

## 2021-12-29 MED ORDER — OMEPRAZOLE 40 MG PO CPDR
40.0000 mg | DELAYED_RELEASE_CAPSULE | Freq: Every day | ORAL | 0 refills | Status: AC
  Filled 2021-12-29: qty 30, 30d supply, fill #0

## 2021-12-29 MED ORDER — POLYETHYLENE GLYCOL 3350 17 GM/SCOOP PO POWD
17.0000 g | Freq: Every day | ORAL | 0 refills | Status: DC
  Filled 2021-12-29: qty 238, 14d supply, fill #0

## 2021-12-29 MED ORDER — GABAPENTIN 100 MG PO CAPS
200.0000 mg | ORAL_CAPSULE | Freq: Every day | ORAL | 0 refills | Status: DC
  Filled 2021-12-29: qty 30, 15d supply, fill #0

## 2021-12-29 MED ORDER — AMLODIPINE BESYLATE 5 MG PO TABS
5.0000 mg | ORAL_TABLET | Freq: Every day | ORAL | 0 refills | Status: DC
  Filled 2021-12-29: qty 30, 30d supply, fill #0

## 2021-12-29 MED ORDER — SERTRALINE HCL 100 MG PO TABS
100.0000 mg | ORAL_TABLET | Freq: Every day | ORAL | 0 refills | Status: AC
  Filled 2021-12-29: qty 30, 30d supply, fill #0

## 2021-12-29 MED ORDER — LOSARTAN POTASSIUM 100 MG PO TABS
100.0000 mg | ORAL_TABLET | Freq: Every day | ORAL | 0 refills | Status: AC
  Filled 2021-12-29: qty 30, 30d supply, fill #0

## 2021-12-29 MED ORDER — ATENOLOL 25 MG PO TABS
12.5000 mg | ORAL_TABLET | Freq: Every day | ORAL | 0 refills | Status: AC
  Filled 2021-12-29: qty 30, 60d supply, fill #0

## 2021-12-29 MED ORDER — APIXABAN 5 MG PO TABS
5.0000 mg | ORAL_TABLET | Freq: Two times a day (BID) | ORAL | 0 refills | Status: AC
  Filled 2021-12-29: qty 60, 30d supply, fill #0

## 2021-12-29 MED ORDER — DICLOFENAC SODIUM 1 % EX GEL
2.0000 g | Freq: Four times a day (QID) | CUTANEOUS | 1 refills | Status: AC
  Filled 2021-12-29: qty 200, fill #0

## 2021-12-29 MED ORDER — LORAZEPAM 1 MG PO TABS: 0.5000 mg | ORAL_TABLET | Freq: Every day | ORAL | 0 refills | Status: AC

## 2021-12-29 MED ORDER — ROSUVASTATIN CALCIUM 20 MG PO TABS
20.0000 mg | ORAL_TABLET | Freq: Every day | ORAL | 0 refills | Status: AC
  Filled 2021-12-29: qty 30, 30d supply, fill #0

## 2021-12-29 MED ORDER — LIDOCAINE 5 % EX PTCH
2.0000 | MEDICATED_PATCH | Freq: Every day | CUTANEOUS | 0 refills | Status: DC
  Filled 2021-12-29: qty 60, 30d supply, fill #0

## 2021-12-29 NOTE — Progress Notes (Signed)
Occupational Therapy Session Note  Patient Details  Name: DENISSE WHITENACK MRN: [id number] Date of Birth: 1934/09/25  Today's Date: 12/29/2021 OT Individual Time: 1000-1100 OT Individual Time Calculation (min): 30 min OT Individual Time: 7001-7494 OT Individual Time Calculation (min): 28 min    Short Term Goals: Week 2:  OT Short Term Goal 1 (Week 2): STG=LTG (d/t Pt length of stay)  Skilled Therapeutic Interventions/Progress Updates:     Pt received sleeping in bed easy to wake. Pt in good spirits and motivated to participate in skilled OT session with a focus on functional endurance, safety awareness,  and BADL retraining. Pt reporting 0/10 pain and stating she had a great night of sleep.   Pt transition from siting semi-reclined in bed to EOB with HOB elevated supervision. Pt completed sit>stand using RW, ambulated to bathroom, and transferred to tub bench close supervision with increased time and min verbal cues for safety and body mechanics. Pt completed U/LB bathing sitting un tub bench and standing to wash peri area/bottom while holding grab bar with close supervision. Pt ambulated to EOB using RW and completed U/LB dressing with close supervision, standing to bring underwear and pants to her waist using RW.   Pt completed grooming and oral hygiene tasks standing at sink with close supervision. Pt niece, Claiborne Billings, entered room for family education.   Pt and family educated on home safety, energy conservation strategies, and Pt need for 24/7 supervision upon d/c home. Kelly in agreement for need for Pt to have 24/7 supervision and confirming family is available to provided the needed support. Kelly reporting plans to increase accessibility and safety of Pt home by removing clutter from the ground, adding night lights, moving needed items for cooking/cleaning/BADLs to accessible locations, and removing rugs.   Niece educated on guiding Pt through transfers and cues to provide Pt. Niece assisted  Pt through bed, toilet, and furniture transfer with Pt completing at close supervision level and min verbal cues for safety demonstrating good teach back. Niece reported she had no further concerns at this time. Pt was handed off to PT with all immediate needs met.   PM Session: Pt received resting in bed in good spirits and motivated to participate in skilled therapy session. Pt reporting 0/10 pain. Pt requesting to use restroom at beginning of session. Pt ambulated to bathroom using RW and completed 3/3 toileting tasks with close supervision. Pt ambulated to sink and washed her hands close supervision.   Pt ambulated ~150 ft to therapy gym using RW close supervision to CGA +time with min verbal cues for body alignment.   Pt completed seated BUE strengthening/endurance activity EOM. Pt used 2# weighted bar to hit medium sized ball back and forth to therapist. Pt able to tolerate 2 minutes of activity x2 with rest breaks provided sets.  Pt transported back to room total A in wc and left resting in chair with call bell in reach, bed alarm on, and all needs met.   Therapy Documentation Precautions:  Precautions Precautions: Fall, Other (comment) Precaution Comments: dizziness, HOH (hears better in L ear) Restrictions Weight Bearing Restrictions: No General:   Vital Signs: Therapy Vitals Temp: 97.7 F (36.5 C) Temp Source: Oral Pulse Rate: 65 Resp: 18 BP: (Abnormal) 156/65 Patient Position (if appropriate): Lying Oxygen Therapy SpO2: 95 % O2 Device: Room Air Pain:   ADL: ADL Eating: Supervision/safety Where Assessed-Eating: Bed level Grooming: Supervision/safety Where Assessed-Grooming: Sitting at sink Upper Body Bathing: Supervision/safety Where Assessed-Upper Body Bathing: Shower Lower  Body Bathing: Contact guard Where Assessed-Lower Body Bathing: Shower Upper Body Dressing: Supervision/safety Where Assessed-Upper Body Dressing: Edge of bed Lower Body Dressing: Contact  guard Where Assessed-Lower Body Dressing: Edge of bed Toileting: Supervision/safety Where Assessed-Toileting: Glass blower/designer: Close supervision Armed forces technical officer Method: Counselling psychologist: Emergency planning/management officer Transfer: Curator Method: Heritage manager: Civil engineer, contracting with back   Therapy/Group: Individual Therapy  Janey Genta 12/29/2021, 8:02 AM

## 2021-12-29 NOTE — Progress Notes (Signed)
Occupational Therapy Discharge Summary  Patient Details  Name: YENIFER SACCENTE MRN: [id number] Date of Birth: Dec 02, 1934  Date of Discharge from OT service:January 01, 2022  {CHL IP REHAB OT TIME CALCULATIONS:[id number]}   Patient has met 9 of 9 long term goals due to improved activity tolerance, improved balance, postural control, functional use of  LEFT upper extremity, improved awareness, and improved coordination.  Patient to discharge at overall Supervision level.  Patient's care partner is independent to provide the necessary physical and cognitive assistance at discharge.    Reasons goals not met: All goals met   Recommendation:  Patient will benefit from ongoing skilled OT services in outpatient setting to continue to advance functional skills in the area of BADL, iADL, and Reduce care partner burden.  Equipment: No equipment provided  Reasons for discharge: treatment goals met and discharge from hospital  Patient/family agrees with progress made and goals achieved: Yes  OT Discharge Precautions/Restrictions  Precautions Precautions: Fall;Other (comment) Precaution Comments: HOH Restrictions Weight Bearing Restrictions: No General   Vital Signs Therapy Vitals Temp: 98.5 F (36.9 C) Pulse Rate: (Abnormal) 57 Resp: 16 BP: (Abnormal) 119/49 Patient Position (if appropriate): Sitting Oxygen Therapy SpO2: 97 % O2 Device: Room Air Pain Pain Assessment Pain Scale: 0-10 Pain Score: 0-No pain ADL ADL Eating: Independent Where Assessed-Eating: Edge of bed Grooming: Supervision/safety Where Assessed-Grooming: Standing at sink Upper Body Bathing: Supervision/safety Where Assessed-Upper Body Bathing: Shower Lower Body Bathing: Supervision/safety Where Assessed-Lower Body Bathing: Shower Upper Body Dressing: Supervision/safety Where Assessed-Upper Body Dressing: Edge of bed Lower Body Dressing: Supervision/safety Where Assessed-Lower Body Dressing: Edge of  bed Toileting: Supervision/safety Where Assessed-Toileting: Glass blower/designer: Close supervision Toilet Transfer Method: Counselling psychologist: Energy manager: Close supervision Social research officer, government Method: Heritage manager: Civil engineer, contracting with back Vision Baseline Vision/History: 1 Wears glasses Vision Assessment?: No apparent visual deficits Eye Alignment: Within Functional Limits Ocular Range of Motion: Within Functional Limits Alignment/Gaze Preference: Chin down Tracking/Visual Pursuits: Able to track stimulus in all quads without difficulty Convergence: Within functional limits Visual Fields: No apparent deficits Perception  Perception: Impaired Spatial Orientation: Pt will occasionally bump into items on R side or drop objects when trying to reach for them Praxis Praxis: Intact Cognition Cognition Overall Cognitive Status: Within Functional Limits for tasks assessed Arousal/Alertness: Awake/alert Orientation Level: Person;Place;Situation Person: Oriented Place: Oriented Situation: Oriented Memory: Appears intact Attention: Focused;Sustained Focused Attention: Appears intact Sustained Attention: Appears intact Awareness: Appears intact Problem Solving: Appears intact Executive Function: Reasoning;Sequencing;Organizing;Decision Making;Initiating;Self Monitoring;Self Correcting Reasoning: Appears intact Sequencing: Appears intact Organizing: Appears intact Decision Making: Appears intact Initiating: Appears intact Self Monitoring: Appears intact Self Correcting: Appears intact Safety/Judgment: Appears intact Sensation Sensation Light Touch: Appears Intact Hot/Cold: Not tested Proprioception: Appears Intact Stereognosis: Appears Intact Coordination Gross Motor Movements are Fluid and Coordinated: No Fine Motor Movements are Fluid and Coordinated: Yes Coordination and Movement Description: Improved GM  movements since initial evaluation Motor  Motor Motor: Abnormal postural alignment and control Motor - Discharge Observations: Tremors in BLEs Mobility  Bed Mobility Bed Mobility: Supine to Sit;Sit to Supine Supine to Sit: Supervision/Verbal cueing Sit to Supine: Supervision/Verbal cueing Transfers Sit to Stand: Supervision/Verbal cueing Stand to Sit: Supervision/Verbal cueing  Trunk/Postural Assessment  Cervical Assessment Cervical Assessment: Exceptions to [facility] (forward head) Thoracic Assessment Thoracic Assessment: Exceptions to [facility] (rounded shoulders) Lumbar Assessment Lumbar Assessment: Exceptions to [facility] (posterior pelvic tilt) Postural Control Postural Control: Deficits on evaluation Trunk Control: excessive thoracic kyphosis and forward flexion  Righting Reactions: moderately delayed Protective Responses: moderately delayed  Balance Balance Balance Assessed: Yes Static Sitting Balance Static Sitting - Balance Support: Feet supported;Bilateral upper extremity supported Static Sitting - Level of Assistance: 6: Modified independent (Device/Increase time) Dynamic Sitting Balance Dynamic Sitting - Balance Support: Feet supported;Bilateral upper extremity supported Dynamic Sitting - Level of Assistance: 6: Modified independent (Device/Increase time) Dynamic Sitting - Balance Activities: Tourist information centre manager Standing - Balance Support: During functional activity;Bilateral upper extremity supported Static Standing - Level of Assistance: 6: Modified independent (Device/Increase time) Dynamic Standing Balance Dynamic Standing - Balance Support: During functional activity;Bilateral upper extremity supported Dynamic Standing - Level of Assistance: 5: Stand by assistance Extremity/Trunk Assessment RUE Assessment RUE Assessment: Within Functional Limits LUE Assessment LUE Assessment: Within Functional Limits   Janey Genta 12/29/2021, 4:14 PM

## 2021-12-29 NOTE — Progress Notes (Signed)
Physical Therapy Session Note  Patient Details  Name: [name] MRN: [id number] Date of Birth: 09/01/1934  Today's Date: 12/29/2021 PT Individual Time: 5366-4403 + 1101-1157 PT Individual Time Calculation (min): 43 min  + 56 min  Short Term Goals: Week 1:  PT Short Term Goal 1 (Week 1): Pt will perform supine<>sit with supervision PT Short Term Goal 2 (Week 1): Pt will perform sit<>stands using LRAD with CGA PT Short Term Goal 3 (Week 1): Pt will perform bed<>chair transfers using LRAD with CGA PT Short Term Goal 4 (Week 1): Pt will ambulate at least 66ft using LRAD with CGA PT Short Term Goal 5 (Week 1): Pt will navigate 2 stairs in preparation for home entry with CGA Week 2:     Skilled Therapeutic Interventions/Progress Updates:      1st session: Pt sleeping in bed with HOB raised - awakens to voice and is agreeable to PT treatment. She denies pain - reports good nights rest and feels better than she did yesterday. Assisted with donning B hearing aids.   Supine<>Sitting EOB mod I with HOB raised. Sit<>Stand to RW mod I from lowered EOB height. She ambulates with supervision and RW to the bathroom, as she requests to use restroom prior to leaving her room. Pt continent of bladder void - documented in flowsheets. She's able to manage pants/underwear in standing without assist - demo's poor controlled lowering to toilet seat. Able to complete 3/3 toileting tasks without assist.   Ambulates with supervision and RW short distances in her room to the sink. Completes hand hygiene and other self care tasks (oral care, brushing dentures, combing hair) in standing with supervision for standing balance. She stands several minutes without LOB or knee buckling, BUE support on counter top.  Transported to main rehab gym for energy conservation.  Gait training using RW ambulating 154ft with supervisoin and RW - pt reporting discomfort in feet from hospital socks - requesting her slip-on shoes in  her room. Retrieved and patient able to remove socks and don shoes without assist. Ambulated 112ft with supervision and RW while wearing shoes - improved comfort and gait speed compared to without.  Stair climbing using 6inch steps and 2 hand rails - navigated x12 steps with supervision using step-to technique while forward facing. Pt reports she has 2 hand rails she can use at the front entrance of her home - will confirm with family education/training later this morning.  Returned to her room and she concluded session seated EOB. Alarm on, call bell in reach. Provided her with ice water. All needs met at end of session.    2nd session: Direct handoff of care from OT to start. Pt sitting in w/c with her granddaughter, Caryl Asp, present for scheduled family education/training.  Reviewed PT POC, PT goals, DME rec's, f/u therapy recommendations. Also discussed home safety, fall prevention strategies (removal of throw rugs, having night-lights, toileting before bed, etc).   Pt completed car transfer with car height simulating a Manufacturing engineer. Pt completed with supervision assist and RW, ambulatory transfer - able to pivot LE in/out of car without difficulty. Practiced ambulating on unlevel surfaces, using the 2ft ramp - using RW completed at supervision level with cues for control and pacing herself while descending the steps, avoiding picking up too much speed.   Discussed home entrance - family confirms 3 STE with 2 hand rails at the front entrance and 2 STE through garage with no rails. Granddaughter reports the plan is to enter  through the front door but the 2 hand rails are too far apart to grab at the same time. Pt navigated up/down x4 steps with supervision using 2 hand rails to start for practice - step-to pattern while forward facing. She then practiced 4 + 4 +4 with 1 hand rail on R for ascent and then on the right for descent - CGA for safety while using 1 rail but no LOB or knee buckling  observed. Kellie had hands on practice for the last set and demonstrated safe guarding and understanding of precautions.  Reviewed fall precautions and events to do in case of fall. Completed floor transfer from mat table to soft padded surface on ground. She required modA for safely going from mat to floor. From long sitting on floor - she required modA for transitioning to tall kneeling, minA for transitioning to half kneeling, and modA for transitioning from half kneeling to sitting on mat table.   Completed 6 minute walk test using RW and supervision for safety. She covered 422ft within the 6 minutes with x2 brief standing rest breaks at the 2nd minute and 4th minute. Age/sex norm = 1222ft. No LOB noted during testing but she did become fatigued towards the end.   Returned to her room and patient concluded session seated EOB with her granddaughter in room. All needs met, all questions/concerns addressed - both very appreciative of care and therapy services.      Therapy Documentation Precautions:  Precautions Precautions: Fall, Other (comment) Precaution Comments: dizziness, HOH (hears better in L ear) Restrictions Weight Bearing Restrictions: No General:     Therapy/Group: Individual Therapy  Sayra Frisby P Leslee Haueter PT 12/29/2021, 7:52 AM

## 2021-12-29 NOTE — Progress Notes (Signed)
PROGRESS NOTE   Subjective/Complaints:  Received home dose ativan last noc at hs , pt had good night's sleep, confirmed by RN  ROS: Patient deniesCP SOB, N/V/D   Objective:   No results found. No results for input(s): "WBC", "HGB", "HCT", "PLT" in the last 72 hours.  No results for input(s): "NA", "K", "CL", "CO2", "GLUCOSE", "BUN", "CREATININE", "CALCIUM" in the last 72 hours.   Intake/Output Summary (Last 24 hours) at 12/29/2021 0602 Last data filed at 12/28/2021 1700 Gross per 24 hour  Intake 640 ml  Output --  Net 640 ml         Physical Exam: Vital Signs Blood pressure (!) 156/65, pulse 65, temperature 97.7 F (36.5 C), temperature source Oral, resp. rate 18, weight 65 kg, SpO2 95 %.   General: No acute distress Mood and affect are appropriate Heart: Regular rate and rhythm no rubs murmurs or extra sounds Lungs: Clear to auscultation, breathing unlabored, no rales or wheezes Abdomen: Positive bowel sounds, soft nontender to palpation, nondistended Extremities: No clubbing, cyanosis, or edema Skin: No evidence of breakdown, no evidence of rash   Skin: No evidence of breakdown, no evidence of rash Neurologic: Cranial nerves II through XII intact, motor strength is 4/5 in bilateral deltoid, bicep, tricep, grip, hip flexor, knee extensors, ankle dorsiflexor and plantar flexor  Cerebellar exam normal finger to nose to finger as well in bilateral upper  Musculoskeletal: Full range of motion in all 4 extremities. No joint swelling, pain to palp RIght patellar , no foot tenderness  or swelling ,feet less tender with palpation/ROM today    Assessment/Plan: 1. Functional deficits which require 3+ hours per day of interdisciplinary therapy in a comprehensive inpatient rehab setting. Physiatrist is providing close team supervision and 24 hour management of active medical problems listed below. Physiatrist and rehab  team continue to assess barriers to discharge/monitor patient progress toward functional and medical goals  Care Tool:  Bathing    Body parts bathed by patient: Right arm, Left arm, Chest, Abdomen, Front perineal area, Face, Left upper leg, Right upper leg, Right lower leg, Left lower leg   Body parts bathed by helper: Buttocks, Left lower leg, Right lower leg     Bathing assist Assist Level: Contact Guard/Touching assist     Upper Body Dressing/Undressing Upper body dressing   What is the patient wearing?: Pull over shirt    Upper body assist Assist Level: Set up assist    Lower Body Dressing/Undressing Lower body dressing      What is the patient wearing?: Incontinence brief, Pants     Lower body assist Assist for lower body dressing: Contact Guard/Touching assist     Toileting Toileting    Toileting assist Assist for toileting: Minimal Assistance - Patient > 75%     Transfers Chair/bed transfer  Transfers assist     Chair/bed transfer assist level: Supervision/Verbal cueing Chair/bed transfer assistive device: Programmer, multimedia   Ambulation assist   Ambulation activity did not occur: Safety/medical concerns  Assist level: Supervision/Verbal cueing Assistive device: Walker-rolling Max distance: 154ft   Walk 10 feet activity   Assist  Walk 10 feet activity did not  occur: Safety/medical concerns  Assist level: Contact Guard/Touching assist Assistive device: Walker-rolling   Walk 50 feet activity   Assist Walk 50 feet with 2 turns activity did not occur: Safety/medical concerns  Assist level: Minimal Assistance - Patient > 75% Assistive device: Walker-rolling    Walk 150 feet activity   Assist Walk 150 feet activity did not occur: Safety/medical concerns  Assist level: Contact Guard/Touching assist Assistive device: Walker-rolling    Walk 10 feet on uneven surface  activity   Assist Walk 10 feet on uneven surfaces  activity did not occur: Safety/medical concerns         Wheelchair     Assist Is the patient using a wheelchair?: Yes Type of Wheelchair: Manual    Wheelchair assist level: Supervision/Verbal cueing Max wheelchair distance: 89 ft    Wheelchair 50 feet with 2 turns activity    Assist        Assist Level: Supervision/Verbal cueing   Wheelchair 150 feet activity     Assist      Assist Level: Dependent - Patient 0%   Blood pressure (!) 156/65, pulse 65, temperature 97.7 F (36.5 C), temperature source Oral, resp. rate 18, weight 65 kg, SpO2 95 %.  Medical Problem List and Plan: 1. Functional deficits secondary to small right cerebellar stroke.              -patient may shower             -ELOS/Goals: 12-16 days S            Continue CIR therapies including PT, OT  -DVT/anticoagulation:  Pharmaceutical: Eliquis             -antiplatelet therapy: N/a 3. BLE neuropathy: will add low dose gabapentin Also with chronic low back pain , had injections in past in [place] which were "quite helpful" Xray lumbar sow severe DDD, spondylosis , LE pain may be related to Lumbar stenosis,   Also has bilateral achilles pain , R>L will order Bilateral ankle braces  Pt states braces not helpful may benefit from cam walker boot in this regard but too bulky given age and balance issues May have sciatic component to pain as well  4. Insomnia: continue melatonin and gabapentin 5. Neuropsych/cognition: This patient is capable of making decisions on her own behalf. 6. Skin/Wound Care: Routine pressure relief measures.  7. Fluids/Electrolytes/Nutrition: Monitor [email or web address] elevation of LFTs monitor CMET weekly, only received 650mg  of acetaminophen in last 3 d, may be related to crestor but has been on this for at least 63yr 8. A fib: Monitor HR TID--continue tenormin and Eliquis.  9. HTN: Monitor BP TID- continue Amlodipine, Cozaar and tenormin. Decrease amlodipine to 5mg  given  hypotension Vitals:   12/28/21 2012 12/29/21 0420  BP: (!) 129/53 (!) 156/65  Pulse: (!) 58 65  Resp: 17 18  Temp: 98 F (36.7 C) 97.7 F (36.5 C)  SpO2: 96% 95%   10 AKI- encouraged drinking 6-8 glasses of water per day  11. Bilateral hydronephrosis on KUB: Will repeat KUB. Monitor voiding with PVR/bladder scans.              --d/c ditropan.              --Neurogenic bladder? As has hx of lumbar spondylosis with impingement and recent fall  F/u KUB shows bladder decompressed , kidney fxn normal , prior KUB has evidence of residual contrast showing dilatation of renal calyces, Normal kidney 14/07/23 12. Constipation:+BM 12/2 13.  Recurrent hypokalemia: Supplemented X 1 today.     Latest Ref Rng & Units 12/25/2021    7:08 AM 12/21/2021    7:11 AM 12/20/2021    4:06 PM  BMP  Glucose 70 - 99 mg/dL 111  110  120   BUN 8 - 23 mg/dL 19  22  21    Creatinine 0.44 - 1.00 mg/dL 1.13  1.04  1.04   Sodium 135 - 145 mmol/L 132  135  133   Potassium 3.5 - 5.1 mmol/L 5.0  4.7  4.7   Chloride 98 - 111 mmol/L 98  98  96   CO2 22 - 32 mmol/L 23  25  23    Calcium 8.9 - 10.3 mg/dL 8.9  9.4  9.0   K+ wnl 12/4  14. H/o Depression/anxiety: continue Zoloft and ativan prn 15. Orthostasis: encouraged patient to drink water in morning before trying to sit up 16. Hyponatremia: will liberalize to regular diet from heart healthy.   17.  Dizziness does not appear to be BP related, vestibular eval in progress, if inner ear causes r/o ed then it is likely due to Cerebellar infarct    18.  Insomnia- denies taking sleep med at home but per Home meds was taking ativan BID,monitor effect, slept well last noc , appears clear this am   LOS: 9 days A FACE TO FACE EVALUATION WAS PERFORMED  Charlett Blake 12/29/2021, 6:02 AM

## 2021-12-29 NOTE — Progress Notes (Signed)
Patient ID: [name], female   DOB: 08-Nov-1934, 87 y.o.   MRN: [id number]  [facility]. P: [phone] F: [phone]

## 2021-12-30 NOTE — Plan of Care (Signed)
  Problem: Consults Goal: RH STROKE PATIENT EDUCATION Description: See Patient Education module for education specifics  Outcome: Progressing   Problem: RH BOWEL ELIMINATION Goal: RH STG MANAGE BOWEL WITH ASSISTANCE Description: STG Manage Bowel with min Assistance. Outcome: Progressing Goal: RH STG MANAGE BOWEL W/MEDICATION W/ASSISTANCE Description: STG Manage Bowel with Medication with min Assistance. Outcome: Progressing   Problem: RH BLADDER ELIMINATION Goal: RH STG MANAGE BLADDER WITH ASSISTANCE Description: STG Manage Bladder With min Assistance Outcome: Progressing   Problem: RH SKIN INTEGRITY Goal: RH STG SKIN FREE OF INFECTION/BREAKDOWN Description: Skin will be free of infection/breakdown with min assist Outcome: Progressing   Problem: RH SAFETY Goal: RH STG ADHERE TO SAFETY PRECAUTIONS W/ASSISTANCE/DEVICE Description: STG Adhere to Safety Precautions With min Assistance/Device. Outcome: Progressing   Problem: RH PAIN MANAGEMENT Goal: RH STG PAIN MANAGED AT OR BELOW PT'S PAIN GOAL Description: Pain will be managed at 4 out of 10 on pain scale min assist with prn medications Outcome: Progressing   Problem: RH KNOWLEDGE DEFICIT Goal: RH STG INCREASE KNOWLEDGE OF HYPERTENSION Description: Patient/caregiver will be able to managed HTN medications from nursing education and handouts independently  Outcome: Progressing Goal: RH STG INCREASE KNOWLEGDE OF HYPERLIPIDEMIA Description: Patient/caregiver will be able to manage hyperlipidemia mediations and verbalize lifestyle changes from nursing education and nursing handouts independently  Outcome: Progressing Goal: RH STG INCREASE KNOWLEDGE OF STROKE PROPHYLAXIS Description: Patient/caregiver will be able to manage medications related to stroke prophylaxis from nursing education and nursing handouts independently  Outcome: Progressing   Problem: Education: Goal: Knowledge of disease or condition will improve Outcome:  Progressing Goal: Knowledge of secondary prevention will improve (MUST DOCUMENT ALL) Outcome: Progressing Goal: Knowledge of patient specific risk factors will improve (Mark N/A or DELETE if not current risk factor) Outcome: Progressing   Problem: Ischemic Stroke/TIA Tissue Perfusion: Goal: Complications of ischemic stroke/TIA will be minimized Outcome: Progressing   Problem: Coping: Goal: Will verbalize positive feelings about self Outcome: Progressing Goal: Will identify appropriate support needs Outcome: Progressing   Problem: Health Behavior/Discharge Planning: Goal: Ability to manage health-related needs will improve Outcome: Progressing Goal: Goals will be collaboratively established with patient/family Outcome: Progressing   Problem: Self-Care: Goal: Ability to participate in self-care as condition permits will improve Outcome: Progressing Goal: Verbalization of feelings and concerns over difficulty with self-care will improve Outcome: Progressing Goal: Ability to communicate needs accurately will improve Outcome: Progressing   Problem: Nutrition: Goal: Risk of aspiration will decrease Outcome: Progressing Goal: Dietary intake will improve Outcome: Progressing   

## 2021-12-30 NOTE — Progress Notes (Signed)
Physical Therapy Discharge Summary  Patient Details  Name: [name] MRN: [id number] Date of Birth: 08/01/1934  Date of Discharge from PT service:December 30, 2021  {CHL IP REHAB PT TIME CALCULATION:[id number]}   Patient has met {NUMBERS 0-12:18577} of {NUMBERS 0-12:18577} long term goals due to improved activity tolerance, improved balance, improved postural control, increased strength, ability to compensate for deficits, and improved awareness.  Patient to discharge at an ambulatory level Supervision using RW. Patient's care partner attended hands-on education and is independent to provide the necessary physical and cognitive assistance at discharge.  Reasons goals not met: ***  Recommendation:  Patient will benefit from ongoing skilled PT services in outpatient setting to continue to advance safe functional mobility, address ongoing impairments in dynamic standing balance, dynamic gait training using LRAD, stair navigation training, B LE strengthening, and minimize fall risk.  Equipment: RW  Reasons for discharge: treatment goals met and discharge from hospital  Patient/family agrees with progress made and goals achieved: Yes  PT Discharge Precautions/Restrictions Precautions Precautions: Fall;Other (comment) Precaution Comments: HOH Restrictions Weight Bearing Restrictions: No Pain Pain Assessment Pain Scale: 0-10 Pain Score: 0-No pain Pain Interference   Vision/Perception     Cognition Overall Cognitive Status: Within Functional Limits for tasks assessed Arousal/Alertness: Awake/alert Orientation Level: Oriented X4 Attention: Sustained;Focused Focused Attention: Appears intact Sustained Attention: Appears intact Awareness: Appears intact Safety/Judgment: Appears intact Sensation Sensation Light Touch: Appears Intact Hot/Cold: Not tested Proprioception: Appears Intact Stereognosis: Not tested Coordination Gross Motor Movements are Fluid and Coordinated:  Yes Coordination and Movement Description: Improved GM movements since initial evaluation Motor  Motor Motor: Abnormal postural alignment and control;Other (comment) Motor - Discharge Observations: generalized deconditioning and impaired balance  Mobility Bed Mobility Bed Mobility: Supine to Sit;Sit to Supine Supine to Sit: Independent with assistive device Sit to Supine: Independent with assistive device Transfers Transfers: Sit to Stand;Stand to Sit;Stand Pivot Transfers Sit to Stand: Supervision/Verbal cueing Stand to Sit: Supervision/Verbal cueing Stand Pivot Transfers: Supervision/Verbal cueing Stand Pivot Transfer Details: Verbal cues for precautions/safety Transfer (Assistive device): Rolling walker Locomotion  Gait Ambulation: Yes Gait Assistance: Supervision/Verbal cueing Gait Distance (Feet): 150 Feet Assistive device: Rolling walker Gait Assistance Details: Verbal cues for safe use of DME/AE Gait Gait: Yes Gait Pattern: Impaired Gait Pattern: Step-through pattern Gait velocity: significantly decrease Stairs / Additional Locomotion Stairs: Yes Stairs Assistance: Supervision/Verbal cueing Stair Management Technique: Two rails;Step to pattern;Forwards Number of Stairs: 12 Height of Stairs: 6 Ramp: Supervision/Verbal cueing Curb: Contact Guard/Touching assist Wheelchair Mobility Wheelchair Mobility: No  Trunk/Postural Assessment  Cervical Assessment Cervical Assessment: Exceptions to [facility] (very limited L cervical rotation ROM, moderately limited rotation towards R; forward head) Thoracic Assessment Thoracic Assessment: Exceptions to [facility] (rounded shoulders with moderate thoracic kyphosis) Lumbar Assessment Lumbar Assessment: Exceptions to [facility] (posterior pelvic tilt) Postural Control Postural Control: Deficits on evaluation Trunk Control: WFL but excessive thoracic kyphosis and forward flexion posturing Righting Reactions: adequate using B UE support on RW for  basic functional mobility  Balance Balance Balance Assessed: Yes Static Sitting Balance Static Sitting - Balance Support: Feet supported;Bilateral upper extremity supported Static Sitting - Level of Assistance: 6: Modified independent (Device/Increase time) Dynamic Sitting Balance Dynamic Sitting - Balance Support: Feet supported;Bilateral upper extremity supported Dynamic Sitting - Level of Assistance: 6: Modified independent (Device/Increase time) Static Standing Balance Static Standing - Balance Support: During functional activity;Bilateral upper extremity supported Static Standing - Level of Assistance: 6: Modified independent (Device/Increase time) Dynamic Standing Balance Dynamic Standing - Balance Support: During functional  activity;Bilateral upper extremity supported Dynamic Standing - Level of Assistance: 5: Stand by assistance Extremity Assessment ***           Tawana Scale , PT, DPT, NCS, CSRS 12/30/2021, 6:16 PM

## 2021-12-31 NOTE — Progress Notes (Signed)
Physical Therapy Session Note  Patient Details  Name: [name] MRN: [id number] Date of Birth: 1934/09/13  Today's Date: 12/31/2021 PT Individual Time: 0800-0900 PT Individual Time Calculation (min): 60 min   Short Term Goals: Week 1:  PT Short Term Goal 1 (Week 1): Pt will perform supine<>sit with supervision PT Short Term Goal 2 (Week 1): Pt will perform sit<>stands using LRAD with CGA PT Short Term Goal 3 (Week 1): Pt will perform bed<>chair transfers using LRAD with CGA PT Short Term Goal 4 (Week 1): Pt will ambulate at least 64ft using LRAD with CGA PT Short Term Goal 5 (Week 1): Pt will navigate 2 stairs in preparation for home entry with CGA   Skilled Therapeutic Interventions/Progress Updates:    Pt received seated in bed, agreeable to PT session. Pt reports no pain today. Seated in bed to sitting EOB at mod I level. Sit to stand and transfers with RW with RW at mod I level throughout session. Ambulatory transfer into bathroom with Supervision and RW. Pt is setup A for pericare following continent BM, independent with clothing management. Ambulation 3 x 150 ft with RW at Supervision level, flexed trunk posture and B shoulder elevation, cues needed to decrease UE support and tension. Car transfer with use of RW and Supervision. Ambulaiton up/down ramp and across uneven ground with RW and Supervision, cues for safe RW management on uneven ground. Ascend/descend 12 x 6" stairs with 2 handrails at Supervision level, alternating gait pattern. Pt able to safely retrieve clothespins from floor with RW and use of reacher at Supervision level with some cues needed for safety. Pt reports she has several "grabbers" at home, encouraged continued use of this AE upon d/c home. Pt returned to room and left seated in recliner with needs in reach at end of session.  Therapy Documentation Precautions:  Precautions Precautions: Fall, Other (comment) Precaution Comments: HOH Restrictions Weight  Bearing Restrictions: No     Therapy/Group: Individual Therapy   Peter Congo, PT, DPT, CSRS 12/31/2021, 11:00 AM

## 2021-12-31 NOTE — Plan of Care (Signed)

## 2021-12-31 NOTE — Plan of Care (Signed)
  Problem: Consults Goal: RH STROKE PATIENT EDUCATION Description: See Patient Education module for education specifics  Outcome: Progressing   Problem: RH BOWEL ELIMINATION Goal: RH STG MANAGE BOWEL WITH ASSISTANCE Description: STG Manage Bowel with min Assistance. Outcome: Progressing Goal: RH STG MANAGE BOWEL W/MEDICATION W/ASSISTANCE Description: STG Manage Bowel with Medication with min Assistance. Outcome: Progressing   Problem: RH BLADDER ELIMINATION Goal: RH STG MANAGE BLADDER WITH ASSISTANCE Description: STG Manage Bladder With min Assistance Outcome: Progressing   Problem: RH SKIN INTEGRITY Goal: RH STG SKIN FREE OF INFECTION/BREAKDOWN Description: Skin will be free of infection/breakdown with min assist Outcome: Progressing   Problem: RH SAFETY Goal: RH STG ADHERE TO SAFETY PRECAUTIONS W/ASSISTANCE/DEVICE Description: STG Adhere to Safety Precautions With min Assistance/Device. Outcome: Progressing   Problem: RH PAIN MANAGEMENT Goal: RH STG PAIN MANAGED AT OR BELOW PT'S PAIN GOAL Description: Pain will be managed at 4 out of 10 on pain scale min assist with prn medications Outcome: Progressing   Problem: RH KNOWLEDGE DEFICIT Goal: RH STG INCREASE KNOWLEDGE OF HYPERTENSION Description: Patient/caregiver will be able to managed HTN medications from nursing education and handouts independently  Outcome: Progressing Goal: RH STG INCREASE KNOWLEGDE OF HYPERLIPIDEMIA Description: Patient/caregiver will be able to manage hyperlipidemia mediations and verbalize lifestyle changes from nursing education and nursing handouts independently  Outcome: Progressing Goal: RH STG INCREASE KNOWLEDGE OF STROKE PROPHYLAXIS Description: Patient/caregiver will be able to manage medications related to stroke prophylaxis from nursing education and nursing handouts independently  Outcome: Progressing   Problem: Education: Goal: Knowledge of disease or condition will improve Outcome:  Progressing Goal: Knowledge of secondary prevention will improve (MUST DOCUMENT ALL) Outcome: Progressing Goal: Knowledge of patient specific risk factors will improve Loraine Leriche N/A or DELETE if not current risk factor) Outcome: Progressing   Problem: Ischemic Stroke/TIA Tissue Perfusion: Goal: Complications of ischemic stroke/TIA will be minimized Outcome: Progressing   Problem: Coping: Goal: Will verbalize positive feelings about self Outcome: Progressing Goal: Will identify appropriate support needs Outcome: Progressing   Problem: Health Behavior/Discharge Planning: Goal: Ability to manage health-related needs will improve Outcome: Progressing Goal: Goals will be collaboratively established with patient/family Outcome: Progressing   Problem: Self-Care: Goal: Ability to participate in self-care as condition permits will improve Outcome: Progressing Goal: Verbalization of feelings and concerns over difficulty with self-care will improve Outcome: Progressing Goal: Ability to communicate needs accurately will improve Outcome: Progressing   Problem: Nutrition: Goal: Risk of aspiration will decrease Outcome: Progressing Goal: Dietary intake will improve Outcome: Progressing

## 2021-12-31 NOTE — Plan of Care (Signed)
  Problem: RH Balance Goal: LTG Patient will maintain dynamic standing with ADLs (OT) Description: LTG:  Patient will maintain dynamic standing balance with assist during activities of daily living (OT)  Outcome: Completed/Met   Problem: Sit to Stand Goal: LTG:  Patient will perform sit to stand in prep for activites of daily living with assistance level (OT) Description: LTG:  Patient will perform sit to stand in prep for activites of daily living with assistance level (OT) Outcome: Completed/Met   Problem: RH Bathing Goal: LTG Patient will bathe all body parts with assist levels (OT) Description: LTG: Patient will bathe all body parts with assist levels (OT) Outcome: Completed/Met   Problem: RH Dressing Goal: LTG Patient will perform upper body dressing (OT) Description: LTG Patient will perform upper body dressing with assist, with/without cues (OT). Outcome: Completed/Met Goal: LTG Patient will perform lower body dressing w/assist (OT) Description: LTG: Patient will perform lower body dressing with assist, with/without cues in positioning using equipment (OT) Outcome: Completed/Met   Problem: RH Toileting Goal: LTG Patient will perform toileting task (3/3 steps) with assistance level (OT) Description: LTG: Patient will perform toileting task (3/3 steps) with assistance level (OT)  Outcome: Completed/Met   Problem: RH Simple Meal Prep Goal: LTG Patient will perform simple meal prep w/assist (OT) Description: LTG: Patient will perform simple meal prep with assistance, with/without cues (OT). Outcome: Completed/Met   Problem: RH Toilet Transfers Goal: LTG Patient will perform toilet transfers w/assist (OT) Description: LTG: Patient will perform toilet transfers with assist, with/without cues using equipment (OT) Outcome: Completed/Met   Problem: RH Tub/Shower Transfers Goal: LTG Patient will perform tub/shower transfers w/assist (OT) Description: LTG: Patient will perform  tub/shower transfers with assist, with/without cues using equipment (OT) Outcome: Completed/Met

## 2021-12-31 NOTE — Progress Notes (Addendum)
PROGRESS NOTE   Subjective/Complaints:  No issues overnite slept well took ativan BID at home but is now only taking qhs dose   ROS: Patient deniesCP SOB, N/V/D   Objective:   No results found. No results for input(s): "WBC", "HGB", "HCT", "PLT" in the last 72 hours.  No results for input(s): "NA", "K", "CL", "CO2", "GLUCOSE", "BUN", "CREATININE", "CALCIUM" in the last 72 hours.   Intake/Output Summary (Last 24 hours) at 12/31/2021 0732 Last data filed at 12/30/2021 1809 Gross per 24 hour  Intake 597 ml  Output --  Net 597 ml         Physical Exam: Vital Signs Blood pressure (!) 163/64, pulse 66, temperature 98 F (36.7 C), resp. rate 16, weight 65 kg, SpO2 91 %.   General: No acute distress Mood and affect are appropriate Heart: Regular rate and rhythm no rubs murmurs or extra sounds Lungs: Clear to auscultation, breathing unlabored, no rales or wheezes Abdomen: Positive bowel sounds, soft nontender to palpation, nondistended Extremities: No clubbing, cyanosis, or edema Skin: No evidence of breakdown, no evidence of rash   Skin: No evidence of breakdown, no evidence of rash Neurologic: Cranial nerves II through XII intact, motor strength is 4/5 in bilateral deltoid, bicep, tricep, grip, hip flexor, knee extensors, ankle dorsiflexor and plantar flexor  Cerebellar exam normal finger to nose to finger as well in bilateral upper  Musculoskeletal: Full range of motion in all 4 extremities. No joint swelling, pain to palp RIght patellar , no foot tenderness  or swelling ,feet less tender with palpation/ROM today    Assessment/Plan: 1. Functional deficits which require 3+ hours per day of interdisciplinary therapy in a comprehensive inpatient rehab setting. Physiatrist is providing close team supervision and 24 hour management of active medical problems listed below. Physiatrist and rehab team continue to assess  barriers to discharge/monitor patient progress toward functional and medical goals  Care Tool:  Bathing    Body parts bathed by patient: Right arm, Left arm, Chest, Abdomen, Front perineal area, Face, Left upper leg, Right upper leg, Right lower leg, Left lower leg, Buttocks   Body parts bathed by helper: Buttocks, Left lower leg, Right lower leg     Bathing assist Assist Level: Supervision/Verbal cueing     Upper Body Dressing/Undressing Upper body dressing   What is the patient wearing?: Pull over shirt    Upper body assist Assist Level: Supervision/Verbal cueing    Lower Body Dressing/Undressing Lower body dressing      What is the patient wearing?: Underwear/pull up, Pants     Lower body assist Assist for lower body dressing: Supervision/Verbal cueing     Toileting Toileting    Toileting assist Assist for toileting: Supervision/Verbal cueing     Transfers Chair/bed transfer  Transfers assist     Chair/bed transfer assist level: Supervision/Verbal cueing Chair/bed transfer assistive device: Walker, Archivist   Ambulation assist   Ambulation activity did not occur: Safety/medical concerns  Assist level: Supervision/Verbal cueing Assistive device: Walker-rolling Max distance: 450ft   Walk 10 feet activity   Assist  Walk 10 feet activity did not occur: Safety/medical concerns  Assist level: Supervision/Verbal  cueing Assistive device: Walker-rolling   Walk 50 feet activity   Assist Walk 50 feet with 2 turns activity did not occur: Safety/medical concerns  Assist level: Supervision/Verbal cueing Assistive device: Walker-rolling    Walk 150 feet activity   Assist Walk 150 feet activity did not occur: Safety/medical concerns  Assist level: Supervision/Verbal cueing Assistive device: Walker-rolling    Walk 10 feet on uneven surface  activity   Assist Walk 10 feet on uneven surfaces activity did not occur:  Safety/medical concerns   Assist level: Supervision/Verbal cueing Assistive device: Walker-rolling   Wheelchair     Assist Is the patient using a wheelchair?: Yes Type of Wheelchair: Manual    Wheelchair assist level: Supervision/Verbal cueing Max wheelchair distance: 89 ft    Wheelchair 50 feet with 2 turns activity    Assist        Assist Level: Supervision/Verbal cueing   Wheelchair 150 feet activity     Assist      Assist Level: Dependent - Patient 0%   Blood pressure (!) 163/64, pulse 66, temperature 98 F (36.7 C), resp. rate 16, weight 65 kg, SpO2 91 %.  Medical Problem List and Plan: 1. Functional deficits secondary to small right cerebellar stroke.              -patient may shower             -ELOS/Goals: D/C home in am 12/11            Continue CIR therapies including PT, OT  -DVT/anticoagulation:  Pharmaceutical: Eliquis             -antiplatelet therapy: N/a 3. BLE neuropathy: will add low dose gabapentin Also with chronic low back pain , had injections in past in [place] which were "quite helpful" Xray lumbar sow severe DDD, spondylosis , LE pain may be related to Lumbar stenosis,   Also has bilateral achilles pain , R>L will order Bilateral ankle braces  Pt states braces not helpful may benefit from cam walker boot in this regard but too bulky given age and balance issues Pain improved 4. Insomnia: continue melatonin and gabapentin 5. Neuropsych/cognition: This patient is capable of making decisions on her own behalf. 6. Skin/Wound Care: Routine pressure relief measures.  7. Fluids/Electrolytes/Nutrition: Monitor [email or web address] elevation of LFTs monitor CMET weekly, only received 650mg  of acetaminophen in last 3 d, may be related to crestor but has been on this for at least 84yr 8. A fib: Monitor HR TID--continue tenormin and Eliquis.  9. HTN: Monitor BP TID- continue Amlodipine, Cozaar and tenormin. Decrease amlodipine to 5mg  given  hypotension Vitals:   12/30/21 1919 12/31/21 0523  BP: (!) 131/50 (!) 163/64  Pulse: 61 66  Resp: 16 16  Temp: 98.5 F (36.9 C) 98 F (36.7 C)  SpO2: 95% 91%   10 AKI- encouraged drinking 6-8 glasses of water per day  11. Bilateral hydronephrosis on KUB: Will repeat KUB. Monitor voiding with PVR/bladder scans.              --d/c ditropan.              --Neurogenic bladder? As has hx of lumbar spondylosis with impingement and recent fall  F/u KUB shows bladder decompressed , kidney fxn normal , prior KUB has evidence of residual contrast showing dilatation of renal calyces, Normal kidney 14/09/23 12. Constipation:+BM 12/2 13. Recurrent hypokalemia: Supplemented X 1 today.     Latest Ref Rng & Units 12/25/2021  7:08 AM 12/21/2021    7:11 AM 12/20/2021    4:06 PM  BMP  Glucose 70 - 99 mg/dL 157  262  035   BUN 8 - 23 mg/dL 19  22  21    Creatinine 0.44 - 1.00 mg/dL  5.97  4.16   Sodium 135 - 145 mmol/L 132  135  133   Potassium 3.5 - 5.1 mmol/L 5.0  4.7  4.7   Chloride 98 - 111 mmol/L 98  98  96   CO2 22 - 32 mmol/L 23  25  23    Calcium 8.9 - 10.3 mg/dL 8.9  9.4  9.0   K+ wnl 12/4  14. H/o Depression/anxiety: continue Zoloft and ativan prn 15. Orthostasis: encouraged patient to drink water in morning before trying to sit up 16. Hyponatremia: will liberalize to regular diet from heart healthy.   17.  Dizziness does not appear to be BP related, vestibular eval in progress, if inner ear causes r/o ed then it is likely due to Cerebellar infarct    18.  Insomnia- denies taking sleep med at home but per Home meds was taking ativan BID,monitor effect, slept well last noc , appears clear this am   LOS: 11 days A FACE TO FACE EVALUATION WAS PERFORMED  12/31/2021, 7:32 AM

## 2022-01-01 ENCOUNTER — Other Ambulatory Visit (HOSPITAL_COMMUNITY): Payer: Self-pay

## 2022-01-01 DIAGNOSIS — E871 Hypo-osmolality and hyponatremia: Secondary | ICD-10-CM

## 2022-01-01 DIAGNOSIS — E876 Hypokalemia: Secondary | ICD-10-CM

## 2022-01-01 DIAGNOSIS — K5900 Constipation, unspecified: Secondary | ICD-10-CM

## 2022-01-01 DIAGNOSIS — N179 Acute kidney failure, unspecified: Secondary | ICD-10-CM

## 2022-01-01 LAB — COMPREHENSIVE METABOLIC PANEL
ALT: 23 U/L (ref 0–44)
AST: 18 U/L (ref 15–41)
Albumin: 3.1 g/dL — ABNORMAL LOW (ref 3.5–5.0)
Alkaline Phosphatase: 82 U/L (ref 38–126)
Anion gap: 7 (ref 5–15)
BUN: 24 mg/dL — ABNORMAL HIGH (ref 8–23)
CO2: 26 mmol/L (ref 22–32)
Calcium: 8.9 mg/dL (ref 8.9–10.3)
Chloride: 102 mmol/L (ref 98–111)
Creatinine, Ser: 1.14 mg/dL — ABNORMAL HIGH (ref 0.44–1.00)
GFR, Estimated: 47 mL/min — ABNORMAL LOW (ref >60)
Glucose, Bld: 103 mg/dL — ABNORMAL HIGH (ref 70–99)
Potassium: 4.5 mmol/L (ref 3.5–5.1)
Sodium: 135 mmol/L (ref 135–145)
Total Bilirubin: 0.4 mg/dL (ref 0.3–1.2)
Total Protein: 6.9 g/dL (ref 6.5–8.1)

## 2022-01-01 NOTE — Progress Notes (Signed)
Inpatient Rehabilitation Care Coordinator Discharge Note   Patient Details  Name: [name] MRN: [id number] Date of Birth: [birth year]   Discharge location: HOME WITH FAMILY CAN PROVIDE SUPERVISION LEVEL  Length of Stay: 12 DAYS  Discharge activity level: CGA-SUPERVISION  Home/community participation: ACTIVE  Patient response ZG:YFVCBS Literacy - How often do you need to have someone help you when you read instructions, pamphlets, or other written material from your doctor or pharmacy?: Never  Patient response WH:QPRFFM Isolation - How often do you feel lonely or isolated from those around you?: Never  Services provided included: MD, RD, PT, OT, RN, CM, Pharmacy, SW  Financial Services:  Financial Services Utilized: Chart[employer] loss adjuster MEDICARE  Choices offered to/list presented to: PT AND NIECE  Follow-up services arranged:  Outpatient, DME, Patient/Family has no preference for HH/DME agencies    Outpatient Servicies: [facility] [address] PT & OT DME : [facility]    Patient response to transportation need: Is the patient able to respond to transportation needs?: Yes In the past 12 months, has lack of transportation kept you from medical appointments or from getting medications?: No In the past 12 months, has lack of transportation kept you from meetings, work, or from getting things needed for daily living?: No    Comments (or additional information):FAMILY WAS IN FOR EDUCATION AND FEEL PREPARED FOR DC TODAY  Patient/Family verbalized understanding of follow-up arrangements:  Yes  Individual responsible for coordination of the follow-up plan: CINDY-NIECE [phone]  Confirmed correct DME delivered: Lucy Chris 01/01/2022    Yoseph Haile, Lemar Livings

## 2022-01-01 NOTE — Progress Notes (Signed)
Inpatient Rehabilitation Discharge Medication Review by a Pharmacist  A complete drug regimen review was completed for this patient to identify any potential clinically significant medication issues.  High Risk Drug Classes Is patient taking? Indication by Medication  Antipsychotic No   Anticoagulant Yes Apixaban - PAF  Antibiotic No   Opioid No   Antiplatelet No   Hypoglycemics/insulin No   Vasoactive Medication Yes Amlodipine, atenolol, losartan - BP  Chemotherapy No   Other Yes Lorazepam-sleep, anxiety Omeprazole - Reflux Rosuvastatin - HLD Sertraline - mood  Lidocaine, Gabapentin - neuropathic pain     Type of Medication Issue Identified Description of Issue Recommendation(s)  Drug Interaction(s) (clinically significant)     Duplicate Therapy     Allergy     No Medication Administration End Date     Incorrect Dose     Additional Drug Therapy Needed     Significant med changes from prior encounter (inform family/care partners about these prior to discharge).    Other       Clinically significant medication issues were identified that warrant physician communication and completion of prescribed/recommended actions by midnight of the next day:  No  Pharmacist comments: None  Time spent performing this drug regimen review (minutes):  20 minutes  Thank you [name], PharmD

## 2022-01-01 NOTE — Final Progress Note (Signed)
Pt discharged with belonging and meds from TOC. Pt discharged home with family.

## 2022-01-01 NOTE — Progress Notes (Signed)
Assisted up to BR, no complaints,plan discharge home today,continue to monitor and assist, bed alarm on ,call bell within reach

## 2022-01-01 NOTE — Progress Notes (Signed)
PROGRESS NOTE   Subjective/Complaints:  No new concerns or complaints. Sleeping in bed but wakes to voice. Plan for DC home today.  ROS: Patient deniesCP SOB, N/V/D, abdominal pain   Objective:   No results found. No results for input(s): "WBC", "HGB", "HCT", "PLT" in the last 72 hours.  Recent Labs    01/01/22 0629  NA 135  K 4.5  CL 102  CO2 26  GLUCOSE 103*  BUN 24*  CREATININE 1.14*  CALCIUM 8.9     Intake/Output Summary (Last 24 hours) at 01/01/2022 0931 Last data filed at 01/01/2022 0729 Gross per 24 hour  Intake 940 ml  Output --  Net 940 ml         Physical Exam: Vital Signs Blood pressure 122/62, pulse 67, temperature 98 F (36.7 C), resp. rate 17, weight 65 kg, SpO2 100 %.   General: No acute distress Mood and affect are appropriate Heart: Regular rate and rhythm no rubs murmurs or extra sounds Lungs: Clear to auscultation, breathing unlabored, no rales or wheezes Abdomen: Positive bowel sounds, soft nontender to palpation, nondistended Extremities: No clubbing, cyanosis, or edema Skin: No evidence of breakdown, no evidence of rash   Skin: No evidence of breakdown, no evidence of rash Neurologic: Cranial nerves II through XII intact, motor strength is 4/5 in bilateral deltoid, bicep, tricep, grip, hip flexor, knee extensors, ankle dorsiflexor and plantar flexor  Cerebellar exam normal finger to nose to finger as well in bilateral upper  Musculoskeletal: Full range of motion in all 4 extremities. No joint swelling, pain to palp RIght patellar , no foot tenderness  or swelling ,feet less tender with palpation/ROM today    Assessment/Plan: 1. Functional deficits which require 3+ hours per day of interdisciplinary therapy in a comprehensive inpatient rehab setting. Physiatrist is providing close team supervision and 24 hour management of active medical problems listed below. Physiatrist  and rehab team continue to assess barriers to discharge/monitor patient progress toward functional and medical goals  Care Tool:  Bathing    Body parts bathed by patient: Right arm, Left arm, Chest, Abdomen, Front perineal area, Face, Left upper leg, Right upper leg, Right lower leg, Left lower leg, Buttocks   Body parts bathed by helper: Buttocks, Left lower leg, Right lower leg     Bathing assist Assist Level: Supervision/Verbal cueing     Upper Body Dressing/Undressing Upper body dressing   What is the patient wearing?: Pull over shirt    Upper body assist Assist Level: Supervision/Verbal cueing    Lower Body Dressing/Undressing Lower body dressing      What is the patient wearing?: Underwear/pull up, Pants     Lower body assist Assist for lower body dressing: Supervision/Verbal cueing     Toileting Toileting    Toileting assist Assist for toileting: Supervision/Verbal cueing     Transfers Chair/bed transfer  Transfers assist     Chair/bed transfer assist level: Supervision/Verbal cueing Chair/bed transfer assistive device: Walker, Archivist   Ambulation assist   Ambulation activity did not occur: Safety/medical concerns  Assist level: Supervision/Verbal cueing Assistive device: Walker-rolling Max distance: 483ft   Walk 10 feet activity  Assist  Walk 10 feet activity did not occur: Safety/medical concerns  Assist level: Supervision/Verbal cueing Assistive device: Walker-rolling   Walk 50 feet activity   Assist Walk 50 feet with 2 turns activity did not occur: Safety/medical concerns  Assist level: Supervision/Verbal cueing Assistive device: Walker-rolling    Walk 150 feet activity   Assist Walk 150 feet activity did not occur: Safety/medical concerns  Assist level: Supervision/Verbal cueing Assistive device: Walker-rolling    Walk 10 feet on uneven surface  activity   Assist Walk 10 feet on uneven  surfaces activity did not occur: Safety/medical concerns   Assist level: Supervision/Verbal cueing Assistive device: Walker-rolling   Wheelchair     Assist Is the patient using a wheelchair?: Yes Type of Wheelchair: Manual    Wheelchair assist level: Supervision/Verbal cueing Max wheelchair distance: 89 ft    Wheelchair 50 feet with 2 turns activity    Assist        Assist Level: Supervision/Verbal cueing   Wheelchair 150 feet activity     Assist      Assist Level: Dependent - Patient 0%   Blood pressure 122/62, pulse 67, temperature 98 F (36.7 C), resp. rate 17, weight 65 kg, SpO2 100 %.  Medical Problem List and Plan: 1. Functional deficits secondary to small right cerebellar stroke.              -patient may shower             -ELOS/Goals: D/C home in am 12/11            Continue CIR therapies including PT, OT  -DVT/anticoagulation:  Pharmaceutical: Eliquis             -antiplatelet therapy: N/a  -DC home today 3. BLE neuropathy: will add low dose gabapentin Also with chronic low back pain , had injections in past in [place] which were "quite helpful" Xray lumbar sow severe DDD, spondylosis , LE pain may be related to Lumbar stenosis,   Also has bilateral achilles pain , R>L will order Bilateral ankle braces  Pt states braces not helpful may benefit from cam walker boot in this regard but too bulky given age and balance issues Pain improved 4. Insomnia: continue melatonin and gabapentin 5. Neuropsych/cognition: This patient is capable of making decisions on her own behalf. 6. Skin/Wound Care: Routine pressure relief measures.  7. Fluids/Electrolytes/Nutrition: Monitor [email or web address] elevation of LFTs monitor CMET weekly, only received 650mg  of acetaminophen in last 3 d, may be related to crestor but has been on this for at least 82yr 8. A fib: Monitor HR TID--continue tenormin and Eliquis.  9. HTN: Monitor BP TID- continue Amlodipine, Cozaar and tenormin.  Decrease amlodipine to 5mg  given hypotension Vitals:   12/31/21 1929 01/01/22 0435  BP: (!) 128/55 122/62  Pulse: 68 67  Resp:  17  Temp: 98.2 F (36.8 C) 98 F (36.7 C)  SpO2: 98% 100%  Stable, f/u with PCP 10 AKI- encouraged drinking 6-8 glasses of water per day   -Stable, continue to encourage fluids 11. Bilateral hydronephrosis on KUB: Will repeat KUB. Monitor voiding with PVR/bladder scans.              --d/c ditropan.              --Neurogenic bladder? As has hx of lumbar spondylosis with impingement and recent fall   F/u KUB shows bladder decompressed , kidney fxn normal , prior KUB has evidence of residual contrast showing dilatation of renal  calyces, Normal kidney US 12. Constipation:+BM 12/2  LBM 12/11 13. Recurrent hypokalemia: Supplemented X 1 today.     Latest Ref Rng & Units 01/01/2022    6:29 AM 12/25/2021    7:08 AM 12/21/2021    7:11 AM  BMP  Glucose 70 - 99 mg/dL 500  938  182   BUN 8 - 23 mg/dL 24  19  22    Creatinine 0.44 - 1.00 mg/dL  9.93  7.16   Sodium 135 - 145 mmol/L 135  132  135   Potassium 3.5 - 5.1 mmol/L 4.5  5.0  4.7   Chloride 98 - 111 mmol/L 102  98  98   CO2 22 - 32 mmol/L 26  23  25    Calcium 8.9 - 10.3 mg/dL 8.9  8.9  9.4   K+ wnl 12/4 K+ wnl 12/11  14. H/o Depression/anxiety: continue Zoloft and ativan prn 15. Orthostasis: encouraged patient to drink water in morning before trying to sit up 16. Hyponatremia: will liberalize to regular diet from heart healthy.   -Stable at 135  17.  Dizziness does not appear to be BP related, vestibular eval in progress, if inner ear causes r/o ed then it is likely due to Cerebellar infarct    18.  Insomnia- denies taking sleep med at home but per Home meds was taking ativan BID,monitor effect, slept well last noc , appears clear this am   LOS: 12 days A FACE TO FACE EVALUATION WAS PERFORMED  14/4 01/01/2022, 9:31 AM

## 2022-01-10 DIAGNOSIS — F411 Generalized anxiety disorder: Secondary | ICD-10-CM | POA: Diagnosis not present

## 2022-01-10 DIAGNOSIS — I10 Essential (primary) hypertension: Secondary | ICD-10-CM | POA: Diagnosis not present

## 2022-01-10 DIAGNOSIS — N183 Chronic kidney disease, stage 3 unspecified: Secondary | ICD-10-CM | POA: Diagnosis not present

## 2022-01-10 DIAGNOSIS — I700 Atherosclerosis of aorta: Secondary | ICD-10-CM | POA: Diagnosis not present

## 2022-01-10 DIAGNOSIS — N179 Acute kidney failure, unspecified: Secondary | ICD-10-CM | POA: Diagnosis not present

## 2022-01-10 DIAGNOSIS — D6869 Other thrombophilia: Secondary | ICD-10-CM | POA: Diagnosis not present

## 2022-01-10 DIAGNOSIS — E781 Pure hyperglyceridemia: Secondary | ICD-10-CM | POA: Diagnosis not present

## 2022-01-10 DIAGNOSIS — K219 Gastro-esophageal reflux disease without esophagitis: Secondary | ICD-10-CM | POA: Diagnosis not present

## 2022-01-10 DIAGNOSIS — Z8673 Personal history of transient ischemic attack (TIA), and cerebral infarction without residual deficits: Secondary | ICD-10-CM | POA: Diagnosis not present

## 2022-01-10 DIAGNOSIS — I480 Paroxysmal atrial fibrillation: Secondary | ICD-10-CM | POA: Diagnosis not present

## 2022-01-11 ENCOUNTER — Encounter: Payer: Self-pay | Admitting: Physical Therapy

## 2022-01-11 ENCOUNTER — Ambulatory Visit: Payer: Medicare (Managed Care) | Admitting: Physical Therapy

## 2022-01-11 ENCOUNTER — Encounter: Payer: Self-pay | Admitting: Occupational Therapy

## 2022-01-11 ENCOUNTER — Ambulatory Visit: Payer: Medicare (Managed Care) | Attending: Physical Medicine and Rehabilitation | Admitting: Occupational Therapy

## 2022-01-11 VITALS — BP 161/79 | HR 73

## 2022-01-11 DIAGNOSIS — R278 Other lack of coordination: Secondary | ICD-10-CM

## 2022-01-11 DIAGNOSIS — R269 Unspecified abnormalities of gait and mobility: Secondary | ICD-10-CM | POA: Insufficient documentation

## 2022-01-11 DIAGNOSIS — R2681 Unsteadiness on feet: Secondary | ICD-10-CM | POA: Diagnosis not present

## 2022-01-11 NOTE — Therapy (Addendum)
OUTPATIENT PHYSICAL THERAPY NEURO EVALUATION   Patient Name: [name] MRN: [id number] DOB:06-Nov-1934, 87 y.o., female Today's Date: 01/11/2022   PCP: Blair Heysobert Ehinger REFERRING PROVIDER: Jacquelynn CreeLove, Pamela S, PA-C  END OF SESSION:  PT End of Session - 01/11/22 1429     Visit Number 1    Number of Visits 17    Date for PT Re-Evaluation 03/08/22    Authorization Type [employer]age    PT Start Time 1447    PT Stop Time 1533    PT Time Calculation (min) 46 min    Equipment Utilized During Treatment Gait belt    Activity Tolerance Patient tolerated treatment well    Behavior During Therapy WFL for tasks assessed/performed             Past Medical History:  Diagnosis Date   Arthritis    Atrial fibrillation (HCC)    Carotid artery occlusion    Colitis    COPD (chronic obstructive pulmonary disease) (HCC)    Diverticulitis    Hypertension    Keratosis, seborrheic    Lumbar spondylosis    w/impingement L3-S1   Past Surgical History:  Procedure Laterality Date   ABDOMINAL HYSTERECTOMY     BREAST EXCISIONAL BIOPSY Left    BREAST EXCISIONAL BIOPSY Left    BREAST EXCISIONAL BIOPSY Right    BREAST SURGERY     cyst removal   CAROTID ENDARTERECTOMY Left 2010   CHOLECYSTECTOMY     EYE SURGERY Bilateral 06/2014   cataracts   JOINT REPLACEMENT     RIGHT KNEE   REPLACEMENT TOTAL KNEE Right    Patient Active Problem List   Diagnosis Date Noted   Cerebellar stroke (HCC) 12/20/2021   Acute ischemic stroke (HCC) 12/16/2021   Vertigo 12/14/2021   CVA (cerebral vascular accident) (HCC) 12/14/2021   Nausea and vomiting 12/14/2021   Hypokalemia 12/14/2021   Leukocytosis 12/14/2021   Essential hypertension 12/14/2021   Anxiety 12/14/2021   Hyperlipidemia 12/14/2021   Carotid artery disease (HCC) 12/14/2021   Osteoarthritis cervical spine 09/21/2020   Compression fracture of T12 vertebra (HCC) 04/16/2018   Paroxysmal atrial fibrillation (HCC) 03/03/2018   SBO  (small bowel obstruction) (HCC) 01/26/2017   Encounter for postoperative carotid endarterectomy surveillance 09/21/2015   Occlusion and stenosis of carotid artery without mention of cerebral infarction 02/05/2012    ONSET DATE: 01/01/2022 (referral date)  REFERRING DIAG:  I63.9 (ICD-10-CM) - Cerebral infarction, unspecified    THERAPY DIAG:  Unsteadiness on feet - Plan: PT plan of care cert/re-cert  Abnormality of gait and mobility - Plan: PT plan of care cert/re-cert  Rationale for Evaluation and Treatment: Rehabilitation  SUBJECTIVE:  SUBJECTIVE STATEMENT: Patient prefers to go by [name].  Patient went into the hospital on 12/14/2021 and was found to have a small cerebellar infarct. Patient was seen by physical therapy in inpatient rehab before being discharged on 01/01/2022 for outpatient PT. Patient reports that she feels like her balance is worse since the stroke. She was previously using a SPC outside of house and cane inside of the house. Patient is currently not driving, which she was prior to stroke.   Pt accompanied by: family member - niece [name]  PERTINENT HISTORY:   Taken from 01/01/2022 Note:  Brief HPI:   [name] is a 87 y.o. right-handed female with history of hypertension, PAF, left CEA, COPD, lumbar spondylosis, SBO.  Admitted 12/14/2021 with reports of dizziness and nausea with emesis and inability to get out of bed.  She also reported a fall a week prior to admission and then struck her head without any significant injuries.  She was noted to have atrial fibrillation with elevated blood pressure and potassium 2.8.  CTA head and neck negative for LVO and showed complex plaque left ICA origin with 60% stenosis and evidence of right ICA fibromuscular dysplasia.  MRI of the brain  showed normal brain for age and no acute abnormality.  Scan reviewed by neurology services felt that patient with small cerebellar infarct not seen on films likely embolic due to PAF.  She was started on Eliquis as patient agreeable.  Echo with ejection fraction of 55 to 60%.  KUB showed moderate marked and moderate left hydronephrosis with markedly distended bladder concerning for obstruction with a Foley tube recommended.  Also reported to have horizontal nystagmus with dizziness and movement due to vestibular dysfunction.  On 11/26 she developed A-fib with RVR rates of 170 treated with IV metoprolol.  Therapy evaluations completed due to patient decreased functional mobility was admitted for a comprehensive rehab program.     Hospital Course: [name] was admitted to rehab 12/20/2021 for inpatient therapies to consist of PT, ST and OT at least three hours five days a week. Past admission physiatrist, therapy team and rehab RN have worked together to provide customized collaborative inpatient rehab.  Pertaining to patient's right cerebellar CVA remained stable she remained on Eliquis therapy and would follow-up with neurology services.  Bilateral lower extremity neuropathy with the addition of gabapentin chronic back pain had injections in the past in [place].  X-rays lumbar showed severe DDD spondylosis.  Atrial fibrillation controlled remained on Tenormin as well as Eliquis.  Blood pressure soft and monitored will need outpatient follow-up.  AKI improved with latest creatinine 1.13.  Bilateral hydronephrosis on KUB initially on Ditropan that was discontinued.  Renal ultrasound follow-up showed no hydronephrosis.  Bouts of constipation resolved with laxative assistance.  Mood stabilization maintained on Zoloft with emotional support provided    PAIN:  Are you having pain? Yes: NPRS scale: 4/10 Pain location: neck on L side Pain description: achey Aggravating factors: turning head Relieving factors:  "deep blue" rub  PRECAUTIONS: Fall  WEIGHT BEARING RESTRICTIONS: No  FALLS: Has patient fallen in last 6 months? Yes. Number of falls 1; happened about 10 days before patient had the stroke; patient tripped over family member, she fell on concrete and hit her head  LIVING ENVIRONMENT: Lives with: lives alone Lives in: House/apartment Stairs: Yes: External: 3 steps; on right going up Has following equipment at home: Single point cane, Walker - 2 wheeled, Wheelchair (manual), Shower bench, bed side commode,  and Grab bars  PLOF: Independent  PATIENT GOALS: "Learning to walk by myself." (Referring to walking without walker)  OBJECTIVE:   Vitals:   01/11/22 1501  BP: (!) 161/79  Pulse: 73    DIAGNOSTIC FINDINGS:   See above  COGNITION: Overall cognitive status: Within functional limits for tasks assessed   SENSATION: WFL  COORDINATION: Dymset[place] of LLE with shin slides; RLE WFL  EDEMA:  Yes, mild noted in RLE  MUSCLE TONE: WFL   POSTURE: rounded shoulders and forward head  LOWER EXTREMITY ROM:    WFL for activities performed in session   LOWER EXTREMITY MMT:   4-/5 of L hip flexor and 4/5 of R hip flexor - other general muscle groups only mild weakness  TRANSFERS: Assistive device utilized: Environmental consultant - 2 wheeled  Sit to stand: SBA Stand to sit: Modified independence Chair to chair: SBA  GAIT: Gait pattern: Patient ambulates with increased trunk flexion, decreased stride length, decreased heel strike, decreased hip/knee flexion Distance walked: 2 x 100' during session Assistive device utilized: Environmental consultant - 2 wheeled Level of assistance: SBA  FUNCTIONAL TESTS:  5 times sit to stand: 10.40" requires use of UE to come to stand Timed up and go (TUG): 26.87" with FWW and 26.84" without walker unsteady 10 MWT: 20.61" or 0.48 m/s  PATIENT SURVEYS:  ABC scale 20%  TODAY'S TREATMENT:                                                                                                                                Initial eval - no treatment provided today  PATIENT EDUCATION: Education details: POC, goals, and assessment findings, use of tennis balls for FWW to reduce skid sound, walker height/use, start by slowly increasing walking at home with supervision Person educated: Patient and Caregiver niece Education method: Explanation Education comprehension: verbalized understanding and needs further education  HOME EXERCISE PROGRAM: To be provided - recommended slowly increasing ambulation with supervision in setting with places for seated breaks as needed  GOALS: Goals reviewed with patient? Yes  SHORT TERM GOALS: Target date: 02/08/2022  Patient will demonstrate 100% compliance with initial HEP to continue to progress between physical therapy sessions.   Baseline: To be provided Goal status: INITIAL  2.  Patient will improve TUG score by 3" seconds with LRAD to indicate clinically significant progress towards a decreased risk of falls and improved mobility.  Baseline: 26.87" with FWW and 26.84" without walker unsteady Goal status: INITIAL  3.  Patient will improve ABC Score to 34% or greater to indicate a decreased risk for falls and improved self-reported confidence in balance and sense of steadiness.   Baseline: 20% Goal status: INITIAL  4.  Patient will improve gait speed to 0.8 m/s with LRAD to indicate improvement to the level of community ambulator in order to participate more easily in activities outside of the home.    Baseline: 0.48 m/s Goal status: INITIAL  LONG TERM GOALS: Target date: 03/08/2022    Patient will report demonstrate independence with final HEP in order to maintain current gains and continue to progress after physical therapy discharge.   Baseline: To be provided Goal status: INITIAL  2.  Patient will improve TUG score to 13.5" or less with LRAD to indicate a decreased risk of falls and demonstrate improved overall  mobility.   Baseline: 26.87" with FWW and 26.84" without walker unsteady Goal status: INITIAL  3.  Patient will improve ABC Score to 67% or greater to indicate a decreased risk for falls and improved self-reported confidence in balance and sense of steadiness.   Baseline: 20% Goal status: INITIAL  4.  Patient will improve gait speed to 1.1 m/s or greater to indicate a reduced risk for falls.   Baseline: 0.48 m/s Goal status: INITIAL  5.  Patient will demonstrate ability to come to stand from chair without use of UE to demonstrate improve LE strength.  Baseline: requires bilateral use of UE Goal status: INITIAL   ASSESSMENT:  CLINICAL IMPRESSION: Patient is a 87 y.o. female who was seen today for physical therapy evaluation and treatment for abnormalities of gait and imbalance pertaining to an acute cerebellar infarct. Patient presents with deficits in gait, mobility, balance, and strength. Patient is ambulating below age-reported norms and demonstrating the level of limited community ambulation. She is at an increased risk of falls as indicated by TUG, ABC, FTSTS, and gait speed. Patient was provided tennis balls during session to improve walker glide across floor. She was also educated on safety while also increasing ambulation with supervision at home. Patient will benefit from skilled physical therapy to improve over mobility and safety to increase her independence.  OBJECTIVE IMPAIRMENTS: Abnormal gait, decreased activity tolerance, decreased balance, decreased coordination, decreased mobility, difficulty walking, and decreased strength.   ACTIVITY LIMITATIONS: standing, squatting, stairs, and transfers  PARTICIPATION LIMITATIONS: driving, shopping, and community activity  PERSONAL FACTORS: Age, Past/current experiences, and Transportation are also affecting patient's functional outcome.   REHAB POTENTIAL: Good  CLINICAL DECISION MAKING: Evolving/moderate complexity  EVALUATION  COMPLEXITY: Moderate  PLAN:  PT FREQUENCY: 2x/week  PT DURATION: 8 weeks  PLANNED INTERVENTIONS: Therapeutic exercises, Therapeutic activity, Neuromuscular re-education, Balance training, Gait training, Patient/Family education, and Self Care  PLAN FOR NEXT SESSION: Dynamic balance training, LE strengthening, sit to stands from elevated surface, reactive stepping  Maryruth Eve, PT, DPT  01/11/2022, 4:21 PM

## 2022-01-11 NOTE — Therapy (Signed)
OUTPATIENT OCCUPATIONAL THERAPY NEURO EVALUATION  Patient Name: [name] MRN: [id number] DOB:[birth year], 87 y.o., female Today's Date: 01/11/2022  PCP: [name] REFERRING PROVIDER: LOVE, Frey OF SESSION:  OT End of Session - 01/11/22 1504     Visit Number 1    Number of Visits 1    Authorization Type [employer] - follow Medicare Guidelines    Progress Note Due on Visit 10    OT Start Time 1400    OT Stop Time 1439    OT Time Calculation (min) 39 min    Activity Tolerance Patient tolerated treatment well    Behavior During Therapy WFL for tasks assessed/performed             Past Medical History:  Diagnosis Date   Arthritis    Atrial fibrillation (HCC)    Carotid artery occlusion    Colitis    COPD (chronic obstructive pulmonary disease) (HCC)    Diverticulitis    Hypertension    Keratosis, seborrheic    Lumbar spondylosis    w/impingement L3-S1   Past Surgical History:  Procedure Laterality Date   ABDOMINAL HYSTERECTOMY     BREAST EXCISIONAL BIOPSY Left    BREAST EXCISIONAL BIOPSY Left    BREAST EXCISIONAL BIOPSY Right    BREAST SURGERY     cyst removal   CAROTID ENDARTERECTOMY Left 2010   CHOLECYSTECTOMY     EYE SURGERY Bilateral 06/2014   cataracts   JOINT REPLACEMENT     RIGHT KNEE   REPLACEMENT TOTAL KNEE Right    Patient Active Problem List   Diagnosis Date Noted   Cerebellar stroke ([place]) 12/20/2021   Acute ischemic stroke ([place]) 12/16/2021   Vertigo 12/14/2021   CVA (cerebral vascular accident) ([place]) 12/14/2021   Nausea and vomiting 12/14/2021   Hypokalemia 12/14/2021   Leukocytosis 12/14/2021   Essential hypertension 12/14/2021   Anxiety 12/14/2021   Hyperlipidemia 12/14/2021   Carotid artery disease ([place]) 12/14/2021   Osteoarthritis cervical spine 09/21/2020   Compression fracture of T12 vertebra ([place]) 04/16/2018   Paroxysmal atrial fibrillation ([place]) 03/03/2018   SBO (small bowel obstruction) ([place]) 01/26/2017   Encounter  for postoperative carotid endarterectomy surveillance 09/21/2015   Occlusion and stenosis of carotid artery without mention of cerebral infarction 02/05/2012    ONSET DATE: 12/14/21  REFERRING DIAG: [id number]-10-CMCerebral infarction, unspecified / cerebellar stroke  THERAPY DIAG:  Unsteadiness on feet - Plan: Ot plan of care cert/re-cert  Other lack of coordination - Plan: Ot plan of care cert/re-cert  Rationale for Evaluation and Treatment: Rehabilitation  SUBJECTIVE:   SUBJECTIVE STATEMENT: Patient here with niece [name], and both indicate things are going well Pt accompanied by: family member  PERTINENT HISTORY: HTN, PAF (patient had d/c coumadin due to sister's bleed hx), L-CEA, COPD, lumbar spondylosis, SBO  PRECAUTIONS: Fall  WEIGHT BEARING RESTRICTIONS: No  PAIN:  Are you having pain? No  FALLS: Has patient fallen in last 6 months? No  LIVING ENVIRONMENT: Lives with: lives alone Lives in: House/apartment Stairs: Yes: External: 3 steps; on right going up and on left going up Has following equipment at home: Single point cane, Walker - 2 wheeled, shower chair, and Grab bars  PLOF: Independent with basic ADLs  PATIENT GOALS: Improve balance  OBJECTIVE:   HAND DOMINANCE: Right  ADLs: Overall ADLs: Mod I/Supervision Transfers/ambulation related to ADLs: Walks with RW Eating: Independent Grooming: Independent UB Dressing:  Independent LB Dressing:  Independent Toileting:  Independent Bathing:  Independent Tub Shower  transfers: Nieces pop over for showers - patient independent Equipment: Shower seat with back and Grab bars  IADLs: Shopping: Niece shops for patient Light housekeeping: Niece is completing Meal Prep: Light cooking as prior to stroke Community mobility: Not driving currently  Medication management: Nieces are helping with medication Financial management: Niece is an Airline pilot and is helping Handwriting: 90% legible   POSTURE COMMENTS:   No Significant postural limitations   ACTIVITY TOLERANCE: Activity tolerance: diminished  FUNCTIONAL OUTCOME MEASURES: FOTO: PT measure - still completing  UPPER EXTREMITY ROM:    Active ROM Right eval Left eval  Shoulder flexion [facility]  Shoulder abduction thruout thruout  Shoulder adduction    Shoulder extension    Shoulder internal rotation    Shoulder external rotation    Elbow flexion    Elbow extension    Wrist flexion    Wrist extension    Wrist ulnar deviation    Wrist radial deviation    Wrist pronation    Wrist supination    (Blank rows = not tested)  UPPER EXTREMITY MMT:     MMT Right eval Left eval  Shoulder flexion 4+/5 4+/5  Shoulder abduction Thruout Thruout  Shoulder adduction    Shoulder extension    Shoulder internal rotation    Shoulder external rotation    Middle trapezius    Lower trapezius    Elbow flexion    Elbow extension    Wrist flexion    Wrist extension    Wrist ulnar deviation    Wrist radial deviation    Wrist pronation    Wrist supination    (Blank rows = not tested)  HAND FUNCTION: Grip strength: Right: 41.8 lbs; Left: 37 lbs  COORDINATION: 9 Hole Peg test: Right: 25.16 sec; Left: 30.56 sec  SENSATION: Light touch: WFL   MUSCLE TONE: RUE: Within functional limits and LUE: Within functional limits  COGNITION: Overall cognitive status: Within functional limits for tasks assessed  VISION: Subjective report: No changes Baseline vision: Wears glasses for reading only Visual history: cataracts surgery 2  VISION ASSESSMENT: WFL  Patient has difficulty with following activities due to following visual impairments: NA  PERCEPTION: WFL  PRAXIS: WFL    TODAY'S TREATMENT:                                                                                                                              DATE: 01/11/22 Patient here with her niece [name].  Both report things are going well at home.  Patient has been  completing ADL without assistance, nieces checking in every day, and providing set up/supervision for shower.  Patient has not required any physical assistance for shower.  Nieces are providing transportation and grocery/housekeeping/ and bill pay support for patient currently.  Patient is not currently driving and knows to secure MD permission to drive.     PATIENT EDUCATION: Education details: Eval findings Person educated: Patient and Caregiver niece [name]  Education method: Explanation Education comprehension: verbalized understanding  HOME EXERCISE PROGRAM: NA - Discussed methods to start to introduce patient's pet back into the home, and to reduce supervision/ support for showering   GOALS: NA  ASSESSMENT:  CLINICAL IMPRESSION: Patient is an 87 y.o. female who was seen today for occupational therapy evaluation following cerebellar stroke.   PERFORMANCE DEFICITS: in functional skills including IADLs, cognitive skills including  mild depression/ lack of energy drive  IMPAIRMENTS: are limiting patient from IADLs.   CO-MORBIDITIES: may have co-morbidities  that affects occupational performance. Patient will benefit from skilled OT to address above impairments and improve overall function.  MODIFICATION OR ASSISTANCE TO COMPLETE EVALUATION: No modification of tasks or assist necessary to complete an evaluation.  OT OCCUPATIONAL PROFILE AND HISTORY: Problem focused assessment: Including review of records relating to presenting problem.  CLINICAL DECISION MAKING: LOW - limited treatment options, no task modification necessary  REHAB POTENTIAL: Good  EVALUATION COMPLEXITY: Low    PLAN:  OT FREQUENCY:  NA   PLANNED INTERVENTIONS: Eval only  RECOMMENDED OTHER SERVICES: PT  CONSULTED AND AGREED WITH PLAN OF CARE: Patient and family member/caregiver  PLAN FOR NEXT SESSION: No further OT warranted at this time   Mariah Milling, OT 01/11/2022, 3:04 PM

## 2022-01-12 NOTE — Addendum Note (Signed)
Addended by: Maryruth Eve A on: 01/12/2022 09:17 AM   Modules accepted: Orders

## 2022-01-19 ENCOUNTER — Encounter: Payer: Self-pay | Admitting: Physical Therapy

## 2022-01-19 ENCOUNTER — Ambulatory Visit: Payer: Medicare (Managed Care) | Admitting: Physical Therapy

## 2022-01-19 VITALS — BP 154/91 | HR 67

## 2022-01-19 DIAGNOSIS — R2681 Unsteadiness on feet: Secondary | ICD-10-CM | POA: Diagnosis not present

## 2022-01-19 DIAGNOSIS — R269 Unspecified abnormalities of gait and mobility: Secondary | ICD-10-CM

## 2022-01-19 DIAGNOSIS — R278 Other lack of coordination: Secondary | ICD-10-CM

## 2022-01-19 NOTE — Therapy (Signed)
OUTPATIENT PHYSICAL THERAPY NEURO TREATMENT   Patient Name: [name] MRN: [id number] DOB:October 30, 1934, 87 y.o., female Today's Date: 01/19/2022   PCP: Blair Heysobert Ehinger REFERRING PROVIDER: Jacquelynn CreeLove, Pamela S, PA-C  END OF SESSION:  PT End of Session - 01/19/22 1147     Visit Number 2    Number of Visits 17    Date for PT Re-Evaluation 03/08/22    Authorization Type [employer]vantage    PT Start Time 1145    PT Stop Time 1230    PT Time Calculation (min) 45 min    Equipment Utilized During Treatment Gait belt    Activity Tolerance Patient tolerated treatment well    Behavior During Therapy WFL for tasks assessed/performed             Past Medical History:  Diagnosis Date   Arthritis    Atrial fibrillation (HCC)    Carotid artery occlusion    Colitis    COPD (chronic obstructive pulmonary disease) (HCC)    Diverticulitis    Hypertension    Keratosis, seborrheic    Lumbar spondylosis    w/impingement L3-S1   Past Surgical History:  Procedure Laterality Date   ABDOMINAL HYSTERECTOMY     BREAST EXCISIONAL BIOPSY Left    BREAST EXCISIONAL BIOPSY Left    BREAST EXCISIONAL BIOPSY Right    BREAST SURGERY     cyst removal   CAROTID ENDARTERECTOMY Left 2010   CHOLECYSTECTOMY     EYE SURGERY Bilateral 06/2014   cataracts   JOINT REPLACEMENT     RIGHT KNEE   REPLACEMENT TOTAL KNEE Right    Patient Active Problem List   Diagnosis Date Noted   Cerebellar stroke (HCC) 12/20/2021   Acute ischemic stroke (HCC) 12/16/2021   Vertigo 12/14/2021   CVA (cerebral vascular accident) (HCC) 12/14/2021   Nausea and vomiting 12/14/2021   Hypokalemia 12/14/2021   Leukocytosis 12/14/2021   Essential hypertension 12/14/2021   Anxiety 12/14/2021   Hyperlipidemia 12/14/2021   Carotid artery disease (HCC) 12/14/2021   Osteoarthritis cervical spine 09/21/2020   Compression fracture of T12 vertebra (HCC) 04/16/2018   Paroxysmal atrial fibrillation (HCC) 03/03/2018   SBO  (small bowel obstruction) (HCC) 01/26/2017   Encounter for postoperative carotid endarterectomy surveillance 09/21/2015   Occlusion and stenosis of carotid artery without mention of cerebral infarction 02/05/2012    ONSET DATE: 01/01/2022 (referral date)  REFERRING DIAG:  I63.9 (ICD-10-CM) - Cerebral infarction, unspecified    THERAPY DIAG:  Unsteadiness on feet  Abnormality of gait and mobility  Other lack of coordination  Rationale for Evaluation and Treatment: Rehabilitation  SUBJECTIVE:  SUBJECTIVE STATEMENT: Patient prefers to go by [name] and hears better out of left ear.  Patient reports that she is doing alright. Reports no falls since we last saw her. She has been walking in the house but not about the community. She is still living by herself and her nieces come over to help her with showering. Patient was asking about showering independently but niece reports concern as patient tries to dry her feet off while standing holding onto grab bars.  Pt accompanied by: family member - niece Arline Asp  PERTINENT HISTORY:   Taken from 01/01/2022 Note:  Brief HPI:   [name] is a 87 y.o. right-handed female with history of hypertension, PAF, left CEA, COPD, lumbar spondylosis, SBO.  Admitted 12/14/2021 with reports of dizziness and nausea with emesis and inability to get out of bed.  She also reported a fall a week prior to admission and then struck her head without any significant injuries.  She was noted to have atrial fibrillation with elevated blood pressure and potassium 2.8.  CTA head and neck negative for LVO and showed complex plaque left ICA origin with 60% stenosis and evidence of right ICA fibromuscular dysplasia.  MRI of the brain showed normal brain for age and no acute abnormality.  Scan  reviewed by neurology services felt that patient with small cerebellar infarct not seen on films likely embolic due to PAF.  She was started on Eliquis as patient agreeable.  Echo with ejection fraction of 55 to 60%.  KUB showed moderate marked and moderate left hydronephrosis with markedly distended bladder concerning for obstruction with a Foley tube recommended.  Also reported to have horizontal nystagmus with dizziness and movement due to vestibular dysfunction.  On 11/26 she developed A-fib with RVR rates of 170 treated with IV metoprolol.  Therapy evaluations completed due to patient decreased functional mobility was admitted for a comprehensive rehab program.     Hospital Course: BREONA CHERUBIN was admitted to rehab 12/20/2021 for inpatient therapies to consist of PT, ST and OT at least three hours five days a week. Past admission physiatrist, therapy team and rehab RN have worked together to provide customized collaborative inpatient rehab.  Pertaining to patient's right cerebellar CVA remained stable she remained on Eliquis therapy and would follow-up with neurology services.  Bilateral lower extremity neuropathy with the addition of gabapentin chronic back pain had injections in the past in [place].  X-rays lumbar showed severe DDD spondylosis.  Atrial fibrillation controlled remained on Tenormin as well as Eliquis.  Blood pressure soft and monitored will need outpatient follow-up.  AKI improved with latest creatinine 1.13.  Bilateral hydronephrosis on KUB initially on Ditropan that was discontinued.  Renal ultrasound follow-up showed no hydronephrosis.  Bouts of constipation resolved with laxative assistance.  Mood stabilization maintained on Zoloft with emotional support provided    PAIN:  Are you having pain? Yes: NPRS scale: 4/10 Pain location: neck on L side Pain description: achey Aggravating factors: turning head Relieving factors: "deep blue" rub  PRECAUTIONS: Fall  WEIGHT BEARING  RESTRICTIONS: No  FALLS: Has patient fallen in last 6 months? Yes. Number of falls 1; happened about 10 days before patient had the stroke; patient tripped over family member, she fell on concrete and hit her head  LIVING ENVIRONMENT: Lives with: lives alone Lives in: House/apartment Stairs: Yes: External: 3 steps; on right going up Has following equipment at home: Single point cane, Walker - 2 wheeled, Wheelchair (manual), Shower bench, bed side  commode, and Grab bars  PLOF: Independent  PATIENT GOALS: "Learning to walk by myself." (Referring to walking without walker)  OBJECTIVE:   Vitals:   01/19/22 1153  BP: (!) 154/91  Pulse: 67    DIAGNOSTIC FINDINGS:   See above  TODAY'S TREATMENT:                                                                                                                                Therex: Supine Bridge  - 2 x 10 with double pillow support for tolerance in supine Sit to Stand with Armchair use for ascent and eccentric lower without UE use - 1 x 6 reps with SBA Standing marching with RW + SBA 2 x 20  NMR: Corner Balance Feet Together With Eyes Open 1 x 120" Corner Balance Feet Together With Eyes Closed  - 3 x 30" Standing Tandem in corner -  3 x 30" bilaterally  Self Care: Education with safety in shower; recommended continued caregiver presence given what observed in today's session until we progress patient stability; education on SLS and how exercises performed in today's session are progressions towards increased stability in this area   PATIENT EDUCATION: Education details: HEP + shower safety Person educated: Patient and Caregiver niece Education method: Explanation Education comprehension: verbalized understanding and needs further education  HOME EXERCISE PROGRAM: To be provided - recommended slowly increasing ambulation with supervision in setting with places for seated breaks as needed   Access Code: [email or web address] URL:  [email or web address] Date: 01/19/2022 Prepared by: Maryruth Eve  Exercises - Corner Balance Feet Together With Eyes Open  - 1 x daily - 7 x weekly - 3 sets - 30 hold - Corner Balance Feet Together With Eyes Closed  - 1 x daily - 7 x weekly - 3 sets - 30 hold - Standing March with Counter Support  - 1 x daily - 7 x weekly - 3 sets - 10 reps - Sit to Stand with Armchair  - 1 x daily - 7 x weekly - 3 sets - 5 reps - Standing Tandem Balance with Counter Support  - 1 x daily - 7 x weekly - 3 sets - 30 hold - Supine Bridge  - 1 x daily - 7 x weekly - 2 sets - 10 reps  GOALS: Goals reviewed with patient? Yes  SHORT TERM GOALS: Target date: 02/08/2022  Patient will demonstrate 100% compliance with initial HEP to continue to progress between physical therapy sessions.   Baseline: To be provided Goal status: INITIAL  2.  Patient will improve TUG score by 3" seconds with LRAD to indicate clinically significant progress towards a decreased risk of falls and improved mobility.  Baseline: 26.87" with FWW and 26.84" without walker unsteady Goal status: INITIAL  3.  Patient will improve ABC Score to 34% or greater to indicate a decreased risk for falls and improved self-reported confidence in balance and sense of steadiness.  Baseline: 20% Goal status: INITIAL  4.  Patient will improve gait speed to 0.8 m/s with LRAD to indicate improvement to the level of community ambulator in order to participate more easily in activities outside of the home.    Baseline: 0.48 m/s Goal status: INITIAL  LONG TERM GOALS: Target date: 03/08/2022    Patient will report demonstrate independence with final HEP in order to maintain current gains and continue to progress after physical therapy discharge.   Baseline: To be provided Goal status: INITIAL  2.  Patient will improve TUG score to 13.5" or less with LRAD to indicate a decreased risk of falls and demonstrate improved overall mobility.    Baseline: 26.87" with FWW and 26.84" without walker unsteady Goal status: INITIAL  3.  Patient will improve ABC Score to 67% or greater to indicate a decreased risk for falls and improved self-reported confidence in balance and sense of steadiness.   Baseline: 20% Goal status: INITIAL  4.  Patient will improve gait speed to 1.1 m/s or greater to indicate a reduced risk for falls.   Baseline: 0.48 m/s Goal status: INITIAL  5.  Patient will demonstrate ability to come to stand from chair without use of UE to demonstrate improve LE strength.  Baseline: requires bilateral use of UE Goal status: INITIAL   ASSESSMENT:  CLINICAL IMPRESSION: Session emphasized creation of initial HEP as well as addressed patient/family concerns around balance in shower. Encouraged presence of caregiver at time given current level of stability. Patient demonstrated inability to come to stand without UE but tolerated modified sit to stand in session well. Initially demonstrated poor eccentric control but progressed with repetition. Provided exercises both to be performed with supervision (balance/standing tasks) and those that patient can perform independently. Patient will benefit from skilled physical therapy to improve over mobility and safety to increase her independence.  OBJECTIVE IMPAIRMENTS: Abnormal gait, decreased activity tolerance, decreased balance, decreased coordination, decreased mobility, difficulty walking, and decreased strength.   ACTIVITY LIMITATIONS: standing, squatting, stairs, and transfers  PARTICIPATION LIMITATIONS: driving, shopping, and community activity  PERSONAL FACTORS: Age, Past/current experiences, and Transportation are also affecting patient's functional outcome.   REHAB POTENTIAL: Good  CLINICAL DECISION MAKING: Evolving/moderate complexity  EVALUATION COMPLEXITY: Moderate  PLAN:  PT FREQUENCY: 2x/week  PT DURATION: 8 weeks  PLANNED INTERVENTIONS: Therapeutic  exercises, Therapeutic activity, Neuromuscular re-education, Balance training, Gait training, Patient/Family education, and Self Care  PLAN FOR NEXT SESSION: Dynamic balance training, LE strengthening, sit to stands from elevated surface, reactive stepping; SLS in preparation for showering/step overs etc  Maryruth Eve, PT, DPT  01/19/2022, 1:30 PM

## 2022-01-24 ENCOUNTER — Ambulatory Visit: Payer: Medicare (Managed Care) | Attending: Physical Medicine and Rehabilitation | Admitting: Physical Therapy

## 2022-01-24 ENCOUNTER — Encounter: Payer: Self-pay | Admitting: Physical Therapy

## 2022-01-24 DIAGNOSIS — R278 Other lack of coordination: Secondary | ICD-10-CM | POA: Diagnosis not present

## 2022-01-24 DIAGNOSIS — R2681 Unsteadiness on feet: Secondary | ICD-10-CM | POA: Diagnosis not present

## 2022-01-24 DIAGNOSIS — R269 Unspecified abnormalities of gait and mobility: Secondary | ICD-10-CM | POA: Diagnosis not present

## 2022-01-24 DIAGNOSIS — R293 Abnormal posture: Secondary | ICD-10-CM | POA: Diagnosis not present

## 2022-01-24 DIAGNOSIS — M6281 Muscle weakness (generalized): Secondary | ICD-10-CM | POA: Diagnosis not present

## 2022-01-24 NOTE — Therapy (Signed)
OUTPATIENT PHYSICAL THERAPY NEURO TREATMENT   Patient Name: [name] MRN: [id number] DOB:[birth year], 88 y.o., female Today's Date: 01/24/2022   PCP: [name] REFERRING PROVIDER: Jacquelynn Cree, PA-C  END OF SESSION:  PT End of Session - 01/24/22 1108     Visit Number 3    Number of Visits 17    Date for PT Re-Evaluation 03/08/22    Authorization Type [employer] Advantage    PT Start Time 1105   pt late   PT Stop Time 1145   5 mins not billed due to bathroom   PT Time Calculation (min) 40 min    Equipment Utilized During Treatment Gait belt    Activity Tolerance Patient tolerated treatment well    Behavior During Therapy WFL for tasks assessed/performed              Past Medical History:  Diagnosis Date   Arthritis    Atrial fibrillation (HCC)    Carotid artery occlusion    Colitis    COPD (chronic obstructive pulmonary disease) (HCC)    Diverticulitis    Hypertension    Keratosis, seborrheic    Lumbar spondylosis    w/impingement L3-S1   Past Surgical History:  Procedure Laterality Date   ABDOMINAL HYSTERECTOMY     BREAST EXCISIONAL BIOPSY Left    BREAST EXCISIONAL BIOPSY Left    BREAST EXCISIONAL BIOPSY Right    BREAST SURGERY     cyst removal   CAROTID ENDARTERECTOMY Left 2010   CHOLECYSTECTOMY     EYE SURGERY Bilateral 06/2014   cataracts   JOINT REPLACEMENT     RIGHT KNEE   REPLACEMENT TOTAL KNEE Right    Patient Active Problem List   Diagnosis Date Noted   Cerebellar stroke (HCC) 12/20/2021   Acute ischemic stroke (HCC) 12/16/2021   Vertigo 12/14/2021   CVA (cerebral vascular accident) (HCC) 12/14/2021   Nausea and vomiting 12/14/2021   Hypokalemia 12/14/2021   Leukocytosis 12/14/2021   Essential hypertension 12/14/2021   Anxiety 12/14/2021   Hyperlipidemia 12/14/2021   Carotid artery disease (HCC) 12/14/2021   Osteoarthritis cervical spine 09/21/2020   Compression fracture of T12 vertebra (HCC) 04/16/2018   Paroxysmal  atrial fibrillation (HCC) 03/03/2018   SBO (small bowel obstruction) (HCC) 01/26/2017   Encounter for postoperative carotid endarterectomy surveillance 09/21/2015   Occlusion and stenosis of carotid artery without mention of cerebral infarction 02/05/2012    ONSET DATE: 01/01/2022 (referral date)  REFERRING DIAG:  I63.9 (ICD-10-CM) - Cerebral infarction, unspecified    THERAPY DIAG:  Unsteadiness on feet  Abnormality of gait and mobility  Other lack of coordination  Rationale for Evaluation and Treatment: Rehabilitation  SUBJECTIVE:  SUBJECTIVE STATEMENT: PT orients to patient's left side throughout session as she hears better out of left ear.  Patient reports that she is doing fine. Denies falls since last visit. Patient is still allowing nieces to supervise her showers, she states she has not needed physical assistance with this thus far.  She has been trying the HEP and feels safe doing it, but reports the standing with feet together and eyes closed is horrible feeling. Pt accompanied by: family member - niece [name]  PERTINENT HISTORY:   Taken from 01/01/2022 Note:  Brief HPI:   [name] is a 88 y.o. right-handed female with history of hypertension, PAF, left CEA, COPD, lumbar spondylosis, SBO.  Admitted 12/14/2021 with reports of dizziness and nausea with emesis and inability to get out of bed.  She also reported a fall a week prior to admission and then struck her head without any significant injuries.  She was noted to have atrial fibrillation with elevated blood pressure and potassium 2.8.  CTA head and neck negative for LVO and showed complex plaque left ICA origin with 60% stenosis and evidence of right ICA fibromuscular dysplasia.  MRI of the brain showed normal brain for age and no  acute abnormality.  Scan reviewed by neurology services felt that patient with small cerebellar infarct not seen on films likely embolic due to PAF.  She was started on Eliquis as patient agreeable.  Echo with ejection fraction of 55 to 60%.  KUB showed moderate marked and moderate left hydronephrosis with markedly distended bladder concerning for obstruction with a Foley tube recommended.  Also reported to have horizontal nystagmus with dizziness and movement due to vestibular dysfunction.  On 11/26 she developed A-fib with RVR rates of 170 treated with IV metoprolol.  Therapy evaluations completed due to patient decreased functional mobility was admitted for a comprehensive rehab program.     Hospital Course: [name] was admitted to rehab 12/20/2021 for inpatient therapies to consist of PT, ST and OT at least three hours five days a week. Past admission physiatrist, therapy team and rehab RN have worked together to provide customized collaborative inpatient rehab.  Pertaining to patient's right cerebellar CVA remained stable she remained on Eliquis therapy and would follow-up with neurology services.  Bilateral lower extremity neuropathy with the addition of gabapentin chronic back pain had injections in the past in [place].  X-rays lumbar showed severe DDD spondylosis.  Atrial fibrillation controlled remained on Tenormin as well as Eliquis.  Blood pressure soft and monitored will need outpatient follow-up.  AKI improved with latest creatinine 1.13.  Bilateral hydronephrosis on KUB initially on Ditropan that was discontinued.  Renal ultrasound follow-up showed no hydronephrosis.  Bouts of constipation resolved with laxative assistance.  Mood stabilization maintained on Zoloft with emotional support provided    PAIN:  Are you having pain? No-back was hurting when first waking up, but has resolved.  PRECAUTIONS: Fall  WEIGHT BEARING RESTRICTIONS: No  FALLS: Has patient fallen in last 6 months? Yes.  Number of falls 1; happened about 10 days before patient had the stroke; patient tripped over family member, she fell on concrete and hit her head  LIVING ENVIRONMENT: Lives with: lives alone Lives in: House/apartment Stairs: Yes: External: 3 steps; on right going up Has following equipment at home: Single point cane, Walker - 2 wheeled, Wheelchair (manual), Shower bench, bed side commode, and Grab bars  PLOF: Independent  PATIENT GOALS: "Learning to walk by myself." (Referring to walking without  walker)  OBJECTIVE:   There were no vitals filed for this visit.   DIAGNOSTIC FINDINGS:   See above  TODAY'S TREATMENT:                                                                                                                              -STS 2x5 w/ CGA, pt using hands on knees for repetition, hands on mat for initial rep; cued for midline COG, to prevent holding breath, and eccentric lower. -Alt LE step taps to 2" progressed to 4" step w/ LUE support on RW, pt loses balance requiring maxA to recover when looking up and away from step, denies dizziness w/ head movements, but states this makes her off balance -Standing head nods and rotations x1 min each w/ pt having significant sway w/ head rotation -4" step ups w/ BUE support in // bars 2x5 each LE > static tandem holds alt LE elevated on 4" step progressed to no UE support > tandem elevated hold w/o UE support w/ head turns w/ minA due to postural sway; pt needing to use restroom so PT escorted patient to bathroom x5 mins, not billed. Pt requires seated rest following finishing last balance task w/ reported mild right knee discomfort resolved in sitting.  PATIENT EDUCATION: Education details: Re-iterated to continue HEP and shower safety. Person educated: Patient and Caregiver niece Education method: Explanation Education comprehension: verbalized understanding and needs further education  HOME EXERCISE PROGRAM: To be provided -  recommended slowly increasing ambulation with supervision in setting with places for seated breaks as needed   Access Code: [email or web address] URL: [email or web address] Date: 01/19/2022 Prepared by: Maryruth Eve  Exercises - Corner Balance Feet Together With Eyes Open  - 1 x daily - 7 x weekly - 3 sets - 30 hold - Corner Balance Feet Together With Eyes Closed  - 1 x daily - 7 x weekly - 3 sets - 30 hold - Standing March with Counter Support  - 1 x daily - 7 x weekly - 3 sets - 10 reps - Sit to Stand with Armchair  - 1 x daily - 7 x weekly - 3 sets - 5 reps - Standing Tandem Balance with Counter Support  - 1 x daily - 7 x weekly - 3 sets - 30 hold - Supine Bridge  - 1 x daily - 7 x weekly - 2 sets - 10 reps  GOALS: Goals reviewed with patient? Yes  SHORT TERM GOALS: Target date: 02/08/2022  Patient will demonstrate 100% compliance with initial HEP to continue to progress between physical therapy sessions.   Baseline: To be provided Goal status: INITIAL  2.  Patient will improve TUG score by 3" seconds with LRAD to indicate clinically significant progress towards a decreased risk of falls and improved mobility.  Baseline: 26.87" with FWW and 26.84" without walker unsteady Goal status: INITIAL  3.  Patient will improve ABC Score to 34% or greater to indicate  a decreased risk for falls and improved self-reported confidence in balance and sense of steadiness.   Baseline: 20% Goal status: INITIAL  4.  Patient will improve gait speed to 0.8 m/s with LRAD to indicate improvement to the level of community ambulator in order to participate more easily in activities outside of the home.    Baseline: 0.48 m/s Goal status: INITIAL  LONG TERM GOALS: Target date: 03/08/2022    Patient will report demonstrate independence with final HEP in order to maintain current gains and continue to progress after physical therapy discharge.   Baseline: To be provided Goal status: INITIAL  2.   Patient will improve TUG score to 13.5" or less with LRAD to indicate a decreased risk of falls and demonstrate improved overall mobility.   Baseline: 26.87" with FWW and 26.84" without walker unsteady Goal status: INITIAL  3.  Patient will improve ABC Score to 67% or greater to indicate a decreased risk for falls and improved self-reported confidence in balance and sense of steadiness.   Baseline: 20% Goal status: INITIAL  4.  Patient will improve gait speed to 1.1 m/s or greater to indicate a reduced risk for falls.   Baseline: 0.48 m/s Goal status: INITIAL  5.  Patient will demonstrate ability to come to stand from chair without use of UE to demonstrate improve LE strength.  Baseline: requires bilateral use of UE Goal status: INITIAL   ASSESSMENT:  CLINICAL IMPRESSION: Emphasis of skilled session on continuing LE strengthening and NMR to improve static balance.  Pt is most unstable in all positions when performing head turns.  She also demonstrates instability when looking away from task, but this appears to be more focus related vs head movement.  She continues to benefit from skilled PT to further challenge and improve safety and independence with upright and functional mobility.  OBJECTIVE IMPAIRMENTS: Abnormal gait, decreased activity tolerance, decreased balance, decreased coordination, decreased mobility, difficulty walking, and decreased strength.   ACTIVITY LIMITATIONS: standing, squatting, stairs, and transfers  PARTICIPATION LIMITATIONS: driving, shopping, and community activity  PERSONAL FACTORS: Age, Past/current experiences, and Transportation are also affecting patient's functional outcome.   REHAB POTENTIAL: Good  CLINICAL DECISION MAKING: Evolving/moderate complexity  EVALUATION COMPLEXITY: Moderate  PLAN:  PT FREQUENCY: 2x/week  PT DURATION: 8 weeks  PLANNED INTERVENTIONS: Therapeutic exercises, Therapeutic activity, Neuromuscular re-education, Balance  training, Gait training, Patient/Family education, and Self Care  PLAN FOR NEXT SESSION: Dynamic balance training, LE strengthening, sit to stands from elevated surface, reactive stepping; SLS in preparation for showering/step overs etc, head turns, cognitive dual tasking  Elease Etienne, PT, DPT  01/24/2022, 5:09 PM

## 2022-01-26 ENCOUNTER — Encounter: Payer: Self-pay | Admitting: Physical Therapy

## 2022-01-26 ENCOUNTER — Ambulatory Visit: Payer: Medicare (Managed Care) | Admitting: Physical Therapy

## 2022-01-26 VITALS — BP 115/62 | HR 68

## 2022-01-26 DIAGNOSIS — R2681 Unsteadiness on feet: Secondary | ICD-10-CM

## 2022-01-26 DIAGNOSIS — R269 Unspecified abnormalities of gait and mobility: Secondary | ICD-10-CM

## 2022-01-26 DIAGNOSIS — R278 Other lack of coordination: Secondary | ICD-10-CM

## 2022-01-26 DIAGNOSIS — M6281 Muscle weakness (generalized): Secondary | ICD-10-CM

## 2022-01-26 NOTE — Therapy (Signed)
OUTPATIENT PHYSICAL THERAPY NEURO TREATMENT   Patient Name: [name] MRN: [id number] DOB:[birth year], 88 y.o., female Today's Date: 01/26/2022   PCP: [name] REFERRING PROVIDER: Bary Leriche, PA-C  END OF SESSION:  PT End of Session - 01/26/22 1224     Visit Number 4    Number of Visits 17    Date for PT Re-Evaluation 03/08/22    Authorization Type [employer] Advantage    PT Start Time 1225    PT Stop Time 1311    PT Time Calculation (min) 46 min    Equipment Utilized During Treatment Gait belt    Activity Tolerance Patient tolerated treatment well    Behavior During Therapy WFL for tasks assessed/performed              Past Medical History:  Diagnosis Date   Arthritis    Atrial fibrillation (HCC)    Carotid artery occlusion    Colitis    COPD (chronic obstructive pulmonary disease) ([place])    Diverticulitis    Hypertension    Keratosis, seborrheic    Lumbar spondylosis    w/impingement L3-S1   Past Surgical History:  Procedure Laterality Date   ABDOMINAL HYSTERECTOMY     BREAST EXCISIONAL BIOPSY Left    BREAST EXCISIONAL BIOPSY Left    BREAST EXCISIONAL BIOPSY Right    BREAST SURGERY     cyst removal   CAROTID ENDARTERECTOMY Left 2010   CHOLECYSTECTOMY     EYE SURGERY Bilateral 06/2014   cataracts   JOINT REPLACEMENT     RIGHT KNEE   REPLACEMENT TOTAL KNEE Right    Patient Active Problem List   Diagnosis Date Noted   Cerebellar stroke ([place]) 12/20/2021   Acute ischemic stroke ([place]) 12/16/2021   Vertigo 12/14/2021   CVA (cerebral vascular accident) ([place]) 12/14/2021   Nausea and vomiting 12/14/2021   Hypokalemia 12/14/2021   Leukocytosis 12/14/2021   Essential hypertension 12/14/2021   Anxiety 12/14/2021   Hyperlipidemia 12/14/2021   Carotid artery disease ([place]) 12/14/2021   Osteoarthritis cervical spine 09/21/2020   Compression fracture of T12 vertebra ([place]) 04/16/2018   Paroxysmal atrial fibrillation ([place]) 03/03/2018   SBO  (small bowel obstruction) ([place]) 01/26/2017   Encounter for postoperative carotid endarterectomy surveillance 09/21/2015   Occlusion and stenosis of carotid artery without mention of cerebral infarction 02/05/2012    ONSET DATE: 01/01/2022 (referral date)  REFERRING DIAG:  I63.9 (ICD-10-CM) - Cerebral infarction, unspecified    THERAPY DIAG:  Unsteadiness on feet  Abnormality of gait and mobility  Other lack of coordination  Muscle weakness (generalized)  Rationale for Evaluation and Treatment: Rehabilitation  SUBJECTIVE:  SUBJECTIVE STATEMENT: PT orients to patient's left side throughout session as she hears better out of left ear.  Patient reports that she is doing pretty good. She reports that her L knee has is sore along the medial joint line today when she presses on it but it doesn't hurt when she presses on it. Patient reports that her home program is going well but eyes closed balance continues to be most challenging. Family is still present when patient is showering. Denies falls or changes to medication since last visit. Patient asked about a small well behaved dog staying with her and therapist reported concern about balance but difficult to make decision without knowing dog. Patient stated she would defer to her niece to help decide.   Pt accompanied by: family member - niece [name] in lobby only  PERTINENT HISTORY:   Taken from 01/01/2022 Note:  Brief HPI:   [name] is a 88 y.o. right-handed female with history of hypertension, PAF, left CEA, COPD, lumbar spondylosis, SBO.  Admitted 12/14/2021 with reports of dizziness and nausea with emesis and inability to get out of bed.  She also reported a fall a week prior to admission and then struck her head without any significant injuries.   She was noted to have atrial fibrillation with elevated blood pressure and potassium 2.8.  CTA head and neck negative for LVO and showed complex plaque left ICA origin with 60% stenosis and evidence of right ICA fibromuscular dysplasia.  MRI of the brain showed normal brain for age and no acute abnormality.  Scan reviewed by neurology services felt that patient with small cerebellar infarct not seen on films likely embolic due to PAF.  She was started on Eliquis as patient agreeable.  Echo with ejection fraction of 55 to 60%.  KUB showed moderate marked and moderate left hydronephrosis with markedly distended bladder concerning for obstruction with a Foley tube recommended.  Also reported to have horizontal nystagmus with dizziness and movement due to vestibular dysfunction.  On 11/26 she developed A-fib with RVR rates of 170 treated with IV metoprolol.  Therapy evaluations completed due to patient decreased functional mobility was admitted for a comprehensive rehab program.     Hospital Course: [name] was admitted to rehab 12/20/2021 for inpatient therapies to consist of PT, ST and OT at least three hours five days a week. Past admission physiatrist, therapy team and rehab RN have worked together to provide customized collaborative inpatient rehab.  Pertaining to patient's right cerebellar CVA remained stable she remained on Eliquis therapy and would follow-up with neurology services.  Bilateral lower extremity neuropathy with the addition of gabapentin chronic back pain had injections in the past in [place].  X-rays lumbar showed severe DDD spondylosis.  Atrial fibrillation controlled remained on Tenormin as well as Eliquis.  Blood pressure soft and monitored will need outpatient follow-up.  AKI improved with latest creatinine 1.13.  Bilateral hydronephrosis on KUB initially on Ditropan that was discontinued.  Renal ultrasound follow-up showed no hydronephrosis.  Bouts of constipation resolved with  laxative assistance.  Mood stabilization maintained on Zoloft with emotional support provided    PAIN:  Are you having pain? Yes: NPRS scale: 5/10 Pain location: medial knee joint line Pain description: achey Aggravating factors: movement Relieving factors: rest  PRECAUTIONS: Fall  WEIGHT BEARING RESTRICTIONS: No  FALLS: Has patient fallen in last 6 months? Yes. Number of falls 1; happened about 10 days before patient had the stroke; patient tripped over family member,  she fell on concrete and hit her head  LIVING ENVIRONMENT: Lives with: lives alone Lives in: House/apartment Stairs: Yes: External: 3 steps; on right going up Has following equipment at home: Single point cane, Environmental consultant - 2 wheeled, Wheelchair (manual), Shower bench, bed side commode, and Grab bars  PLOF: Independent  PATIENT GOALS: "Learning to walk by myself." (Referring to walking without walker)  OBJECTIVE:   Vitals:   01/26/22 1240  BP: 115/62  Pulse: 68     DIAGNOSTIC FINDINGS:   See above  TODAY'S TREATMENT:                                                                                                                               Therex: -STS 3x5 w/ CGA, performed from low mat with thigh assist or no UE use intermittently; cues for neutral knee alignment to prevent knee valgus  NMR: -Alt LE step taps to dots on ground for compliant surface challenge and modified SLS with lateral stepping in between; 5 dots x 2 taps x 3 sets (CGA-minA) -Alt LE step tap holds for 3-5" to dots on ground for compliant surface challenge and modified SLS; 5 dots x 1 tap hold x 3 sets (CGA-minA) -1x Fwd backward steps with x 2 vertical head turns x 8 sets with (CGA-minA)  TUG trials without AD + CGA: 1: 31 seconds 2: 29.9 seconds 3: 28.8" seconds  PATIENT EDUCATION: Education details: Re-iterated to continue HEP and shower safety. Person educated: Patient and Caregiver niece Education method:  Explanation Education comprehension: verbalized understanding and needs further education  HOME EXERCISE PROGRAM: To be provided - recommended slowly increasing ambulation with supervision in setting with places for seated breaks as needed   Access Code: [email or web address] URL: [email or web address] Date: 01/19/2022 Prepared by: Malachi Carl  Exercises - Corner Balance Feet Together With Eyes Open  - 1 x daily - 7 x weekly - 3 sets - 30 hold - Corner Balance Feet Together With Eyes Closed  - 1 x daily - 7 x weekly - 3 sets - 30 hold - Standing March with Counter Support  - 1 x daily - 7 x weekly - 3 sets - 10 reps - Sit to Stand with Armchair  - 1 x daily - 7 x weekly - 3 sets - 5 reps - Standing Tandem Balance with Counter Support  - 1 x daily - 7 x weekly - 3 sets - 30 hold - Supine Bridge  - 1 x daily - 7 x weekly - 2 sets - 10 reps  GOALS: Goals reviewed with patient? Yes  SHORT TERM GOALS: Target date: 02/08/2022  Patient will demonstrate 100% compliance with initial HEP to continue to progress between physical therapy sessions.   Baseline: To be provided Goal status: INITIAL  2.  Patient will improve TUG score by 3" seconds with LRAD to indicate clinically significant progress towards a decreased risk of falls and improved mobility.  Baseline:  26.87" with FWW and 26.84" without walker unsteady Goal status: INITIAL  3.  Patient will improve ABC Score to 34% or greater to indicate a decreased risk for falls and improved self-reported confidence in balance and sense of steadiness.   Baseline: 20% Goal status: INITIAL  4.  Patient will improve gait speed to 0.8 m/s with LRAD to indicate improvement to the level of community ambulator in order to participate more easily in activities outside of the home.    Baseline: 0.48 m/s Goal status: INITIAL  LONG TERM GOALS: Target date: 03/08/2022    Patient will report demonstrate independence with final HEP in order to maintain  current gains and continue to progress after physical therapy discharge.   Baseline: To be provided Goal status: INITIAL  2.  Patient will improve TUG score to 13.5" or less with LRAD to indicate a decreased risk of falls and demonstrate improved overall mobility.   Baseline: 26.87" with FWW and 26.84" without walker unsteady Goal status: INITIAL  3.  Patient will improve ABC Score to 67% or greater to indicate a decreased risk for falls and improved self-reported confidence in balance and sense of steadiness.   Baseline: 20% Goal status: INITIAL  4.  Patient will improve gait speed to 1.1 m/s or greater to indicate a reduced risk for falls.   Baseline: 0.48 m/s Goal status: INITIAL  5.  Patient will demonstrate ability to come to stand from chair without use of UE to demonstrate improve LE strength.  Baseline: requires bilateral use of UE Goal status: INITIAL   ASSESSMENT:  CLINICAL IMPRESSION: Emphasis of skilled session on continuing to progress LE strength and balance. Patient reports L knee pain that is 5/10 at end of session along medial joint line. Will continue to monitor and modify treatments/HEP as necessary. Patient fatigues quickly with standing endurance and requires multiple seated rest breaks. Requires CGA-minA for modified SLS activities. She continues to benefit from skilled PT to further challenge and improve safety and independence with upright and functional mobility.  OBJECTIVE IMPAIRMENTS: Abnormal gait, decreased activity tolerance, decreased balance, decreased coordination, decreased mobility, difficulty walking, and decreased strength.   ACTIVITY LIMITATIONS: standing, squatting, stairs, and transfers  PARTICIPATION LIMITATIONS: driving, shopping, and community activity  PERSONAL FACTORS: Age, Past/current experiences, and Transportation are also affecting patient's functional outcome.   REHAB POTENTIAL: Good  CLINICAL DECISION MAKING: Evolving/moderate  complexity  EVALUATION COMPLEXITY: Moderate  PLAN:  PT FREQUENCY: 2x/week  PT DURATION: 8 weeks  PLANNED INTERVENTIONS: Therapeutic exercises, Therapeutic activity, Neuromuscular re-education, Balance training, Gait training, Patient/Family education, and Self Care  PLAN FOR NEXT SESSION: Dynamic balance training, LE strengthening, sit to stands from elevated surface, reactive stepping; SLS in preparation for showering/step overs etc, head turns, cognitive dual tasking; endurance tasks for standing/gait  Malachi Carl, PT, DPT 01/26/2022, 1:38 PM

## 2022-01-31 ENCOUNTER — Ambulatory Visit: Payer: Medicare (Managed Care) | Admitting: Physical Therapy

## 2022-01-31 DIAGNOSIS — R2681 Unsteadiness on feet: Secondary | ICD-10-CM | POA: Diagnosis not present

## 2022-01-31 DIAGNOSIS — R269 Unspecified abnormalities of gait and mobility: Secondary | ICD-10-CM

## 2022-01-31 DIAGNOSIS — R293 Abnormal posture: Secondary | ICD-10-CM

## 2022-01-31 NOTE — Therapy (Signed)
OUTPATIENT PHYSICAL THERAPY NEURO TREATMENT   Patient Name: [name] MRN: [id number] DOB:08/29/1934, 88 y.o., female Today's Date: 01/31/2022   PCP: [name] REFERRING PROVIDER: Bary Leriche, PA-C  END OF SESSION:  PT End of Session - 01/31/22 1433     Visit Number 5    Number of Visits 17    Date for PT Re-Evaluation 03/08/22    Authorization Type [employer] Medicare Advantage    PT Start Time 1431    PT Stop Time 1512    PT Time Calculation (min) 41 min    Equipment Utilized During Treatment Gait belt    Activity Tolerance Patient tolerated treatment well    Behavior During Therapy WFL for tasks assessed/performed               Past Medical History:  Diagnosis Date   Arthritis    Atrial fibrillation (HCC)    Carotid artery occlusion    Colitis    COPD (chronic obstructive pulmonary disease) (HCC)    Diverticulitis    Hypertension    Keratosis, seborrheic    Lumbar spondylosis    w/impingement L3-S1   Past Surgical History:  Procedure Laterality Date   ABDOMINAL HYSTERECTOMY     BREAST EXCISIONAL BIOPSY Left    BREAST EXCISIONAL BIOPSY Left    BREAST EXCISIONAL BIOPSY Right    BREAST SURGERY     cyst removal   CAROTID ENDARTERECTOMY Left 2010   CHOLECYSTECTOMY     EYE SURGERY Bilateral 06/2014   cataracts   JOINT REPLACEMENT     RIGHT KNEE   REPLACEMENT TOTAL KNEE Right    Patient Active Problem List   Diagnosis Date Noted   Cerebellar stroke ([place]) 12/20/2021   Acute ischemic stroke ([place]) 12/16/2021   Vertigo 12/14/2021   CVA (cerebral vascular accident) ([place]) 12/14/2021   Nausea and vomiting 12/14/2021   Hypokalemia 12/14/2021   Leukocytosis 12/14/2021   Essential hypertension 12/14/2021   Anxiety 12/14/2021   Hyperlipidemia 12/14/2021   Carotid artery disease ([place]) 12/14/2021   Osteoarthritis cervical spine 09/21/2020   Compression fracture of T12 vertebra ([place]) 04/16/2018   Paroxysmal atrial fibrillation ([place]) 03/03/2018   SBO  (small bowel obstruction) ([place]) 01/26/2017   Encounter for postoperative carotid endarterectomy surveillance 09/21/2015   Occlusion and stenosis of carotid artery without mention of cerebral infarction 02/05/2012    ONSET DATE: 01/01/2022 (referral date)  REFERRING DIAG:  I63.9 (ICD-10-CM) - Cerebral infarction, unspecified    THERAPY DIAG:  Unsteadiness on feet  Abnormality of gait and mobility  Abnormal posture  Rationale for Evaluation and Treatment: Rehabilitation  SUBJECTIVE:  SUBJECTIVE STATEMENT: PT orients to patient's left side throughout session as she hears better out of left ear.  Patient reports that she is doing pretty good. She reports that her R knee is "yucky" today, worse in the morning. No changes since last visit.   Pt accompanied by: family member - niece [name] in lobby only  PERTINENT HISTORY:   Taken from 01/01/2022 Note:  Brief HPI:   [name] is a 88 y.o. right-handed female with history of hypertension, PAF, left CEA, COPD, lumbar spondylosis, SBO.  Admitted 12/14/2021 with reports of dizziness and nausea with emesis and inability to get out of bed.  She also reported a fall a week prior to admission and then struck her head without any significant injuries.  She was noted to have atrial fibrillation with elevated blood pressure and potassium 2.8.  CTA head and neck negative for LVO and showed complex plaque left ICA origin with 60% stenosis and evidence of right ICA fibromuscular dysplasia.  MRI of the brain showed normal brain for age and no acute abnormality.  Scan reviewed by neurology services felt that patient with small cerebellar infarct not seen on films likely embolic due to PAF.  She was started on Eliquis as patient agreeable.  Echo with ejection fraction of  55 to 60%.  KUB showed moderate marked and moderate left hydronephrosis with markedly distended bladder concerning for obstruction with a Foley tube recommended.  Also reported to have horizontal nystagmus with dizziness and movement due to vestibular dysfunction.  On 11/26 she developed A-fib with RVR rates of 170 treated with IV metoprolol.  Therapy evaluations completed due to patient decreased functional mobility was admitted for a comprehensive rehab program.     Hospital Course: [name] was admitted to rehab 12/20/2021 for inpatient therapies to consist of PT, ST and OT at least three hours five days a week. Past admission physiatrist, therapy team and rehab RN have worked together to provide customized collaborative inpatient rehab.  Pertaining to patient's right cerebellar CVA remained stable she remained on Eliquis therapy and would follow-up with neurology services.  Bilateral lower extremity neuropathy with the addition of gabapentin chronic back pain had injections in the past in [place].  X-rays lumbar showed severe DDD spondylosis.  Atrial fibrillation controlled remained on Tenormin as well as Eliquis.  Blood pressure soft and monitored will need outpatient follow-up.  AKI improved with latest creatinine 1.13.  Bilateral hydronephrosis on KUB initially on Ditropan that was discontinued.  Renal ultrasound follow-up showed no hydronephrosis.  Bouts of constipation resolved with laxative assistance.  Mood stabilization maintained on Zoloft with emotional support provided    PAIN:  Are you having pain? No  PRECAUTIONS: Fall  WEIGHT BEARING RESTRICTIONS: No  FALLS: Has patient fallen in last 6 months? Yes. Number of falls 1; happened about 10 days before patient had the stroke; patient tripped over family member, she fell on concrete and hit her head  LIVING ENVIRONMENT: Lives with: lives alone Lives in: House/apartment Stairs: Yes: External: 3 steps; on right going up Has following  equipment at home: Single point cane, Walker - 2 wheeled, Wheelchair (manual), Shower bench, bed side commode, and Grab bars  PLOF: Independent  PATIENT GOALS: "Learning to walk by myself." (Referring to walking without walker)  OBJECTIVE:   There were no vitals filed for this visit.    DIAGNOSTIC FINDINGS:   See above  TODAY'S TREATMENT:  Ther Ex   SciFit multi-peaks level 2 for 8 minutes using BUE/BLEs for neural priming for reciprocal movement, dynamic cardiovascular conditioning and BLE strength. Min cues to maintain steps/min at 65-70. Pt required seated rest break every 2 minutes but did not report knee pain. RPE of 3/10 following activity.   NMR  In // bars for improved single leg stability, LE coordination and activity tolerance: Fwd 4" hurdle navigation using step-to pattern, x4 down and back, w/intermittent UE support. Noted increased difficulty clearing hurdle when leading w/RLE >LLE. Lateral 4" hurdle navigation w/intermittent UE support, x2 each direction. Pt able to perform lateral stepping easier than in fwd direction  On blue airex, standing without UE support x2 minutes. Pt dancing to music throughout and minimally lost balance posteriorly 3-4 times, requiring min A to stabilize. Noted minor L/R sway and narrow BOS, but no major LOB noted.  On airex, ball tosses x3 minutes for improved reaching out of BOS, ankle strategy and activity tolerance. Pt performed well and demonstrated occasional posterior sway, requiring CGA-min A to stabilize throughout.                                                                                                 PATIENT EDUCATION: Education details: Continue HEP  Person educated: Patient and Caregiver niece Education method: Explanation Education comprehension: verbalized understanding and needs further education  HOME EXERCISE PROGRAM: To be provided - recommended slowly increasing ambulation with supervision in setting  with places for seated breaks as needed   Access Code: [email or web address] URL: [email or web address] Date: 01/19/2022 Prepared by: Malachi Carl  Exercises - Corner Balance Feet Together With Eyes Open  - 1 x daily - 7 x weekly - 3 sets - 30 hold - Corner Balance Feet Together With Eyes Closed  - 1 x daily - 7 x weekly - 3 sets - 30 hold - Standing March with Counter Support  - 1 x daily - 7 x weekly - 3 sets - 10 reps - Sit to Stand with Armchair  - 1 x daily - 7 x weekly - 3 sets - 5 reps - Standing Tandem Balance with Counter Support  - 1 x daily - 7 x weekly - 3 sets - 30 hold - Supine Bridge  - 1 x daily - 7 x weekly - 2 sets - 10 reps  GOALS: Goals reviewed with patient? Yes  SHORT TERM GOALS: Target date: 02/08/2022  Patient will demonstrate 100% compliance with initial HEP to continue to progress between physical therapy sessions.   Baseline: To be provided Goal status: INITIAL  2.  Patient will improve TUG score by 3" seconds with LRAD to indicate clinically significant progress towards a decreased risk of falls and improved mobility.  Baseline: 26.87" with FWW and 26.84" without walker unsteady Goal status: INITIAL  3.  Patient will improve ABC Score to 34% or greater to indicate a decreased risk for falls and improved self-reported confidence in balance and sense of steadiness.   Baseline: 20% Goal status: INITIAL  4.  Patient will improve gait speed to 0.8 m/s with LRAD to  indicate improvement to the level of community ambulator in order to participate more easily in activities outside of the home.    Baseline: 0.48 m/s Goal status: INITIAL  LONG TERM GOALS: Target date: 03/08/2022    Patient will report demonstrate independence with final HEP in order to maintain current gains and continue to progress after physical therapy discharge.   Baseline: To be provided Goal status: INITIAL  2.  Patient will improve TUG score to 13.5" or less with LRAD to indicate a  decreased risk of falls and demonstrate improved overall mobility.   Baseline: 26.87" with FWW and 26.84" without walker unsteady Goal status: INITIAL  3.  Patient will improve ABC Score to 67% or greater to indicate a decreased risk for falls and improved self-reported confidence in balance and sense of steadiness.   Baseline: 20% Goal status: INITIAL  4.  Patient will improve gait speed to 1.1 m/s or greater to indicate a reduced risk for falls.   Baseline: 0.48 m/s Goal status: INITIAL  5.  Patient will demonstrate ability to come to stand from chair without use of UE to demonstrate improve LE strength.  Baseline: requires bilateral use of UE Goal status: INITIAL   ASSESSMENT:  CLINICAL IMPRESSION: Emphasis of skilled session on improved endurance, LE coordination, single leg stability and dynamic balance. Pt tolerated session well with no increase in pain. Pt did require frequent rest breaks on SciFit but was able to maintain >80 steps/min throughout. Pt assumes very narrow BOS, likely contributing to her imbalance on unlevel surfaces. However, pt able to perform activities during session w/light UE support. Continue POC.   OBJECTIVE IMPAIRMENTS: Abnormal gait, decreased activity tolerance, decreased balance, decreased coordination, decreased mobility, difficulty walking, and decreased strength.   ACTIVITY LIMITATIONS: standing, squatting, stairs, and transfers  PARTICIPATION LIMITATIONS: driving, shopping, and community activity  PERSONAL FACTORS: Age, Past/current experiences, and Transportation are also affecting patient's functional outcome.   REHAB POTENTIAL: Good  CLINICAL DECISION MAKING: Evolving/moderate complexity  EVALUATION COMPLEXITY: Moderate  PLAN:  PT FREQUENCY: 2x/week  PT DURATION: 8 weeks  PLANNED INTERVENTIONS: Therapeutic exercises, Therapeutic activity, Neuromuscular re-education, Balance training, Gait training, Patient/Family education, and Self  Care  PLAN FOR NEXT SESSION: Dynamic balance training, LE strengthening, sit to stands from elevated surface, reactive stepping; SLS in preparation for showering/step overs etc, head turns, cognitive dual tasking; endurance tasks for standing/gait, rockerboard and airex tasks    Josephine Igo, PT, DPT [facility] [address] [place], [place]  [address] Phone:  [phone] Fax:  [phone] 01/31/2022, 3:12 PM

## 2022-02-02 ENCOUNTER — Ambulatory Visit: Payer: Medicare (Managed Care) | Admitting: Physical Therapy

## 2022-02-02 ENCOUNTER — Encounter: Payer: Self-pay | Admitting: Physical Therapy

## 2022-02-02 VITALS — BP 150/73 | HR 56

## 2022-02-02 DIAGNOSIS — R278 Other lack of coordination: Secondary | ICD-10-CM

## 2022-02-02 DIAGNOSIS — R2681 Unsteadiness on feet: Secondary | ICD-10-CM | POA: Diagnosis not present

## 2022-02-02 DIAGNOSIS — M6281 Muscle weakness (generalized): Secondary | ICD-10-CM

## 2022-02-02 DIAGNOSIS — R269 Unspecified abnormalities of gait and mobility: Secondary | ICD-10-CM

## 2022-02-02 NOTE — Therapy (Signed)
OUTPATIENT PHYSICAL THERAPY NEURO TREATMENT   Patient Name: [name] MRN: [id number] DOB:[birth year], 88 y.o., female Today's Date: 02/02/2022   PCP: [name] REFERRING PROVIDER: Jacquelynn Cree, PA-C  END OF SESSION:  PT End of Session - 02/02/22 1320     Visit Number 6    Number of Visits 17    Date for PT Re-Evaluation 03/08/22    Authorization Type [employer] Advantage    PT Start Time 1319    PT Stop Time 1400    PT Time Calculation (min) 41 min    Equipment Utilized During Treatment Gait belt    Activity Tolerance Patient tolerated treatment well    Behavior During Therapy WFL for tasks assessed/performed             Past Medical History:  Diagnosis Date   Arthritis    Atrial fibrillation (HCC)    Carotid artery occlusion    Colitis    COPD (chronic obstructive pulmonary disease) (HCC)    Diverticulitis    Hypertension    Keratosis, seborrheic    Lumbar spondylosis    w/impingement L3-S1   Past Surgical History:  Procedure Laterality Date   ABDOMINAL HYSTERECTOMY     BREAST EXCISIONAL BIOPSY Left    BREAST EXCISIONAL BIOPSY Left    BREAST EXCISIONAL BIOPSY Right    BREAST SURGERY     cyst removal   CAROTID ENDARTERECTOMY Left 2010   CHOLECYSTECTOMY     EYE SURGERY Bilateral 06/2014   cataracts   JOINT REPLACEMENT     RIGHT KNEE   REPLACEMENT TOTAL KNEE Right    Patient Active Problem List   Diagnosis Date Noted   Cerebellar stroke (HCC) 12/20/2021   Acute ischemic stroke (HCC) 12/16/2021   Vertigo 12/14/2021   CVA (cerebral vascular accident) (HCC) 12/14/2021   Nausea and vomiting 12/14/2021   Hypokalemia 12/14/2021   Leukocytosis 12/14/2021   Essential hypertension 12/14/2021   Anxiety 12/14/2021   Hyperlipidemia 12/14/2021   Carotid artery disease (HCC) 12/14/2021   Osteoarthritis cervical spine 09/21/2020   Compression fracture of T12 vertebra (HCC) 04/16/2018   Paroxysmal atrial fibrillation (HCC) 03/03/2018   SBO (small  bowel obstruction) (HCC) 01/26/2017   Encounter for postoperative carotid endarterectomy surveillance 09/21/2015   Occlusion and stenosis of carotid artery without mention of cerebral infarction 02/05/2012    ONSET DATE: 01/01/2022 (referral date)  REFERRING DIAG:  I63.9 (ICD-10-CM) - Cerebral infarction, unspecified    THERAPY DIAG:  Unsteadiness on feet  Abnormality of gait and mobility  Other lack of coordination  Muscle weakness (generalized)  Rationale for Evaluation and Treatment: Rehabilitation  SUBJECTIVE:  SUBJECTIVE STATEMENT: PT orients to patient's left side throughout session as she hears better out of left ear. Patient also reports wanting to have her BP assessed each session if possible as she is logging it for her PCP.  Patient reports she is doing alright. Reports that some days she feels like her balance is worse than others and states that finds herself having to lean against the wall with her walker for stability. Patient has a dog staying with her on Saturday who is well trained and family member present reports feeling comfortable with the setup. Exercises at home are going well. She has the hardest time with tandem balance and reports EC balance is going suprisingly well at this time.   Pt accompanied by: family member - [name]  PERTINENT HISTORY:   Taken from 01/01/2022 Note:  Brief HPI:   [name] is a 88 y.o. right-handed female with history of hypertension, PAF, left CEA, COPD, lumbar spondylosis, SBO.  Admitted 12/14/2021 with reports of dizziness and nausea with emesis and inability to get out of bed.  She also reported a fall a week prior to admission and then struck her head without any significant injuries.  She was noted to have atrial fibrillation with elevated  blood pressure and potassium 2.8.  CTA head and neck negative for LVO and showed complex plaque left ICA origin with 60% stenosis and evidence of right ICA fibromuscular dysplasia.  MRI of the brain showed normal brain for age and no acute abnormality.  Scan reviewed by neurology services felt that patient with small cerebellar infarct not seen on films likely embolic due to PAF.  She was started on Eliquis as patient agreeable.  Echo with ejection fraction of 55 to 60%.  KUB showed moderate marked and moderate left hydronephrosis with markedly distended bladder concerning for obstruction with a Foley tube recommended.  Also reported to have horizontal nystagmus with dizziness and movement due to vestibular dysfunction.  On 11/26 she developed A-fib with RVR rates of 170 treated with IV metoprolol.  Therapy evaluations completed due to patient decreased functional mobility was admitted for a comprehensive rehab program.     Hospital Course: [name] was admitted to rehab 12/20/2021 for inpatient therapies to consist of PT, ST and OT at least three hours five days a week. Past admission physiatrist, therapy team and rehab RN have worked together to provide customized collaborative inpatient rehab.  Pertaining to patient's right cerebellar CVA remained stable she remained on Eliquis therapy and would follow-up with neurology services.  Bilateral lower extremity neuropathy with the addition of gabapentin chronic back pain had injections in the past in [place].  X-rays lumbar showed severe DDD spondylosis.  Atrial fibrillation controlled remained on Tenormin as well as Eliquis.  Blood pressure soft and monitored will need outpatient follow-up.  AKI improved with latest creatinine 1.13.  Bilateral hydronephrosis on KUB initially on Ditropan that was discontinued.  Renal ultrasound follow-up showed no hydronephrosis.  Bouts of constipation resolved with laxative assistance.  Mood stabilization maintained on Zoloft  with emotional support provided    PAIN:  Are you having pain? No  PRECAUTIONS: Fall  WEIGHT BEARING RESTRICTIONS: No  FALLS: Has patient fallen in last 6 months? Yes. Number of falls 1; happened about 10 days before patient had the stroke; patient tripped over family member, she fell on concrete and hit her head  LIVING ENVIRONMENT: Lives with: lives alone Lives in: House/apartment Stairs: Yes: External: 3 steps; on right going  up Has following equipment at home: Single point cane, Walker - 2 wheeled, Wheelchair (manual), Shower bench, bed side commode, and Grab bars  PLOF: Independent  PATIENT GOALS: "Learning to walk by myself." (Referring to walking without walker)  OBJECTIVE:   Vitals:   02/02/22 1326  BP: (!) 150/73  Pulse: (!) 56      DIAGNOSTIC FINDINGS:   See above  TODAY'S TREATMENT:                Therex: - 1 x 10 bridges (shaky during elevation but able to achieve adequate clearance - educated patient on bringing legs closer to reduce lumbar pain) - 2 x 10 SLR (patient has -10 degrees knee extension bilaterally but educated to maximize quad contraction before leg elevation + added to HEP)  NMR: -Alt LE step taps to 4" step standing on large compliant floor mat 2 x 10 bil, 2 x 10 bil alt (CGA with modAx3 for balance) -Alt LE lateral step/returns to dots on large compliant floor mat 1 x 10 bil, 1 x 10 bil alt (CGA with modA x 2 for balance)  Gait: Modified TUG trials with emphasis on gait speed x 3 with 2WW (CGA) 1 x 115' with 2WW speed gait lap x 1 (CGA)   PATIENT EDUCATION: Education details: Continue HEP that was updated with SLR; footwear for safety with balance Person educated: Patient and Caregiver niece Education method: Explanation Education comprehension: verbalized understanding and needs further education  HOME EXERCISE PROGRAM: Recommended slowly increasing ambulation with supervision in setting with places for seated breaks as  needed  Access Code: [email or web address] URL: [email or web address] Date: 02/02/2022 Prepared by: Malachi Carl  Exercises - Corner Balance Feet Together With Eyes Open  - 1 x daily - 7 x weekly - 3 sets - 30 hold - Corner Balance Feet Together With Eyes Closed  - 1 x daily - 7 x weekly - 3 sets - 30 hold - Standing March with Counter Support  - 1 x daily - 7 x weekly - 3 sets - 10 reps - Sit to Stand with Armchair  - 1 x daily - 7 x weekly - 3 sets - 5 reps - Standing Tandem Balance with Counter Support  - 1 x daily - 7 x weekly - 3 sets - 30 hold - Supine Bridge  - 1 x daily - 7 x weekly - 2 sets - 10 reps - Supine Active Straight Leg Raise  - 1 x daily - 7 x weekly - 3 sets - 10 reps  GOALS: Goals reviewed with patient? Yes  SHORT TERM GOALS: Target date: 02/08/2022  Patient will demonstrate 100% compliance with initial HEP to continue to progress between physical therapy sessions.   Baseline: To be provided Goal status: INITIAL  2.  Patient will improve TUG score by 3" seconds with LRAD to indicate clinically significant progress towards a decreased risk of falls and improved mobility.  Baseline: 26.87" with FWW and 26.84" without walker unsteady Goal status: INITIAL  3.  Patient will improve ABC Score to 34% or greater to indicate a decreased risk for falls and improved self-reported confidence in balance and sense of steadiness.   Baseline: 20% Goal status: INITIAL  4.  Patient will improve gait speed to 0.8 m/s with LRAD to indicate improvement to the level of community ambulator in order to participate more easily in activities outside of the home.    Baseline: 0.48 m/s Goal status: INITIAL  LONG TERM GOALS: Target  date: 03/08/2022    Patient will report demonstrate independence with final HEP in order to maintain current gains and continue to progress after physical therapy discharge.   Baseline: To be provided Goal status: INITIAL  2.  Patient will improve TUG  score to 13.5" or less with LRAD to indicate a decreased risk of falls and demonstrate improved overall mobility.   Baseline: 26.87" with FWW and 26.84" without walker unsteady Goal status: INITIAL  3.  Patient will improve ABC Score to 67% or greater to indicate a decreased risk for falls and improved self-reported confidence in balance and sense of steadiness.   Baseline: 20% Goal status: INITIAL  4.  Patient will improve gait speed to 1.1 m/s or greater to indicate a reduced risk for falls.   Baseline: 0.48 m/s Goal status: INITIAL  5.  Patient will demonstrate ability to come to stand from chair without use of UE to demonstrate improve LE strength.  Baseline: requires bilateral use of UE Goal status: INITIAL  ASSESSMENT:  CLINICAL IMPRESSION: Emphasis of skilled session on gait speed, LE coordination, single leg stability and dynamic balance. Patient required modA several times throughout session to regain balance from therapist during NMR training due to coordination and balance deficits. Patient required x 6 seated breaks during the session due to LE fatigue. Patient wore shoes with less stability in today's session which also impacted stability. Educated patient / caregiver on footwear to improve stability. Patient demonstrated notably improved speed when cued from therapist during gait training. Plan to assess for time in next session. Patient will benefit from continued skilled physical therapy services to address impairments. Continue POC.  OBJECTIVE IMPAIRMENTS: Abnormal gait, decreased activity tolerance, decreased balance, decreased coordination, decreased mobility, difficulty walking, and decreased strength.   ACTIVITY LIMITATIONS: standing, squatting, stairs, and transfers  PARTICIPATION LIMITATIONS: driving, shopping, and community activity  PERSONAL FACTORS: Age, Past/current experiences, and Transportation are also affecting patient's functional outcome.   REHAB  POTENTIAL: Good  CLINICAL DECISION MAKING: Evolving/moderate complexity  EVALUATION COMPLEXITY: Moderate  PLAN:  PT FREQUENCY: 2x/week  PT DURATION: 8 weeks  PLANNED INTERVENTIONS: Therapeutic exercises, Therapeutic activity, Neuromuscular re-education, Balance training, Gait training, Patient/Family education, and Self Care  PLAN FOR NEXT SESSION: Dynamic balance training, LE strengthening, sit to stands from elevated surface, reactive stepping; SLS in preparation for showering/step overs etc, head turns, cognitive dual tasking; endurance tasks for standing/gait, rockerboard and airex tasks; gait speed laps in gym for time   Malachi Carl, PT, [facility] [address] [place], [place]  [address] Phone:  [phone] Fax:  [phone] 02/02/2022, 2:57 PM

## 2022-02-07 ENCOUNTER — Ambulatory Visit: Payer: Medicare (Managed Care) | Admitting: Physical Therapy

## 2022-02-07 ENCOUNTER — Encounter: Payer: Self-pay | Admitting: Physical Therapy

## 2022-02-07 VITALS — BP 135/58 | HR 66

## 2022-02-07 DIAGNOSIS — M6281 Muscle weakness (generalized): Secondary | ICD-10-CM

## 2022-02-07 DIAGNOSIS — R293 Abnormal posture: Secondary | ICD-10-CM

## 2022-02-07 DIAGNOSIS — R2681 Unsteadiness on feet: Secondary | ICD-10-CM

## 2022-02-07 DIAGNOSIS — R269 Unspecified abnormalities of gait and mobility: Secondary | ICD-10-CM

## 2022-02-07 DIAGNOSIS — R278 Other lack of coordination: Secondary | ICD-10-CM

## 2022-02-07 NOTE — Therapy (Addendum)
OUTPATIENT PHYSICAL THERAPY NEURO TREATMENT   Patient Name: [name] MRN: [id number] DOB:12-Apr-1934, 88 y.o., female Today's Date: 02/08/2022   PCP: Gaynelle Arabian REFERRING PROVIDER: Bary Leriche, PA-C  END OF SESSION:  PT End of Session - 02/07/22 1614     Visit Number 7    Number of Visits 17    Date for PT Re-Evaluation 03/08/22    Authorization Type [employer] Medicare Advantage    PT Start Time 1615    PT Stop Time 1658    PT Time Calculation (min) 43 min    Equipment Utilized During Treatment Gait belt    Activity Tolerance Patient tolerated treatment well    Behavior During Therapy WFL for tasks assessed/performed             Past Medical History:  Diagnosis Date   Arthritis    Atrial fibrillation (HCC)    Carotid artery occlusion    Colitis    COPD (chronic obstructive pulmonary disease) ([place])    Diverticulitis    Hypertension    Keratosis, seborrheic    Lumbar spondylosis    w/impingement L3-S1   Past Surgical History:  Procedure Laterality Date   ABDOMINAL HYSTERECTOMY     BREAST EXCISIONAL BIOPSY Left    BREAST EXCISIONAL BIOPSY Left    BREAST EXCISIONAL BIOPSY Right    BREAST SURGERY     cyst removal   CAROTID ENDARTERECTOMY Left 2010   CHOLECYSTECTOMY     EYE SURGERY Bilateral 06/2014   cataracts   JOINT REPLACEMENT     RIGHT KNEE   REPLACEMENT TOTAL KNEE Right    Patient Active Problem List   Diagnosis Date Noted   Cerebellar stroke ([place]) 12/20/2021   Acute ischemic stroke ([place]) 12/16/2021   Vertigo 12/14/2021   CVA (cerebral vascular accident) ([place]) 12/14/2021   Nausea and vomiting 12/14/2021   Hypokalemia 12/14/2021   Leukocytosis 12/14/2021   Essential hypertension 12/14/2021   Anxiety 12/14/2021   Hyperlipidemia 12/14/2021   Carotid artery disease ([place]) 12/14/2021   Osteoarthritis cervical spine 09/21/2020   Compression fracture of T12 vertebra ([place]) 04/16/2018   Paroxysmal atrial fibrillation ([place]) 03/03/2018   SBO (small  bowel obstruction) ([place]) 01/26/2017   Encounter for postoperative carotid endarterectomy surveillance 09/21/2015   Occlusion and stenosis of carotid artery without mention of cerebral infarction 02/05/2012    ONSET DATE: 01/01/2022 (referral date)  REFERRING DIAG:  I63.9 (ICD-10-CM) - Cerebral infarction, unspecified    THERAPY DIAG:  Unsteadiness on feet  Abnormality of gait and mobility  Other lack of coordination  Muscle weakness (generalized)  Abnormal posture  Rationale for Evaluation and Treatment: Rehabilitation  SUBJECTIVE:  SUBJECTIVE STATEMENT: PT orients to patient's left side throughout session as she hears better out of left ear. Patient also reports wanting to have her BP assessed each session if possible as she is logging it for her PCP.  Patient reports she is doing alright. Her back was hurting this morning, "I felt like I had been wrestling alligators!"  She reports her eyes are dry today and she has eye drops at home for this.  Pt accompanied by: family member - [name]  PERTINENT HISTORY:   Taken from 01/01/2022 Note:  Brief HPI:   [name] is a 88 y.o. right-handed female with history of hypertension, PAF, left CEA, COPD, lumbar spondylosis, SBO.  Admitted 12/14/2021 with reports of dizziness and nausea with emesis and inability to get out of bed.  She also reported a fall a week prior to admission and then struck her head without any significant injuries.  She was noted to have atrial fibrillation with elevated blood pressure and potassium 2.8.  CTA head and neck negative for LVO and showed complex plaque left ICA origin with 60% stenosis and evidence of right ICA fibromuscular dysplasia.  MRI of the brain showed normal brain for age and no acute abnormality.  Scan reviewed  by neurology services felt that patient with small cerebellar infarct not seen on films likely embolic due to PAF.  She was started on Eliquis as patient agreeable.  Echo with ejection fraction of 55 to 60%.  KUB showed moderate marked and moderate left hydronephrosis with markedly distended bladder concerning for obstruction with a Foley tube recommended.  Also reported to have horizontal nystagmus with dizziness and movement due to vestibular dysfunction.  On 11/26 she developed A-fib with RVR rates of 170 treated with IV metoprolol.  Therapy evaluations completed due to patient decreased functional mobility was admitted for a comprehensive rehab program.     Hospital Course: [name] was admitted to rehab 12/20/2021 for inpatient therapies to consist of PT, ST and OT at least three hours five days a week. Past admission physiatrist, therapy team and rehab RN have worked together to provide customized collaborative inpatient rehab.  Pertaining to patient's right cerebellar CVA remained stable she remained on Eliquis therapy and would follow-up with neurology services.  Bilateral lower extremity neuropathy with the addition of gabapentin chronic back pain had injections in the past in [place].  X-rays lumbar showed severe DDD spondylosis.  Atrial fibrillation controlled remained on Tenormin as well as Eliquis.  Blood pressure soft and monitored will need outpatient follow-up.  AKI improved with latest creatinine 1.13.  Bilateral hydronephrosis on KUB initially on Ditropan that was discontinued.  Renal ultrasound follow-up showed no hydronephrosis.  Bouts of constipation resolved with laxative assistance.  Mood stabilization maintained on Zoloft with emotional support provided    PAIN:  Are you having pain? No  PRECAUTIONS: Fall  WEIGHT BEARING RESTRICTIONS: No  FALLS: Has patient fallen in last 6 months? Yes. Number of falls 1; happened about 10 days before patient had the stroke; patient tripped  over family member, she fell on concrete and hit her head  LIVING ENVIRONMENT: Lives with: lives alone Lives in: House/apartment Stairs: Yes: External: 3 steps; on right going up Has following equipment at home: Single point cane, Walker - 2 wheeled, Wheelchair (manual), Shower bench, bed side commode, and Grab bars  PLOF: Independent  PATIENT GOALS: "Learning to walk by myself." (Referring to walking without walker)  OBJECTIVE:  LUE in sitting prior to  session: Vitals:   02/07/22 1623  BP: (!) 135/58  Pulse: 66    DIAGNOSTIC FINDINGS:   See above  TODAY'S TREATMENT:                NMR: -Alt LE 2" step overs CGA no UE support > alt 2" step over w/ bean bag toss to midline target 100% target accuracy alt UE, no major LOB, CGA and cuing for hip engagement on retreat > standing on doubled over blue mat pt performs repeated STS w/ bean bag toss to color coordinated dot using alt UE for added dual task (pt requires 3 seated rest to complete task) -6" step ups w/ head turns, regressed to right only in limited ROM as left was somewhat uncomfortable for pt  TA: -115' x2 w/ RW initial lap 56.65 seconds at normal speed, second lap 39.47 seconds cued for increased speed-no noted LOB, rest between laps as pt reports fatigue in hips  Pt requires seated rest between tasks.  PATIENT EDUCATION: Education details: Continue HEP. Person educated: Patient and Caregiver niece Education method: Explanation Education comprehension: verbalized understanding and needs further education  HOME EXERCISE PROGRAM: Recommended slowly increasing ambulation with supervision in setting with places for seated breaks as needed  Access Code: [email or web address] URL: [email or web address] Date: 02/02/2022 Prepared by: Maryruth Eve  Exercises - Corner Balance Feet Together With Eyes Open  - 1 x daily - 7 x weekly - 3 sets - 30 hold - Corner Balance Feet Together With Eyes Closed  - 1 x daily - 7 x  weekly - 3 sets - 30 hold - Standing March with Counter Support  - 1 x daily - 7 x weekly - 3 sets - 10 reps - Sit to Stand with Armchair  - 1 x daily - 7 x weekly - 3 sets - 5 reps - Standing Tandem Balance with Counter Support  - 1 x daily - 7 x weekly - 3 sets - 30 hold - Supine Bridge  - 1 x daily - 7 x weekly - 2 sets - 10 reps - Supine Active Straight Leg Raise  - 1 x daily - 7 x weekly - 3 sets - 10 reps  GOALS: Goals reviewed with patient? Yes  SHORT TERM GOALS: Target date: 02/08/2022  Patient will demonstrate 100% compliance with initial HEP to continue to progress between physical therapy sessions.   Baseline: To be provided Goal status: INITIAL  2.  Patient will improve TUG score by 3" seconds with LRAD to indicate clinically significant progress towards a decreased risk of falls and improved mobility.  Baseline: 26.87" with FWW and 26.84" without walker unsteady Goal status: INITIAL  3.  Patient will improve ABC Score to 34% or greater to indicate a decreased risk for falls and improved self-reported confidence in balance and sense of steadiness.   Baseline: 20% Goal status: INITIAL  4.  Patient will improve gait speed to 0.8 m/s with LRAD to indicate improvement to the level of community ambulator in order to participate more easily in activities outside of the home.    Baseline: 0.48 m/s Goal status: INITIAL  LONG TERM GOALS: Target date: 03/08/2022    Patient will report demonstrate independence with final HEP in order to maintain current gains and continue to progress after physical therapy discharge.   Baseline: To be provided Goal status: INITIAL  2.  Patient will improve TUG score to 13.5" or less with LRAD to indicate a decreased risk of falls and  demonstrate improved overall mobility.   Baseline: 26.87" with FWW and 26.84" without walker unsteady Goal status: INITIAL  3.  Patient will improve ABC Score to 67% or greater to indicate a decreased risk for falls  and improved self-reported confidence in balance and sense of steadiness.   Baseline: 20% Goal status: INITIAL  4.  Patient will improve gait speed to 1.1 m/s or greater to indicate a reduced risk for falls.   Baseline: 0.48 m/s Goal status: INITIAL  5.  Patient will demonstrate ability to come to stand from chair without use of UE to demonstrate improve LE strength.  Baseline: requires bilateral use of UE Goal status: INITIAL  ASSESSMENT:  CLINICAL IMPRESSION: Significant portion of session today spent on LE clearance without UE support and dynamic balance during limb advancement.  Pt tolerates challenge well with increased dual task components.  Pt would benefit from continued trials of exercise with reaction time component.  OBJECTIVE IMPAIRMENTS: Abnormal gait, decreased activity tolerance, decreased balance, decreased coordination, decreased mobility, difficulty walking, and decreased strength.   ACTIVITY LIMITATIONS: standing, squatting, stairs, and transfers  PARTICIPATION LIMITATIONS: driving, shopping, and community activity  PERSONAL FACTORS: Age, Past/current experiences, and Transportation are also affecting patient's functional outcome.   REHAB POTENTIAL: Good  CLINICAL DECISION MAKING: Evolving/moderate complexity  EVALUATION COMPLEXITY: Moderate  PLAN:  PT FREQUENCY: 2x/week  PT DURATION: 8 weeks  PLANNED INTERVENTIONS: Therapeutic exercises, Therapeutic activity, Neuromuscular re-education, Balance training, Gait training, Patient/Family education, and Self Care  PLAN FOR NEXT SESSION: Dynamic balance training, LE strengthening, sit to stands from elevated surface, reactive stepping; SLS in preparation for showering/step overs etc, head turns, cognitive dual tasking; endurance tasks for standing/gait, rockerboard and airex tasks; gait speed laps in gym for time-other reaction time tasks (blaze pods, etc), ASSESS STGs!   Elease Etienne, PT,  [facility] [address] [place], [place]  [address] Phone:  [phone] Fax:  [phone] 02/08/2022, 9:26 AM

## 2022-02-09 ENCOUNTER — Encounter: Payer: Self-pay | Admitting: Physical Therapy

## 2022-02-09 ENCOUNTER — Ambulatory Visit: Payer: Medicare (Managed Care) | Admitting: Physical Therapy

## 2022-02-09 VITALS — BP 124/53 | HR 63

## 2022-02-09 DIAGNOSIS — R2681 Unsteadiness on feet: Secondary | ICD-10-CM | POA: Diagnosis not present

## 2022-02-09 DIAGNOSIS — R278 Other lack of coordination: Secondary | ICD-10-CM

## 2022-02-09 DIAGNOSIS — R293 Abnormal posture: Secondary | ICD-10-CM

## 2022-02-09 DIAGNOSIS — M6281 Muscle weakness (generalized): Secondary | ICD-10-CM

## 2022-02-09 DIAGNOSIS — R269 Unspecified abnormalities of gait and mobility: Secondary | ICD-10-CM

## 2022-02-09 NOTE — Therapy (Signed)
OUTPATIENT PHYSICAL THERAPY NEURO TREATMENT / PROGRESS NOTE   Patient Name: GLENDA SPELMAN MRN: [id number] DOB:06/15/1934, 88 y.o., female Today's Date: 02/09/2022  PCP: Gaynelle Arabian REFERRING PROVIDER: Bary Leriche, PA-C  END OF SESSION:  PT End of Session - 02/09/22 1242     Visit Number 8    Number of Visits 17    Date for PT Re-Evaluation 03/08/22    Authorization Type [employer] Advantage    PT Start Time 1237    PT Stop Time 1316    PT Time Calculation (min) 39 min    Equipment Utilized During Treatment Gait belt    Activity Tolerance Patient tolerated treatment well    Behavior During Therapy WFL for tasks assessed/performed             Past Medical History:  Diagnosis Date   Arthritis    Atrial fibrillation ([place])    Carotid artery occlusion    Colitis    COPD (chronic obstructive pulmonary disease) (HCC)    Diverticulitis    Hypertension    Keratosis, seborrheic    Lumbar spondylosis    w/impingement L3-S1   Past Surgical History:  Procedure Laterality Date   ABDOMINAL HYSTERECTOMY     BREAST EXCISIONAL BIOPSY Left    BREAST EXCISIONAL BIOPSY Left    BREAST EXCISIONAL BIOPSY Right    BREAST SURGERY     cyst removal   CAROTID ENDARTERECTOMY Left 2010   CHOLECYSTECTOMY     EYE SURGERY Bilateral 06/2014   cataracts   JOINT REPLACEMENT     RIGHT KNEE   REPLACEMENT TOTAL KNEE Right    Patient Active Problem List   Diagnosis Date Noted   Cerebellar stroke ([place]) 12/20/2021   Acute ischemic stroke ([place]) 12/16/2021   Vertigo 12/14/2021   CVA (cerebral vascular accident) ([place]) 12/14/2021   Nausea and vomiting 12/14/2021   Hypokalemia 12/14/2021   Leukocytosis 12/14/2021   Essential hypertension 12/14/2021   Anxiety 12/14/2021   Hyperlipidemia 12/14/2021   Carotid artery disease ([place]) 12/14/2021   Osteoarthritis cervical spine 09/21/2020   Compression fracture of T12 vertebra ([place]) 04/16/2018   Paroxysmal atrial fibrillation ([place]) 03/03/2018    SBO (small bowel obstruction) ([place]) 01/26/2017   Encounter for postoperative carotid endarterectomy surveillance 09/21/2015   Occlusion and stenosis of carotid artery without mention of cerebral infarction 02/05/2012    ONSET DATE: 01/01/2022 (referral date)  REFERRING DIAG:  I63.9 (ICD-10-CM) - Cerebral infarction, unspecified    THERAPY DIAG:  Unsteadiness on feet  Abnormality of gait and mobility  Other lack of coordination  Muscle weakness (generalized)  Abnormal posture  Rationale for Evaluation and Treatment: Rehabilitation  SUBJECTIVE:  SUBJECTIVE STATEMENT: PT orients to patient's left side throughout session as she hears better out of left ear. Patient also reports wanting to have her BP assessed each session if possible as she is logging it for her PCP.  Patient reports she is doing alright. She reports feeling more off balance today. She denies falls since last visit. Patient has been regularly monitoring vitals and submitting them via MyChart to PCP.   Pt accompanied by: family member - [name]  PERTINENT HISTORY:   Taken from 01/01/2022 Note:  Brief HPI:   EVORA SCHECHTER is a 88 y.o. right-handed female with history of hypertension, PAF, left CEA, COPD, lumbar spondylosis, SBO.  Admitted 12/14/2021 with reports of dizziness and nausea with emesis and inability to get out of bed.  She also reported a fall a week prior to admission and then struck her head without any significant injuries.  She was noted to have atrial fibrillation with elevated blood pressure and potassium 2.8.  CTA head and neck negative for LVO and showed complex plaque left ICA origin with 60% stenosis and evidence of right ICA fibromuscular dysplasia.  MRI of the brain showed normal brain for age and no acute  abnormality.  Scan reviewed by neurology services felt that patient with small cerebellar infarct not seen on films likely embolic due to PAF.  She was started on Eliquis as patient agreeable.  Echo with ejection fraction of 55 to 60%.  KUB showed moderate marked and moderate left hydronephrosis with markedly distended bladder concerning for obstruction with a Foley tube recommended.  Also reported to have horizontal nystagmus with dizziness and movement due to vestibular dysfunction.  On 11/26 she developed A-fib with RVR rates of 170 treated with IV metoprolol.  Therapy evaluations completed due to patient decreased functional mobility was admitted for a comprehensive rehab program.     Hospital Course: KAMILIA CAROLLO was admitted to rehab 12/20/2021 for inpatient therapies to consist of PT, ST and OT at least three hours five days a week. Past admission physiatrist, therapy team and rehab RN have worked together to provide customized collaborative inpatient rehab.  Pertaining to patient's right cerebellar CVA remained stable she remained on Eliquis therapy and would follow-up with neurology services.  Bilateral lower extremity neuropathy with the addition of gabapentin chronic back pain had injections in the past in [place].  X-rays lumbar showed severe DDD spondylosis.  Atrial fibrillation controlled remained on Tenormin as well as Eliquis.  Blood pressure soft and monitored will need outpatient follow-up.  AKI improved with latest creatinine 1.13.  Bilateral hydronephrosis on KUB initially on Ditropan that was discontinued.  Renal ultrasound follow-up showed no hydronephrosis.  Bouts of constipation resolved with laxative assistance.  Mood stabilization maintained on Zoloft with emotional support provided    PAIN:  Are you having pain? No  PRECAUTIONS: Fall  WEIGHT BEARING RESTRICTIONS: No  FALLS: Has patient fallen in last 6 months? Yes. Number of falls 1; happened about 10 days before patient had  the stroke; patient tripped over family member, she fell on concrete and hit her head  LIVING ENVIRONMENT: Lives with: lives alone Lives in: House/apartment Stairs: Yes: External: 3 steps; on right going up Has following equipment at home: Single point cane, Walker - 2 wheeled, Wheelchair (manual), Shower bench, bed side commode, and Grab bars  PLOF: Independent  PATIENT GOALS: "Learning to walk by myself." (Referring to walking without walker)  OBJECTIVE:  LUE in sitting prior to session: Vitals:  02/09/22 1245  BP: (!) 124/53  Pulse: 63     DIAGNOSTIC FINDINGS:   See above  TODAY'S TREATMENT:                NRM: ABC: 76% : 15.4" or 0.65 m/s with FWW + SBA TUG: 20.2" with FWW + SBA; 21.46" w/o AD + CGA  Gait Training: - Trialed ambulation with LBQC 1 x 50' with CGA (difficult time managing bulk of cane/sequencing even with mod-max verbal cues) - Ambulation with SBQC 1 x 50' with CGA (improved sequencing, x1 instance of mild unsteadiness but patient was able to self correct)  Pt requires seated rest between tasks.  PATIENT EDUCATION: Education details: Continue HEP. Person educated: Patient and Caregiver niece Education method: Explanation Education comprehension: verbalized understanding and needs further education  HOME EXERCISE PROGRAM: Recommended slowly increasing ambulation with supervision in setting with places for seated breaks as needed  Access Code: [email or web address] URL: [email or web address] Date: 02/02/2022 Prepared by: Maryruth Eve  Exercises - Corner Balance Feet Together With Eyes Open  - 1 x daily - 7 x weekly - 3 sets - 30 hold - Corner Balance Feet Together With Eyes Closed  - 1 x daily - 7 x weekly - 3 sets - 30 hold - Standing March with Counter Support  - 1 x daily - 7 x weekly - 3 sets - 10 reps - Sit to Stand with Armchair  - 1 x daily - 7 x weekly - 3 sets - 5 reps - Standing Tandem Balance with Counter Support  - 1 x daily -  7 x weekly - 3 sets - 30 hold - Supine Bridge  - 1 x daily - 7 x weekly - 2 sets - 10 reps - Supine Active Straight Leg Raise  - 1 x daily - 7 x weekly - 3 sets - 10 reps  GOALS: Goals reviewed with patient? Yes  SHORT TERM GOALS: Target date: 02/08/2022  Patient will demonstrate 100% compliance with initial HEP to continue to progress between physical therapy sessions.   Baseline: To be provided; Provided; 80% compliance on 02/09/2022 Goal status: IN PROGRESS  2.  Patient will improve TUG score by 3" seconds with LRAD to indicate clinically significant progress towards a decreased risk of falls and improved mobility.  Baseline: 26.87" with FWW and 26.84" without walker unsteady; 20.2" with FWW + SBA; 21.46" w/o AD + CGA Goal status: MET  3.  Patient will improve ABC Score to 34% or greater to indicate a decreased risk for falls and improved self-reported confidence in balance and sense of steadiness.   Baseline: 20%; 02/09/2022 to 76% Goal status: MET  4.  Patient will improve gait speed to 0.8 m/s with LRAD to indicate improvement to the level of community ambulator in order to participate more easily in activities outside of the home.    Baseline: 0.48 m/s; improved to 0.65 m/s on 02/09/2022 Goal status: IN PROGRESS  LONG TERM GOALS: Target date: 03/08/2022    Patient will report demonstrate independence with final HEP in order to maintain current gains and continue to progress after physical therapy discharge.   Baseline: To be provided Goal status: INITIAL  2.  Patient will improve TUG score to 13.5" or less with LRAD to indicate a decreased risk of falls and demonstrate improved overall mobility.   Baseline: 26.87" with FWW and 26.84" without walker unsteady; 20.2" with FWW + SBA; 21.46" w/o AD + CGA on 02/09/2022 Goal status: IN PROGRESS  3.  Patient will improve ABC Score to 67% or greater to indicate a decreased risk for falls and improved self-reported confidence in balance  and sense of steadiness.   Baseline: 20%; 02/09/2022 to 76% Goal status: MET  4.  Patient will improve gait speed to 1.1 m/s or greater to indicate a reduced risk for falls.   Baseline: 0.48 m/s; improved to 9.65 m/s on 02/09/2022 Goal status: IN PROGRESS  5.  Patient will demonstrate ability to come to stand from chair without use of UE to demonstrate improve LE strength.  Baseline: requires bilateral use of UE; sit to stand without UE support x 1 on 02/09/2022 Goal status: MET  ASSESSMENT:  CLINICAL IMPRESSION: Session emphasized review of STG. Patient demonstrating significant progress on goals, meeting 2/4 short term goals and progressing well towards remaining. Patient demonstrates reduced risk for falls as indicated by ABC score > 67%. Patient demonstrated improvements in gait speed to 0.65 m/s as well as TUG improvement by ~6 seconds. While patient still at an increased risk for falls by current values in these areas, she is progressing well. Trialed use of cane at end of session. Patient not safe at this time for cane use outside of clinic but will continue to trial in future sessions as consider again as balance improves. Patient will benefit from skilled physical therapy services to address remaining deficits.   OBJECTIVE IMPAIRMENTS: Abnormal gait, decreased activity tolerance, decreased balance, decreased coordination, decreased mobility, difficulty walking, and decreased strength.   ACTIVITY LIMITATIONS: standing, squatting, stairs, and transfers  PARTICIPATION LIMITATIONS: driving, shopping, and community activity  PERSONAL FACTORS: Age, Past/current experiences, and Transportation are also affecting patient's functional outcome.   REHAB POTENTIAL: Good  CLINICAL DECISION MAKING: Evolving/moderate complexity  EVALUATION COMPLEXITY: Moderate  PLAN:  PT FREQUENCY: 2x/week  PT DURATION: 8 weeks  PLANNED INTERVENTIONS: Therapeutic exercises, Therapeutic activity,  Neuromuscular re-education, Balance training, Gait training, Patient/Family education, and Self Care  PLAN FOR NEXT SESSION: Dynamic balance training, LE strengthening, reactive stepping; SLS in preparation for showering/step overs etc, head turns, cognitive dual tasking; endurance tasks for standing/gait, rockerboard and airex tasks; gait speed laps in gym for time-other reaction time tasks (blaze pods, etc), SBQC use in session with functional tasks  Malachi Carl, PT, DPT 02/09/2022, 3:08 PM

## 2022-02-13 ENCOUNTER — Encounter
Payer: Medicare (Managed Care) | Attending: Physical Medicine & Rehabilitation | Admitting: Physical Medicine & Rehabilitation

## 2022-02-13 ENCOUNTER — Encounter: Payer: Self-pay | Admitting: Physical Medicine & Rehabilitation

## 2022-02-13 VITALS — BP 126/77 | HR 64 | Ht 64.5 in | Wt 145.8 lb

## 2022-02-13 DIAGNOSIS — I639 Cerebral infarction, unspecified: Secondary | ICD-10-CM | POA: Diagnosis not present

## 2022-02-13 NOTE — Progress Notes (Signed)
Subjective:    Patient ID: [name], female    DOB: 1934/08/26, 88 y.o.   MRN: [id number] Gait Training:   Sit to stand and transfers with rolling walker modified independent. Amatory transfers to the bathroom with supervision. Ambulates 150 feet x 3 rolling walker and supervision. Up-and-down stairs with handrails at supervision level. Patient completes full shower ADL at overall supervision level. Requires minimal verbal cues for safety. Lower body dressing included completing while seated versus standing and supervision. Full family teaching completed plan discharge to home  88 y.o. right-handed female with history of hypertension, PAF, left CEA, COPD, lumbar spondylosis, SBO.  Admitted 12/14/2021 with reports of dizziness and nausea with emesis and inability to get out of bed.  She also reported a fall a week prior to admission and then struck her head without any significant injuries.  She was noted to have atrial fibrillation with elevated blood pressure and potassium 2.8.  CTA head and neck negative for LVO and showed complex plaque left ICA origin with 60% stenosis and evidence of right ICA fibromuscular dysplasia.  MRI of the brain showed normal brain for age and no acute abnormality.  Scan reviewed by neurology services felt that patient with small cerebellar infarct not seen on films likely embolic due to PAF.  She was started on Eliquis as patient agreeable.  Echo with ejection fraction of 55 to 60%.  KUB showed moderate marked and moderate left hydronephrosis with markedly distended bladder concerning for obstruction with a Foley tube recommended.  Also reported to have horizontal nystagmus with dizziness and movement due to vestibular dysfunction.  On 11/26 she developed A-fib with RVR rates of 170 treated with IV metoprolol.    Admit date: 12/20/2021 Discharge date: 01/01/2022 HPI  Patient here for rehab follow-up after probable cerebellar infarct likely embolic with inpatient rehab  at [facility].  Overall doing quite well.  No complaints today.  She was transported here by her niece  Living at home, making simple meals , mod I ADLs, still using walker to ambulated , was too wobbly with QC  Seen by PCP who is going to retire this fall  Had audiology appt last month and another appt next month  Pain Inventory Average Pain 0 Pain Right Now 0 My pain is  no pain  LOCATION OF PAIN  no pain  BOWEL Number of stools per week: 7 Oral laxative use No  Type of laxative . Enema or suppository use No  History of colostomy No  Incontinent No   BLADDER Pads In and out cath, frequency . Able to self cath  . Bladder incontinence Yes  Frequent urination No  Leakage with coughing No  Difficulty starting stream No  Incomplete bladder emptying No    Mobility use a walker ability to climb steps?  yes do you drive?  no  Function not employed: date last employed 2005  Neuro/Psych weakness tremor trouble walking  Prior Studies Hospital f/u  Physicians involved in your care Hospital fu   Family History  Problem Relation Age of Onset   Heart disease Mother        After age 21   AAA (abdominal aortic aneurysm) Sister    AAA (abdominal aortic aneurysm) Brother    Heart attack Brother    Breast cancer Cousin    Breast cancer Other    Breast cancer Other    Social History   Socioeconomic History   Marital status: Widowed    Spouse  name: Not on file   Number of children: Not on file   Years of education: Not on file   Highest education level: Not on file  Occupational History   Not on file  Tobacco Use   Smoking status: Former    Packs/day: 1.50    Years: 50.00    Total pack years: 75.00    Types: Cigarettes    Quit date: 02/04/2002    Years since quitting: 20.0   Smokeless tobacco: Never  Vaping Use   Vaping Use: Never used  Substance and Sexual Activity   Alcohol use: No   Drug use: No   Sexual activity: Not on file  Other Topics  Concern   Not on file  Social History Narrative   Not on file   Social Determinants of Health   Financial Resource Strain: Not on file  Food Insecurity: Not on file  Transportation Needs: Not on file  Physical Activity: Not on file  Stress: Not on file  Social Connections: Not on file   Past Surgical History:  Procedure Laterality Date   ABDOMINAL HYSTERECTOMY     BREAST EXCISIONAL BIOPSY Left    BREAST EXCISIONAL BIOPSY Left    BREAST EXCISIONAL BIOPSY Right    BREAST SURGERY     cyst removal   CAROTID ENDARTERECTOMY Left 2010   CHOLECYSTECTOMY     EYE SURGERY Bilateral 06/2014   cataracts   JOINT REPLACEMENT     RIGHT KNEE   REPLACEMENT TOTAL KNEE Right    Past Medical History:  Diagnosis Date   Arthritis    Atrial fibrillation ([place])    Carotid artery occlusion    Colitis    COPD (chronic obstructive pulmonary disease) (HCC)    Diverticulitis    Hypertension    Keratosis, seborrheic    Lumbar spondylosis    w/impingement L3-S1   BP 126/77   Pulse 64   Ht 5' 4.5" (1.638 m)   Wt 145 lb 12.8 oz (66.1 kg)   SpO2 95%   BMI 24.64 kg/m   Opioid Risk Score:   Fall Risk Score:  `1  Depression screen [facility] 2/9     02/13/2022    1:15 PM  Depression screen PHQ 2/9  Decreased Interest 0  Down, Depressed, Hopeless 0  PHQ - 2 Score 0  Altered sleeping 0  Tired, decreased energy 1  Change in appetite 0  Feeling bad or failure about yourself  0  Trouble concentrating 0  Moving slowly or fidgety/restless 0  Suicidal thoughts 0  PHQ-9 Score 1  Difficult doing work/chores Not difficult at all     Review of Systems  Respiratory:  Positive for cough.   Musculoskeletal:  Positive for gait problem.  Neurological:  Positive for tremors and weakness.  All other systems reviewed and are negative.     Objective:   Physical Exam  General no acute distress Mood and affect are appropriate Extremities without edema Motor strength is 5/5 bilateral deltoid bicep  tricep grip hip flexor knee extensor ankle dorsiflexor No dysdiadochokinesis with rapid supination pronation of the upper extremities Bilateral finger-nose-finger and heel-to-shin testing is normal. Sit to stand modified independent stands independently wide-based support able to close eyes without loss of balance.  Walks with hand-held assistance or modified independent with a walker.       Assessment & Plan:  #1.  Mild truncal ataxia related to probable small cerebellar infarct.  Making some improvements with balance and gait however still requiring  a walker at home.  Goals are to get back to a cane.  From a ADL standpoint is modified independent. She can follow-up with primary care as well as neurology Complete outpatient therapy next month Physical medicine rehab follow-up on as-needed basis.

## 2022-02-14 ENCOUNTER — Ambulatory Visit: Payer: Medicare (Managed Care) | Admitting: Physical Therapy

## 2022-02-14 ENCOUNTER — Ambulatory Visit: Payer: Medicare (Managed Care) | Admitting: Diagnostic Neuroimaging

## 2022-02-14 ENCOUNTER — Encounter: Payer: Self-pay | Admitting: Physical Therapy

## 2022-02-14 ENCOUNTER — Encounter: Payer: Self-pay | Admitting: Diagnostic Neuroimaging

## 2022-02-14 VITALS — BP 134/64 | HR 54 | Ht 63.0 in | Wt 145.0 lb

## 2022-02-14 DIAGNOSIS — R2681 Unsteadiness on feet: Secondary | ICD-10-CM | POA: Diagnosis not present

## 2022-02-14 DIAGNOSIS — R293 Abnormal posture: Secondary | ICD-10-CM

## 2022-02-14 DIAGNOSIS — R269 Unspecified abnormalities of gait and mobility: Secondary | ICD-10-CM

## 2022-02-14 DIAGNOSIS — I639 Cerebral infarction, unspecified: Secondary | ICD-10-CM

## 2022-02-14 DIAGNOSIS — R42 Dizziness and giddiness: Secondary | ICD-10-CM

## 2022-02-14 DIAGNOSIS — M6281 Muscle weakness (generalized): Secondary | ICD-10-CM

## 2022-02-14 DIAGNOSIS — R278 Other lack of coordination: Secondary | ICD-10-CM

## 2022-02-14 NOTE — Therapy (Unsigned)
OUTPATIENT PHYSICAL THERAPY NEURO TREATMENT    Patient Name: [name] MRN: [id number] DOB:[birth year], 88 y.o., female Today's Date: 02/15/2022  PCP: Gaynelle Arabian REFERRING PROVIDER: Bary Leriche, PA-C  END OF SESSION:  PT End of Session - 02/14/22 1022     Visit Number 9    Number of Visits 17    Date for PT Re-Evaluation 03/08/22    Authorization Type [employer]vantage    PT Start Time 1019   pt late   PT Stop Time 1103    PT Time Calculation (min) 44 min    Equipment Utilized During Treatment Gait belt    Activity Tolerance Patient tolerated treatment well    Behavior During Therapy WFL for tasks assessed/performed             PT End of Session - 02/14/22 1022     Visit Number 9    Number of Visits 17    Date for PT Re-Evaluation 03/08/22    Authorization Type [employer] Advantage    PT Start Time 1019   pt late   PT Stop Time 1103    PT Time Calculation (min) 44 min    Equipment Utilized During Treatment Gait belt    Activity Tolerance Patient tolerated treatment well    Behavior During Therapy WFL for tasks assessed/performed               Past Medical History:  Diagnosis Date   Arthritis    Atrial fibrillation ([place])    Carotid artery occlusion    Colitis    COPD (chronic obstructive pulmonary disease) ([place])    Diverticulitis    Hypertension    Keratosis, seborrheic    Lumbar spondylosis    w/impingement L3-S1   Past Surgical History:  Procedure Laterality Date   ABDOMINAL HYSTERECTOMY     BREAST EXCISIONAL BIOPSY Left    BREAST EXCISIONAL BIOPSY Left    BREAST EXCISIONAL BIOPSY Right    BREAST SURGERY     cyst removal   CAROTID ENDARTERECTOMY Left 2010   CHOLECYSTECTOMY     EYE SURGERY Bilateral 06/2014   cataracts   JOINT REPLACEMENT     RIGHT KNEE   REPLACEMENT TOTAL KNEE Right    Patient Active Problem List   Diagnosis Date Noted   Cerebellar stroke ([place]) 12/20/2021   Acute ischemic stroke ([place]) 12/16/2021    Vertigo 12/14/2021   CVA (cerebral vascular accident) ([place]) 12/14/2021   Nausea and vomiting 12/14/2021   Hypokalemia 12/14/2021   Leukocytosis 12/14/2021   Essential hypertension 12/14/2021   Anxiety 12/14/2021   Hyperlipidemia 12/14/2021   Carotid artery disease ([place]) 12/14/2021   Osteoarthritis cervical spine 09/21/2020   Compression fracture of T12 vertebra ([place]) 04/16/2018   Paroxysmal atrial fibrillation ([place]) 03/03/2018   SBO (small bowel obstruction) ([place]) 01/26/2017   Encounter for postoperative carotid endarterectomy surveillance 09/21/2015   Occlusion and stenosis of carotid artery without mention of cerebral infarction 02/05/2012    ONSET DATE: 01/01/2022 (referral date)  REFERRING DIAG:  I63.9 (ICD-10-CM) - Cerebral infarction, unspecified    THERAPY DIAG:  Unsteadiness on feet  Abnormality of gait and mobility  Other lack of coordination  Muscle weakness (generalized)  Abnormal posture  Rationale for Evaluation and Treatment: Rehabilitation  SUBJECTIVE:  SUBJECTIVE STATEMENT: PT orients to patient's left side throughout session as she hears better out of left ear.   Patient reports she is doing alright. She reports feeling more off balance this morning. She denies falls since last visit. She is done with her vitals report for PCP, but plans to keep a regular check but at a lower frequency than previously.  Pt reports she had a sharp right ear pain over the weekend that went away Monday and returned just briefly yesterday.    Pt accompanied by: family member - Tresa Endo (great niece)  PERTINENT HISTORY:   Taken from 01/01/2022 Note:  Brief HPI:   BROOKLEY SPITLER is a 88 y.o. right-handed female with history of hypertension, PAF, left CEA, COPD, lumbar spondylosis, SBO.  Admitted  12/14/2021 with reports of dizziness and nausea with emesis and inability to get out of bed.  She also reported a fall a week prior to admission and then struck her head without any significant injuries.  She was noted to have atrial fibrillation with elevated blood pressure and potassium 2.8.  CTA head and neck negative for LVO and showed complex plaque left ICA origin with 60% stenosis and evidence of right ICA fibromuscular dysplasia.  MRI of the brain showed normal brain for age and no acute abnormality.  Scan reviewed by neurology services felt that patient with small cerebellar infarct not seen on films likely embolic due to PAF.  She was started on Eliquis as patient agreeable.  Echo with ejection fraction of 55 to 60%.  KUB showed moderate marked and moderate left hydronephrosis with markedly distended bladder concerning for obstruction with a Foley tube recommended.  Also reported to have horizontal nystagmus with dizziness and movement due to vestibular dysfunction.  On 11/26 she developed A-fib with RVR rates of 170 treated with IV metoprolol.  Therapy evaluations completed due to patient decreased functional mobility was admitted for a comprehensive rehab program.     Hospital Course: ARVIS MIGUEZ was admitted to rehab 12/20/2021 for inpatient therapies to consist of PT, ST and OT at least three hours five days a week. Past admission physiatrist, therapy team and rehab RN have worked together to provide customized collaborative inpatient rehab.  Pertaining to patient's right cerebellar CVA remained stable she remained on Eliquis therapy and would follow-up with neurology services.  Bilateral lower extremity neuropathy with the addition of gabapentin chronic back pain had injections in the past in [place].  X-rays lumbar showed severe DDD spondylosis.  Atrial fibrillation controlled remained on Tenormin as well as Eliquis.  Blood pressure soft and monitored will need outpatient follow-up.  AKI improved  with latest creatinine 1.13.  Bilateral hydronephrosis on KUB initially on Ditropan that was discontinued.  Renal ultrasound follow-up showed no hydronephrosis.  Bouts of constipation resolved with laxative assistance.  Mood stabilization maintained on Zoloft with emotional support provided    PAIN:  Are you having pain? No  PRECAUTIONS: Fall  WEIGHT BEARING RESTRICTIONS: No  FALLS: Has patient fallen in last 6 months? Yes. Number of falls 1; happened about 10 days before patient had the stroke; patient tripped over family member, she fell on concrete and hit her head  LIVING ENVIRONMENT: Lives with: lives alone Lives in: House/apartment Stairs: Yes: External: 3 steps; on right going up Has following equipment at home: Single point cane, Walker - 2 wheeled, Wheelchair (manual), Shower bench, bed side commode, and Grab bars  PLOF: Independent  PATIENT GOALS: "Learning to walk by myself." (Referring  to walking without walker)  OBJECTIVE:  LUE in sitting prior to session (pt requests to wait due to neurologist appt right after): There were no vitals filed for this visit.    DIAGNOSTIC FINDINGS:   See above  TODAY'S TREATMENT:                NRM: Firm perturbations > airex perturbations, pt has repeated posterior LOB requiring minA to recover w/ anterior to posterior oriented perturbations Bean bag toss w/ alt forward step out and quarter rotation w/ progressed distance of targets, CGA, cued to improve BLE clearance on step back to starting position Forward and backward ambulation on blue mat surface alt lateral blaze pod taps using 6 pods x56min intervals w/ 30 sec rest between sets x3 sets (7 taps > 10 taps > 10 taps) Lateral stepping blaze pod taps on firm surface 3 rounds x24min w/ 30 sec standing rest between sets, single lateral LOB w/ pt having good stepping strategy, CGA (21 taps each round)  Pt requires seated rest between tasks.  PATIENT EDUCATION: Education details:  Continue HEP.  Reach out to audiologist about pain in ear. Person educated: Patient and Caregiver niece Education method: Explanation Education comprehension: verbalized understanding and needs further education  HOME EXERCISE PROGRAM: Recommended slowly increasing ambulation with supervision in setting with places for seated breaks as needed  Access Code: [email or web address] URL: [email or web address] Date: 02/02/2022 Prepared by: Malachi Carl  Exercises - Corner Balance Feet Together With Eyes Open  - 1 x daily - 7 x weekly - 3 sets - 30 hold - Corner Balance Feet Together With Eyes Closed  - 1 x daily - 7 x weekly - 3 sets - 30 hold - Standing March with Counter Support  - 1 x daily - 7 x weekly - 3 sets - 10 reps - Sit to Stand with Armchair  - 1 x daily - 7 x weekly - 3 sets - 5 reps - Standing Tandem Balance with Counter Support  - 1 x daily - 7 x weekly - 3 sets - 30 hold - Supine Bridge  - 1 x daily - 7 x weekly - 2 sets - 10 reps - Supine Active Straight Leg Raise  - 1 x daily - 7 x weekly - 3 sets - 10 reps  GOALS: Goals reviewed with patient? Yes  SHORT TERM GOALS: Target date: 02/08/2022  Patient will demonstrate 100% compliance with initial HEP to continue to progress between physical therapy sessions.   Baseline: To be provided; Provided; 80% compliance on 02/09/2022 Goal status: IN PROGRESS  2.  Patient will improve TUG score by 3" seconds with LRAD to indicate clinically significant progress towards a decreased risk of falls and improved mobility.  Baseline: 26.87" with FWW and 26.84" without walker unsteady; 20.2" with FWW + SBA; 21.46" w/o AD + CGA Goal status: MET  3.  Patient will improve ABC Score to 34% or greater to indicate a decreased risk for falls and improved self-reported confidence in balance and sense of steadiness.   Baseline: 20%; 02/09/2022 to 76% Goal status: MET  4.  Patient will improve gait speed to 0.8 m/s with LRAD to indicate improvement  to the level of community ambulator in order to participate more easily in activities outside of the home.    Baseline: 0.48 m/s; improved to 0.65 m/s on 02/09/2022 Goal status: IN PROGRESS  LONG TERM GOALS: Target date: 03/08/2022    Patient will report demonstrate independence with final HEP  in order to maintain current gains and continue to progress after physical therapy discharge.   Baseline: To be provided Goal status: INITIAL  2.  Patient will improve TUG score to 13.5" or less with LRAD to indicate a decreased risk of falls and demonstrate improved overall mobility.   Baseline: 26.87" with FWW and 26.84" without walker unsteady; 20.2" with FWW + SBA; 21.46" w/o AD + CGA on 02/09/2022 Goal status: IN PROGRESS  3.  Patient will improve ABC Score to 67% or greater to indicate a decreased risk for falls and improved self-reported confidence in balance and sense of steadiness.   Baseline: 20%; 02/09/2022 to 76% Goal status: MET  4.  Patient will improve gait speed to 1.1 m/s or greater to indicate a reduced risk for falls.   Baseline: 0.48 m/s; improved to 9.65 m/s on 02/09/2022 Goal status: IN PROGRESS  5.  Patient will demonstrate ability to come to stand from chair without use of UE to demonstrate improve LE strength.  Baseline: requires bilateral use of UE; sit to stand without UE support x 1 on 02/09/2022 Goal status: MET  ASSESSMENT:  CLINICAL IMPRESSION: Focus of skilled session on continuing to address dynamic balance and various balance strategies.  She tolerates unsupported standing and dynamic tasks with CGA and no major LOB.  She continues to benefit from skilled PT to address functional strength and balance to promote improved safety of mobility.  OBJECTIVE IMPAIRMENTS: Abnormal gait, decreased activity tolerance, decreased balance, decreased coordination, decreased mobility, difficulty walking, and decreased strength.   ACTIVITY LIMITATIONS: standing, squatting, stairs,  and transfers  PARTICIPATION LIMITATIONS: driving, shopping, and community activity  PERSONAL FACTORS: Age, Past/current experiences, and Transportation are also affecting patient's functional outcome.   REHAB POTENTIAL: Good  CLINICAL DECISION MAKING: Evolving/moderate complexity  EVALUATION COMPLEXITY: Moderate  PLAN:  PT FREQUENCY: 2x/week  PT DURATION: 8 weeks  PLANNED INTERVENTIONS: Therapeutic exercises, Therapeutic activity, Neuromuscular re-education, Balance training, Gait training, Patient/Family education, and Self Care  PLAN FOR NEXT SESSION: Dynamic balance training, LE strengthening, reactive stepping; SLS in preparation for showering/step overs etc, head turns, cognitive dual tasking; endurance tasks for standing/gait, rockerboard and airex tasks; gait speed laps in gym for time-other reaction time tasks maybe w/ UE/LE (blaze pods, etc), SBQC use in session with functional tasks  Elease Etienne, PT, DPT 02/15/2022, 8:58 AM

## 2022-02-14 NOTE — Progress Notes (Unsigned)
[facility]  PATIENT: [name] DOB: 1934/11/23  REFERRING CLINICIAN: Bary Leriche, PA-C HISTORY FROM: patient  REASON FOR VISIT: new consult   HISTORICAL  CHIEF COMPLAINT:  Chief Complaint  Patient presents with   Follow-up    Rm 6 with great niece Claiborne Billings  Pt is well and stable, no residual concerns     HISTORY OF PRESENT ILLNESS:   88 year old female with hypertension, hyperlipidemia, left carotid endarterectomy, atrial fibrillation, presented with sudden onset dizziness and nausea in November 2023.  She was admitted for evaluation.  MRI brain was obtained which was reported as negative although there is a subtle right cerebellar DWI hyperintensity which may be acute infarct versus artifact.  She was medically managed.  Since that time slurred speech, dizziness and balance are gradually improving.   REVIEW OF SYSTEMS: Full 14 system review of systems performed and negative with exception of: as per HPI.  ALLERGIES: Allergies  Allergen Reactions   Amoxicillin Nausea Only and Rash    Has patient had a PCN reaction causing immediate rash, facial/tongue/throat swelling, SOB or lightheadedness with hypotension: Yes Has patient had a PCN reaction causing severe rash involving mucus membranes or skin necrosis: No Has patient had a PCN reaction that required hospitalization No Has patient had a PCN reaction occurring within the last 10 years: No If all of the above answers are "NO", then may proceed with Cephalosporin use.    Tetracyclines & Related Nausea Only and Rash    HOME MEDICATIONS: Outpatient Medications Prior to Visit  Medication Sig Dispense Refill   acetaminophen (TYLENOL) 325 MG tablet Take 1-2 tablets (325-650 mg total) by mouth every 4 (four) hours as needed for mild pain.     amLODipine (NORVASC) 10 MG tablet Take by mouth.     apixaban (ELIQUIS) 5 MG TABS tablet Take 1 tablet (5 mg total) by mouth 2 (two) times daily. 60 tablet 0    atenolol (TENORMIN) 25 MG tablet Take 0.5 tablets (12.5 mg total) by mouth daily with breakfast. 30 tablet 0   diclofenac Sodium (VOLTAREN) 1 % GEL Apply 2 g topically 4 (four) times daily. 150 g 1   gabapentin (NEURONTIN) 100 MG capsule Take 2 capsules (200 mg total) by mouth at bedtime. 30 capsule 0   loratadine (CLARITIN) 10 MG tablet Take 10 mg by mouth daily.     LORazepam (ATIVAN) 1 MG tablet Take 0.5 tablets (0.5 mg total) by mouth at bedtime. Note medication was decreased and you should have additional pills at home. (Patient taking differently: Take 0.5 mg by mouth at bedtime. Pt taking 1 tab in morning, .5 tab night) 30 tablet 0   losartan (COZAAR) 100 MG tablet Take 1 tablet (100 mg total) by mouth daily with breakfast. 30 tablet 0   Multiple Vitamin (MULTIVITAMIN PO) Take 1 tablet by mouth daily.     omeprazole (PRILOSEC) 40 MG capsule Take 1 capsule (40 mg total) by mouth daily. 30 capsule 0   rosuvastatin (CRESTOR) 20 MG tablet Take 1 tablet (20 mg total) by mouth daily. 30 tablet 0   sertraline (ZOLOFT) 100 MG tablet Take 1 tablet (100 mg total) by mouth at bedtime. 30 tablet 0   sodium chloride (OCEAN) 0.65 % SOLN nasal spray Place 1 spray into both nostrils 2 (two) times daily.     amLODipine (NORVASC) 5 MG tablet Take 1 tablet (5 mg total) by mouth daily. (Patient not taking: Reported on 02/14/2022) 30 tablet 0  lidocaine (LIDODERM) 5 % Place 2 patches onto the skin at bedtime. Apply at 7 pm and remove at 7 am daily. 60 patch 0   polyethylene glycol powder (GLYCOLAX/MIRALAX) 17 GM/SCOOP powder Take 1 capful (17 g) by mouth daily. 238 g 0   No facility-administered medications prior to visit.    PAST MEDICAL HISTORY: Past Medical History:  Diagnosis Date   Arthritis    Atrial fibrillation ([facility])    Carotid artery occlusion    Colitis    COPD (chronic obstructive pulmonary disease) ([facility])    Diverticulitis    Hypertension    Keratosis, seborrheic    Lumbar spondylosis     w/impingement L3-S1    PAST SURGICAL HISTORY: Past Surgical History:  Procedure Laterality Date   ABDOMINAL HYSTERECTOMY     BREAST EXCISIONAL BIOPSY Left    BREAST EXCISIONAL BIOPSY Left    BREAST EXCISIONAL BIOPSY Right    BREAST SURGERY     cyst removal   CAROTID ENDARTERECTOMY Left 2010   CHOLECYSTECTOMY     EYE SURGERY Bilateral 06/2014   cataracts   JOINT REPLACEMENT     RIGHT KNEE   REPLACEMENT TOTAL KNEE Right     FAMILY HISTORY: Family History  Problem Relation Age of Onset   Heart disease Mother        After age 19   AAA (abdominal aortic aneurysm) Sister    AAA (abdominal aortic aneurysm) Brother    Heart attack Brother    Breast cancer Cousin    Breast cancer Other    Breast cancer Other     SOCIAL HISTORY: Social History   Socioeconomic History   Marital status: Widowed    Spouse name: Not on file   Number of children: Not on file   Years of education: Not on file   Highest education level: Not on file  Occupational History   Not on file  Tobacco Use   Smoking status: Former    Packs/day: 1.50    Years: 50.00    Total pack years: 75.00    Types: Cigarettes    Quit date: 02/04/2002    Years since quitting: 20.0   Smokeless tobacco: Never  Vaping Use   Vaping Use: Never used  Substance and Sexual Activity   Alcohol use: No   Drug use: No   Sexual activity: Not on file  Other Topics Concern   Not on file  Social History Narrative   Not on file   Social Determinants of Health   Financial Resource Strain: Not on file  Food Insecurity: Not on file  Transportation Needs: Not on file  Physical Activity: Not on file  Stress: Not on file  Social Connections: Not on file  Intimate Partner Violence: Not on file     PHYSICAL EXAM  GENERAL EXAM/CONSTITUTIONAL: Vitals:  Vitals:   02/14/22 1144  BP: 134/64  Pulse: (!) 54  Weight: 145 lb (65.8 kg)  Height: 5\' 3"  (1.6 m)   Body mass index is 25.69 kg/m. Wt Readings from Last 3  Encounters:  02/14/22 145 lb (65.8 kg)  02/13/22 145 lb 12.8 oz (66.1 kg)  12/25/21 143 lb 4.8 oz (65 kg)   Patient is in no distress; well developed, nourished and groomed; neck is supple  CARDIOVASCULAR: Examination of carotid arteries is normal; no carotid bruits Regular rate and rhythm, no murmurs Examination of peripheral vascular system by observation and palpation is normal  EYES: Ophthalmoscopic exam of optic discs and posterior segments  is normal; no papilledema or hemorrhages No results found.  MUSCULOSKELETAL: Gait, strength, tone, movements noted in Neurologic exam below  NEUROLOGIC: MENTAL STATUS:      No data to display         awake, alert, oriented to person, place and time recent and remote memory intact normal attention and concentration language fluent, comprehension intact, naming intact fund of knowledge appropriate  CRANIAL NERVE:  2nd - no papilledema on fundoscopic exam 2nd, 3rd, 4th, 6th - pupils equal and reactive to light, visual fields full to confrontation, extraocular muscles intact, no nystagmus 5th - facial sensation symmetric 7th - facial strength symmetric 8th - hearing intact 9th - palate elevates symmetrically, uvula midline 11th - shoulder shrug symmetric 12th - tongue protrusion midline  MOTOR:  normal bulk and tone, full strength in the BUE, BLE  SENSORY:  normal and symmetric to light touch  COORDINATION:  finger-nose-finger, fine finger movements normal  REFLEXES:  deep tendon reflexes TRACE and symmetric  GAIT/STATION:  narrow based gait; CAUTIOUS; HAS WALKER     DIAGNOSTIC DATA (LABS, IMAGING, TESTING) - I reviewed patient records, labs, notes, testing and imaging myself where available.  Lab Results  Component Value Date   WBC 8.9 12/21/2021   HGB 12.3 12/21/2021   HCT 37.1 12/21/2021   MCV 85.3 12/21/2021   PLT 282 12/21/2021      Component Value Date/Time   NA 135 01/01/2022 0629   K 4.5  01/01/2022 0629   CL 102 01/01/2022 0629   CO2 26 01/01/2022 0629   GLUCOSE 103 (H) 01/01/2022 0629   BUN 24 (H) 01/01/2022 0629   CREATININE 1.14 (H) 01/01/2022 0629   CALCIUM 8.9 01/01/2022 0629   PROT 6.9 01/01/2022 0629   ALBUMIN 3.1 (L) 01/01/2022 0629   AST 18 01/01/2022 0629   ALT 23 01/01/2022 0629   ALKPHOS 82 01/01/2022 0629   BILITOT 0.4 01/01/2022 0629   GFRNONAA 47 (L) 01/01/2022 0629   GFRAA >60 03/25/2018 1701   Lab Results  Component Value Date   CHOL 90 12/15/2021   HDL 42 12/15/2021   LDLCALC 27 12/15/2021   TRIG 103 12/15/2021   CHOLHDL 2.1 12/15/2021   Lab Results  Component Value Date   HGBA1C 5.7 (H) 12/15/2021   No results found for: "VITAMINB12" Lab Results  Component Value Date   TSH 0.678 01/27/2017    12/15/21 TTE 1. Left ventricular ejection fraction, by estimation, is 55 to 60%. The  left ventricle has normal function. The left ventricle has no regional  wall motion abnormalities. Left ventricular diastolic parameters are  consistent with Grade I diastolic  dysfunction (impaired relaxation).   2. Right ventricular systolic function is normal. The right ventricular  size is normal. There is normal pulmonary artery systolic pressure.   3. The mitral valve is normal in structure. Mild mitral valve  regurgitation. No evidence of mitral stenosis.   4. The aortic valve is tricuspid. There is mild calcification of the  aortic valve. Aortic valve regurgitation is trivial. No aortic stenosis is  present.   5. The inferior vena cava is normal in size with greater than 50%  respiratory variability, suggesting right atrial pressure of 3 mmHg.    12/14/21 MRI brain [I reviewed images myself. Subtle right cerebellar DWI hyperintensity; stroke vs artifact. -VRP]  1. Normal MRI appearance of the brain for age. No acute or focal lesion to explain the patient's symptoms. 2. Right supraorbital soft tissue swelling is new since  the  prior exam.   12/14/21 CTA head / neck 1. Negative for large vessel occlusion, but Positive for complex plaque at the Left ICA origin where it's difficult to exclude ADHERENT THROMBUS within the vessel lumen (series 12, image 87, but alternatively this appearance might be ulcerated plaque and/or an unusual carotid web. Subsequent stenosis numerically estimated at 60%.   2. Positive also for evidence of cervical Right ICA Fibromuscular Dysplasia (FMD).   3. But otherwise mild for age atherosclerosis, and essentially negative intracranial anterior circulation with no other arterial stenosis identified.   4. Stable CT appearance of the brain, No acute intracranial abnormality.   5. Aortic Atherosclerosis (ICD10-I70.0) and Emphysema (ICD10-J43.9). Tortuous aortic arch.   6. 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    ASSESSMENT AND PLAN  88 y.o. year old female here with:   Dx:  1. Vertigo   2. Cerebrovascular accident (CVA), unspecified mechanism ([facility])       PLAN:  88 year old with history of A-fib not anticoagulated with sudden onset of dizziness found to have right cerebellar stroke (subtle finding on MRI; although official report says no acute finding).   - continue eliquis, BP control, statin - continue PT, OT exercises at home  Return for return to PCP.    Suanne Marker, MD 02/14/2022, 12:19 PM Certified in Neurology, Neurophysiology and Neuroimaging  [facility] [address], [address] [place], [place] [address] [phone]

## 2022-02-16 ENCOUNTER — Encounter: Payer: Self-pay | Admitting: Physical Therapy

## 2022-02-16 ENCOUNTER — Ambulatory Visit: Payer: Medicare (Managed Care) | Admitting: Physical Therapy

## 2022-02-16 VITALS — BP 134/64 | HR 58

## 2022-02-16 DIAGNOSIS — R278 Other lack of coordination: Secondary | ICD-10-CM

## 2022-02-16 DIAGNOSIS — R2681 Unsteadiness on feet: Secondary | ICD-10-CM

## 2022-02-16 DIAGNOSIS — M6281 Muscle weakness (generalized): Secondary | ICD-10-CM

## 2022-02-16 DIAGNOSIS — R269 Unspecified abnormalities of gait and mobility: Secondary | ICD-10-CM

## 2022-02-16 DIAGNOSIS — R293 Abnormal posture: Secondary | ICD-10-CM

## 2022-02-16 NOTE — Therapy (Signed)
OUTPATIENT PHYSICAL THERAPY NEURO TREATMENT / PROGRESS NOTE   Patient Name: [name] MRN: [id number] DOB:Apr 01, 1934, 88 y.o., female Today's Date: 02/16/2022  PCP: Blair Heys REFERRING PROVIDER: Jacquelynn Cree, PA-C  END OF SESSION:  PT End of Session - 02/16/22 1200     Visit Number 10    Number of Visits 17    Date for PT Re-Evaluation 03/08/22    Authorization Type [employer]    PT Start Time 1157    PT Stop Time 1230   treatment time shortened due to patient's late arrival to session   PT Time Calculation (min) 33 min    Equipment Utilized During Treatment Gait belt    Activity Tolerance Patient tolerated treatment well    Behavior During Therapy WFL for tasks assessed/performed             PT End of Session - 02/16/22 1200     Visit Number 10    Number of Visits 17    Date for PT Re-Evaluation 03/08/22    Authorization Type [employer]age    PT Start Time 1157    PT Stop Time 1230   treatment time shortened due to patient's late arrival to session   PT Time Calculation (min) 33 min    Equipment Utilized During Treatment Gait belt    Activity Tolerance Patient tolerated treatment well    Behavior During Therapy WFL for tasks assessed/performed               Past Medical History:  Diagnosis Date   Arthritis    Atrial fibrillation (HCC)    Carotid artery occlusion    Colitis    COPD (chronic obstructive pulmonary disease) (HCC)    Diverticulitis    Hypertension    Keratosis, seborrheic    Lumbar spondylosis    w/impingement L3-S1   Past Surgical History:  Procedure Laterality Date   ABDOMINAL HYSTERECTOMY     BREAST EXCISIONAL BIOPSY Left    BREAST EXCISIONAL BIOPSY Left    BREAST EXCISIONAL BIOPSY Right    BREAST SURGERY     cyst removal   CAROTID ENDARTERECTOMY Left 2010   CHOLECYSTECTOMY     EYE SURGERY Bilateral 06/2014   cataracts   JOINT REPLACEMENT     RIGHT KNEE   REPLACEMENT TOTAL KNEE Right     Patient Active Problem List   Diagnosis Date Noted   Cerebellar stroke (HCC) 12/20/2021   Acute ischemic stroke (HCC) 12/16/2021   Vertigo 12/14/2021   CVA (cerebral vascular accident) (HCC) 12/14/2021   Nausea and vomiting 12/14/2021   Hypokalemia 12/14/2021   Leukocytosis 12/14/2021   Essential hypertension 12/14/2021   Anxiety 12/14/2021   Hyperlipidemia 12/14/2021   Carotid artery disease (HCC) 12/14/2021   Osteoarthritis cervical spine 09/21/2020   Compression fracture of T12 vertebra (HCC) 04/16/2018   Paroxysmal atrial fibrillation (HCC) 03/03/2018   SBO (small bowel obstruction) (HCC) 01/26/2017   Encounter for postoperative carotid endarterectomy surveillance 09/21/2015   Occlusion and stenosis of carotid artery without mention of cerebral infarction 02/05/2012    ONSET DATE: 01/01/2022 (referral date)  REFERRING DIAG:  I63.9 (ICD-10-CM) - Cerebral infarction, unspecified    THERAPY DIAG:  Unsteadiness on feet  Abnormality of gait and mobility  Other lack of coordination  Muscle weakness (generalized)  Abnormal posture  Rationale for Evaluation and Treatment: Rehabilitation  SUBJECTIVE:  SUBJECTIVE STATEMENT: PT orients to patient's left side throughout session as she hears better out of left ear.   Patient reports that she has good days and bad days. She denies falls and reports starting on Claritin for her allergies. Is otherwise doing well.   Pt accompanied by: friend - Collie Siad  PERTINENT HISTORY:   Taken from 01/01/2022 Note:  Brief HPI:   [name] is a 88 y.o. right-handed female with history of hypertension, PAF, left CEA, COPD, lumbar spondylosis, SBO.  Admitted 12/14/2021 with reports of dizziness and nausea with emesis and inability to get out of bed.  She  also reported a fall a week prior to admission and then struck her head without any significant injuries.  She was noted to have atrial fibrillation with elevated blood pressure and potassium 2.8.  CTA head and neck negative for LVO and showed complex plaque left ICA origin with 60% stenosis and evidence of right ICA fibromuscular dysplasia.  MRI of the brain showed normal brain for age and no acute abnormality.  Scan reviewed by neurology services felt that patient with small cerebellar infarct not seen on films likely embolic due to PAF.  She was started on Eliquis as patient agreeable.  Echo with ejection fraction of 55 to 60%.  KUB showed moderate marked and moderate left hydronephrosis with markedly distended bladder concerning for obstruction with a Foley tube recommended.  Also reported to have horizontal nystagmus with dizziness and movement due to vestibular dysfunction.  On 11/26 she developed A-fib with RVR rates of 170 treated with IV metoprolol.  Therapy evaluations completed due to patient decreased functional mobility was admitted for a comprehensive rehab program.     Hospital Course: ZEYNA MKRTCHYAN was admitted to rehab 12/20/2021 for inpatient therapies to consist of PT, ST and OT at least three hours five days a week. Past admission physiatrist, therapy team and rehab RN have worked together to provide customized collaborative inpatient rehab.  Pertaining to patient's right cerebellar CVA remained stable she remained on Eliquis therapy and would follow-up with neurology services.  Bilateral lower extremity neuropathy with the addition of gabapentin chronic back pain had injections in the past in [place].  X-rays lumbar showed severe DDD spondylosis.  Atrial fibrillation controlled remained on Tenormin as well as Eliquis.  Blood pressure soft and monitored will need outpatient follow-up.  AKI improved with latest creatinine 1.13.  Bilateral hydronephrosis on KUB initially on Ditropan that was  discontinued.  Renal ultrasound follow-up showed no hydronephrosis.  Bouts of constipation resolved with laxative assistance.  Mood stabilization maintained on Zoloft with emotional support provided    PAIN:  Are you having pain? No  PRECAUTIONS: Fall  WEIGHT BEARING RESTRICTIONS: No  FALLS: Has patient fallen in last 6 months? Yes. Number of falls 1; happened about 10 days before patient had the stroke; patient tripped over family member, she fell on concrete and hit her head  LIVING ENVIRONMENT: Lives with: lives alone Lives in: House/apartment Stairs: Yes: External: 3 steps; on right going up Has following equipment at home: Single point cane, Walker - 2 wheeled, Wheelchair (manual), Shower bench, bed side commode, and Grab bars  PLOF: Independent  PATIENT GOALS: "Learning to walk by myself." (Referring to walking without walker)  OBJECTIVE:   Vitals:   02/16/22 1204  BP: 134/64  Pulse: (!) 58    DIAGNOSTIC FINDINGS:   See above  TODAY'S TREATMENT:  NRM: 10 MWT: 13.4" (0.74 m/s) with FWW + SBA, 17.03" with SBQC + CGA (0.59 m/s) TUG Trials:  15.42" and 15.43" with FWW + SBA; 16.26", 15.47", 14.47" (x1 wobble) with the SBQC + CGA SPC obstacle navigation around 6 circles with step up/off red floor mat x 2 (CGA -minA)  Pt requires seated rest between tasks.  PATIENT EDUCATION: Education details: Continue HEP. Continue use of FWW. Person educated: Patient Education method: Explanation Education comprehension: verbalized understanding and needs further education  HOME EXERCISE PROGRAM: Recommended slowly increasing ambulation with supervision in setting with places for seated breaks as needed  Access Code: [email or web address] URL: [email or web address] Date: 02/02/2022 Prepared by: Malachi Carl  Exercises - Corner Balance Feet Together With Eyes Open  - 1 x daily - 7 x weekly - 3 sets - 30 hold - Corner Balance Feet Together With Eyes Closed   - 1 x daily - 7 x weekly - 3 sets - 30 hold - Standing March with Counter Support  - 1 x daily - 7 x weekly - 3 sets - 10 reps - Sit to Stand with Armchair  - 1 x daily - 7 x weekly - 3 sets - 5 reps - Standing Tandem Balance with Counter Support  - 1 x daily - 7 x weekly - 3 sets - 30 hold - Supine Bridge  - 1 x daily - 7 x weekly - 2 sets - 10 reps - Supine Active Straight Leg Raise  - 1 x daily - 7 x weekly - 3 sets - 10 reps  GOALS: Goals reviewed with patient? Yes  SHORT TERM GOALS: Target date: 02/08/2022  Patient will demonstrate 100% compliance with initial HEP to continue to progress between physical therapy sessions.   Baseline: To be provided; Provided; 80% compliance on 02/09/2022; Patient continues to report questions about exercises and requires reinforcement on 02/16/2022 Goal status: IN PROGRESS  2.  Patient will improve TUG score by 3" seconds with LRAD to indicate clinically significant progress towards a decreased risk of falls and improved mobility.  Baseline: 26.87" with FWW and 26.84" without walker unsteady; 20.2" with FWW + SBA; 21.46" w/o AD + CGA; 15.42" and 15.43" with FWW + SBA/16.26", 15.47", 14.47" (x1 wobble) with the [facility] + CGA on 02/16/2022 Goal status: MET  3.  Patient will improve ABC Score to 34% or greater to indicate a decreased risk for falls and improved self-reported confidence in balance and sense of steadiness.   Baseline: 20%; 02/09/2022 to 76% Goal status: MET  4.  Patient will improve gait speed to 0.8 m/s with LRAD to indicate improvement to the level of community ambulator in order to participate more easily in activities outside of the home.    Baseline: 0.48 m/s; improved to 0.65 m/s on 02/09/2022; 02/06/2022 improved to 13.4" (0.74 m/s) with FWW + SBA, 17.03" with SBQC + CGA (0.59 m/s) Goal status: IN PROGRESS  LONG TERM GOALS: Target date: 03/08/2022   Patient will report demonstrate independence with final HEP in order to maintain current  gains and continue to progress after physical therapy discharge.   Baseline: To be provided Goal status: INITIAL  2.  Patient will improve TUG score to 13.5" or less with LRAD to indicate a decreased risk of falls and demonstrate improved overall mobility.   Baseline: 26.87" with FWW and 26.84" without walker unsteady; 20.2" with FWW + SBA; 21.46" w/o AD + CGA; 15.42" and 15.43" with FWW + SBA/16.26", 15.47", 14.47" (x1 wobble) with  the [facility] + CGA on 02/16/2022 Goal status: IN PROGRESS  3.  Patient will improve ABC Score to 67% or greater to indicate a decreased risk for falls and improved self-reported confidence in balance and sense of steadiness.   Baseline: 20%; 02/09/2022 to 76% Goal status: MET  4.  Patient will improve gait speed to 0.85 m/s or greater to indicate a reduced risk for falls.   Baseline: 0.48 m/s; improved to 0.65 m/s on 02/09/2022; improved to 0.65 m/s on 02/09/2022; 02/06/2022 improved to 13.4" (0.74 m/s) with FWW + SBA, 17.03" with SBQC + CGA (0.59 m/s) Goal status: REVISED  5.  Patient will demonstrate ability to come to stand from chair without use of UE to demonstrate improve LE strength.  Baseline: requires bilateral use of UE; sit to stand without UE support x 1 on 02/09/2022 Goal status: MET  ASSESSMENT:  CLINICAL IMPRESSION: Session was a 10th visit progress assessment. Patient continues to make significant progress towards goal including increase in gait speed from 0.65 m/s to 0.74 m/s with FWW and SBA. Also trialed gait speed with SBQC and patient ambulated 0.59 m/s with CGA. Assessed TUG and patient had improved from 20.2" to 15.42" with FWW and SBA. Trialed with [facility] and patient improved to 14.47" but demonstrated x1 instance of unsteadiness. Patient asked if she thought she would ever be able to go back to cane and therapist stated it would pend on progress but at this time the FWW is the safest/recommended device. Trialed SBQC with obstacle navigation and  threshold level step ups onto red mat; patient had a difficult time recalling instructions, planning movements, and required CGA-minA to complete safely. She continues to benefit from skilled PT to address functional strength and balance to promote improved safety of mobility.  OBJECTIVE IMPAIRMENTS: Abnormal gait, decreased activity tolerance, decreased balance, decreased coordination, decreased mobility, difficulty walking, and decreased strength.   ACTIVITY LIMITATIONS: standing, squatting, stairs, and transfers  PARTICIPATION LIMITATIONS: driving, shopping, and community activity  PERSONAL FACTORS: Age, Past/current experiences, and Transportation are also affecting patient's functional outcome.   REHAB POTENTIAL: Good  CLINICAL DECISION MAKING: Evolving/moderate complexity  EVALUATION COMPLEXITY: Moderate  PLAN:  PT FREQUENCY: 2x/week  PT DURATION: 8 weeks  PLANNED INTERVENTIONS: Therapeutic exercises, Therapeutic activity, Neuromuscular re-education, Balance training, Gait training, Patient/Family education, and Self Care  PLAN FOR NEXT SESSION: Dynamic balance training, LE strengthening, reactive stepping; SLS in preparation for showering/step overs etc, head turns, cognitive dual tasking; endurance tasks for standing/gait, rockerboard and airex tasks; gait speed laps in gym for time-other reaction time tasks maybe w/ UE/LE (blaze pods, etc), SBQC use in session with functional tasks  Malachi Carl, PT, DPT 02/16/2022, 1:48 PM

## 2022-02-21 ENCOUNTER — Encounter: Payer: Self-pay | Admitting: Physical Therapy

## 2022-02-21 ENCOUNTER — Ambulatory Visit: Payer: Medicare (Managed Care) | Admitting: Physical Therapy

## 2022-02-21 DIAGNOSIS — R278 Other lack of coordination: Secondary | ICD-10-CM

## 2022-02-21 DIAGNOSIS — R2681 Unsteadiness on feet: Secondary | ICD-10-CM

## 2022-02-21 DIAGNOSIS — M6281 Muscle weakness (generalized): Secondary | ICD-10-CM

## 2022-02-21 DIAGNOSIS — R293 Abnormal posture: Secondary | ICD-10-CM

## 2022-02-21 DIAGNOSIS — R269 Unspecified abnormalities of gait and mobility: Secondary | ICD-10-CM

## 2022-02-21 NOTE — Therapy (Signed)
OUTPATIENT PHYSICAL THERAPY NEURO TREATMENT   Patient Name: [name] MRN: [id number] DOB:August 27, 1934, 88 y.o., female Today's Date: 02/21/2022  PCP: Blair Heys REFERRING PROVIDER: Jacquelynn Cree, PA-C  END OF SESSION:  PT End of Session - 02/21/22 1006     Visit Number 11    Number of Visits 17    Date for PT Re-Evaluation 03/08/22    Authorization Type [employer]age    Progress Note Due on Visit 20    PT Start Time 1003   Pt agreeable to being seen early   PT Stop Time 1046    PT Time Calculation (min) 43 min    Equipment Utilized During Treatment Gait belt    Activity Tolerance Patient tolerated treatment well    Behavior During Therapy WFL for tasks assessed/performed               Past Medical History:  Diagnosis Date   Arthritis    Atrial fibrillation (HCC)    Carotid artery occlusion    Colitis    COPD (chronic obstructive pulmonary disease) (HCC)    Diverticulitis    Hypertension    Keratosis, seborrheic    Lumbar spondylosis    w/impingement L3-S1   Past Surgical History:  Procedure Laterality Date   ABDOMINAL HYSTERECTOMY     BREAST EXCISIONAL BIOPSY Left    BREAST EXCISIONAL BIOPSY Left    BREAST EXCISIONAL BIOPSY Right    BREAST SURGERY     cyst removal   CAROTID ENDARTERECTOMY Left 2010   CHOLECYSTECTOMY     EYE SURGERY Bilateral 06/2014   cataracts   JOINT REPLACEMENT     RIGHT KNEE   REPLACEMENT TOTAL KNEE Right    Patient Active Problem List   Diagnosis Date Noted   Cerebellar stroke (HCC) 12/20/2021   Acute ischemic stroke (HCC) 12/16/2021   Vertigo 12/14/2021   CVA (cerebral vascular accident) (HCC) 12/14/2021   Nausea and vomiting 12/14/2021   Hypokalemia 12/14/2021   Leukocytosis 12/14/2021   Essential hypertension 12/14/2021   Anxiety 12/14/2021   Hyperlipidemia 12/14/2021   Carotid artery disease (HCC) 12/14/2021   Osteoarthritis cervical spine 09/21/2020   Compression fracture of T12 vertebra (HCC)  04/16/2018   Paroxysmal atrial fibrillation (HCC) 03/03/2018   SBO (small bowel obstruction) (HCC) 01/26/2017   Encounter for postoperative carotid endarterectomy surveillance 09/21/2015   Occlusion and stenosis of carotid artery without mention of cerebral infarction 02/05/2012    ONSET DATE: 01/01/2022 (referral date)  REFERRING DIAG:  I63.9 (ICD-10-CM) - Cerebral infarction, unspecified    THERAPY DIAG:  Unsteadiness on feet  Abnormality of gait and mobility  Other lack of coordination  Muscle weakness (generalized)  Abnormal posture  Rationale for Evaluation and Treatment: Rehabilitation  SUBJECTIVE:  SUBJECTIVE STATEMENT: PT orients to patient's left side throughout session as she hears better out of left ear.   Patient reports that she is doing well today, presents with niece Arline Asp this visit.  She further states she hopes to not take Gabapentin as she feels she does not have nerve pain at this time, but will discuss with MD.  She denies pain, falls or near falls at home.  Pt accompanied by: family member - niece Arline Asp  PERTINENT HISTORY:   Taken from 01/01/2022 Note:  Brief HPI:   MAYCI HANING is a 88 y.o. right-handed female with history of hypertension, PAF, left CEA, COPD, lumbar spondylosis, SBO.  Admitted 12/14/2021 with reports of dizziness and nausea with emesis and inability to get out of bed.  She also reported a fall a week prior to admission and then struck her head without any significant injuries.  She was noted to have atrial fibrillation with elevated blood pressure and potassium 2.8.  CTA head and neck negative for LVO and showed complex plaque left ICA origin with 60% stenosis and evidence of right ICA fibromuscular dysplasia.  MRI of the brain showed normal brain for age  and no acute abnormality.  Scan reviewed by neurology services felt that patient with small cerebellar infarct not seen on films likely embolic due to PAF.  She was started on Eliquis as patient agreeable.  Echo with ejection fraction of 55 to 60%.  KUB showed moderate marked and moderate left hydronephrosis with markedly distended bladder concerning for obstruction with a Foley tube recommended.  Also reported to have horizontal nystagmus with dizziness and movement due to vestibular dysfunction.  On 11/26 she developed A-fib with RVR rates of 170 treated with IV metoprolol.  Therapy evaluations completed due to patient decreased functional mobility was admitted for a comprehensive rehab program.     Hospital Course: MARNETTE PERKINS was admitted to rehab 12/20/2021 for inpatient therapies to consist of PT, ST and OT at least three hours five days a week. Past admission physiatrist, therapy team and rehab RN have worked together to provide customized collaborative inpatient rehab.  Pertaining to patient's right cerebellar CVA remained stable she remained on Eliquis therapy and would follow-up with neurology services.  Bilateral lower extremity neuropathy with the addition of gabapentin chronic back pain had injections in the past in [place].  X-rays lumbar showed severe DDD spondylosis.  Atrial fibrillation controlled remained on Tenormin as well as Eliquis.  Blood pressure soft and monitored will need outpatient follow-up.  AKI improved with latest creatinine 1.13.  Bilateral hydronephrosis on KUB initially on Ditropan that was discontinued.  Renal ultrasound follow-up showed no hydronephrosis.  Bouts of constipation resolved with laxative assistance.  Mood stabilization maintained on Zoloft with emotional support provided    PAIN:  Are you having pain? No  PRECAUTIONS: Fall  WEIGHT BEARING RESTRICTIONS: No  FALLS: Has patient fallen in last 6 months? Yes. Number of falls 1; happened about 10 days before  patient had the stroke; patient tripped over family member, she fell on concrete and hit her head  LIVING ENVIRONMENT: Lives with: lives alone Lives in: House/apartment Stairs: Yes: External: 3 steps; on right going up Has following equipment at home: Single point cane, Walker - 2 wheeled, Wheelchair (manual), Shower bench, bed side commode, and Grab bars  PLOF: Independent  PATIENT GOALS: "Learning to walk by myself." (Referring to walking without walker)  OBJECTIVE:   There were no vitals filed for this visit.  DIAGNOSTIC FINDINGS:   See above  TODAY'S TREATMENT:                RAMP:  Level of Assistance: CGA and Min A Assistive device utilized:  rubber tip quad cane Ramp Comments: Performed x2, cued for quad engagement, sequencing w/ AD especially on descent for safety, and tips for controlling momentum w/ improved posture.  CURB:  Level of Assistance: CGA and Min A Assistive device utilized:  rubber tip quad cane Curb Comments: Performed x2, pt fatigued requiring seated rest following, but demonstrates good carryover from stair education as single trial performed before and after stairs.  MinA on initial descent w/ pt landing w/ both knees in increased flexion.  STAIRS:  Level of Assistance: CGA  Stair Negotiation Technique: Step to Pattern Forwards With use of AD: rubber tip quad cane  with Single Rail on Left  Number of Stairs: 4   Height of Stairs: 6"  Comments: Cued for sequencing, switching cane sides when descending if only single rail available, step-to vs reciprocal stepping patterns and pt likely being safest with step-to.  GAIT: Gait pattern:  severe forward head and kyphotic posture, step through pattern, decreased arm swing- Left, decreased stride length, knee flexed in stance- Right, knee flexed in stance- Left, and trunk flexed Distance walked: 53' Assistive device utilized:  rubber tip quad cane Level of assistance: CGA Comments: Pt verbalizes good  understanding of sequencing w/ minimal need for cueing to promote efficiency of gait and AD use.  Single incident of left toe catch w/ minA to recover.  Obstacle course x3 setups, CGA-minA (x2 each setup): -Setup 1: 0.5" and 2" hurdle, 4 cones (cued to prevent narrowed BOS w/ turning, pt loses balance when not overtly focused on cane) -Setup 2:  2" and 4" hurdle, 4 cones (performs hurdles best w/ strong/left LE leading, cues for improved obstacle approximation) -Setup 3:  2" and 4" hurdle, blue mat, 3 cones (pt w/ difficulty tapping cone)  PATIENT EDUCATION: Education details: Continue HEP. Continue use of FWW. Person educated: Patient Education method: Explanation Education comprehension: verbalized understanding and needs further education  HOME EXERCISE PROGRAM: Recommended slowly increasing ambulation with supervision in setting with places for seated breaks as needed  Access Code: [email or web address] URL: [email or web address] Date: 02/02/2022 Prepared by: Malachi Carl  Exercises - Corner Balance Feet Together With Eyes Open  - 1 x daily - 7 x weekly - 3 sets - 30 hold - Corner Balance Feet Together With Eyes Closed  - 1 x daily - 7 x weekly - 3 sets - 30 hold - Standing March with Counter Support  - 1 x daily - 7 x weekly - 3 sets - 10 reps - Sit to Stand with Armchair  - 1 x daily - 7 x weekly - 3 sets - 5 reps - Standing Tandem Balance with Counter Support  - 1 x daily - 7 x weekly - 3 sets - 30 hold - Supine Bridge  - 1 x daily - 7 x weekly - 2 sets - 10 reps - Supine Active Straight Leg Raise  - 1 x daily - 7 x weekly - 3 sets - 10 reps  GOALS: Goals reviewed with patient? Yes  SHORT TERM GOALS: Target date: 02/08/2022  Patient will demonstrate 100% compliance with initial HEP to continue to progress between physical therapy sessions.   Baseline: To be provided; Provided; 80% compliance on 02/09/2022; Patient continues to report questions about exercises and requires  reinforcement  on 02/16/2022 Goal status: IN PROGRESS  2.  Patient will improve TUG score by 3" seconds with LRAD to indicate clinically significant progress towards a decreased risk of falls and improved mobility.  Baseline: 26.87" with FWW and 26.84" without walker unsteady; 20.2" with FWW + SBA; 21.46" w/o AD + CGA; 15.42" and 15.43" with FWW + SBA/16.26", 15.47", 14.47" (x1 wobble) with the [facility] + CGA on 02/16/2022 Goal status: MET  3.  Patient will improve ABC Score to 34% or greater to indicate a decreased risk for falls and improved self-reported confidence in balance and sense of steadiness.   Baseline: 20%; 02/09/2022 to 76% Goal status: MET  4.  Patient will improve gait speed to 0.8 m/s with LRAD to indicate improvement to the level of community ambulator in order to participate more easily in activities outside of the home.    Baseline: 0.48 m/s; improved to 0.65 m/s on 02/09/2022; 02/06/2022 improved to 13.4" (0.74 m/s) with FWW + SBA, 17.03" with SBQC + CGA (0.59 m/s) Goal status: IN PROGRESS  LONG TERM GOALS: Target date: 03/08/2022   Patient will report demonstrate independence with final HEP in order to maintain current gains and continue to progress after physical therapy discharge.   Baseline: To be provided Goal status: INITIAL  2.  Patient will improve TUG score to 13.5" or less with LRAD to indicate a decreased risk of falls and demonstrate improved overall mobility.   Baseline: 26.87" with FWW and 26.84" without walker unsteady; 20.2" with FWW + SBA; 21.46" w/o AD + CGA; 15.42" and 15.43" with FWW + SBA/16.26", 15.47", 14.47" (x1 wobble) with the [facility] + CGA on 02/16/2022 Goal status: IN PROGRESS  3.  Patient will improve ABC Score to 67% or greater to indicate a decreased risk for falls and improved self-reported confidence in balance and sense of steadiness.   Baseline: 20%; 02/09/2022 to 76% Goal status: MET  4.  Patient will improve gait speed to 0.85 m/s or greater to  indicate a reduced risk for falls.   Baseline: 0.48 m/s; improved to 0.65 m/s on 02/09/2022; improved to 0.65 m/s on 02/09/2022; 02/06/2022 improved to 13.4" (0.74 m/s) with FWW + SBA, 17.03" with SBQC + CGA (0.59 m/s) Goal status: REVISED  5.  Patient will demonstrate ability to come to stand from chair without use of UE to demonstrate improve LE strength.  Baseline: requires bilateral use of UE; sit to stand without UE support x 1 on 02/09/2022 Goal status: MET  ASSESSMENT:  CLINICAL IMPRESSION: Pt seen for skilled PT session today focused on safe mobility using rubber tip quad cane with pt able to tolerate for duration of session vs RW.  She needs varying levels of assist depending on dynamic nature of task ranging from SBA-minA.  She has most difficulty descending stairs and curb step.  In future visits, it would be beneficial to address SLS for both functional hip strength and static balance as pt progresses to LRAD.  Will continue POC.  OBJECTIVE IMPAIRMENTS: Abnormal gait, decreased activity tolerance, decreased balance, decreased coordination, decreased mobility, difficulty walking, and decreased strength.   ACTIVITY LIMITATIONS: standing, squatting, stairs, and transfers  PARTICIPATION LIMITATIONS: driving, shopping, and community activity  PERSONAL FACTORS: Age, Past/current experiences, and Transportation are also affecting patient's functional outcome.   REHAB POTENTIAL: Good  CLINICAL DECISION MAKING: Evolving/moderate complexity  EVALUATION COMPLEXITY: Moderate  PLAN:  PT FREQUENCY: 2x/week  PT DURATION: 8 weeks  PLANNED INTERVENTIONS: Therapeutic exercises, Therapeutic activity, Neuromuscular re-education, Balance  training, Gait training, Patient/Family education, and Self Care  PLAN FOR NEXT SESSION: Dynamic balance training, LE strengthening, reactive stepping; head turns, cognitive dual tasking; endurance, rockerboard, other reaction time tasks maybe w/ UE/LE (blaze  pods, etc), SBQC use in session with functional tasks-especially curb/ramp/compliant surface (maybe blaze pods and obstacle course?), SLS for strength and balance-toe taps varying heights (try gumdrops?), She might do well with treadmill interval training?  Elease Etienne, PT, DPT 02/21/2022, 1:13 PM

## 2022-02-23 ENCOUNTER — Encounter: Payer: Self-pay | Admitting: Physical Therapy

## 2022-02-23 ENCOUNTER — Ambulatory Visit: Payer: Medicare (Managed Care) | Attending: Physical Medicine and Rehabilitation | Admitting: Physical Therapy

## 2022-02-23 VITALS — BP 128/53 | HR 59

## 2022-02-23 DIAGNOSIS — R278 Other lack of coordination: Secondary | ICD-10-CM | POA: Diagnosis not present

## 2022-02-23 DIAGNOSIS — R2681 Unsteadiness on feet: Secondary | ICD-10-CM | POA: Diagnosis not present

## 2022-02-23 DIAGNOSIS — R293 Abnormal posture: Secondary | ICD-10-CM | POA: Diagnosis not present

## 2022-02-23 DIAGNOSIS — M6281 Muscle weakness (generalized): Secondary | ICD-10-CM | POA: Insufficient documentation

## 2022-02-23 DIAGNOSIS — R269 Unspecified abnormalities of gait and mobility: Secondary | ICD-10-CM | POA: Insufficient documentation

## 2022-02-23 NOTE — Therapy (Signed)
OUTPATIENT PHYSICAL THERAPY NEURO TREATMENT   Patient Name: [name] MRN: [id number] DOB:1934/11/02, 88 y.o., female Today's Date: 02/23/2022  PCP: [name] REFERRING PROVIDER: Bary Leriche, PA-C  END OF SESSION:  PT End of Session - 02/23/22 1140     Visit Number 12    Number of Visits 17    Date for PT Re-Evaluation 03/08/22    Authorization Type [employer]vantage    Progress Note Due on Visit 60    PT Start Time 1145    PT Stop Time 1227    PT Time Calculation (min) 42 min    Equipment Utilized During Treatment Gait belt    Activity Tolerance Patient tolerated treatment well    Behavior During Therapy WFL for tasks assessed/performed               Past Medical History:  Diagnosis Date   Arthritis    Atrial fibrillation (HCC)    Carotid artery occlusion    Colitis    COPD (chronic obstructive pulmonary disease) ([place])    Diverticulitis    Hypertension    Keratosis, seborrheic    Lumbar spondylosis    w/impingement L3-S1   Past Surgical History:  Procedure Laterality Date   ABDOMINAL HYSTERECTOMY     BREAST EXCISIONAL BIOPSY Left    BREAST EXCISIONAL BIOPSY Left    BREAST EXCISIONAL BIOPSY Right    BREAST SURGERY     cyst removal   CAROTID ENDARTERECTOMY Left 2010   CHOLECYSTECTOMY     EYE SURGERY Bilateral 06/2014   cataracts   JOINT REPLACEMENT     RIGHT KNEE   REPLACEMENT TOTAL KNEE Right    Patient Active Problem List   Diagnosis Date Noted   Cerebellar stroke ([place]) 12/20/2021   Acute ischemic stroke ([place]) 12/16/2021   Vertigo 12/14/2021   CVA (cerebral vascular accident) ([place]) 12/14/2021   Nausea and vomiting 12/14/2021   Hypokalemia 12/14/2021   Leukocytosis 12/14/2021   Essential hypertension 12/14/2021   Anxiety 12/14/2021   Hyperlipidemia 12/14/2021   Carotid artery disease ([place]) 12/14/2021   Osteoarthritis cervical spine 09/21/2020   Compression fracture of T12 vertebra ([place]) 04/16/2018   Paroxysmal atrial  fibrillation ([place]) 03/03/2018   SBO (small bowel obstruction) ([place]) 01/26/2017   Encounter for postoperative carotid endarterectomy surveillance 09/21/2015   Occlusion and stenosis of carotid artery without mention of cerebral infarction 02/05/2012    ONSET DATE: 01/01/2022 (referral date)  REFERRING DIAG:  I63.9 (ICD-10-CM) - Cerebral infarction, unspecified    THERAPY DIAG:  Unsteadiness on feet  Abnormality of gait and mobility  Other lack of coordination  Muscle weakness (generalized)  Abnormal posture  Rationale for Evaluation and Treatment: Rehabilitation  SUBJECTIVE:  SUBJECTIVE STATEMENT: PT orients to patient's left side throughout session as she hears better out of left ear.   Patient reports to clinic and states she is doing alright. Continues to report good/bad days. Reports no longer taking gabapentin per her doctor's instruction as patient had read the literature and wan't sure that it would be helpful. States doctor agreed for her to stop. Reports that she was able to do EC balance in corner for 90".   Pt accompanied by: friend - [name]  PERTINENT HISTORY:   Taken from 01/01/2022 Note:  Brief HPI:   [name] is a 88 y.o. right-handed female with history of hypertension, PAF, left CEA, COPD, lumbar spondylosis, SBO.  Admitted 12/14/2021 with reports of dizziness and nausea with emesis and inability to get out of bed.  She also reported a fall a week prior to admission and then struck her head without any significant injuries.  She was noted to have atrial fibrillation with elevated blood pressure and potassium 2.8.  CTA head and neck negative for LVO and showed complex plaque left ICA origin with 60% stenosis and evidence of right ICA fibromuscular dysplasia.  MRI of the brain  showed normal brain for age and no acute abnormality.  Scan reviewed by neurology services felt that patient with small cerebellar infarct not seen on films likely embolic due to PAF.  She was started on Eliquis as patient agreeable.  Echo with ejection fraction of 55 to 60%.  KUB showed moderate marked and moderate left hydronephrosis with markedly distended bladder concerning for obstruction with a Foley tube recommended.  Also reported to have horizontal nystagmus with dizziness and movement due to vestibular dysfunction.  On 11/26 she developed A-fib with RVR rates of 170 treated with IV metoprolol.  Therapy evaluations completed due to patient decreased functional mobility was admitted for a comprehensive rehab program.     Hospital Course: [name] was admitted to rehab 12/20/2021 for inpatient therapies to consist of PT, ST and OT at least three hours five days a week. Past admission physiatrist, therapy team and rehab RN have worked together to provide customized collaborative inpatient rehab.  Pertaining to patient's right cerebellar CVA remained stable she remained on Eliquis therapy and would follow-up with neurology services.  Bilateral lower extremity neuropathy with the addition of gabapentin chronic back pain had injections in the past in [place].  X-rays lumbar showed severe DDD spondylosis.  Atrial fibrillation controlled remained on Tenormin as well as Eliquis.  Blood pressure soft and monitored will need outpatient follow-up.  AKI improved with latest creatinine 1.13.  Bilateral hydronephrosis on KUB initially on Ditropan that was discontinued.  Renal ultrasound follow-up showed no hydronephrosis.  Bouts of constipation resolved with laxative assistance.  Mood stabilization maintained on Zoloft with emotional support provided    PAIN:  Are you having pain? No  PRECAUTIONS: Fall  WEIGHT BEARING RESTRICTIONS: No  FALLS: Has patient fallen in last 6 months? Yes. Number of falls 1;  happened about 10 days before patient had the stroke; patient tripped over family member, she fell on concrete and hit her head  LIVING ENVIRONMENT: Lives with: lives alone Lives in: House/apartment Stairs: Yes: External: 3 steps; on right going up Has following equipment at home: Single point cane, Walker - 2 wheeled, Wheelchair (manual), Shower bench, bed side commode, and Grab bars  PLOF: Independent  PATIENT GOALS: "Learning to walk by myself." (Referring to walking without walker)  OBJECTIVE:   Vitals:  02/23/22 1153  BP: (!) 128/53  Pulse: (!) 59    TODAY'S TREATMENT:                NMR: Blaze pod tapping fwd/retro walking between sets to gumdrop 3 x 60" with 30" recovery with SBQC (CGA, minA x 1) Blaze pod tapping fwd/retro walking between sets to gumdrop 3 x 60" with gumdrop elevated on 4" step and some blaze pods elevated to 6" step with 30" high knees as active recovery with SBQC (CGA, minA x 2) 4" step taps standing on airex pad with use of SBQC to step on/off but without AD during steps 2 x 5 (CGA-minA with fatigue)  Therex: Standing in 2WW with UE support kickout taps to tennis balls for hip abduction and modifies SLS stability on stance leg 2 x 15-20 reps   PATIENT EDUCATION: Education details: Continue HEP. Recommending continued use of 2WW at this time Person educated: Patient Education method: Explanation Education comprehension: verbalized understanding and needs further education  HOME EXERCISE PROGRAM: Recommended slowly increasing ambulation with supervision in setting with places for seated breaks as needed  Access Code: [email or web address] URL: [email or web address] Date: 02/02/2022 Prepared by: Malachi Carl  Exercises - Corner Balance Feet Together With Eyes Open  - 1 x daily - 7 x weekly - 3 sets - 30 hold - Corner Balance Feet Together With Eyes Closed  - 1 x daily - 7 x weekly - 3 sets - 30 hold - Standing March with Counter Support  - 1 x  daily - 7 x weekly - 3 sets - 10 reps - Sit to Stand with Armchair  - 1 x daily - 7 x weekly - 3 sets - 5 reps - Standing Tandem Balance with Counter Support  - 1 x daily - 7 x weekly - 3 sets - 30 hold - Supine Bridge  - 1 x daily - 7 x weekly - 2 sets - 10 reps - Supine Active Straight Leg Raise  - 1 x daily - 7 x weekly - 3 sets - 10 reps  GOALS: Goals reviewed with patient? Yes  SHORT TERM GOALS: Target date: 02/08/2022  Patient will demonstrate 100% compliance with initial HEP to continue to progress between physical therapy sessions.   Baseline: To be provided; Provided; 80% compliance on 02/09/2022; Patient continues to report questions about exercises and requires reinforcement on 02/16/2022 Goal status: IN PROGRESS  2.  Patient will improve TUG score by 3" seconds with LRAD to indicate clinically significant progress towards a decreased risk of falls and improved mobility.  Baseline: 26.87" with FWW and 26.84" without walker unsteady; 20.2" with FWW + SBA; 21.46" w/o AD + CGA; 15.42" and 15.43" with FWW + SBA/16.26", 15.47", 14.47" (x1 wobble) with the [facility] + CGA on 02/16/2022 Goal status: MET  3.  Patient will improve ABC Score to 34% or greater to indicate a decreased risk for falls and improved self-reported confidence in balance and sense of steadiness.   Baseline: 20%; 02/09/2022 to 76% Goal status: MET  4.  Patient will improve gait speed to 0.8 m/s with LRAD to indicate improvement to the level of community ambulator in order to participate more easily in activities outside of the home.    Baseline: 0.48 m/s; improved to 0.65 m/s on 02/09/2022; 02/06/2022 improved to 13.4" (0.74 m/s) with FWW + SBA, 17.03" with SBQC + CGA (0.59 m/s) Goal status: IN PROGRESS  LONG TERM GOALS: Target date: 03/08/2022   Patient will report  demonstrate independence with final HEP in order to maintain current gains and continue to progress after physical therapy discharge.   Baseline: To be  provided Goal status: INITIAL  2.  Patient will improve TUG score to 13.5" or less with LRAD to indicate a decreased risk of falls and demonstrate improved overall mobility.   Baseline: 26.87" with FWW and 26.84" without walker unsteady; 20.2" with FWW + SBA; 21.46" w/o AD + CGA; 15.42" and 15.43" with FWW + SBA/16.26", 15.47", 14.47" (x1 wobble) with the [facility] + CGA on 02/16/2022 Goal status: IN PROGRESS  3.  Patient will improve ABC Score to 67% or greater to indicate a decreased risk for falls and improved self-reported confidence in balance and sense of steadiness.   Baseline: 20%; 02/09/2022 to 76% Goal status: MET  4.  Patient will improve gait speed to 0.85 m/s or greater to indicate a reduced risk for falls.   Baseline: 0.48 m/s; improved to 0.65 m/s on 02/09/2022; improved to 0.65 m/s on 02/09/2022; 02/06/2022 improved to 13.4" (0.74 m/s) with FWW + SBA, 17.03" with SBQC + CGA (0.59 m/s) Goal status: REVISED  5.  Patient will demonstrate ability to come to stand from chair without use of UE to demonstrate improve LE strength.  Baseline: requires bilateral use of UE; sit to stand without UE support x 1 on 02/09/2022 Goal status: MET  ASSESSMENT:  CLINICAL IMPRESSION: Pt seen for skilled PT session today focused on safe mobility using rubber tip quad cane progressions, modified SLS stability, and endurance. Patient fatigued quickly during stance of foam with taps and required increased assistance to maintain stability. Ended session with hip abduction strengthening in order to continue to improve stance leg strength to progress balance. Patient reported early fatigue with this exercise and will benefit from continued strengthening of this muscle group. Will continue POC.  OBJECTIVE IMPAIRMENTS: Abnormal gait, decreased activity tolerance, decreased balance, decreased coordination, decreased mobility, difficulty walking, and decreased strength.   ACTIVITY LIMITATIONS: standing, squatting,  stairs, and transfers  PARTICIPATION LIMITATIONS: driving, shopping, and community activity  PERSONAL FACTORS: Age, Past/current experiences, and Transportation are also affecting patient's functional outcome.   REHAB POTENTIAL: Good  CLINICAL DECISION MAKING: Evolving/moderate complexity  EVALUATION COMPLEXITY: Moderate  PLAN:  PT FREQUENCY: 2x/week  PT DURATION: 8 weeks  PLANNED INTERVENTIONS: Therapeutic exercises, Therapeutic activity, Neuromuscular re-education, Balance training, Gait training, Patient/Family education, and Self Care  PLAN FOR NEXT SESSION: SBQC use in session with functional tasks-especially curb/ramp/compliant surface (maybe blaze pods and obstacle course?), SLS for strength and balance-toe taps varying heights (try gumdrops?), She might do well with treadmill interval training?; ball rolls with UE support to promote leg stance stregthening  Maryruth Eve, PT, DPT 02/23/2022, 12:48 PM

## 2022-02-28 ENCOUNTER — Ambulatory Visit: Payer: Medicare (Managed Care) | Admitting: Physical Therapy

## 2022-02-28 VITALS — BP 146/62 | HR 54

## 2022-02-28 DIAGNOSIS — R278 Other lack of coordination: Secondary | ICD-10-CM

## 2022-02-28 DIAGNOSIS — R2681 Unsteadiness on feet: Secondary | ICD-10-CM | POA: Diagnosis not present

## 2022-02-28 DIAGNOSIS — M6281 Muscle weakness (generalized): Secondary | ICD-10-CM

## 2022-02-28 NOTE — Therapy (Signed)
OUTPATIENT PHYSICAL THERAPY NEURO TREATMENT   Patient Name: [name] MRN: [id number] DOB:November 13, 1934, 88 y.o., female Today's Date: 02/28/2022  PCP: Blair Heys REFERRING PROVIDER: Jacquelynn Cree, PA-C  END OF SESSION:  PT End of Session - 02/28/22 1232     Visit Number 13    Number of Visits 17    Date for PT Re-Evaluation 03/08/22    Authorization Type [employer]vantage    Progress Note Due on Visit 20    PT Start Time 1231    Equipment Utilized During Treatment Gait belt    Activity Tolerance Patient tolerated treatment well    Behavior During Therapy WFL for tasks assessed/performed                Past Medical History:  Diagnosis Date   Arthritis    Atrial fibrillation (HCC)    Carotid artery occlusion    Colitis    COPD (chronic obstructive pulmonary disease) (HCC)    Diverticulitis    Hypertension    Keratosis, seborrheic    Lumbar spondylosis    w/impingement L3-S1   Past Surgical History:  Procedure Laterality Date   ABDOMINAL HYSTERECTOMY     BREAST EXCISIONAL BIOPSY Left    BREAST EXCISIONAL BIOPSY Left    BREAST EXCISIONAL BIOPSY Right    BREAST SURGERY     cyst removal   CAROTID ENDARTERECTOMY Left 2010   CHOLECYSTECTOMY     EYE SURGERY Bilateral 06/2014   cataracts   JOINT REPLACEMENT     RIGHT KNEE   REPLACEMENT TOTAL KNEE Right    Patient Active Problem List   Diagnosis Date Noted   Cerebellar stroke (HCC) 12/20/2021   Acute ischemic stroke (HCC) 12/16/2021   Vertigo 12/14/2021   CVA (cerebral vascular accident) (HCC) 12/14/2021   Nausea and vomiting 12/14/2021   Hypokalemia 12/14/2021   Leukocytosis 12/14/2021   Essential hypertension 12/14/2021   Anxiety 12/14/2021   Hyperlipidemia 12/14/2021   Carotid artery disease (HCC) 12/14/2021   Osteoarthritis cervical spine 09/21/2020   Compression fracture of T12 vertebra (HCC) 04/16/2018   Paroxysmal atrial fibrillation (HCC) 03/03/2018   SBO (small bowel obstruction)  (HCC) 01/26/2017   Encounter for postoperative carotid endarterectomy surveillance 09/21/2015   Occlusion and stenosis of carotid artery without mention of cerebral infarction 02/05/2012    ONSET DATE: 01/01/2022 (referral date)  REFERRING DIAG:  I63.9 (ICD-10-CM) - Cerebral infarction, unspecified    THERAPY DIAG:  Unsteadiness on feet  Muscle weakness (generalized)  Other lack of coordination  Rationale for Evaluation and Treatment: Rehabilitation  SUBJECTIVE:  SUBJECTIVE STATEMENT: PT orients to patient's left side throughout session as she hears better out of left ear.   Patient reports she "has been better and has been worse". Has a new pain under her R shoulder blade, started night prior. No falls, exercises are going well.   Pt accompanied by: friend - Fannie Knee  PERTINENT HISTORY:   Taken from 01/01/2022 Note:  Brief HPI:   [name] is a 88 y.o. right-handed female with history of hypertension, PAF, left CEA, COPD, lumbar spondylosis, SBO.  Admitted 12/14/2021 with reports of dizziness and nausea with emesis and inability to get out of bed.  She also reported a fall a week prior to admission and then struck her head without any significant injuries.  She was noted to have atrial fibrillation with elevated blood pressure and potassium 2.8.  CTA head and neck negative for LVO and showed complex plaque left ICA origin with 60% stenosis and evidence of right ICA fibromuscular dysplasia.  MRI of the brain showed normal brain for age and no acute abnormality.  Scan reviewed by neurology services felt that patient with small cerebellar infarct not seen on films likely embolic due to PAF.  She was started on Eliquis as patient agreeable.  Echo with ejection fraction of 55 to 60%.  KUB showed moderate  marked and moderate left hydronephrosis with markedly distended bladder concerning for obstruction with a Foley tube recommended.  Also reported to have horizontal nystagmus with dizziness and movement due to vestibular dysfunction.  On 11/26 she developed A-fib with RVR rates of 170 treated with IV metoprolol.  Therapy evaluations completed due to patient decreased functional mobility was admitted for a comprehensive rehab program.     Hospital Course: [name] was admitted to rehab 12/20/2021 for inpatient therapies to consist of PT, ST and OT at least three hours five days a week. Past admission physiatrist, therapy team and rehab RN have worked together to provide customized collaborative inpatient rehab.  Pertaining to patient's right cerebellar CVA remained stable she remained on Eliquis therapy and would follow-up with neurology services.  Bilateral lower extremity neuropathy with the addition of gabapentin chronic back pain had injections in the past in [place].  X-rays lumbar showed severe DDD spondylosis.  Atrial fibrillation controlled remained on Tenormin as well as Eliquis.  Blood pressure soft and monitored will need outpatient follow-up.  AKI improved with latest creatinine 1.13.  Bilateral hydronephrosis on KUB initially on Ditropan that was discontinued.  Renal ultrasound follow-up showed no hydronephrosis.  Bouts of constipation resolved with laxative assistance.  Mood stabilization maintained on Zoloft with emotional support provided    PAIN:  Are you having pain? Yes: NPRS scale: 3-4/10 Pain location: Inferior R shoulder blade Pain description: Achy/sharp  PRECAUTIONS: Fall  WEIGHT BEARING RESTRICTIONS: No  FALLS: Has patient fallen in last 6 months? Yes. Number of falls 1; happened about 10 days before patient had the stroke; patient tripped over family member, she fell on concrete and hit her head  LIVING ENVIRONMENT: Lives with: lives alone Lives in:  House/apartment Stairs: Yes: External: 3 steps; on right going up Has following equipment at home: Single point cane, Walker - 2 wheeled, Wheelchair (manual), Shower bench, bed side commode, and Grab bars  PLOF: Independent  PATIENT GOALS: "Learning to walk by myself." (Referring to walking without walker)  OBJECTIVE:   Vitals:   02/28/22 1247 02/28/22 1249  BP: (!) 162/70 (!) 146/62  Pulse: 60 (!) 54  TODAY'S TREATMENT:               Gait Training  Gait pattern: step through pattern, decreased step length- Right, decreased step length- Left, decreased stride length, decreased hip/knee flexion- Right, decreased hip/knee flexion- Left, decreased ankle dorsiflexion- Right, decreased ankle dorsiflexion- Left, lateral lean- Right, decreased trunk rotation, trunk flexed, narrow BOS, poor foot clearance- Right, and poor foot clearance- Left Distance walked: 200' Assistive device utilized:  SPC w/quad tip Level of assistance: Min A Comments: Pt frequently running into obstacles, on R side> L side. Mod cues to scan environment and pt tends to stare down at ground. Frequent LOB to L side requiring min A to stabilize.   Performed obstacle course using yardstick, weighted dowel and 4" foam beam w/quad tip SPC and min A for practice stepping over obstacles to prime for curb training. Pt required min cues for proper sequencing of cane and to get closer to obstacle prior to attempting to step over it for improved safety and posture.    CURB:  Level of Assistance: CGA Assistive device utilized:  SPC w/quad tip  Curb Comments: Min cues provided to get closer to curb prior to attempting to step up and for proper sequencing of cane. No instability noted  Ther Ex  In // bars for improved step clearance, LE coordination and BLE strength: fwd and lateral 4" hurdle navigation with LUE support only, x2 each direction. Pt continued to require cues to maintain safe distance to obstacle prior to  attempting to step over it, but w/cues performed well. No LOB noted.  SciFit multi-peaks level 2 for 8 minutes using BUE/BLEs for neural priming for reciprocal movement, dynamic cardiovascular conditioning and global strengthening. Pt required short seated break at 2.5 minutes, RPE of 7/10 following activity     PATIENT EDUCATION: Education details: Continue HEP. Recommending continued use of 2WW at this time Person educated: Patient Education method: Explanation Education comprehension: verbalized understanding and needs further education  HOME EXERCISE PROGRAM: Recommended slowly increasing ambulation with supervision in setting with places for seated breaks as needed  Access Code: [email or web address] URL: [email or web address] Date: 02/02/2022 Prepared by: Malachi Carl  Exercises - Corner Balance Feet Together With Eyes Open  - 1 x daily - 7 x weekly - 3 sets - 30 hold - Corner Balance Feet Together With Eyes Closed  - 1 x daily - 7 x weekly - 3 sets - 30 hold - Standing March with Counter Support  - 1 x daily - 7 x weekly - 3 sets - 10 reps - Sit to Stand with Armchair  - 1 x daily - 7 x weekly - 3 sets - 5 reps - Standing Tandem Balance with Counter Support  - 1 x daily - 7 x weekly - 3 sets - 30 hold - Supine Bridge  - 1 x daily - 7 x weekly - 2 sets - 10 reps - Supine Active Straight Leg Raise  - 1 x daily - 7 x weekly - 3 sets - 10 reps  GOALS: Goals reviewed with patient? Yes  SHORT TERM GOALS: Target date: 02/08/2022  Patient will demonstrate 100% compliance with initial HEP to continue to progress between physical therapy sessions.   Baseline: To be provided; Provided; 80% compliance on 02/09/2022; Patient continues to report questions about exercises and requires reinforcement on 02/16/2022 Goal status: IN PROGRESS  2.  Patient will improve TUG score by 3" seconds with LRAD to indicate clinically significant progress towards a decreased  risk of falls and improved  mobility.  Baseline: 26.87" with FWW and 26.84" without walker unsteady; 20.2" with FWW + SBA; 21.46" w/o AD + CGA; 15.42" and 15.43" with FWW + SBA/16.26", 15.47", 14.47" (x1 wobble) with the [facility] + CGA on 02/16/2022 Goal status: MET  3.  Patient will improve ABC Score to 34% or greater to indicate a decreased risk for falls and improved self-reported confidence in balance and sense of steadiness.   Baseline: 20%; 02/09/2022 to 76% Goal status: MET  4.  Patient will improve gait speed to 0.8 m/s with LRAD to indicate improvement to the level of community ambulator in order to participate more easily in activities outside of the home.    Baseline: 0.48 m/s; improved to 0.65 m/s on 02/09/2022; 02/06/2022 improved to 13.4" (0.74 m/s) with FWW + SBA, 17.03" with SBQC + CGA (0.59 m/s) Goal status: IN PROGRESS  LONG TERM GOALS: Target date: 03/08/2022   Patient will report demonstrate independence with final HEP in order to maintain current gains and continue to progress after physical therapy discharge.   Baseline: To be provided Goal status: INITIAL  2.  Patient will improve TUG score to 13.5" or less with LRAD to indicate a decreased risk of falls and demonstrate improved overall mobility.   Baseline: 26.87" with FWW and 26.84" without walker unsteady; 20.2" with FWW + SBA; 21.46" w/o AD + CGA; 15.42" and 15.43" with FWW + SBA/16.26", 15.47", 14.47" (x1 wobble) with the [facility] + CGA on 02/16/2022 Goal status: IN PROGRESS  3.  Patient will improve ABC Score to 67% or greater to indicate a decreased risk for falls and improved self-reported confidence in balance and sense of steadiness.   Baseline: 20%; 02/09/2022 to 76% Goal status: MET  4.  Patient will improve gait speed to 0.85 m/s or greater to indicate a reduced risk for falls.   Baseline: 0.48 m/s; improved to 0.65 m/s on 02/09/2022; improved to 0.65 m/s on 02/09/2022; 02/06/2022 improved to 13.4" (0.74 m/s) with FWW + SBA, 17.03" with SBQC + CGA  (0.59 m/s) Goal status: REVISED  5.  Patient will demonstrate ability to come to stand from chair without use of UE to demonstrate improve LE strength.  Baseline: requires bilateral use of UE; sit to stand without UE support x 1 on 02/09/2022 Goal status: MET  ASSESSMENT:  CLINICAL IMPRESSION: Emphasis of skilled PT session on gait training w/cane, obstacle navigation and endurance. Therapist continues to recommend use of RW at all times at home due to safety concerns w/cane, pt and niece verbalized understanding. Pt's BP more elevated today and required frequent rest breaks throughout session to allow BP to return to baseline. Pt has tendency to look down at feet w/gait and run into obstacles around her, requiring min cues to attend to surroundings. Continue POC.   OBJECTIVE IMPAIRMENTS: Abnormal gait, decreased activity tolerance, decreased balance, decreased coordination, decreased mobility, difficulty walking, and decreased strength.   ACTIVITY LIMITATIONS: standing, squatting, stairs, and transfers  PARTICIPATION LIMITATIONS: driving, shopping, and community activity  PERSONAL FACTORS: Age, Past/current experiences, and Transportation are also affecting patient's functional outcome.   REHAB POTENTIAL: Good  CLINICAL DECISION MAKING: Evolving/moderate complexity  EVALUATION COMPLEXITY: Moderate  PLAN:  PT FREQUENCY: 2x/week  PT DURATION: 8 weeks  PLANNED INTERVENTIONS: Therapeutic exercises, Therapeutic activity, Neuromuscular re-education, Balance training, Gait training, Patient/Family education, and Self Care  PLAN FOR NEXT SESSION: SBQC use in session with functional tasks-especially curb/ramp/compliant surface (maybe blaze pods and obstacle course?),  SLS for strength and balance-toe taps varying heights (try gumdrops?), She might do well with treadmill interval training?; ball rolls with UE support to promote leg stance stregthening  Cruzita Lederer Chloeann Alfred, PT,  [place] [address] [place], [place]  [address] Phone:  [phone] Fax:  [phone] 02/28/2022, 12:51 PM

## 2022-03-02 ENCOUNTER — Ambulatory Visit: Payer: Medicare (Managed Care) | Admitting: Physical Therapy

## 2022-03-07 ENCOUNTER — Ambulatory Visit: Payer: Medicare (Managed Care) | Admitting: Physical Therapy

## 2022-03-07 ENCOUNTER — Encounter: Payer: Self-pay | Admitting: Physical Therapy

## 2022-03-07 DIAGNOSIS — R269 Unspecified abnormalities of gait and mobility: Secondary | ICD-10-CM

## 2022-03-07 DIAGNOSIS — R278 Other lack of coordination: Secondary | ICD-10-CM

## 2022-03-07 DIAGNOSIS — R2681 Unsteadiness on feet: Secondary | ICD-10-CM

## 2022-03-07 DIAGNOSIS — R293 Abnormal posture: Secondary | ICD-10-CM

## 2022-03-07 DIAGNOSIS — M6281 Muscle weakness (generalized): Secondary | ICD-10-CM

## 2022-03-07 NOTE — Therapy (Signed)
OUTPATIENT PHYSICAL THERAPY NEURO TREATMENT/DISCHARGE SUMMARY   Patient Name: [name] MRN: [id number] DOB:September 06, 1934, 88 y.o., female Today's Date: 03/07/2022  PCP: Gaynelle Arabian REFERRING PROVIDER: Bary Leriche, PA-C  PHYSICAL THERAPY DISCHARGE SUMMARY  Visits from Start of Care: 14  Current functional level related to goals / functional outcomes: See clinical impression statement.   Remaining deficits: [place]iance on 2WW for safety, chronic postural changes, intermittent shoulder blade discomfort   Education / Equipment: Continue using 2WW for safety, continue HEP and walking program, modifications to HEP, and discharge plan for this visit.   Patient agrees to discharge. Patient goals were partially met. Patient is being discharged due to maximized rehab potential.   END OF SESSION:  PT End of Session - 03/07/22 1627     Visit Number 14    Number of Visits 17    Date for PT Re-Evaluation 03/08/22    Authorization Type [employer] Advantage    Progress Note Due on Visit 53    PT Start Time 1623    PT Stop Time 1700    PT Time Calculation (min) 37 min    Equipment Utilized During Treatment Gait belt    Activity Tolerance Patient tolerated treatment well    Behavior During Therapy WFL for tasks assessed/performed                Past Medical History:  Diagnosis Date   Arthritis    Atrial fibrillation (HCC)    Carotid artery occlusion    Colitis    COPD (chronic obstructive pulmonary disease) (HCC)    Diverticulitis    Hypertension    Keratosis, seborrheic    Lumbar spondylosis    w/impingement L3-S1   Past Surgical History:  Procedure Laterality Date   ABDOMINAL HYSTERECTOMY     BREAST EXCISIONAL BIOPSY Left    BREAST EXCISIONAL BIOPSY Left    BREAST EXCISIONAL BIOPSY Right    BREAST SURGERY     cyst removal   CAROTID ENDARTERECTOMY Left 2010   CHOLECYSTECTOMY     EYE SURGERY Bilateral 06/2014   cataracts   JOINT REPLACEMENT     RIGHT  KNEE   REPLACEMENT TOTAL KNEE Right    Patient Active Problem List   Diagnosis Date Noted   Cerebellar stroke ([place]) 12/20/2021   Acute ischemic stroke ([place]) 12/16/2021   Vertigo 12/14/2021   CVA (cerebral vascular accident) ([place]) 12/14/2021   Nausea and vomiting 12/14/2021   Hypokalemia 12/14/2021   Leukocytosis 12/14/2021   Essential hypertension 12/14/2021   Anxiety 12/14/2021   Hyperlipidemia 12/14/2021   Carotid artery disease ([place]) 12/14/2021   Osteoarthritis cervical spine 09/21/2020   Compression fracture of T12 vertebra ([place]) 04/16/2018   Paroxysmal atrial fibrillation ([place]) 03/03/2018   SBO (small bowel obstruction) ([place]) 01/26/2017   Encounter for postoperative carotid endarterectomy surveillance 09/21/2015   Occlusion and stenosis of carotid artery without mention of cerebral infarction 02/05/2012    ONSET DATE: 01/01/2022 (referral date)  REFERRING DIAG:  I63.9 (ICD-10-CM) - Cerebral infarction, unspecified    THERAPY DIAG:  Unsteadiness on feet  Muscle weakness (generalized)  Other lack of coordination  Abnormality of gait and mobility  Abnormal posture  Rationale for Evaluation and Treatment: Rehabilitation  SUBJECTIVE:  SUBJECTIVE STATEMENT: PT orients to patient's left side throughout session as she hears better out of left ear.   Pt states today is one of those days where she just has the "blahs".  She continues to endorse the R shoulder blade discomfort described as a "catch".  No falls, exercises are going well and remain challenging.   Pt accompanied by: family member - Manuela Schwartz (niece)  PERTINENT HISTORY:   Taken from 01/01/2022 Note:  Brief HPI:   [name] is a 88 y.o. right-handed female with history of hypertension, PAF, left CEA, COPD, lumbar  spondylosis, SBO.  Admitted 12/14/2021 with reports of dizziness and nausea with emesis and inability to get out of bed.  She also reported a fall a week prior to admission and then struck her head without any significant injuries.  She was noted to have atrial fibrillation with elevated blood pressure and potassium 2.8.  CTA head and neck negative for LVO and showed complex plaque left ICA origin with 60% stenosis and evidence of right ICA fibromuscular dysplasia.  MRI of the brain showed normal brain for age and no acute abnormality.  Scan reviewed by neurology services felt that patient with small cerebellar infarct not seen on films likely embolic due to PAF.  She was started on Eliquis as patient agreeable.  Echo with ejection fraction of 55 to 60%.  KUB showed moderate marked and moderate left hydronephrosis with markedly distended bladder concerning for obstruction with a Foley tube recommended.  Also reported to have horizontal nystagmus with dizziness and movement due to vestibular dysfunction.  On 11/26 she developed A-fib with RVR rates of 170 treated with IV metoprolol.  Therapy evaluations completed due to patient decreased functional mobility was admitted for a comprehensive rehab program.     Hospital Course: [name] was admitted to rehab 12/20/2021 for inpatient therapies to consist of PT, ST and OT at least three hours five days a week. Past admission physiatrist, therapy team and rehab RN have worked together to provide customized collaborative inpatient rehab.  Pertaining to patient's right cerebellar CVA remained stable she remained on Eliquis therapy and would follow-up with neurology services.  Bilateral lower extremity neuropathy with the addition of gabapentin chronic back pain had injections in the past in [place].  X-rays lumbar showed severe DDD spondylosis.  Atrial fibrillation controlled remained on Tenormin as well as Eliquis.  Blood pressure soft and monitored will need  outpatient follow-up.  AKI improved with latest creatinine 1.13.  Bilateral hydronephrosis on KUB initially on Ditropan that was discontinued.  Renal ultrasound follow-up showed no hydronephrosis.  Bouts of constipation resolved with laxative assistance.  Mood stabilization maintained on Zoloft with emotional support provided    PAIN:  Are you having pain? Yes: NPRS scale: 0/10 Pain location: Inferior R shoulder blade Pain description: Achy/sharp -intermittent only w/ certain random movements  PRECAUTIONS: Fall  WEIGHT BEARING RESTRICTIONS: No  FALLS: Has patient fallen in last 6 months? Yes. Number of falls 1; happened about 10 days before patient had the stroke; patient tripped over family member, she fell on concrete and hit her head  LIVING ENVIRONMENT: Lives with: lives alone Lives in: House/apartment Stairs: Yes: External: 3 steps; on right going up Has following equipment at home: Single point cane, Walker - 2 wheeled, Wheelchair (manual), Shower bench, bed side commode, and Grab bars  PLOF: Independent  PATIENT GOALS: "Learning to walk by myself." (Referring to walking without walker)  OBJECTIVE:   There were no  vitals filed for this visit.    TODAY'S TREATMENT:               TherAct: -ABC Scale:  73.125% -TUG w/ RW:  13.06 seconds -10MWT w/ RW (top speed): 10.03 seconds = 1.00 m/sec OR 3.29 ft/sec -10MWT w/ RW (normal speed):  13.66 seconds = 0.73 m/sec OR 2.42 ft/sec -Pt performs STS independently w/o UE support -Verbally reviewed and reprinted HEP, discussed modifications to STS to add safe challenge w/ SBA (lowered surface), and removed bridge as pt reports it is too easy.  Left SLR because pt enjoys this. -Provided tennis balls to improve safety and fluidity of gait using RW.  PATIENT EDUCATION: Education details: Continue HEP. Recommending continued use of 2WW at this time. Person educated: Patient Education method: Explanation Education comprehension:  verbalized understanding and needs further education  HOME EXERCISE PROGRAM: Recommended slowly increasing ambulation with supervision in setting with places for seated breaks as needed  Access Code: [email or web address] URL: [email or web address] Date: 03/07/2022 Prepared by: [place] Together With Eyes Open  - 1 x daily - 7 x weekly - 3 sets - 30 hold - Corner Balance Feet Together With Eyes Closed  - 1 x daily - 7 x weekly - 3 sets - 30 hold - Standing March with Counter Support  - 1 x daily - 7 x weekly - 3 sets - 10 reps - Sit to Stand with Armchair  - 1 x daily - 7 x weekly - 3 sets - 5 reps - Standing Tandem Balance with Counter Support  - 1 x daily - 7 x weekly - 3 sets - 30 hold - Supine Active Straight Leg Raise  - 1 x daily - 7 x weekly - 3 sets - 10 reps  GOALS: Goals reviewed with patient? Yes  SHORT TERM GOALS: Target date: 02/08/2022  Patient will demonstrate 100% compliance with initial HEP to continue to progress between physical therapy sessions.   Baseline: To be provided; Provided; 80% compliance on 02/09/2022; Patient continues to report questions about exercises and requires reinforcement on 02/16/2022 Goal status: IN PROGRESS  2.  Patient will improve TUG score by 3" seconds with LRAD to indicate clinically significant progress towards a decreased risk of falls and improved mobility.  Baseline: 26.87" with FWW and 26.84" without walker unsteady; 20.2" with FWW + SBA; 21.46" w/o AD + CGA; 15.42" and 15.43" with FWW + SBA/16.26", 15.47", 14.47" (x1 wobble) with the [facility] + CGA on 02/16/2022 Goal status: MET  3.  Patient will improve ABC Score to 34% or greater to indicate a decreased risk for falls and improved self-reported confidence in balance and sense of steadiness.   Baseline: 20%; 02/09/2022 to 76% Goal status: MET  4.  Patient will improve gait speed to 0.8 m/s with LRAD to indicate improvement to the level of  community ambulator in order to participate more easily in activities outside of the home.    Baseline: 0.48 m/s; improved to 0.65 m/s on 02/09/2022; 02/06/2022 improved to 13.4" (0.74 m/s) with FWW + SBA, 17.03" with SB[facility] + CGA (0.59 m/s) Goal status: IN PROGRESS  LONG TERM GOALS: Target date: 03/08/2022   Patient will report demonstrate independence with final HEP in order to maintain current gains and continue to progress after physical therapy discharge.   Baseline: Established, reviewed, and pt compliant (2/14) Goal status: MET  2.  Patient will improve TUG score to 13.5" or less with LRAD to  indicate a decreased risk of falls and demonstrate improved overall mobility.   Baseline: 26.87" with FWW and 26.84" without walker unsteady; 20.2" with FWW + SBA; 21.46" w/o AD + CGA; 15.42" and 15.43" with FWW + SBA/16.26", 15.47", 14.47" (x1 wobble) with the SB[facility] + CGA on 02/16/2022; 13.06 seconds w/ RW (2/14) Goal status: MET  3.  Patient will improve ABC Score to 67% or greater to indicate a decreased risk for falls and improved self-reported confidence in balance and sense of steadiness.   Baseline: 20%; 02/09/2022 to 76%; 73% (2/14) Goal status: MET  4.  Patient will improve gait speed to 0.85 m/s or greater to indicate a reduced risk for falls.   Baseline: 0.48 m/s; improved to 0.65 m/s on 02/09/2022; improved to 0.65 m/s on 02/09/2022; 02/06/2022 improved to 13.4" (0.74 m/s) with FWW + SBA, 17.03" with SBQC + CGA (0.59 m/s); 0.73 m/sec (2/14) Goal status: NOT MET  5.  Patient will demonstrate ability to come to stand from chair without use of UE to demonstrate improve LE strength.  Baseline: requires bilateral use of UE; sit to stand without UE support x 1 on 02/09/2022; pt able to complete w/o UE support independently (2/14) Goal status: MET  ASSESSMENT:  CLINICAL IMPRESSION: Reviewed HEP w/ only mild modifications this session in order for pt to maintain focus on most challenging aspects.   She maintained a similar ABC scale score to assessment prior rating her confidence in her balance at 73% vs very first score of 20%.  Her TUG time was 13.06 seconds w/ using of a RW and her gait speed, though improved to 0.73 m/sec, fell just short of goal level.  She remains independent in standing transfers.  At this time, she is appropriate for and in agreement to discharge to home management.   OBJECTIVE IMPAIRMENTS: Abnormal gait, decreased activity tolerance, decreased balance, decreased coordination, decreased mobility, difficulty walking, and decreased strength.   ACTIVITY LIMITATIONS: standing, squatting, stairs, and transfers  PARTICIPATION LIMITATIONS: driving, shopping, and community activity  PERSONAL FACTORS: Age, Past/current experiences, and Transportation are also affecting patient's functional outcome.   REHAB POTENTIAL: Good  CLINICAL DECISION MAKING: Evolving/moderate complexity  EVALUATION COMPLEXITY: Moderate  PLAN:  PT FREQUENCY: 2x/week  PT DURATION: 8 weeks  PLANNED INTERVENTIONS: Therapeutic exercises, Therapeutic activity, Neuromuscular re-education, Balance training, Gait training, Patient/Family education, and Self Care  PLAN FOR NEXT SESSION: N/A  Elease Etienne, PT, [facility] [address] [place], [place]  [address] Phone:  [phone] Fax:  [phone] 03/07/2022, 5:44 PM

## 2022-03-08 ENCOUNTER — Telehealth: Payer: Self-pay | Admitting: Physical Therapy

## 2022-03-08 NOTE — Telephone Encounter (Signed)
Called patient and discussed sending [facility] for discharge from physical therapy to patient to complete online. Patient verbalized agreement and email over phone. Therapist emailed survey.   Malachi Carl, PT, DPT

## 2022-03-09 ENCOUNTER — Ambulatory Visit: Payer: Medicare (Managed Care) | Admitting: Physical Therapy

## 2022-03-14 ENCOUNTER — Ambulatory Visit: Payer: Medicare (Managed Care) | Admitting: Physical Therapy

## 2022-03-16 ENCOUNTER — Ambulatory Visit: Payer: Medicare (Managed Care) | Admitting: Physical Therapy

## 2022-03-21 ENCOUNTER — Ambulatory Visit: Payer: Medicare (Managed Care) | Admitting: Physical Therapy

## 2022-03-23 ENCOUNTER — Ambulatory Visit: Payer: Medicare (Managed Care) | Admitting: Physical Therapy

## 2022-04-13 DIAGNOSIS — N183 Chronic kidney disease, stage 3 unspecified: Secondary | ICD-10-CM | POA: Diagnosis not present

## 2022-04-13 DIAGNOSIS — E781 Pure hyperglyceridemia: Secondary | ICD-10-CM | POA: Diagnosis not present

## 2022-04-13 DIAGNOSIS — K219 Gastro-esophageal reflux disease without esophagitis: Secondary | ICD-10-CM | POA: Diagnosis not present

## 2022-04-13 DIAGNOSIS — I10 Essential (primary) hypertension: Secondary | ICD-10-CM | POA: Diagnosis not present

## 2022-04-23 ENCOUNTER — Emergency Department (HOSPITAL_COMMUNITY)
Admission: EM | Admit: 2022-04-23 | Discharge: 2022-04-23 | Disposition: A | Discharge status: Home / Self Care | Payer: Medicare (Managed Care) | Attending: Emergency Medicine | Admitting: Emergency Medicine

## 2022-04-23 ENCOUNTER — Other Ambulatory Visit: Payer: Self-pay

## 2022-04-23 ENCOUNTER — Emergency Department (HOSPITAL_COMMUNITY): Payer: Medicare (Managed Care)

## 2022-04-23 DIAGNOSIS — Z743 Need for continuous supervision: Secondary | ICD-10-CM | POA: Diagnosis not present

## 2022-04-23 DIAGNOSIS — I2510 Atherosclerotic heart disease of native coronary artery without angina pectoris: Secondary | ICD-10-CM | POA: Insufficient documentation

## 2022-04-23 DIAGNOSIS — J449 Chronic obstructive pulmonary disease, unspecified: Secondary | ICD-10-CM | POA: Diagnosis not present

## 2022-04-23 DIAGNOSIS — Z79899 Other long term (current) drug therapy: Secondary | ICD-10-CM | POA: Insufficient documentation

## 2022-04-23 DIAGNOSIS — R0789 Other chest pain: Secondary | ICD-10-CM | POA: Insufficient documentation

## 2022-04-23 DIAGNOSIS — I10 Essential (primary) hypertension: Secondary | ICD-10-CM | POA: Diagnosis not present

## 2022-04-23 DIAGNOSIS — R110 Nausea: Secondary | ICD-10-CM | POA: Insufficient documentation

## 2022-04-23 DIAGNOSIS — R079 Chest pain, unspecified: Secondary | ICD-10-CM

## 2022-04-23 DIAGNOSIS — Z7901 Long term (current) use of anticoagulants: Secondary | ICD-10-CM | POA: Diagnosis not present

## 2022-04-23 DIAGNOSIS — R6889 Other general symptoms and signs: Secondary | ICD-10-CM | POA: Diagnosis not present

## 2022-04-23 DIAGNOSIS — R5383 Other fatigue: Secondary | ICD-10-CM | POA: Diagnosis not present

## 2022-04-23 LAB — TROPONIN I (HIGH SENSITIVITY)
Troponin I (High Sensitivity): 13 ng/L (ref <18)
Troponin I (High Sensitivity): 10 ng/L (ref <18)

## 2022-04-23 LAB — BASIC METABOLIC PANEL
Anion gap: 11 (ref 5–15)
BUN: 10 mg/dL (ref 8–23)
CO2: 26 mmol/L (ref 22–32)
Calcium: 9.1 mg/dL (ref 8.9–10.3)
Chloride: 99 mmol/L (ref 98–111)
Creatinine, Ser: 1.14 mg/dL — ABNORMAL HIGH (ref 0.44–1.00)
GFR, Estimated: 47 mL/min — ABNORMAL LOW (ref >60)
Glucose, Bld: 100 mg/dL — ABNORMAL HIGH (ref 70–99)
Potassium: 3.7 mmol/L (ref 3.5–5.1)
Sodium: 136 mmol/L (ref 135–145)

## 2022-04-23 LAB — HEPATIC FUNCTION PANEL
ALT: 46 U/L — ABNORMAL HIGH (ref 0–44)
AST: 100 U/L — ABNORMAL HIGH (ref 15–41)
Albumin: 3.7 g/dL (ref 3.5–5.0)
Alkaline Phosphatase: 90 U/L (ref 38–126)
Bilirubin, Direct: 0.2 mg/dL (ref 0.0–0.2)
Indirect Bilirubin: 0.3 mg/dL (ref 0.3–0.9)
Total Bilirubin: 0.5 mg/dL (ref 0.3–1.2)
Total Protein: 7.2 g/dL (ref 6.5–8.1)

## 2022-04-23 LAB — CBC
HCT: 41.9 % (ref 36.0–46.0)
Hemoglobin: 13.8 g/dL (ref 12.0–15.0)
MCH: 28.1 pg (ref 26.0–34.0)
MCHC: 32.9 g/dL (ref 30.0–36.0)
MCV: 85.3 fL (ref 80.0–100.0)
Platelets: 174 10*3/uL (ref 150–400)
RBC: 4.91 MIL/uL (ref 3.87–5.11)
RDW: 13.7 % (ref 11.5–15.5)
WBC: 4 10*3/uL (ref 4.0–10.5)
nRBC: 0 % (ref 0.0–0.2)

## 2022-04-23 LAB — MAGNESIUM: Magnesium: 1.7 mg/dL (ref 1.7–2.4)

## 2022-04-23 LAB — LIPASE, BLOOD: Lipase: 25 U/L (ref 11–51)

## 2022-04-23 NOTE — ED Triage Notes (Signed)
[facility] reports pt coming from home c/o not feeling well last night had some nausea. Today she woke up and had a moment of chest pain that went away immediately. No chest pain with EMS enroute.

## 2022-04-23 NOTE — Discharge Instructions (Signed)
Continue to take your omeprazole every day.  You should hear from the cardiology office to set up a follow-up appointment.  If you do not hear from them in the next couple days, call the number below.  Return to the emergency department at any time for any further symptoms of concern.

## 2022-04-23 NOTE — ED Provider Notes (Signed)
[facility] Provider Note   CSN: [id number] Arrival date & time: 04/23/22  0749     History  Chief Complaint  Patient presents with   Chest Pain    [name] is a 88 y.o. female.   Chest Pain Associated symptoms: abdominal pain   Patient presents for chest pain.  Medical history includes carotid artery stenosis, atrial fibrillation, arthritis, CVA, HTN, anxiety, HLD, CAD, COPD.  She is followed by [facility].  She is on Eliquis for atrial fibrillation.  She did not take her dose this morning. She experienced nausea last night.  She did not have any vomiting.  This morning, at approximately 3 AM, she went to let her dog out.  As she laid back in bed, she experienced some lower chest/epigastric pain.  Pain persisted for several hours.  It has since resolved.  Currently, she is asymptomatic.  She is prescribed omeprazole.     Home Medications Prior to Admission medications   Medication Sig Start Date End Date Taking? Authorizing Provider  acetaminophen (TYLENOL) 325 MG tablet Take 1-2 tablets (325-650 mg total) by mouth every 4 (four) hours as needed for mild pain. 12/26/21   Love, Ivan Anchors, PA-C  amLODipine (NORVASC) 10 MG tablet Take by mouth. 02/08/22   [provider]  apixaban (ELIQUIS) 5 MG TABS tablet Take 1 tablet (5 mg total) by mouth 2 (two) times daily. 12/29/21   Love, Ivan Anchors, PA-C  atenolol (TENORMIN) 25 MG tablet Take 0.5 tablets (12.5 mg total) by mouth daily with breakfast. 12/29/21   Love, Ivan Anchors, PA-C  diclofenac Sodium (VOLTAREN) 1 % GEL Apply 2 g topically 4 (four) times daily. 12/29/21   Love, Ivan Anchors, PA-C  gabapentin (NEURONTIN) 100 MG capsule Take 2 capsules (200 mg total) by mouth at bedtime. Patient not taking: Reported on 02/23/2022 12/29/21   Love, Ivan Anchors, PA-C  loratadine (CLARITIN) 10 MG tablet Take 10 mg by mouth daily.    [provider]  LORazepam (ATIVAN) 1 MG tablet Take 0.5 tablets (0.5 mg  total) by mouth at bedtime. Note medication was decreased and you should have additional pills at home. Patient taking differently: Take 0.5 mg by mouth at bedtime. Pt taking 1 tab in morning, .5 tab night 12/29/21   Love, Ivan Anchors, PA-C  losartan (COZAAR) 100 MG tablet Take 1 tablet (100 mg total) by mouth daily with breakfast. 12/29/21   Love, Ivan Anchors, PA-C  Multiple Vitamin (MULTIVITAMIN PO) Take 1 tablet by mouth daily.    [provider]  omeprazole (PRILOSEC) 40 MG capsule Take 1 capsule (40 mg total) by mouth daily. 12/29/21   Love, Ivan Anchors, PA-C  rosuvastatin (CRESTOR) 20 MG tablet Take 1 tablet (20 mg total) by mouth daily. 12/29/21   Love, Ivan Anchors, PA-C  sertraline (ZOLOFT) 100 MG tablet Take 1 tablet (100 mg total) by mouth at bedtime. 12/29/21   Love, Ivan Anchors, PA-C  sodium chloride (OCEAN) 0.65 % SOLN nasal spray Place 1 spray into both nostrils 2 (two) times daily.    [provider]      Allergies    Amoxicillin and Tetracyclines & related    Review of Systems   Review of Systems  Cardiovascular:  Positive for chest pain.  Gastrointestinal:  Positive for abdominal pain.  All other systems reviewed and are negative.   Physical Exam Updated Vital Signs BP (!) 147/58   Pulse (!) 52   Temp  98.2 F (36.8 C) (Oral)   Resp 17   SpO2 91%  Physical Exam Vitals and nursing note reviewed.  Constitutional:      General: She is not in acute distress.    Appearance: She is well-developed. She is not ill-appearing, toxic-appearing or diaphoretic.  HENT:     Head: Normocephalic and atraumatic.  Eyes:     Conjunctiva/sclera: Conjunctivae normal.  Neck:     Vascular: No JVD.  Cardiovascular:     Rate and Rhythm: Normal rate and regular rhythm.     Heart sounds: No murmur heard.    No friction rub.  Pulmonary:     Effort: Pulmonary effort is normal. No respiratory distress.     Breath sounds: No decreased breath sounds, wheezing, rhonchi or rales.  Chest:      Chest wall: No tenderness.  Abdominal:     Palpations: Abdomen is soft.     Tenderness: There is no abdominal tenderness.  Musculoskeletal:        General: No swelling.     Cervical back: Normal range of motion and neck supple.     Right lower leg: No edema.     Left lower leg: No edema.  Skin:    General: Skin is warm and dry.     Capillary Refill: Capillary refill takes less than 2 seconds.     Coloration: Skin is not cyanotic or pale.  Neurological:     General: No focal deficit present.     Mental Status: She is alert and oriented to person, place, and time.  Psychiatric:        Mood and Affect: Mood normal.        Behavior: Behavior normal.     ED Results / Procedures / Treatments   Labs (all labs ordered are listed, but only abnormal results are displayed) Labs Reviewed  BASIC METABOLIC PANEL - Abnormal; Notable for the following components:      Result Value   Glucose, Bld 100 (*)    Creatinine, Ser 1.14 (*)    GFR, Estimated 47 (*)    All other components within normal limits  HEPATIC FUNCTION PANEL - Abnormal; Notable for the following components:   AST 100 (*)    ALT 46 (*)    All other components within normal limits  CBC  LIPASE, BLOOD  MAGNESIUM  TROPONIN I (HIGH SENSITIVITY)  TROPONIN I (HIGH SENSITIVITY)    EKG EKG Interpretation  Date/Time:  Monday April 23 2022 08:01:06 EDT Ventricular Rate:  63 PR Interval:  143 QRS Duration: 89 QT Interval:  449 QTC Calculation: 460 R Axis:   61 Text Interpretation: Sinus rhythm Confirmed by Godfrey Pick [phone]) on 04/23/2022 9:19:07 AM  Radiology DG Chest 2 View  Result Date: 04/23/2022 CLINICAL DATA:  Chest pain EXAM: CHEST - 2 VIEW COMPARISON:  Chest x-ray dated December 14, 2021 FINDINGS: Cardiac and mediastinal contours within normal limits. Stable biapical pleural-parenchymal scarring. No pleural effusion or pneumothorax. IMPRESSION: No active cardiopulmonary disease. Electronically Signed   By: Yetta Glassman M.D.   On: 04/23/2022 08:51    Procedures Procedures    Medications Ordered in ED Medications - No data to display  ED Course/ Medical Decision Making/ A&P                             Medical Decision Making Amount and/or Complexity of Data Reviewed Labs: ordered. Radiology: ordered.   This  patient presents to the ED for concern of chest pain, this involves an extensive number of treatment options, and is a complaint that carries with it a high risk of complications and morbidity.  The differential diagnosis includes ACS, anxiety, gastritis, GERD, pancreatitis, costochondritis, pericarditis   Co morbidities that complicate the patient evaluation  carotid artery stenosis, atrial fibrillation, arthritis, CVA, HTN, anxiety, HLD, CAD, COPD   Additional history obtained:  Additional history obtained from N/A External records from outside source obtained and reviewed including EMR   Lab Tests:  I Ordered, and personally interpreted labs.  The pertinent results include: Normal electrolytes, normal hemoglobin, no leukocytosis.  Transaminases are mildly elevated.  Initial troponin is normal.   Imaging Studies ordered:  I ordered imaging studies including chest x-ray I independently visualized and interpreted imaging which showed no acute findings I agree with the radiologist interpretation   Cardiac Monitoring: / EKG:  The patient was maintained on a cardiac monitor.  I personally viewed and interpreted the cardiac monitored which showed an underlying rhythm of: Sinus rhythm  Problem List / ED Course / Critical interventions / Medication management  Patient presents for acute onset of lower chest/epigastric pain.  This occurred at 3 AM and has since resolved.  Currently she is asymptomatic.  She is well-appearing on exam.  Breathing is unlabored.  No rubs or murmurs are appreciated on cardiac auscultation.  Vital signs are reassuring.  EKG shows no concerning ST  segment abnormalities.  She is currently in a sinus rhythm.  Laboratory workup was initiated.  Patient's initial lab work is reassuring with normal troponin.  Patient remained asymptomatic while in the ED.  Per chart review, she is prescribed omeprazole.  She states that she does continue to take this daily and has taken it for years.  She was given water to drink.  Second troponin is reassuring.  Patient had no recurrence of symptoms during 5.5 hours of ED observation time.  Referral was sent for outpatient cardiology follow-up.  Patient was advised to continue taking her omeprazole.  She was discharged in good condition.   Social Determinants of Health:  Has access to outpatient care         Final Clinical Impression(s) / ED Diagnoses Final diagnoses:  Chest pain, unspecified type    Rx / DC Orders ED Discharge Orders          Ordered    Ambulatory referral to Cardiology       Comments: If you have not heard from the Cardiology office within the next 72 hours please call [phone].   04/23/22 1140              Godfrey Pick, MD 04/23/22 1329

## 2022-05-22 DIAGNOSIS — R350 Frequency of micturition: Secondary | ICD-10-CM | POA: Diagnosis not present

## 2022-05-25 ENCOUNTER — Ambulatory Visit: Payer: Medicare (Managed Care) | Admitting: Nurse Practitioner

## 2022-06-18 ENCOUNTER — Encounter (HOSPITAL_COMMUNITY): Payer: Self-pay

## 2022-06-18 ENCOUNTER — Emergency Department (HOSPITAL_COMMUNITY)
Admission: EM | Admit: 2022-06-18 | Discharge: 2022-06-18 | Disposition: A | Discharge status: Home / Self Care | Payer: Medicare (Managed Care) | Attending: Emergency Medicine | Admitting: Emergency Medicine

## 2022-06-18 ENCOUNTER — Other Ambulatory Visit: Payer: Self-pay

## 2022-06-18 ENCOUNTER — Emergency Department (HOSPITAL_COMMUNITY): Payer: Medicare (Managed Care)

## 2022-06-18 DIAGNOSIS — Z79899 Other long term (current) drug therapy: Secondary | ICD-10-CM | POA: Insufficient documentation

## 2022-06-18 DIAGNOSIS — Z8673 Personal history of transient ischemic attack (TIA), and cerebral infarction without residual deficits: Secondary | ICD-10-CM | POA: Diagnosis not present

## 2022-06-18 DIAGNOSIS — D649 Anemia, unspecified: Secondary | ICD-10-CM | POA: Insufficient documentation

## 2022-06-18 DIAGNOSIS — Z7901 Long term (current) use of anticoagulants: Secondary | ICD-10-CM | POA: Diagnosis not present

## 2022-06-18 DIAGNOSIS — J441 Chronic obstructive pulmonary disease with (acute) exacerbation: Secondary | ICD-10-CM | POA: Diagnosis not present

## 2022-06-18 DIAGNOSIS — Z1152 Encounter for screening for COVID-19: Secondary | ICD-10-CM | POA: Diagnosis not present

## 2022-06-18 DIAGNOSIS — R0789 Other chest pain: Secondary | ICD-10-CM | POA: Diagnosis not present

## 2022-06-18 DIAGNOSIS — I10 Essential (primary) hypertension: Secondary | ICD-10-CM | POA: Diagnosis not present

## 2022-06-18 DIAGNOSIS — J449 Chronic obstructive pulmonary disease, unspecified: Secondary | ICD-10-CM | POA: Diagnosis not present

## 2022-06-18 DIAGNOSIS — R059 Cough, unspecified: Secondary | ICD-10-CM | POA: Diagnosis not present

## 2022-06-18 LAB — CBC WITH DIFFERENTIAL/PLATELET
Abs Immature Granulocytes: 0.03 10*3/uL (ref 0.00–0.07)
Basophils Absolute: 0.1 10*3/uL (ref 0.0–0.1)
Basophils Relative: 1 %
Eosinophils Absolute: 0.4 10*3/uL (ref 0.0–0.5)
Eosinophils Relative: 4 %
HCT: 36.4 % (ref 36.0–46.0)
Hemoglobin: 11.5 g/dL — ABNORMAL LOW (ref 12.0–15.0)
Immature Granulocytes: 0 %
Lymphocytes Relative: 20 %
Lymphs Abs: 1.7 10*3/uL (ref 0.7–4.0)
MCH: 27.5 pg (ref 26.0–34.0)
MCHC: 31.6 g/dL (ref 30.0–36.0)
MCV: 87.1 fL (ref 80.0–100.0)
Monocytes Absolute: 0.7 10*3/uL (ref 0.1–1.0)
Monocytes Relative: 8 %
Neutro Abs: 5.8 10*3/uL (ref 1.7–7.7)
Neutrophils Relative %: 67 %
Platelets: 258 10*3/uL (ref 150–400)
RBC: 4.18 MIL/uL (ref 3.87–5.11)
RDW: 13.4 % (ref 11.5–15.5)
WBC: 8.7 10*3/uL (ref 4.0–10.5)
nRBC: 0 % (ref 0.0–0.2)

## 2022-06-18 LAB — COMPREHENSIVE METABOLIC PANEL
ALT: 11 U/L (ref 0–44)
AST: 17 U/L (ref 15–41)
Albumin: 3.2 g/dL — ABNORMAL LOW (ref 3.5–5.0)
Alkaline Phosphatase: 82 U/L (ref 38–126)
Anion gap: 10 (ref 5–15)
BUN: 13 mg/dL (ref 8–23)
CO2: 24 mmol/L (ref 22–32)
Calcium: 8.7 mg/dL — ABNORMAL LOW (ref 8.9–10.3)
Chloride: 104 mmol/L (ref 98–111)
Creatinine, Ser: 1.1 mg/dL — ABNORMAL HIGH (ref 0.44–1.00)
GFR, Estimated: 49 mL/min — ABNORMAL LOW (ref >60)
Glucose, Bld: 88 mg/dL (ref 70–99)
Potassium: 3.5 mmol/L (ref 3.5–5.1)
Sodium: 138 mmol/L (ref 135–145)
Total Bilirubin: 0.5 mg/dL (ref 0.3–1.2)
Total Protein: 7.1 g/dL (ref 6.5–8.1)

## 2022-06-18 LAB — SARS CORONAVIRUS 2 BY RT PCR: SARS Coronavirus 2 by RT PCR: NEGATIVE

## 2022-06-18 LAB — TROPONIN I (HIGH SENSITIVITY): Troponin I (High Sensitivity): 7 ng/L (ref <18)

## 2022-06-18 LAB — BRAIN NATRIURETIC PEPTIDE: B Natriuretic Peptide: 198.2 pg/mL — ABNORMAL HIGH (ref 0.0–100.0)

## 2022-06-18 MED ORDER — PREDNISONE 20 MG PO TABS: ORAL_TABLET | ORAL | 0 refills | Status: DC

## 2022-06-18 MED ORDER — ALBUTEROL SULFATE HFA 108 (90 BASE) MCG/ACT IN AERS
2.0000 | INHALATION_SPRAY | Freq: Once | RESPIRATORY_TRACT | Status: AC
  Administered 2022-06-18: 2 via RESPIRATORY_TRACT
  Filled 2022-06-18: qty 6.7

## 2022-06-18 MED ORDER — PREDNISONE 20 MG PO TABS
40.0000 mg | ORAL_TABLET | Freq: Once | ORAL | Status: AC
  Administered 2022-06-18: 40 mg via ORAL
  Filled 2022-06-18: qty 2

## 2022-06-18 MED ORDER — IPRATROPIUM-ALBUTEROL 0.5-2.5 (3) MG/3ML IN SOLN
3.0000 mL | Freq: Once | RESPIRATORY_TRACT | Status: AC
  Administered 2022-06-18: 3 mL via RESPIRATORY_TRACT
  Filled 2022-06-18: qty 3

## 2022-06-18 NOTE — ED Notes (Signed)
PT ambulated without trouble breathing and was speaking full sentences. Pt denies SOB and discomfort while walking. SaO2 remained around 90% with good pleth and pt quickly returned to baseline of 97% upon resting.

## 2022-06-18 NOTE — ED Provider Notes (Signed)
[place] EMERGENCY DEPARTMENT AT [facility] Provider Note   CSN: [id number] Arrival date & time: 06/18/22  0756     History  Chief Complaint  Patient presents with   Cough    [name] is a 88 y.o. female.  HPI 88 year old female with a history of A-fib, COPD, hypertension, stroke, presents with shortness of breath.  Started having a cough yesterday afternoon after church.  It is mildly productive of yellow sputum though that is chronic.  However her chest feels tight and she feels short of breath.  She denies any leg swelling or fever.  She has not heard any wheezing.  Symptoms seemed a little worse this morning so she called EMS.  There is no actual chest pain.  Home Medications Prior to Admission medications   Medication Sig Start Date End Date Taking? Authorizing Provider  predniSONE (DELTASONE) 20 MG tablet 2 tabs po daily x 4 days 06/19/22  Yes Pricilla Loveless, MD  acetaminophen (TYLENOL) 325 MG tablet Take 1-2 tablets (325-650 mg total) by mouth every 4 (four) hours as needed for mild pain. 12/26/21   Love, Evlyn Kanner, PA-C  amLODipine (NORVASC) 10 MG tablet Take by mouth. 02/08/22   [provider]  apixaban (ELIQUIS) 5 MG TABS tablet Take 1 tablet (5 mg total) by mouth 2 (two) times daily. 12/29/21   Love, Evlyn Kanner, PA-C  atenolol (TENORMIN) 25 MG tablet Take 0.5 tablets (12.5 mg total) by mouth daily with breakfast. 12/29/21   Love, Evlyn Kanner, PA-C  diclofenac Sodium (VOLTAREN) 1 % GEL Apply 2 g topically 4 (four) times daily. 12/29/21   Love, Evlyn Kanner, PA-C  gabapentin (NEURONTIN) 100 MG capsule Take 2 capsules (200 mg total) by mouth at bedtime. Patient not taking: Reported on 02/23/2022 12/29/21   Love, Evlyn Kanner, PA-C  loratadine (CLARITIN) 10 MG tablet Take 10 mg by mouth daily.    [provider]  LORazepam (ATIVAN) 1 MG tablet Take 0.5 tablets (0.5 mg total) by mouth at bedtime. Note medication was decreased and you should have additional pills at  home. Patient taking differently: Take 0.5 mg by mouth at bedtime. Pt taking 1 tab in morning, .5 tab night 12/29/21   Love, Evlyn Kanner, PA-C  losartan (COZAAR) 100 MG tablet Take 1 tablet (100 mg total) by mouth daily with breakfast. 12/29/21   Love, Evlyn Kanner, PA-C  Multiple Vitamin (MULTIVITAMIN PO) Take 1 tablet by mouth daily.    [provider]  omeprazole (PRILOSEC) 40 MG capsule Take 1 capsule (40 mg total) by mouth daily. 12/29/21   Love, Evlyn Kanner, PA-C  rosuvastatin (CRESTOR) 20 MG tablet Take 1 tablet (20 mg total) by mouth daily. 12/29/21   Love, Evlyn Kanner, PA-C  sertraline (ZOLOFT) 100 MG tablet Take 1 tablet (100 mg total) by mouth at bedtime. 12/29/21   Love, Evlyn Kanner, PA-C  sodium chloride (OCEAN) 0.65 % SOLN nasal spray Place 1 spray into both nostrils 2 (two) times daily.    [provider]      Allergies    Amoxicillin and Tetracyclines & related    Review of Systems   Review of Systems  Constitutional:  Negative for fever.  Respiratory:  Positive for cough, chest tightness and shortness of breath. Negative for wheezing.   Cardiovascular:  Negative for chest pain and leg swelling.    Physical Exam Updated Vital Signs BP (!) 145/54   Pulse 66   Temp 97.9 F (36.6 C) (Oral)  Resp 16   Ht 5' 4.5" (1.638 m)   Wt 63.5 kg   SpO2 99%   BMI 23.66 kg/m  Physical Exam Vitals and nursing note reviewed.  Constitutional:      General: She is not in acute distress.    Appearance: She is well-developed. She is not ill-appearing or diaphoretic.  HENT:     Head: Normocephalic and atraumatic.  Cardiovascular:     Rate and Rhythm: Normal rate and regular rhythm.     Heart sounds: Normal heart sounds.  Pulmonary:     Effort: Pulmonary effort is normal. No tachypnea or accessory muscle usage.     Breath sounds: Normal breath sounds.     Comments: Coarse breath sounds but no wheezing or rales appreciated Abdominal:     Palpations: Abdomen is soft.      Tenderness: There is no abdominal tenderness.  Musculoskeletal:     Right lower leg: No edema.     Left lower leg: No edema.  Skin:    General: Skin is warm and dry.  Neurological:     Mental Status: She is alert.     ED Results / Procedures / Treatments   Labs (all labs ordered are listed, but only abnormal results are displayed) Labs Reviewed  CBC WITH DIFFERENTIAL/PLATELET - Abnormal; Notable for the following components:      Result Value   Hemoglobin 11.5 (*)    All other components within normal limits  COMPREHENSIVE METABOLIC PANEL - Abnormal; Notable for the following components:   Creatinine, Ser 1.10 (*)    Calcium 8.7 (*)    Albumin 3.2 (*)    GFR, Estimated 49 (*)    All other components within normal limits  BRAIN NATRIURETIC PEPTIDE - Abnormal; Notable for the following components:   B Natriuretic Peptide 198.2 (*)    All other components within normal limits  SARS CORONAVIRUS 2 BY RT PCR  TROPONIN I (HIGH SENSITIVITY)    EKG EKG Interpretation  Date/Time:  Monday Jun 18 2022 08:13:09 EDT Ventricular Rate:  60 PR Interval:  155 QRS Duration: 95 QT Interval:  435 QTC Calculation: 435 R Axis:   26 Text Interpretation: Sinus rhythm Abnormal R-wave progression, early transition no significant change since April 2024 Confirmed by Pricilla Loveless [phone]) on 06/18/2022 8:47:53 AM  Radiology DG Chest 2 View  Result Date: 06/18/2022 CLINICAL DATA:  Productive cough and chest discomfort beginning yesterday. EXAM: CHEST - 2 VIEW COMPARISON:  04/23/2022 FINDINGS: The heart size and mediastinal contours are within normal limits. Pulmonary hyperinflation again seen, consistent with COPD. No evidence of pulmonary infiltrate or edema. No evidence of pleural effusion. IMPRESSION: COPD. No active cardiopulmonary disease. Electronically Signed   By: Danae Orleans M.D.   On: 06/18/2022 09:07    Procedures Procedures    Medications Ordered in ED Medications   ipratropium-albuterol (DUONEB) 0.5-2.5 (3) MG/3ML nebulizer solution 3 mL (3 mLs Nebulization Given 06/18/22 0818)  predniSONE (DELTASONE) tablet 40 mg (40 mg Oral Given 06/18/22 1037)  albuterol (VENTOLIN HFA) 108 (90 Base) MCG/ACT inhaler 2 puff (2 puffs Inhalation Given 06/18/22 1137)    ED Course/ Medical Decision Making/ A&P                             Medical Decision Making Amount and/or Complexity of Data Reviewed Labs: ordered.    Details: Normal troponin.  Normal COVID test.  Mildly worsened anemia at 11.5.  Nonspecific  BNP of 198 Radiology: ordered and independent interpretation performed.    Details: No pneumonia ECG/medicine tests: ordered and independent interpretation performed.    Details: No ischemia  Risk Prescription drug management.   Patient feels better after DuoNeb and her coarse breath sounds seem to be a lot better as well.  I suspect she has a mild COPD exacerbation.  I doubt PE given she is compliant with Eliquis and otherwise she has not been hypoxic.  She ambulated around the ED and felt okay.  No hypoxia.  She would like to go home which I think is reasonable we will give her a small burst of steroids.  Will give her an inhaler.  Otherwise, no clear pneumonia/infection.  Will discharge home with return precautions.        Final Clinical Impression(s) / ED Diagnoses Final diagnoses:  COPD exacerbation (HCC)    Rx / DC Orders ED Discharge Orders          Ordered    predniSONE (DELTASONE) 20 MG tablet        06/18/22 1051              Pricilla Loveless, MD 06/18/22 1534

## 2022-06-18 NOTE — ED Notes (Signed)
Patient placed on bedside commode by this tech with minimal assistance. Patient placed back in bed with all fall precautions met and call bell within reach.

## 2022-06-18 NOTE — ED Notes (Signed)
This RN reviewed discharge instructions with patient. She verbalized understanding and denied any further questions. PT well appearing upon discharge and reports tolerable pain. Pt wheeled to exit. Pt endorses ride home with daughter.

## 2022-06-18 NOTE — Discharge Instructions (Signed)
You were given an albuterol inhaler to use for cough, wheezing, and/or shortness of breath.  Use 1-2 puffs every 4 hours as needed for cough, wheezing, shortness of breath. If you are needing it significantly more than this or if you develop coughing up blood, fever, worse trouble breathing, or any other new/concerning symptoms then return to the ER or call 911.  You were given your first dose of steroids, prednisone, start the prescription tomorrow.

## 2022-06-18 NOTE — ED Triage Notes (Signed)
Pt to er room number 25 via ems, per ems pt is here for cough and some sob, states that she was feeling a little more congested and had more of a cough after church yesterday.

## 2022-06-18 NOTE — ED Notes (Signed)
Provider at bedside

## 2022-06-18 NOTE — ED Notes (Signed)
Pt reports improved breathing after neb.

## 2022-06-19 DIAGNOSIS — J441 Chronic obstructive pulmonary disease with (acute) exacerbation: Secondary | ICD-10-CM | POA: Diagnosis not present

## 2022-06-27 DIAGNOSIS — R350 Frequency of micturition: Secondary | ICD-10-CM | POA: Diagnosis not present

## 2022-07-13 ENCOUNTER — Ambulatory Visit: Payer: Medicare (Managed Care) | Attending: Nurse Practitioner | Admitting: Nurse Practitioner

## 2022-07-13 ENCOUNTER — Encounter: Payer: Self-pay | Admitting: Nurse Practitioner

## 2022-07-13 VITALS — BP 128/62 | HR 61 | Ht 64.0 in | Wt 138.8 lb

## 2022-07-13 DIAGNOSIS — J449 Chronic obstructive pulmonary disease, unspecified: Secondary | ICD-10-CM | POA: Diagnosis not present

## 2022-07-13 DIAGNOSIS — I10 Essential (primary) hypertension: Secondary | ICD-10-CM | POA: Diagnosis not present

## 2022-07-13 DIAGNOSIS — R072 Precordial pain: Secondary | ICD-10-CM | POA: Diagnosis not present

## 2022-07-13 DIAGNOSIS — I480 Paroxysmal atrial fibrillation: Secondary | ICD-10-CM

## 2022-07-13 DIAGNOSIS — Z8673 Personal history of transient ischemic attack (TIA), and cerebral infarction without residual deficits: Secondary | ICD-10-CM

## 2022-07-13 DIAGNOSIS — I6523 Occlusion and stenosis of bilateral carotid arteries: Secondary | ICD-10-CM | POA: Diagnosis not present

## 2022-07-13 DIAGNOSIS — E785 Hyperlipidemia, unspecified: Secondary | ICD-10-CM | POA: Diagnosis not present

## 2022-07-13 NOTE — Progress Notes (Unsigned)
Office Visit    Patient Name: [name] Date of Encounter: 07/13/2022  Primary Care Provider:  Blair Heys, MD Primary Cardiologist:  Olga Millers, MD  Chief Complaint    88 year old female with a history of paroxysmal atrial fibrillation, carotid artery stenosis s/p L carotid endarterectomy, hypertension, hyperlipidemia, CVA, COPD, and anxiety who presents for ED follow-up related to chest pain.  Past Medical History    Past Medical History:  Diagnosis Date   Arthritis    Atrial fibrillation (HCC)    Carotid artery occlusion    Colitis    COPD (chronic obstructive pulmonary disease) (HCC)    Diverticulitis    Hypertension    Keratosis, seborrheic    Lumbar spondylosis    w/impingement L3-S1   Past Surgical History:  Procedure Laterality Date   ABDOMINAL HYSTERECTOMY     BREAST EXCISIONAL BIOPSY Left    BREAST EXCISIONAL BIOPSY Left    BREAST EXCISIONAL BIOPSY Right    BREAST SURGERY     cyst removal   CAROTID ENDARTERECTOMY Left 2010   CHOLECYSTECTOMY     EYE SURGERY Bilateral 06/2014   cataracts   JOINT REPLACEMENT     RIGHT KNEE   REPLACEMENT TOTAL KNEE Right     Allergies  Allergies  Allergen Reactions   Amoxicillin Nausea Only and Rash    Has patient had a PCN reaction causing immediate rash, facial/tongue/throat swelling, SOB or lightheadedness with hypotension: Yes Has patient had a PCN reaction causing severe rash involving mucus membranes or skin necrosis: No Has patient had a PCN reaction that required hospitalization No Has patient had a PCN reaction occurring within the last 10 years: No If all of the above answers are "NO", then may proceed with Cephalosporin use.    Tetracyclines & Related Nausea Only and Rash     Labs/Other Studies Reviewed    The following studies were reviewed today:  Cardiac Studies & Procedures       ECHOCARDIOGRAM  ECHOCARDIOGRAM COMPLETE 12/15/2021  Narrative ECHOCARDIOGRAM REPORT    Patient  Name:   [name] Date of Exam: 12/15/2021 Medical Rec #:  [id number]   Height:       64.5 in Accession #:    [id number]  Weight:       152.1 lb Date of Birth:  [birth year]   BSA:          1.751 m Patient Age:    87 years    BP:           180/100 mmHg Patient Gender: F           HR:           62 bpm. Exam Location:  Inpatient  Procedure: 2D Echo, Color Doppler and Cardiac Doppler  Indications:    Atrial fibrillation  History:        Patient has prior history of Echocardiogram examinations, most recent 01/28/2017. CHF, COPD, Arrythmias:Atrial Fibrillation; Risk Factors:Hypertension.  Sonographer:    Milda Smart Referring Phys: [id number] RONDELL A SMITH   Sonographer Comments: Technically difficult study due to poor echo windows. Image acquisition challenging due to patient body habitus and Image acquisition challenging due to respiratory motion. IMPRESSIONS   1. Left ventricular ejection fraction, by estimation, is 55 to 60%. The left ventricle has normal function. The left ventricle has no regional wall motion abnormalities. Left ventricular diastolic parameters are consistent with Grade I diastolic dysfunction (impaired relaxation). 2. Right ventricular systolic function is normal. The right  ventricular size is normal. There is normal pulmonary artery systolic pressure. 3. The mitral valve is normal in structure. Mild mitral valve regurgitation. No evidence of mitral stenosis. 4. The aortic valve is tricuspid. There is mild calcification of the aortic valve. Aortic valve regurgitation is trivial. No aortic stenosis is present. 5. The inferior vena cava is normal in size with greater than 50% respiratory variability, suggesting right atrial pressure of 3 mmHg.  Comparison(s): MR improved from prior reporting.  FINDINGS Left Ventricle: Left ventricular ejection fraction, by estimation, is 55 to 60%. The left ventricle has normal function. The left ventricle has no regional wall  motion abnormalities. The left ventricular internal cavity size was normal in size. There is no left ventricular hypertrophy. Left ventricular diastolic parameters are consistent with Grade I diastolic dysfunction (impaired relaxation).  Right Ventricle: The right ventricular size is normal. No increase in right ventricular wall thickness. Right ventricular systolic function is normal. There is normal pulmonary artery systolic pressure. The tricuspid regurgitant velocity is 2.41 m/s, and with an assumed right atrial pressure of 3 mmHg, the estimated right ventricular systolic pressure is 26.2 mmHg.  Left Atrium: Left atrial size was normal in size.  Right Atrium: Right atrial size was normal in size.  Pericardium: Trivial pericardial effusion is present. Presence of epicardial fat layer.  Mitral Valve: The mitral valve is normal in structure. Mild mitral valve regurgitation. No evidence of mitral valve stenosis.  Tricuspid Valve: The tricuspid valve is normal in structure. Tricuspid valve regurgitation is mild . No evidence of tricuspid stenosis.  Aortic Valve: The aortic valve is tricuspid. There is mild calcification of the aortic valve. There is mild aortic valve annular calcification. Aortic valve regurgitation is trivial. No aortic stenosis is present.  Pulmonic Valve: The pulmonic valve was not well visualized. Pulmonic valve regurgitation is not visualized. No evidence of pulmonic stenosis.  Aorta: The aortic root and ascending aorta are structurally normal, with no evidence of dilitation.  Venous: The inferior vena cava is normal in size with greater than 50% respiratory variability, suggesting right atrial pressure of 3 mmHg.  IAS/Shunts: No atrial level shunt detected by color flow Doppler.   LEFT VENTRICLE PLAX 2D LVIDd:         3.90 cm     Diastology LVIDs:         2.90 cm     LV e' medial:    3.75 cm/s LV PW:         0.90 cm     LV E/e' medial:  18.0 LV IVS:        0.90  cm     LV e' lateral:   6.23 cm/s LVOT diam:     1.70 cm     LV E/e' lateral: 10.8 LV SV:         45 LV SV Index:   26 LVOT Area:     2.27 cm  LV Volumes (MOD) LV vol d, MOD A2C: 91.6 ml LV vol d, MOD A4C: 92.2 ml LV vol s, MOD A2C: 39.8 ml LV vol s, MOD A4C: 46.4 ml LV SV MOD A2C:     51.8 ml LV SV MOD A4C:     92.2 ml LV SV MOD BP:      47.7 ml  RIGHT VENTRICLE             IVC RV S prime:     10.10 cm/s  IVC diam: 1.30 cm TAPSE (M-mode): 1.6 cm  LEFT ATRIUM             Index        RIGHT ATRIUM           Index LA diam:        3.20 cm 1.83 cm/m   RA Area:     14.60 cm LA Vol (A2C):   49.8 ml 28.44 ml/m  RA Volume:   36.00 ml  20.56 ml/m LA Vol (A4C):   44.7 ml 25.53 ml/m LA Biplane Vol: 50.1 ml 28.61 ml/m AORTIC VALVE LVOT Vmax:   85.30 cm/s LVOT Vmean:  61.800 cm/s LVOT VTI:    0.200 m  AORTA Ao Root diam: 2.70 cm Ao Asc diam:  2.80 cm  MITRAL VALVE               TRICUSPID VALVE MV Area (PHT): 2.38 cm    TR Peak grad:   23.2 mmHg MV Decel Time: 319 msec    TR Mean grad:   17.0 mmHg MR Peak grad: 100.4 mmHg   TR Vmax:        241.00 cm/s MR Vmax:      501.00 cm/s  TR Vmean:       196.0 cm/s MV E velocity: 67.50 cm/s MV A velocity: 82.50 cm/s  SHUNTS MV E/A ratio:  0.82        Systemic VTI:  0.20 m Systemic Diam: 1.70 cm  Riley Lam MD Electronically signed by Riley Lam MD Signature Date/Time: 12/15/2021/12:33:15 PM    Final            Recent Labs: 04/23/2022: Magnesium 1.7 06/18/2022: ALT 11; B Natriuretic Peptide 198.2; BUN 13; Creatinine, Ser 1.10; Hemoglobin 11.5; Platelets 258; Potassium 3.5; Sodium 138  Recent Lipid Panel    Component Value Date/Time   CHOL 90 12/15/2021 0244   TRIG 103 12/15/2021 0244   HDL 42 12/15/2021 0244   CHOLHDL 2.1 12/15/2021 0244   VLDL 21 12/15/2021 0244   LDLCALC 27 12/15/2021 0244    History of Present Illness    88 year old female with with the above past medical history including  paroxysmal atrial fibrillation, carotid artery stenosis s/p L carotid endarterectomy, hypertension, hyperlipidemia, CVA, COPD, and anxiety.    She was hospitalized in 2019 in the setting of small bowel obstruction.  She was noted to be in atrial fibrillation but converted spontaneously to sinus rhythm.  TSH was normal.  Echocardiogram at the time showed normal LV function, moderate diastolic dysfunction, mild mitral valve regurgitation, mild to moderate tricuspid valve regurgitation.  She previously declined anticoagulation, but has been maintained on Eliquis following CVA in 11/2021.  She has a history of carotid artery disease s/p left CEA.  Carotid Dopplers in 2021 showed 1 to 39% R ICA and 40 to 59% LICA stenosis.   She was last seen in the office on 02/15/2020 and was stable from a cardiac standpoint.  She was hospitalized in 11/2021 in the setting of acute CVA.  Echocardiogram at that time showed EF 55 to 60%, no RWMA, normal LV function, G1 DD, normal RV systolic function, mild mitral valve regurgitation, mild tricuspid valve regurgitation.  She was started on Eliquis. She was seen in the ED on 04/23/2022 in the setting of chest/epigastric pain that lasted for several hours and resolved spontaneously.  EKG was unremarkable, troponin was negative.  She was advised to follow-up with cardiology as an outpatient.  She returned to the ED on 06/18/2022 with persistent cough.  She was treated with steroids and inhalers for suspected COPD exacerbation and discharged home in stable condition.  She presents today for follow-up accompanied by her niece.  Since her last visit and since her most recent ED visits he has been stable from a cardiac standpoint.  She has had 1 or 2 episodes of brief chest discomfort that have occurred at night while lying in bed, improved with lorazepam.  She denies any exertional symptoms concerning for angina.  She has been working hard with physical therapy following her  hospitalization/stroke in November 2023 and has made significant progress.  She is walking well with a walker and has even progressed to using a cane at times.  She denies any significant dyspnea, palpitations, edema, PND, orthopnea, weight gain.  BP has been well-controlled.  Overall, she reports feeling well.  Home Medications    Current Outpatient Medications  Medication Sig Dispense Refill   acetaminophen (TYLENOL) 325 MG tablet Take 1-2 tablets (325-650 mg total) by mouth every 4 (four) hours as needed for mild pain.     amLODipine (NORVASC) 10 MG tablet Take by mouth.     apixaban (ELIQUIS) 5 MG TABS tablet Take 1 tablet (5 mg total) by mouth 2 (two) times daily. 60 tablet 0   atenolol (TENORMIN) 25 MG tablet Take 0.5 tablets (12.5 mg total) by mouth daily with breakfast. 30 tablet 0   diclofenac Sodium (VOLTAREN) 1 % GEL Apply 2 g topically 4 (four) times daily. 150 g 1   loratadine (CLARITIN) 10 MG tablet Take 10 mg by mouth daily.     LORazepam (ATIVAN) 1 MG tablet Take 0.5 tablets (0.5 mg total) by mouth at bedtime. Note medication was decreased and you should have additional pills at home. (Patient taking differently: Take 0.5 mg by mouth at bedtime. Pt taking 1 tab in morning, .5 tab night) 30 tablet 0   losartan (COZAAR) 100 MG tablet Take 1 tablet (100 mg total) by mouth daily with breakfast. 30 tablet 0   Multiple Vitamin (MULTIVITAMIN PO) Take 1 tablet by mouth daily.     omeprazole (PRILOSEC) 40 MG capsule Take 1 capsule (40 mg total) by mouth daily. 30 capsule 0   rosuvastatin (CRESTOR) 20 MG tablet Take 1 tablet (20 mg total) by mouth daily. 30 tablet 0   sertraline (ZOLOFT) 100 MG tablet Take 1 tablet (100 mg total) by mouth at bedtime. 30 tablet 0   sodium chloride (OCEAN) 0.65 % SOLN nasal spray Place 1 spray into both nostrils 2 (two) times daily.     gabapentin (NEURONTIN) 100 MG capsule Take 2 capsules (200 mg total) by mouth at bedtime. (Patient not taking: Reported on  02/23/2022) 30 capsule 0   predniSONE (DELTASONE) 20 MG tablet 2 tabs po daily x 4 days 8 tablet 0   No current facility-administered medications for this visit.     Review of Systems    She denies palpitations, pnd, orthopnea, n, v, dizziness, syncope, edema, weight gain, or early satiety. All other systems reviewed and are otherwise negative except as noted above.   Physical Exam    VS:  BP 128/62   Pulse 61   Ht 5\' 4"  (1.626 m)   Wt 138 lb 12.8 oz (63 kg)   SpO2 98%   BMI 23.82 kg/m  GEN: Well nourished, well developed, in no acute distress. HEENT: normal. Neck: Supple, no JVD, carotid bruits, or masses. Cardiac: RRR, no murmurs, rubs, or gallops. No clubbing, cyanosis, edema.  Radials/DP/PT 2+ and equal bilaterally.  Respiratory:  Respirations regular and unlabored, clear to auscultation bilaterally. GI: Soft, nontender, nondistended, BS + x 4. MS: no deformity or atrophy. Skin: warm and dry, no rash.  Neuro:  Strength and sensation are intact. Psych: Normal affect.  Accessory Clinical Findings    ECG personally reviewed by me today - EKG Interpretation  Date/Time:  Friday July 13 2022 15:44:55 EDT Ventricular Rate:  61 PR Interval:  156 QRS Duration: 78 QT Interval:  416 QTC Calculation: 418 R Axis:   36 Text Interpretation: Normal sinus rhythm Normal ECG When compared with ECG of 18-Jun-2022 08:13, PREVIOUS ECG IS PRESENT Confirmed by Bernadene Person ([id number]) on 07/13/2022 4:24:16 PM  - no acute changes.   Lab Results  Component Value Date   WBC 8.7 06/18/2022   HGB 11.5 (L) 06/18/2022   HCT 36.4 06/18/2022   MCV 87.1 06/18/2022   PLT 258 06/18/2022   Lab Results  Component Value Date   CREATININE 1.10 (H) 06/18/2022   BUN 13 06/18/2022   NA 138 06/18/2022   K 3.5 06/18/2022   CL 104 06/18/2022   CO2 24 06/18/2022   Lab Results  Component Value Date   ALT 11 06/18/2022   AST 17 06/18/2022   ALKPHOS 82 06/18/2022   BILITOT 0.5 06/18/2022   Lab Results   Component Value Date   CHOL 90 12/15/2021   HDL 42 12/15/2021   LDLCALC 27 12/15/2021   TRIG 103 12/15/2021   CHOLHDL 2.1 12/15/2021    Lab Results  Component Value Date   HGBA1C 5.7 (H) 12/15/2021    Assessment & Plan    1. Chest pain: She has had rare episodes of intermittent chest discomfort that occur only when she is lying in bed at night.  Her symptoms improve with lorazepam.  She denies any exertional symptoms.  Recent ED workup reassuring.  Symptoms atypical for angina, patient agrees, will defer ischemic evaluation at this time.  Discussed ED precautions.  2. Paroxysmal atrial fibrillation: Maintaining sinus rhythm.  Denies any awareness of recurrent atrial fibrillation, denies any significant palpitations.  Continue atenolol, Eliquis.  3. Carotid artery disease: S/p L CEA. Carotid Dopplers in 2021 showed 1 to 39% R ICA and 40 to 59% LICA stenosis.  CT angio of the head and neck during hospitalization in 11/2021 was stable.  Recommend follow-up with vascular surgery per routine (she is overdue for follow-up).   4. History of CVA: Following with neurology.  Continue Eliquis, Crestor.  5. Hypertension: BP well controlled. Continue current antihypertensive regimen.   6. Hyperlipidemia: LDL was 27 in 11/2021.  Continue Crestor.  7. COPD: Denies any significant dyspnea.  Overall stable.  8. Disposition: Follow-up in 6 months.       Joylene Grapes, NP 07/13/2022, 4:44 PM

## 2022-07-13 NOTE — Patient Instructions (Signed)
Medication Instructions:  Your physician recommends that you continue on your current medications as directed. Please refer to the Current Medication list given to you today.  *If you need a refill on your cardiac medications before your next appointment, please call your pharmacy*   Lab Work: NONE ordered at this time of appointment    Testing/Procedures: NONE ordered at this time of appointment     Follow-Up: At [facility], you and your health needs are our priority.  As part of our continuing mission to provide you with exceptional heart care, we have created designated Provider Care Teams.  These Care Teams include your primary Cardiologist (physician) and Advanced Practice Providers (APPs -  Physician Assistants and Nurse Practitioners) who all work together to provide you with the care you need, when you need it.  We recommend signing up for the patient portal called "MyChart".  Sign up information is provided on this After Visit Summary.  MyChart is used to connect with patients for Virtual Visits (Telemedicine).  Patients are able to view lab/test results, encounter notes, upcoming appointments, etc.  Non-urgent messages can be sent to your provider as well.   To learn more about what you can do with MyChart, go to [email or web address].    Your next appointment:   6 month(s)  Provider:   Olga Millers, MD     Other Instructions

## 2022-07-15 ENCOUNTER — Encounter: Payer: Self-pay | Admitting: Nurse Practitioner

## 2022-07-19 DIAGNOSIS — M1990 Unspecified osteoarthritis, unspecified site: Secondary | ICD-10-CM | POA: Diagnosis not present

## 2022-07-19 DIAGNOSIS — Z0000 Encounter for general adult medical examination without abnormal findings: Secondary | ICD-10-CM | POA: Diagnosis not present

## 2022-07-19 DIAGNOSIS — I700 Atherosclerosis of aorta: Secondary | ICD-10-CM | POA: Diagnosis not present

## 2022-07-19 DIAGNOSIS — Z9181 History of falling: Secondary | ICD-10-CM | POA: Diagnosis not present

## 2022-07-19 DIAGNOSIS — Z1331 Encounter for screening for depression: Secondary | ICD-10-CM | POA: Diagnosis not present

## 2022-07-19 DIAGNOSIS — K5289 Other specified noninfective gastroenteritis and colitis: Secondary | ICD-10-CM | POA: Diagnosis not present

## 2022-07-19 DIAGNOSIS — I480 Paroxysmal atrial fibrillation: Secondary | ICD-10-CM | POA: Diagnosis not present

## 2022-07-19 DIAGNOSIS — D6869 Other thrombophilia: Secondary | ICD-10-CM | POA: Diagnosis not present

## 2022-07-19 DIAGNOSIS — N183 Chronic kidney disease, stage 3 unspecified: Secondary | ICD-10-CM | POA: Diagnosis not present

## 2022-07-19 DIAGNOSIS — F411 Generalized anxiety disorder: Secondary | ICD-10-CM | POA: Diagnosis not present

## 2022-07-19 DIAGNOSIS — I129 Hypertensive chronic kidney disease with stage 1 through stage 4 chronic kidney disease, or unspecified chronic kidney disease: Secondary | ICD-10-CM | POA: Diagnosis not present

## 2022-07-19 DIAGNOSIS — E781 Pure hyperglyceridemia: Secondary | ICD-10-CM | POA: Diagnosis not present

## 2022-07-19 LAB — LAB REPORT - SCANNED: EGFR: 48

## 2022-08-28 DIAGNOSIS — H6121 Impacted cerumen, right ear: Secondary | ICD-10-CM | POA: Diagnosis not present

## 2022-09-18 NOTE — Progress Notes (Deleted)
Name: [name] DOB: 1934/02/11 MRN: [id number]  History of Present Illness: [name] is a 88 y.o. female who presents today as a new patient at [facility]. All available relevant medical records have been reviewed.  - GU History: 1. OAB with urinary frequency and nocturia x1. 2. Stress urinary incontinence. - Oxybutynin and Vesicare were not very helpful & caused dry mouth. - No response to Myrbetriq 50 mg.   Previously followed by Dr. Sherron Monday at [facility]; per last visit note there on 06/27/2022: - Intermittent mild stress urinary incontinence. - Denies urge incontinence.  - Intermittent nocturnal enuresis. - Nocturia x1. - "On pelvic examination patient had narrow introitus with adhesions at the lower aspect. She had no significant prolapse or stress incontinence after cystoscopy".  - PVR = 0 ml - Normal cystoscopy 05/22/2022. - No response to Myrbetriq 50 mg.  - The plan was: to trial Gemtesa x5 weeks; if no improvement will then plan for urodynamic testing for further evaluation.  Today: She reports chief complaint of ***  She reports urinary ***frequency, ***nocturia, ***urgency, and ***urge incontinence. Voiding ***x/day and ***x/night on average. Leaking ***x/day on average; using *** ***pads / ***diapers per day on average.  She {Actions; denies-reports:120008} routine caffeine intake (*** cups of coffee / tea / soda per day).  She {Actions; denies-reports:120008} history of obstructive sleep apnea and {Actions; denies-reports:120008} wearing CPAP routinely. ***She denies ever having a sleep study before. She {Actions; denies-reports:120008} fluid intake within 3 hours prior to bedtime. She {Actions; denies-reports:120008} fluid intake during the night.  She {Actions; denies-reports:120008} caffeine intake within 8 hours prior to bedtime. She {Actions; denies-reports:120008} routinely experiencing lower extremity edema during the day. ***She  {Actions; denies-reports:120008} elevating feet during the day and/or wearing compression socks when lower extremity edema is present during the day. ***She {Actions; denies-reports:120008} monitoring dietary salt and sodium intake.  She {Actions; denies-reports:120008} dysuria, gross hematuria, straining to void, or sensations of incomplete emptying.   Fall Screening: Do you usually have a device to assist in your mobility? {yes/no:20286} ***cane / ***walker / ***wheelchair  Medications: Current Outpatient Medications  Medication Sig Dispense Refill   acetaminophen (TYLENOL) 325 MG tablet Take 1-2 tablets (325-650 mg total) by mouth every 4 (four) hours as needed for mild pain.     amLODipine (NORVASC) 10 MG tablet Take by mouth.     apixaban (ELIQUIS) 5 MG TABS tablet Take 1 tablet (5 mg total) by mouth 2 (two) times daily. 60 tablet 0   atenolol (TENORMIN) 25 MG tablet Take 0.5 tablets (12.5 mg total) by mouth daily with breakfast. 30 tablet 0   diclofenac Sodium (VOLTAREN) 1 % GEL Apply 2 g topically 4 (four) times daily. 150 g 1   gabapentin (NEURONTIN) 100 MG capsule Take 2 capsules (200 mg total) by mouth at bedtime. (Patient not taking: Reported on 02/23/2022) 30 capsule 0   loratadine (CLARITIN) 10 MG tablet Take 10 mg by mouth daily.     LORazepam (ATIVAN) 1 MG tablet Take 0.5 tablets (0.5 mg total) by mouth at bedtime. Note medication was decreased and you should have additional pills at home. (Patient taking differently: Take 0.5 mg by mouth at bedtime. Pt taking 1 tab in morning, .5 tab night) 30 tablet 0   losartan (COZAAR) 100 MG tablet Take 1 tablet (100 mg total) by mouth daily with breakfast. 30 tablet 0   Multiple Vitamin (MULTIVITAMIN PO) Take 1 tablet by mouth daily.  omeprazole (PRILOSEC) 40 MG capsule Take 1 capsule (40 mg total) by mouth daily. 30 capsule 0   predniSONE (DELTASONE) 20 MG tablet 2 tabs po daily x 4 days 8 tablet 0   rosuvastatin (CRESTOR) 20 MG tablet  Take 1 tablet (20 mg total) by mouth daily. 30 tablet 0   sertraline (ZOLOFT) 100 MG tablet Take 1 tablet (100 mg total) by mouth at bedtime. 30 tablet 0   sodium chloride (OCEAN) 0.65 % SOLN nasal spray Place 1 spray into both nostrils 2 (two) times daily.     No current facility-administered medications for this visit.    Allergies: Allergies  Allergen Reactions   Amoxicillin Nausea Only and Rash    Has patient had a PCN reaction causing immediate rash, facial/tongue/throat swelling, SOB or lightheadedness with hypotension: Yes Has patient had a PCN reaction causing severe rash involving mucus membranes or skin necrosis: No Has patient had a PCN reaction that required hospitalization No Has patient had a PCN reaction occurring within the last 10 years: No If all of the above answers are "NO", then may proceed with Cephalosporin use.    Tetracyclines & Related Nausea Only and Rash    Past Medical History:  Diagnosis Date   Arthritis    Atrial fibrillation (HCC)    Carotid artery occlusion    Colitis    COPD (chronic obstructive pulmonary disease) (HCC)    Diverticulitis    Hypertension    Keratosis, seborrheic    Lumbar spondylosis    w/impingement L3-S1   Past Surgical History:  Procedure Laterality Date   ABDOMINAL HYSTERECTOMY     BREAST EXCISIONAL BIOPSY Left    BREAST EXCISIONAL BIOPSY Left    BREAST EXCISIONAL BIOPSY Right    BREAST SURGERY     cyst removal   CAROTID ENDARTERECTOMY Left 2010   CHOLECYSTECTOMY     EYE SURGERY Bilateral 06/2014   cataracts   JOINT REPLACEMENT     RIGHT KNEE   REPLACEMENT TOTAL KNEE Right    Family History  Problem Relation Age of Onset   Heart disease Mother        After age 34   AAA (abdominal aortic aneurysm) Sister    AAA (abdominal aortic aneurysm) Brother    Heart attack Brother    Breast cancer Cousin    Breast cancer Other    Breast cancer Other    Social History   Socioeconomic History   Marital status:  Widowed    Spouse name: Not on file   Number of children: Not on file   Years of education: Not on file   Highest education level: Not on file  Occupational History   Not on file  Tobacco Use   Smoking status: Former    Current packs/day: 0.00    Average packs/day: 1.5 packs/day for 50.0 years (75.0 ttl pk-yrs)    Types: Cigarettes    Start date: 02/05/1952    Quit date: 02/04/2002    Years since quitting: 20.6   Smokeless tobacco: Never  Vaping Use   Vaping status: Never Used  Substance and Sexual Activity   Alcohol use: No   Drug use: No   Sexual activity: Not on file  Other Topics Concern   Not on file  Social History Narrative   Not on file   Social Determinants of Health   Financial Resource Strain: Not on file  Food Insecurity: Not on file  Transportation Needs: Not on file  Physical Activity: Not  on file  Stress: Not on file  Social Connections: Not on file  Intimate Partner Violence: Not on file    SUBJECTIVE  Review of Systems Constitutional: Patient ***denies any unintentional weight loss or change in strength lntegumentary: Patient ***denies any rashes or pruritus Eyes: Patient denies ***dry eyes ENT: Patient ***denies dry mouth Cardiovascular: Patient ***denies chest pain or syncope Respiratory: Patient ***denies shortness of breath Gastrointestinal: Patient ***denies nausea, vomiting, constipation, or diarrhea Musculoskeletal: Patient ***denies muscle cramps or weakness Neurologic: Patient ***denies convulsions or seizures Psychiatric: Patient ***denies memory problems Allergic/Immunologic: Patient ***denies recent allergic reaction(s) Hematologic/Lymphatic: Patient denies bleeding tendencies Endocrine: Patient ***denies heat/cold intolerance  GU: As per HPI.  OBJECTIVE There were no vitals filed for this visit. There is no height or weight on file to calculate BMI.  Physical Examination  Constitutional: ***No obvious distress; patient is  ***non-toxic appearing  Cardiovascular: ***No visible lower extremity edema.  Respiratory: The patient does ***not have audible wheezing/stridor; respirations do ***not appear labored  Gastrointestinal: Abdomen ***non-distended Musculoskeletal: ***Normal ROM of UEs  Skin: ***No obvious rashes/open sores  Neurologic: CN 2-12 grossly ***intact Psychiatric: Answered questions ***appropriately with ***normal affect  Hematologic/Lymphatic/Immunologic: ***No obvious bruises or sites of spontaneous bleeding  UA: ***negative / *** WBC/hpf, *** RBC/hpf, bacteria (***) PVR: *** ml  ASSESSMENT No diagnosis found.  We discussed the symptoms of overactive bladder (OAB), which include urinary urgency, frequency, nocturia, with or without urge incontinence.   While we may not know the exact etiology of OAB, several risk factors can be identified.  - We discussed this patient's neurogenic risk factors for OAB-type symptoms including ***T2DM, ***nicotine use, ***.  - Likely exacerbated by ***diuretic use, ***caffeine intake, ***consumption of bladder irritants such as (acidic foods, spicy foods, alcohol).   We discussed the following management options in detail including potential benefits, risks, and side effects: Behavioral therapy: Modify fluid intake Decreasing bladder irritants (such as caffeine, acidic foods, spicy foods, alcohol) Urge suppression strategies Bladder retraining / timed voiding Double voiding Medication(s): - For anticholinergic medications, we discussed the potential side effects of anticholinergics including dry eyes, dry mouth, constipation, cognitive impairment and urinary retention.  - For beta-3 agonist medication, we discussed the risk for urinary retention and the potential side effect of elevated blood pressure specific to Myrbetriq (which is more likely to occur in individuals with uncontrolled hypertension).  For refractory cases: PTNS (posterior tibial nerve  stimulation) Sacral neuromodulation trial (Medtronic lnterStim or Axonics implant) Bladder Botox injections In extreme cases, SP tube placement  ***In order to further evaluate urinary incontinence, She was instructed to complete a 3-day bladder diary.  ***Discussed recommendation for urodynamic testing, which was described in detail including risks such as bleeding, infection, organ/tissue/nerve damage.  She decided to *** ***work on behavioral modifications including ***minimizing caffeine intake and working on ***timed voiding.  Will plan for follow up in ***8 weeks / *** months or sooner if needed. Pt verbalized understanding and agreement. All questions were answered.  PLAN Advised the following: *** ***. Minimize caffeine intake. ***. Work on timed voiding. ***. Urge suppression strategies. ***. Double/ triple voiding. ***. No follow-ups on file.  No orders of the defined types were placed in this encounter.   It has been explained that the patient is to follow regularly with their PCP in addition to all other providers involved in their care and to follow instructions provided by these respective offices. Patient advised to contact urology clinic if any urologic-pertaining questions, concerns, new symptoms or  problems arise in the interim period.  There are no Patient Instructions on file for this visit.  Electronically signed by:  Donnita Falls, MSN, FNP-C, CUNP 09/18/2022 8:40 AM

## 2022-09-20 ENCOUNTER — Ambulatory Visit: Payer: Medicare (Managed Care) | Admitting: Urology

## 2022-09-20 DIAGNOSIS — N3281 Overactive bladder: Secondary | ICD-10-CM

## 2022-09-20 DIAGNOSIS — N393 Stress incontinence (female) (male): Secondary | ICD-10-CM

## 2022-09-20 DIAGNOSIS — R351 Nocturia: Secondary | ICD-10-CM

## 2022-09-20 DIAGNOSIS — N3944 Nocturnal enuresis: Secondary | ICD-10-CM

## 2023-01-18 DIAGNOSIS — K219 Gastro-esophageal reflux disease without esophagitis: Secondary | ICD-10-CM | POA: Diagnosis not present

## 2023-01-18 DIAGNOSIS — J439 Emphysema, unspecified: Secondary | ICD-10-CM | POA: Diagnosis not present

## 2023-01-18 DIAGNOSIS — I5189 Other ill-defined heart diseases: Secondary | ICD-10-CM | POA: Diagnosis not present

## 2023-01-18 DIAGNOSIS — I10 Essential (primary) hypertension: Secondary | ICD-10-CM | POA: Diagnosis not present

## 2023-01-18 DIAGNOSIS — N183 Chronic kidney disease, stage 3 unspecified: Secondary | ICD-10-CM | POA: Diagnosis not present

## 2023-01-18 DIAGNOSIS — J310 Chronic rhinitis: Secondary | ICD-10-CM | POA: Diagnosis not present

## 2023-01-18 DIAGNOSIS — Z23 Encounter for immunization: Secondary | ICD-10-CM | POA: Diagnosis not present

## 2023-01-18 DIAGNOSIS — E782 Mixed hyperlipidemia: Secondary | ICD-10-CM | POA: Diagnosis not present

## 2023-01-18 DIAGNOSIS — I6523 Occlusion and stenosis of bilateral carotid arteries: Secondary | ICD-10-CM | POA: Diagnosis not present

## 2023-01-18 DIAGNOSIS — F331 Major depressive disorder, recurrent, moderate: Secondary | ICD-10-CM | POA: Diagnosis not present

## 2023-01-18 DIAGNOSIS — I480 Paroxysmal atrial fibrillation: Secondary | ICD-10-CM | POA: Diagnosis not present

## 2023-01-18 DIAGNOSIS — D6869 Other thrombophilia: Secondary | ICD-10-CM | POA: Diagnosis not present

## 2023-02-11 NOTE — Progress Notes (Signed)
Name: [name] DOB: [birth year] MRN: [id number]  History of Present Illness: [name] is a 89 y.o. female who presents today as a new patient at [facility] . All available relevant medical records have been reviewed. She is accompanied by her niece [name]. - GU History: 1. OAB. - Oxybutynin & Vesicare: not helpful; caused dry mouth. - Myrbetriq 50 mg not helpful. 2. Stress urinary incontinence.  Previously saw Dr. Sherron Monday at [facility]; per note on 06/27/2022: - Intermittent mild stress urinary incontinence. - Denies urge incontinence. - Intermittent nocturnal enuresis. - Nocturia x1. - "On pelvic examination patient had narrow introitus with adhesions at the lower aspect. She had no significant prolapse or stress incontinence after cystoscopy". - PVR = 0 ml. - Normal cystoscopy 05/22/2022. - No response to Myrbetriq 50 mg. - The plan was to trial Gemtesa x5 weeks; if no improvement will then plan for urodynamic testing for further evaluation.  Today: She denies ever starting [place] and confirms that the prior OAB-type medications did not help at all.   She reports urinary urgency and nocturia. Voiding 3-4x/day and 2x/night on average. Leaking small amounts of urine daily; using 1-2 pads per day on average. Denies urinary frequency.  Reports mixed urinary incontinence (urge predominant). States her stress urinary incontinence is rare.  She denies increased urinary frequency, dysuria, gross hematuria, flank pain, or abdominal pain. Denies history of kidney stones.   Reports occasionally straining to void and occasional sensations of incomplete emptying.  She denies history of recent or recurrent UTI.  She reports some caffeine intake (1-2 cups of coffee of coffee per day on average; no significant tea or caffeinated soda intake).  She denies history of obstructive sleep apnea. She denies ever having a sleep study before. She reports fluid intake  within 3 hours prior to bedtime. She reports some fluid intake during the night due to dry mouth.  She denies caffeine intake within 8 hours prior to bedtime. She denies routinely experiencing lower extremity edema during the day.   Fall Screening: Do you usually have a device to assist in your mobility? Yes - walker   Medications: Current Outpatient Medications  Medication Sig Dispense Refill   acetaminophen (TYLENOL) 325 MG tablet Take 1-2 tablets (325-650 mg total) by mouth every 4 (four) hours as needed for mild pain.     amLODipine (NORVASC) 10 MG tablet Take by mouth.     apixaban (ELIQUIS) 5 MG TABS tablet Take 1 tablet (5 mg total) by mouth 2 (two) times daily. 60 tablet 0   atenolol (TENORMIN) 25 MG tablet Take 0.5 tablets (12.5 mg total) by mouth daily with breakfast. 30 tablet 0   diclofenac Sodium (VOLTAREN) 1 % GEL Apply 2 g topically 4 (four) times daily. 150 g 1   loratadine (CLARITIN) 10 MG tablet Take 10 mg by mouth daily.     LORazepam (ATIVAN) 1 MG tablet Take 0.5 tablets (0.5 mg total) by mouth at bedtime. Note medication was decreased and you should have additional pills at home. (Patient taking differently: Take 0.5 mg by mouth at bedtime. Pt taking 1 tab in morning, .5 tab night) 30 tablet 0   losartan (COZAAR) 100 MG tablet Take 1 tablet (100 mg total) by mouth daily with breakfast. 30 tablet 0   Multiple Vitamin (MULTIVITAMIN PO) Take 1 tablet by mouth daily.     omeprazole (PRILOSEC) 40 MG capsule Take 1 capsule (40 mg total) by mouth daily. 30 capsule 0  rosuvastatin (CRESTOR) 20 MG tablet Take 1 tablet (20 mg total) by mouth daily. 30 tablet 0   sertraline (ZOLOFT) 100 MG tablet Take 1 tablet (100 mg total) by mouth at bedtime. 30 tablet 0   sodium chloride (OCEAN) 0.65 % SOLN nasal spray Place 1 spray into both nostrils 2 (two) times daily.     Vibegron (GEMTESA) 75 MG TABS Take 1 tablet (75 mg total) by mouth daily.     No current facility-administered  medications for this visit.    Allergies: Allergies  Allergen Reactions   Amoxicillin Nausea Only and Rash    Has patient had a PCN reaction causing immediate rash, facial/tongue/throat swelling, SOB or lightheadedness with hypotension: Yes Has patient had a PCN reaction causing severe rash involving mucus membranes or skin necrosis: No Has patient had a PCN reaction that required hospitalization No Has patient had a PCN reaction occurring within the last 10 years: No If all of the above answers are "NO", then may proceed with Cephalosporin use.    Tetracyclines & Related Nausea Only and Rash    Past Medical History:  Diagnosis Date   Arthritis    Atrial fibrillation (HCC)    Carotid artery occlusion    Colitis    COPD (chronic obstructive pulmonary disease) (HCC)    Diverticulitis    Hypertension    Keratosis, seborrheic    Lumbar spondylosis    w/impingement L3-S1   Past Surgical History:  Procedure Laterality Date   ABDOMINAL HYSTERECTOMY     BREAST EXCISIONAL BIOPSY Left    BREAST EXCISIONAL BIOPSY Left    BREAST EXCISIONAL BIOPSY Right    BREAST SURGERY     cyst removal   CAROTID ENDARTERECTOMY Left 2010   CHOLECYSTECTOMY     EYE SURGERY Bilateral 06/2014   cataracts   JOINT REPLACEMENT     RIGHT KNEE   REPLACEMENT TOTAL KNEE Right    Family History  Problem Relation Age of Onset   Heart disease Mother        After age 47   AAA (abdominal aortic aneurysm) Sister    AAA (abdominal aortic aneurysm) Brother    Heart attack Brother    Breast cancer Cousin    Breast cancer Other    Breast cancer Other    Social History   Socioeconomic History   Marital status: Widowed    Spouse name: Not on file   Number of children: Not on file   Years of education: Not on file   Highest education level: Not on file  Occupational History   Not on file  Tobacco Use   Smoking status: Former    Current packs/day: 0.00    Average packs/day: 1.5 packs/day for 50.0  years (75.0 ttl pk-yrs)    Types: Cigarettes    Start date: 02/05/1952    Quit date: 02/04/2002    Years since quitting: 21.0   Smokeless tobacco: Never  Vaping Use   Vaping status: Never Used  Substance and Sexual Activity   Alcohol use: No   Drug use: No   Sexual activity: Not on file  Other Topics Concern   Not on file  Social History Narrative   Not on file   Social Drivers of Health   Financial Resource Strain: Not on file  Food Insecurity: Not on file  Transportation Needs: Not on file  Physical Activity: Not on file  Stress: Not on file  Social Connections: Not on file  Intimate Partner Violence: Not  on file    SUBJECTIVE  Review of Systems Constitutional: Patient denies any unintentional weight loss or change in strength lntegumentary: Patient denies any rashes or pruritus Eyes: Patient reports dry eyes ENT: Patient reports dry mouth Cardiovascular: Patient denies chest pain or syncope Respiratory: Patient denies shortness of breath Gastrointestinal: Patient denies constipation Musculoskeletal: Patient denies muscle cramps or weakness Neurologic: Patient denies convulsions or seizures Allergic/Immunologic: Patient denies recent allergic reaction(s) Hematologic/Lymphatic: Patient denies bleeding tendencies Endocrine: Patient denies heat/cold intolerance  GU: As per HPI.  OBJECTIVE Vitals:   02/18/23 1122  BP: 129/77  Pulse: (!) 56  Temp: 97.9 F (36.6 C)   There is no height or weight on file to calculate BMI.  Physical Examination Constitutional: No obvious distress; patient is non-toxic appearing  Cardiovascular: No visible lower extremity edema.  Respiratory: The patient does not have audible wheezing/stridor; respirations do not appear labored  Gastrointestinal: Abdomen non-distended Musculoskeletal: Normal ROM of UEs  Skin: No obvious rashes/open sores  Neurologic: CN 2-12 grossly intact Psychiatric: Answered questions appropriately with  normal affect  Hematologic/Lymphatic/Immunologic: No obvious bruises or sites of spontaneous bleeding  Urine microscopy: 11-30 WBC/hpf, 3-10 RBC/hpf, many bacteria PVR: 0 ml  ASSESSMENT OAB (overactive bladder) - Plan: Urinalysis, Routine w reflex microscopic, BLADDER SCAN AMB NON-IMAGING, Vibegron (GEMTESA) 75 MG TABS  UA today appears abnormal however patient is asymptomatic for UTI, therefore acute antibiotic treatment is not advised at this time. The rationale for this was discussed with the patient.    We discussed the symptoms of overactive bladder (OAB), which include urinary urgency, frequency, nocturia, with or without urge incontinence.   While we may not know the exact etiology of OAB, several risk factors can be identified.  - We discussed this patient's neurogenic risk factors for OAB-type symptoms including prior nicotine use and prior stroke.   We discussed the following management options in detail including potential benefits, risks, and side effects: Behavioral therapy: Modify fluid intake Decreasing bladder irritants (such as caffeine) Urge suppression strategies Bladder retraining / timed voiding Double voiding Medication(s): - Not a safe candidate for anticholinergic medications due to risk for side effects based on patient's age, comorbidities, and pre-existing dry mouth / dry eyes. Also previously failed Oxybutynin  & Vesicare. - Previously failed Myrbetriq. Discussed option to trial Gemtesa 75 mg daily. 3. For refractory cases: PTNS (posterior tibial nerve stimulation) Sacral neuromodulation trial (Medtronic lnterStim or Axonics implant) Bladder Botox injections  She decided to proceed with Gemtesa 75 mg daily and work on behavioral modifications including minimizing caffeine intake and working on timed voiding / bladder retraining.  Will plan for follow up in 6 weeks or sooner if needed. Patient verbalized understanding of and agreement with current plan.  All questions were answered.  PLAN Advised the following: 1. Gemtesa 75 mg daily.  2. Minimize caffeine intake. 3. Minimize evening / overnight fluid intake. 4. Work on timed voiding. 5. Return in about 6 weeks (around 04/01/2023) for UA, PVR, & f/u with Evette Georges NP.  Orders Placed This Encounter  Procedures   Urinalysis, Routine w reflex microscopic   BLADDER SCAN AMB NON-IMAGING   It has been explained that the patient is to follow regularly with their PCP in addition to all other providers involved in their care and to follow instructions provided by these respective offices. Patient advised to contact urology clinic if any urologic-pertaining questions, concerns, new symptoms or problems arise in the interim period.  Patient Instructions  Overactive bladder (OAB) overview for patients:  Symptoms may include: urinary urgency ("gotta go" feeling) urinary frequency (voiding >8 times per day) night time urination (nocturia) urge incontinence of urine (UUI)  While we do not know the exact etiology of OAB, several treatment options exist including:  Behavioral therapy: Reducing fluid intake Decreasing bladder stimulants (such as caffeine) and irritants (such as acidic food, spicy foods, alcohol) Urge suppression strategies Bladder retraining via timed voiding  Pelvic floor physical therapy  Medication(s) - can use one or both of the drug classes below. Anticholinergic / antimuscarinic medications:  Mechanism of action: Activate M3 receptors to reduce detrusor stimulation and increase bladder capacity   (parasympathetic nervous system). Effect: Relaxes the bladder to decrease overactivity, increase bladder storage capacity, and increase time between voids. Onset: Slow acting (may take 8-12 weeks to determine efficacy). Medications include: Vesicare (Solifenacin), Ditropan (Oxybutynin), Detrol (Tolterodine), Toviaz (Fesoterodine), Sanctura (Trospium), Urispas  (Flavoxate), Enablex (Darifenacin), Bentyl (Dicyclomine), Levsin (Hyoscyamine ). Potential side effects include but are not limited to: Dry eyes, dry mouth, constipation, cognitive impairment, dementia risk with long term use, and urinary retention/ incomplete bladder emptying. Insurance companies generally prefer for patients to try 1-2 anticholinergic / antimuscarinic medications first due to low cost. Some exceptions are made based on patient-specific comorbidities / risk factors. Beta-3 agonist medications: Mechanism of action: Stimulates selective B3 adrenergic receptors to cause smooth muscle bladder relaxation (sympathetic nervous system). Effect: Relaxes the bladder to decrease overactivity, increase bladder storage capacity, and increase time between voids. Onset: Slow acting (may take 8-12 weeks to determine efficacy). Medications include: Myrbetriq (Mirabegron) and Vibegron Leslye Peer). Potential side effects include but are not limited to: urinary retention / incomplete bladder emptying and elevated blood pressure (more likely to occur in individuals with pre-existing uncontrolled hypertension). These medications tend to be more expensive than the anticholinergic / antimuscarinic medications.   For patients with refractory OAB (if the above treatment options have been unsuccessful): Posterior tibial nerve stimulation (PTNS). Small acupuncture-type needle inserted near ankle with electric current to stimulate bladder via posterior tibial nerve pathway. Initially requires 12 weekly in-office treatments lasting 30 minutes each; followed by monthly in-office treatments lasting 30 minutes each for 1 year.  Bladder Botox injections. How it is done: Typically done via in-office cystoscopy; sometimes done in the OR depending on the situation. The bladder is numbed with lidocaine instilled via a catheter. Then the urologist injects Botox into the bladder muscle wall in about 20 locations. Causes  local paralysis of the bladder muscle at the injection sites to reduce bladder muscle overactivity / spasms. The effect lasts for approximately 6 months and cannot be reversed once performed. Risks may included but are not limited to: infection, incomplete bladder emptying/ urinary retention, short term need for self-catheterization or indwelling catheter, and need for repeat therapy. There is a 5-12% chance of needing to catheterize with Botox - that usually resolves in a few months as the Botox wears off. Typically Botox injections would need to be repeated every 3-12 months since this is not a permanent therapy.  Sacral neuromodulation trial (Medtronic lnterStim or Axonics implant). Sacral neuromodulation is FDA-approved for uncontrolled urinary urgency, urinary frequency, urinary urge incontinence, non-obstructive urinary retention, or fecal incontinence. It is not FDA-approved as a treatment for pain. The goal of this therapy is at least a 50% improvement in symptoms. It is NOT realistic to expect a 100% cure. This is a a 2-step outpatient procedure. After a successful test period, a permanent wire and generator  are placed in the OR. We discussed the risk of infection. We reviewed the fact that about 30% of patients fail the test phase and are not candidates for permanent generator placement. During the 1-2 week trial phase, symptoms are documented by the patient to determine response. If patient gets at least a 50% improvement in symptoms, they may then proceed with Step 2. Step 1: Trial lead placement. Per physician discretion, may done one of two ways: Percutaneous nerve evaluation (PNE) in the [facility] urology office. Performed by urologist under local anesthesia (numbing the area with lidocaine) using a spinal needle for placement of test wire, which usually stays in place for 5-7 days to determine therapy response. Test lead placement in OR under anesthesia. Usually stays in place 2 weeks to  determine therapy response. > Step 2: Permanent implantation of sacral neuromodulation device, which is performed in the OR.  Sacral neuromodulation implants: All are conditionally MRI safe. Manufacturer: Medtronic Website: [employer] therapy/right-for-you.html Options: lnterStim X: Non-rechargeable. The battery lasts 10 years on average. lnterStim Micro: Rechargeable. The battery lasts 15 years on average and must be charged routinely. Approximately 50% smaller implant than lnterStim X implant.  Manufacturer: Axonics Website: Findrealrelief.axonics.com Options: Non-rechargeable (Axonics F15): The battery lasts 15 years on average. Rechargeable (Axonics R20): The battery lasts 20 years on average and must be charged in office for about 1 hour every 6-10 months on average. Approximately 50% smaller implant than Axonics non-rechargeable implant.  Note: Generally the rechargeable devices are only advised for very small or thin patients who may not have sufficient adipose tissue to comfortably overlay the implanted device.  Suprapubic catheter (SP tube) placement. Only done in severely refractory OAB when all other options have failed or are not a viable treatment choice depending on patient factors. Involves placement of a catheter through the lower abdomen into the bladder to continuously drain the bladder into an external collection bag, which patient can then empty at their convenience every few hours. Done via an outpatient surgical procedure in the OR under anesthesia. Risks may included but are not limited to: surgical site pain, infections, skin irritation / breakdown, chronic bacteriuria, symptomatic UTls. The SP tube must stay in place continuously. This is a reversible procedure however - the insertion site will close if catheter is removed for more than a few hours. The SP tube must be exchanged  routinely every 4 weeks to prevent the catheter from becoming clogged with sediment. SP tube exchanges are typically performed at a urology nurse visit or by a home health nurse.   Electronically signed by:  Donnita Falls, MSN, FNP-C, CUNP 02/18/2023 12:23 PM

## 2023-02-18 ENCOUNTER — Ambulatory Visit: Payer: Medicare (Managed Care) | Admitting: Urology

## 2023-02-18 ENCOUNTER — Encounter: Payer: Self-pay | Admitting: Urology

## 2023-02-18 VITALS — BP 129/77 | HR 56 | Temp 97.9°F

## 2023-02-18 DIAGNOSIS — N3281 Overactive bladder: Secondary | ICD-10-CM | POA: Diagnosis not present

## 2023-02-18 LAB — URINALYSIS, ROUTINE W REFLEX MICROSCOPIC
Bilirubin, UA: NEGATIVE
Glucose, UA: NEGATIVE
Ketones, UA: NEGATIVE
Nitrite, UA: POSITIVE — AB
Specific Gravity, UA: 1.02 (ref 1.005–1.030)
Urobilinogen, Ur: 1 mg/dL (ref 0.2–1.0)
pH, UA: 6 (ref 5.0–7.5)

## 2023-02-18 LAB — MICROSCOPIC EXAMINATION

## 2023-02-18 LAB — BLADDER SCAN AMB NON-IMAGING: Scan Result: 0

## 2023-02-18 MED ORDER — GEMTESA 75 MG PO TABS
1.0000 | ORAL_TABLET | Freq: Every day | ORAL | Status: AC
Start: 2023-02-18 — End: ?

## 2023-02-18 NOTE — Patient Instructions (Signed)
Overactive bladder (OAB) overview for patients:  Symptoms may include: urinary urgency ("gotta go" feeling) urinary frequency (voiding >8 times per day) night time urination (nocturia) urge incontinence of urine (UUI)  While we do not know the exact etiology of OAB, several treatment options exist including:  Behavioral therapy: Reducing fluid intake Decreasing bladder stimulants (such as caffeine) and irritants (such as acidic food, spicy foods, alcohol) Urge suppression strategies Bladder retraining via timed voiding  Pelvic floor physical therapy  Medication(s) - can use one or both of the drug classes below. Anticholinergic / antimuscarinic medications:  Mechanism of action: Activate M3 receptors to reduce detrusor stimulation and increase bladder capacity  (parasympathetic nervous system). Effect: Relaxes the bladder to decrease overactivity, increase bladder storage capacity, and increase time between voids. Onset: Slow acting (may take 8-12 weeks to determine efficacy). Medications include: Vesicare (Solifenacin), Ditropan (Oxybutynin), Detrol (Tolterodine), Toviaz (Fesoterodine), Sanctura (Trospium), Urispas (Flavoxate), Enablex (Darifenacin), Bentyl (Dicyclomine), Levsin (Hyoscyamine ). Potential side effects include but are not limited to: Dry eyes, dry mouth, constipation, cognitive impairment, dementia risk with long term use, and urinary retention/ incomplete bladder emptying. Insurance companies generally prefer for patients to try 1-2 anticholinergic / antimuscarinic medications first due to low cost. Some exceptions are made based on patient-specific comorbidities / risk factors. Beta-3 agonist medications: Mechanism of action: Stimulates selective B3 adrenergic receptors to cause smooth muscle bladder relaxation (sympathetic nervous system). Effect: Relaxes the bladder to decrease overactivity, increase bladder storage capacity, and increase time  between voids. Onset: Slow acting (may take 8-12 weeks to determine efficacy). Medications include: Myrbetriq (Mirabegron) and Vibegron Leslye Peer). Potential side effects include but are not limited to: urinary retention / incomplete bladder emptying and elevated blood pressure (more likely to occur in individuals with pre-existing uncontrolled hypertension). These medications tend to be more expensive than the anticholinergic / antimuscarinic medications.   For patients with refractory OAB (if the above treatment options have been unsuccessful): Posterior tibial nerve stimulation (PTNS). Small acupuncture-type needle inserted near ankle with electric current to stimulate bladder via posterior tibial nerve pathway. Initially requires 12 weekly in-office treatments lasting 30 minutes each; followed by monthly in-office treatments lasting 30 minutes each for 1 year.  Bladder Botox injections. How it is done: Typically done via in-office cystoscopy; sometimes done in the OR depending on the situation. The bladder is numbed with lidocaine instilled via a catheter. Then the urologist injects Botox into the bladder muscle wall in about 20 locations. Causes local paralysis of the bladder muscle at the injection sites to reduce bladder muscle overactivity / spasms. The effect lasts for approximately 6 months and cannot be reversed once performed. Risks may included but are not limited to: infection, incomplete bladder emptying/ urinary retention, short term need for self-catheterization or indwelling catheter, and need for repeat therapy. There is a 5-12% chance of needing to catheterize with Botox - that usually resolves in a few months as the Botox wears off. Typically Botox injections would need to be repeated every 3-12 months since this is not a permanent therapy.  Sacral neuromodulation trial (Medtronic lnterStim or Axonics implant). Sacral neuromodulation is FDA-approved for uncontrolled urinary  urgency, urinary frequency, urinary urge incontinence, non-obstructive urinary retention, or fecal incontinence. It is not FDA-approved as a treatment for pain. The goal of this therapy is at least a 50% improvement in symptoms. It is NOT realistic to expect a 100% cure. This is a a 2-step outpatient procedure. After a successful  test period, a permanent wire and generator are placed in the OR. We discussed the risk of infection. We reviewed the fact that about 30% of patients fail the test phase and are not candidates for permanent generator placement. During the 1-2 week trial phase, symptoms are documented by the patient to determine response. If patient gets at least a 50% improvement in symptoms, they may then proceed with Step 2. Step 1: Trial lead placement. Per physician discretion, may done one of two ways: Percutaneous nerve evaluation (PNE) in the [facility] urology office. Performed by urologist under local anesthesia (numbing the area with lidocaine) using a spinal needle for placement of test wire, which usually stays in place for 5-7 days to determine therapy response. Test lead placement in OR under anesthesia. Usually stays in place 2 weeks to determine therapy response. > Step 2: Permanent implantation of sacral neuromodulation device, which is performed in the OR.  Sacral neuromodulation implants: All are conditionally MRI safe. Manufacturer: Medtronic Website: [employer]gl therapy/right-for-you.html Options: lnterStim X: Non-rechargeable. The battery lasts 10 years on average. lnterStim Micro: Rechargeable. The battery lasts 15 years on average and must be charged routinely. Approximately 50% smaller implant than lnterStim X implant.  Manufacturer: Axonics Website: Findrealrelief.axonics.com Options: Non-rechargeable (Axonics F15): The battery lasts 15 years on average. Rechargeable (Axonics R20):  The battery lasts 20 years on average and must be charged in office for about 1 hour every 6-10 months on average. Approximately 50% smaller implant than Axonics non-rechargeable implant.  Note: Generally the rechargeable devices are only advised for very small or thin patients who may not have sufficient adipose tissue to comfortably overlay the implanted device.  Suprapubic catheter (SP tube) placement. Only done in severely refractory OAB when all other options have failed or are not a viable treatment choice depending on patient factors. Involves placement of a catheter through the lower abdomen into the bladder to continuously drain the bladder into an external collection bag, which patient can then empty at their convenience every few hours. Done via an outpatient surgical procedure in the OR under anesthesia. Risks may included but are not limited to: surgical site pain, infections, skin irritation / breakdown, chronic bacteriuria, symptomatic UTls. The SP tube must stay in place continuously. This is a reversible procedure however - the insertion site will close if catheter is removed for more than a few hours. The SP tube must be exchanged routinely every 4 weeks to prevent the catheter from becoming clogged with sediment. SP tube exchanges are typically performed at a urology nurse visit or by a home health nurse.

## 2023-02-28 DIAGNOSIS — R5383 Other fatigue: Secondary | ICD-10-CM | POA: Diagnosis not present

## 2023-02-28 DIAGNOSIS — R110 Nausea: Secondary | ICD-10-CM | POA: Diagnosis not present

## 2023-02-28 DIAGNOSIS — R5381 Other malaise: Secondary | ICD-10-CM | POA: Diagnosis not present

## 2023-02-28 DIAGNOSIS — R42 Dizziness and giddiness: Secondary | ICD-10-CM | POA: Diagnosis not present

## 2023-02-28 DIAGNOSIS — R450 Nervousness: Secondary | ICD-10-CM | POA: Diagnosis not present

## 2023-02-28 DIAGNOSIS — N390 Urinary tract infection, site not specified: Secondary | ICD-10-CM | POA: Diagnosis not present

## 2023-03-26 ENCOUNTER — Other Ambulatory Visit: Payer: Self-pay

## 2023-03-26 ENCOUNTER — Emergency Department (HOSPITAL_COMMUNITY): Payer: Medicare (Managed Care)

## 2023-03-26 ENCOUNTER — Emergency Department (HOSPITAL_COMMUNITY)
Admission: EM | Admit: 2023-03-26 | Discharge: 2023-03-26 | Disposition: A | Discharge status: Home / Self Care | Payer: Medicare (Managed Care) | Attending: Emergency Medicine | Admitting: Emergency Medicine

## 2023-03-26 DIAGNOSIS — Z7901 Long term (current) use of anticoagulants: Secondary | ICD-10-CM | POA: Diagnosis not present

## 2023-03-26 DIAGNOSIS — Z79899 Other long term (current) drug therapy: Secondary | ICD-10-CM | POA: Insufficient documentation

## 2023-03-26 DIAGNOSIS — R0602 Shortness of breath: Secondary | ICD-10-CM | POA: Insufficient documentation

## 2023-03-26 DIAGNOSIS — N3000 Acute cystitis without hematuria: Secondary | ICD-10-CM | POA: Insufficient documentation

## 2023-03-26 DIAGNOSIS — R110 Nausea: Secondary | ICD-10-CM | POA: Diagnosis present

## 2023-03-26 DIAGNOSIS — I10 Essential (primary) hypertension: Secondary | ICD-10-CM | POA: Insufficient documentation

## 2023-03-26 DIAGNOSIS — Z8673 Personal history of transient ischemic attack (TIA), and cerebral infarction without residual deficits: Secondary | ICD-10-CM | POA: Insufficient documentation

## 2023-03-26 LAB — URINALYSIS, ROUTINE W REFLEX MICROSCOPIC
Bilirubin Urine: NEGATIVE
Glucose, UA: NEGATIVE mg/dL
Hgb urine dipstick: NEGATIVE
Ketones, ur: NEGATIVE mg/dL
Nitrite: NEGATIVE
Protein, ur: NEGATIVE mg/dL
Specific Gravity, Urine: 1.012 (ref 1.005–1.030)
pH: 5 (ref 5.0–8.0)

## 2023-03-26 LAB — CBC
HCT: 42.1 % (ref 36.0–46.0)
Hemoglobin: 13.8 g/dL (ref 12.0–15.0)
MCH: 28.3 pg (ref 26.0–34.0)
MCHC: 32.8 g/dL (ref 30.0–36.0)
MCV: 86.3 fL (ref 80.0–100.0)
Platelets: 237 10*3/uL (ref 150–400)
RBC: 4.88 MIL/uL (ref 3.87–5.11)
RDW: 13.6 % (ref 11.5–15.5)
WBC: 13.9 10*3/uL — ABNORMAL HIGH (ref 4.0–10.5)
nRBC: 0 % (ref 0.0–0.2)

## 2023-03-26 LAB — COMPREHENSIVE METABOLIC PANEL
ALT: 12 U/L (ref 0–44)
AST: 24 U/L (ref 15–41)
Albumin: 3.8 g/dL (ref 3.5–5.0)
Alkaline Phosphatase: 69 U/L (ref 38–126)
Anion gap: 12 (ref 5–15)
BUN: 18 mg/dL (ref 8–23)
CO2: 20 mmol/L — ABNORMAL LOW (ref 22–32)
Calcium: 9 mg/dL (ref 8.9–10.3)
Chloride: 106 mmol/L (ref 98–111)
Creatinine, Ser: 1.17 mg/dL — ABNORMAL HIGH (ref 0.44–1.00)
GFR, Estimated: 45 mL/min — ABNORMAL LOW (ref >60)
Glucose, Bld: 118 mg/dL — ABNORMAL HIGH (ref 70–99)
Potassium: 3.9 mmol/L (ref 3.5–5.1)
Sodium: 138 mmol/L (ref 135–145)
Total Bilirubin: 0.3 mg/dL (ref 0.0–1.2)
Total Protein: 7.2 g/dL (ref 6.5–8.1)

## 2023-03-26 LAB — RESP PANEL BY RT-PCR (RSV, FLU A&B, COVID)  RVPGX2
Influenza A by PCR: NEGATIVE
Influenza B by PCR: NEGATIVE
Resp Syncytial Virus by PCR: NEGATIVE
SARS Coronavirus 2 by RT PCR: NEGATIVE

## 2023-03-26 LAB — LIPASE, BLOOD: Lipase: 25 U/L (ref 11–51)

## 2023-03-26 MED ORDER — SULFAMETHOXAZOLE-TRIMETHOPRIM 800-160 MG PO TABS
1.0000 | ORAL_TABLET | Freq: Once | ORAL | Status: AC
  Administered 2023-03-26: 1 via ORAL
  Filled 2023-03-26: qty 1

## 2023-03-26 MED ORDER — ONDANSETRON 4 MG PO TBDP: 4.0000 mg | ORAL_TABLET | Freq: Three times a day (TID) | ORAL | 0 refills | Status: AC | PRN

## 2023-03-26 MED ORDER — ONDANSETRON 4 MG PO TBDP
4.0000 mg | ORAL_TABLET | Freq: Once | ORAL | Status: AC | PRN
  Administered 2023-03-26: 4 mg via ORAL
  Filled 2023-03-26: qty 1

## 2023-03-26 MED ORDER — SULFAMETHOXAZOLE-TRIMETHOPRIM 800-160 MG PO TABS
1.0000 | ORAL_TABLET | Freq: Two times a day (BID) | ORAL | 0 refills | Status: AC
Start: 2023-03-26 — End: 2023-04-02

## 2023-03-26 NOTE — ED Provider Notes (Signed)
 [name] EMERGENCY DEPARTMENT AT [facility] Provider Note   CSN: [id number] Arrival date & time: 03/26/23  1914     History  Chief Complaint  Patient presents with   Nausea   flu like symptoms    [name] is a 89 y.o. female.  Patient with history of stroke causing balance issues, paroxysmal A-fib on Eliquis, hypertension, hyperlipidemia, lives at home and uses walker for ambulation --presents to the emergency department today for evaluation of nausea.  Patient states that she woke in the early morning hours.  She states that she felt very nauseous and had dry heaving.  She called the ambulance later in the morning due to persistent symptoms.  She had some abdominal cramps.  Her symptoms have since resolved and currently she denies symptoms other than feeling jittery.  No fevers noted.  No vomiting or diarrhea reported.  No lower extremity swelling.       Home Medications Prior to Admission medications   Medication Sig Start Date End Date Taking? Authorizing Provider  acetaminophen (TYLENOL) 325 MG tablet Take 1-2 tablets (325-650 mg total) by mouth every 4 (four) hours as needed for mild pain. 12/26/21   Love, Evlyn Kanner, PA-C  amLODipine (NORVASC) 10 MG tablet Take by mouth. 02/08/22   [provider]  apixaban (ELIQUIS) 5 MG TABS tablet Take 1 tablet (5 mg total) by mouth 2 (two) times daily. 12/29/21   Love, Evlyn Kanner, PA-C  atenolol (TENORMIN) 25 MG tablet Take 0.5 tablets (12.5 mg total) by mouth daily with breakfast. 12/29/21   Love, Evlyn Kanner, PA-C  diclofenac Sodium (VOLTAREN) 1 % GEL Apply 2 g topically 4 (four) times daily. 12/29/21   Love, Evlyn Kanner, PA-C  loratadine (CLARITIN) 10 MG tablet Take 10 mg by mouth daily.    [provider]  LORazepam (ATIVAN) 1 MG tablet Take 0.5 tablets (0.5 mg total) by mouth at bedtime. Note medication was decreased and you should have additional pills at home. Patient taking differently: Take 0.5 mg by mouth at  bedtime. Pt taking 1 tab in morning, .5 tab night 12/29/21   Love, Evlyn Kanner, PA-C  losartan (COZAAR) 100 MG tablet Take 1 tablet (100 mg total) by mouth daily with breakfast. 12/29/21   Love, Evlyn Kanner, PA-C  Multiple Vitamin (MULTIVITAMIN PO) Take 1 tablet by mouth daily.    [provider]  omeprazole (PRILOSEC) 40 MG capsule Take 1 capsule (40 mg total) by mouth daily. 12/29/21   Love, Evlyn Kanner, PA-C  rosuvastatin (CRESTOR) 20 MG tablet Take 1 tablet (20 mg total) by mouth daily. 12/29/21   Love, Evlyn Kanner, PA-C  sertraline (ZOLOFT) 100 MG tablet Take 1 tablet (100 mg total) by mouth at bedtime. 12/29/21   Love, Evlyn Kanner, PA-C  sodium chloride (OCEAN) 0.65 % SOLN nasal spray Place 1 spray into both nostrils 2 (two) times daily.    [provider]  Vibegron (GEMTESA) 75 MG TABS Take 1 tablet (75 mg total) by mouth daily. 02/18/23   Donnita Falls, FNP      Allergies    Amoxicillin and Tetracyclines & related    Review of Systems   Review of Systems  Physical Exam Updated Vital Signs BP (!) 126/54 (BP Location: Right Arm)   Pulse 69   Temp 98 F (36.7 C)   Resp 18   Ht 5\' 4"  (1.626 m)   Wt 64.9 kg   SpO2 94%   BMI 24.55 kg/m  Physical Exam Vitals and nursing note reviewed.  Constitutional:      General: She is not in acute distress.    Appearance: She is well-developed.  HENT:     Head: Normocephalic and atraumatic.     Right Ear: External ear normal.     Left Ear: External ear normal.     Nose: Nose normal. No congestion.     Mouth/Throat:     Mouth: Mucous membranes are moist.  Eyes:     Conjunctiva/sclera: Conjunctivae normal.  Cardiovascular:     Rate and Rhythm: Normal rate and regular rhythm.     Heart sounds: No murmur heard. Pulmonary:     Effort: No respiratory distress.     Breath sounds: No wheezing, rhonchi or rales.  Abdominal:     Palpations: Abdomen is soft.     Tenderness: There is no abdominal tenderness. There is no guarding or  rebound.  Musculoskeletal:     Cervical back: Normal range of motion and neck supple.     Right lower leg: No edema.     Left lower leg: No edema.  Skin:    General: Skin is warm and dry.     Findings: No rash.  Neurological:     General: No focal deficit present.     Mental Status: She is alert. Mental status is at baseline.     Motor: No weakness.  Psychiatric:        Mood and Affect: Mood normal.     ED Results / Procedures / Treatments   Labs (all labs ordered are listed, but only abnormal results are displayed) Labs Reviewed  COMPREHENSIVE METABOLIC PANEL - Abnormal; Notable for the following components:      Result Value   CO2 20 (*)    Glucose, Bld 118 (*)    Creatinine, Ser 1.17 (*)    GFR, Estimated 45 (*)    All other components within normal limits  CBC - Abnormal; Notable for the following components:   WBC 13.9 (*)    All other components within normal limits  RESP PANEL BY RT-PCR (RSV, FLU A&B, COVID)  RVPGX2  LIPASE, BLOOD  URINALYSIS, ROUTINE W REFLEX MICROSCOPIC    ED ECG REPORT   Date: 03/26/2023  Rate: 66  Rhythm: normal sinus rhythm  QRS Axis: normal  Intervals: normal  ST/T Wave abnormalities: nonspecific T wave changes  Conduction Disutrbances:none  Narrative Interpretation:   Old EKG Reviewed: Changes noted, T waves flatter today compared to June 2024  I have personally reviewed the EKG tracing and agree with the computerized printout as noted.   Radiology DG Chest 2 View Result Date: 03/26/2023 CLINICAL DATA:  Shortness of breath.  Flu like symptoms EXAM: CHEST - 2 VIEW COMPARISON:  X-ray 06/18/2022 FINDINGS: Hyperinflation. Interstitial prominence is likely chronic. No consolidation, pneumothorax, effusion or edema. Normal cardiopericardial silhouette. Tortuous aorta. Osteopenia with degenerative changes. There is severe compression of the lower thoracic spine vertebral level, unchanged from previous. IMPRESSION: Hyperinflation with chronic  changes. Stable lower thoracic spine vertebral compression. Electronically Signed   By: Karen Kays M.D.   On: 03/26/2023 11:50    Procedures Procedures    Medications Ordered in ED Medications  ondansetron (ZOFRAN-ODT) disintegrating tablet 4 mg (4 mg Oral Given 03/26/23 0919)    ED Course/ Medical Decision Making/ A&P    Patient seen and examined. History obtained directly from patient. Work-up including labs, imaging, EKG ordered in triage, if performed, were reviewed.  Labs/EKG: Independently reviewed and interpreted.  This included: CBC showing elevated white blood cell count otherwise unremarkable; CMP unremarkable; lipase normal; respiratory panel negative.   Imaging: Independently visualized and interpreted.  This included: Chest x-ray agree negative  Medications/Fluids: None ordered  Most recent vital signs reviewed and are as follows: BP (!) 126/54 (BP Location: Right Arm)   Pulse 69   Temp 98 F (36.7 C)   Resp 18   Ht 5\' 4"  (1.626 m)   Wt 64.9 kg   SpO2 94%   BMI 24.55 kg/m   Initial impression: Episode of nausea this morning, now resolved.  Reassuring exam.  Labs reassuring.  Awaiting UA and p.o. challenge.  5:25 PM Reassessment performed. Patient appears well.  She is tolerating oral fluids.  Labs personally reviewed and interpreted including: UA with some signs of UTI.  Review of records show recent ESBL UTI, treated with Macrobid.  Also susceptible to Bactrim.  Will place patient on course of Bactrim.  First dose here.  Urine culture ordered.  Reviewed pertinent lab work and imaging with patient at bedside. Questions answered.   Most current vital signs reviewed and are as follows: BP (!) 126/54 (BP Location: Right Arm)   Pulse 69   Temp 98 F (36.7 C)   Resp 18   Ht 5\' 4"  (1.626 m)   Wt 64.9 kg   SpO2 94%   BMI 24.55 kg/m   Plan: Discharge to home.   Prescriptions written for: Bactrim, Zofran  Other home care instructions discussed: Bland  diet, maintain good hydration  ED return instructions discussed: Worsening abdominal pain, persistent vomiting, fever  Follow-up instructions discussed: Patient encouraged to follow-up with their PCP in 3 days.                                Medical Decision Making Amount and/or Complexity of Data Reviewed Labs: ordered.  Risk Prescription drug management.   Patient presents today mainly for severe nausea and some abdominal pain, resolved now.  No chest pain or shortness of breath reported.  Workup is reassuring.  Mild elevation white count.  Question of ongoing UTI.  This may be contributing.  Low concern for ACS.  Chest x-ray is clear without signs of pneumonia.  Negative viral panel.  Patient tolerating p.o.'s.  Will be given a course of Bactrim.  For this patient's complaint of abdominal pain, the following conditions were considered on the differential diagnosis: gastritis/PUD, enteritis/duodenitis, appendicitis, cholelithiasis/cholecystitis, cholangitis, pancreatitis, ruptured viscus, colitis, diverticulitis, small/large bowel obstruction, proctitis, cystitis, pyelonephritis, ureteral colic, aortic dissection, aortic aneurysm. In women, pelvic inflammatory disease, ovarian cysts, and tubo-ovarian abscess were also considered. Atypical chest etiologies were also considered including ACS, PE, and pneumonia.  Patient looks very well, vitals are stable.  Plan for discharge to home.  No indication for hospitalization at this time.  The patient's vital signs, pertinent lab work and imaging were reviewed and interpreted as discussed in the ED course. Hospitalization was considered for further testing, treatments, or serial exams/observation. However as patient is well-appearing, has a stable exam, and reassuring studies today, I do not feel that they warrant admission at this time. This plan was discussed with the patient who verbalizes agreement and comfort with this plan and seems reliable and  able to return to the Emergency Department with worsening or changing symptoms.  She seems to have reasonable family support.  Final Clinical Impression(s) / ED Diagnoses Final diagnoses:  Nausea  Acute cystitis without hematuria    Rx / DC Orders ED Discharge Orders          Ordered    ondansetron (ZOFRAN-ODT) 4 MG disintegrating tablet  Every 8 hours PRN        03/26/23 1722    sulfamethoxazole-trimethoprim (BACTRIM DS) 800-160 MG tablet  2 times daily        03/26/23 1722              Renne Crigler, PA-C 03/26/23 1728    Horton, Clabe Seal, DO 03/26/23 2325

## 2023-03-26 NOTE — ED Triage Notes (Signed)
 Pt BIBGEMS from home with flu like symptoms starting today at 0200. Pt has nausea and vomiting, cough, runny nose. Pt alert and oriented, hard of hearing, lives alone and walks with a walker.   132/64 90 hr 95% RA  113 BG

## 2023-03-26 NOTE — Discharge Instructions (Addendum)
 Please read and follow all provided instructions.  Your diagnoses today include:  1. Nausea   2. Acute cystitis without hematuria     Tests performed today include: Complete blood cell count: White blood cell count was a little high otherwise unremarkable Complete metabolic panel: No problems Urinalysis (urine test): Suggest possible ongoing urinary infection Urine culture sent and pending Chest x-ray without pneumonia Viral panel negative for flu, COVID, RSV Vital signs. See below for your results today.   Medications prescribed:  Zofran (ondansetron) - for nausea and vomiting  Bactrim (trimethoprim/sulfamethoxazole) - antibiotic  You have been prescribed an antibiotic medicine: take the entire course of medicine even if you are feeling better. Stopping early can cause the antibiotic not to work.  Take any prescribed medications only as directed.  Home care instructions:  Follow any educational materials contained in this packet.  Keep drinking plenty of fluids, bland diet if you feel nauseous.  Follow-up instructions: Please follow-up with your primary care provider in the next 3 days for further evaluation of your symptoms.   Return instructions:  Please return to the Emergency Department if you experience worsening symptoms.  Return with fever, persistent vomiting Please return if you have any other emergent concerns.  Additional Information:  Your vital signs today were: BP (!) 126/54 (BP Location: Right Arm)   Pulse 69   Temp 98 F (36.7 C)   Resp 18   Ht 5\' 4"  (1.626 m)   Wt 64.9 kg   SpO2 94%   BMI 24.55 kg/m  If your blood pressure (BP) was elevated above 135/85 this visit, please have this repeated by your doctor within one month. --------------

## 2023-03-26 NOTE — ED Provider Triage Note (Signed)
 Emergency Medicine Provider Triage Evaluation Note  [name] , a 89 y.o. female  was evaluated in triage.  Pt complains of chills, nausea, dry heaving, urinary urgency, congestion, runny nose, cough, malaise and fatigue.  Review of Systems  Positive: Productive cough, nausea, urinary urgency, congestion, rhinorrhea, chills, fatigue Negative: Not reporting chest pain, shortness of breath, abdominal pain, back pain, flank pain, extremity symptoms  Physical Exam  BP (!) 133/108 (BP Location: Right Arm)   Pulse 89   Temp 98.5 F (36.9 C)   Resp 19   Ht 5\' 4"  (1.626 m)   Wt 64.9 kg   SpO2 96%   BMI 24.55 kg/m  Gen:   Awake, no distress   Resp:  Normal effort , clear breath sounds bilaterally MSK:   Moves extremities without difficulty  Other:  Nontender chest or abdomen.  Did not appreciate a murmur on my auscultation.  Has congestion and rhinorrhea.  Medical Decision Making  Medically screening exam initiated at 9:41 AM.  Appropriate orders placed.  AUDRIE KURI was informed that the remainder of the evaluation will be completed by another provider, this initial triage assessment does not replace that evaluation, and the importance of remaining in the ED until their evaluation is complete.  TAKINA BUSSER is a 89 y.o. female with a past medical history significant for previous stroke, vertigo, hyperlipidemia, hypertension, anxiety, previous cholecystectomy, previous hysterectomy, previous carotid endarterectomy, diverticulitis, and atrial fibrillation on Eliquis therapy who presents with subjective fevers, chills, productive cough, rhinorrhea, congestion, nausea, dry heaving, dysuria, malaise, and fatigue.  Patient's symptoms been going on for last few days.  She did not report any shortness of the chest pain or abdominal pain to me.  She is just feeling miserable and having this runny nose and congestion and cough.  On exam, lungs were clear and chest was nontender.  Abdomen nontender.   Vital signs did not show hypoxia, tachycardia or tachypnea.  She was afebrile here.  Clinically I suspect viral URI such as the flu or COVID.  Patient will get screening labs and will get a chest x-ray.  Will swab her nose for viral illness.  Anticipate assessment when she gets into an exam room.      Cherene Dobbins, Canary Brim, MD 03/26/23 [phone]

## 2023-03-29 LAB — URINE CULTURE: Culture: 30000 — AB

## 2023-03-30 ENCOUNTER — Telehealth (HOSPITAL_BASED_OUTPATIENT_CLINIC_OR_DEPARTMENT_OTHER): Payer: Self-pay | Admitting: *Deleted

## 2023-03-30 NOTE — Telephone Encounter (Signed)
 Post ED Visit - Positive Culture Follow-up  Culture report reviewed by antimicrobial stewardship pharmacist: [facility] []  Enzo Bi, Pharm.D. []  Celedonio Miyamoto, Pharm.D., BCPS AQ-ID []  Garvin Fila, Pharm.D., BCPS []  Georgina Pillion, Pharm.D., BCPS []  [place], [place].D., BCPS, AAHIVP []  Estella Husk, Pharm.D., BCPS, AAHIVP []  Lysle Pearl, PharmD, BCPS []  Phillips Climes, PharmD, BCPS []  Agapito Games, PharmD, BCPS []  Verlan Friends, PharmD []  Mervyn Gay, PharmD, BCPS [x]  Estill Batten, PharmD  [facility] []  Len Childs, PharmD []  Greer Pickerel, PharmD []  Adalberto Cole, PharmD []  Perlie Gold, Rph []  Lonell Face) Jean Rosenthal, PharmD []  Earl Many, PharmD []  Junita Push, PharmD []  Dorna Leitz, PharmD []  Terrilee Files, PharmD []  Lynann Beaver, PharmD []  Keturah Barre, PharmD []  Loralee Pacas, PharmD []  Bernadene Person, PharmD   Positive urine culture Treated with Sulfamethoazole-Trimethoprim, organism sensitive to the same and no further patient follow-up is required at this time.  Patsey Berthold 03/30/2023, 11:28 AM

## 2023-04-02 ENCOUNTER — Ambulatory Visit: Payer: Medicare (Managed Care) | Admitting: Urology

## 2023-04-03 ENCOUNTER — Ambulatory Visit: Payer: Medicare (Managed Care) | Admitting: Urology

## 2023-07-08 DIAGNOSIS — J439 Emphysema, unspecified: Secondary | ICD-10-CM | POA: Diagnosis not present

## 2023-07-08 DIAGNOSIS — I10 Essential (primary) hypertension: Secondary | ICD-10-CM | POA: Diagnosis not present

## 2023-07-08 DIAGNOSIS — I480 Paroxysmal atrial fibrillation: Secondary | ICD-10-CM | POA: Diagnosis not present

## 2023-07-08 DIAGNOSIS — J441 Chronic obstructive pulmonary disease with (acute) exacerbation: Secondary | ICD-10-CM | POA: Diagnosis not present

## 2023-07-19 DIAGNOSIS — K219 Gastro-esophageal reflux disease without esophagitis: Secondary | ICD-10-CM | POA: Diagnosis not present

## 2023-07-19 DIAGNOSIS — N183 Chronic kidney disease, stage 3 unspecified: Secondary | ICD-10-CM | POA: Diagnosis not present

## 2023-07-19 DIAGNOSIS — Z0000 Encounter for general adult medical examination without abnormal findings: Secondary | ICD-10-CM | POA: Diagnosis not present

## 2023-07-19 DIAGNOSIS — I10 Essential (primary) hypertension: Secondary | ICD-10-CM | POA: Diagnosis not present

## 2023-07-19 DIAGNOSIS — I6523 Occlusion and stenosis of bilateral carotid arteries: Secondary | ICD-10-CM | POA: Diagnosis not present

## 2023-07-19 DIAGNOSIS — E782 Mixed hyperlipidemia: Secondary | ICD-10-CM | POA: Diagnosis not present

## 2023-07-19 DIAGNOSIS — I700 Atherosclerosis of aorta: Secondary | ICD-10-CM | POA: Diagnosis not present

## 2023-07-19 DIAGNOSIS — I2510 Atherosclerotic heart disease of native coronary artery without angina pectoris: Secondary | ICD-10-CM | POA: Diagnosis not present

## 2023-07-19 DIAGNOSIS — D6869 Other thrombophilia: Secondary | ICD-10-CM | POA: Diagnosis not present

## 2023-07-19 DIAGNOSIS — I480 Paroxysmal atrial fibrillation: Secondary | ICD-10-CM | POA: Diagnosis not present

## 2023-07-19 DIAGNOSIS — J439 Emphysema, unspecified: Secondary | ICD-10-CM | POA: Diagnosis not present

## 2023-07-19 DIAGNOSIS — F331 Major depressive disorder, recurrent, moderate: Secondary | ICD-10-CM | POA: Diagnosis not present

## 2023-07-22 DIAGNOSIS — I10 Essential (primary) hypertension: Secondary | ICD-10-CM | POA: Diagnosis not present

## 2023-07-22 DIAGNOSIS — J441 Chronic obstructive pulmonary disease with (acute) exacerbation: Secondary | ICD-10-CM | POA: Diagnosis not present

## 2023-07-22 DIAGNOSIS — I480 Paroxysmal atrial fibrillation: Secondary | ICD-10-CM | POA: Diagnosis not present

## 2023-07-22 DIAGNOSIS — J439 Emphysema, unspecified: Secondary | ICD-10-CM | POA: Diagnosis not present

## 2023-08-06 DIAGNOSIS — J441 Chronic obstructive pulmonary disease with (acute) exacerbation: Secondary | ICD-10-CM | POA: Diagnosis not present

## 2023-08-06 DIAGNOSIS — J439 Emphysema, unspecified: Secondary | ICD-10-CM | POA: Diagnosis not present

## 2023-08-06 DIAGNOSIS — I10 Essential (primary) hypertension: Secondary | ICD-10-CM | POA: Diagnosis not present

## 2023-08-06 DIAGNOSIS — I480 Paroxysmal atrial fibrillation: Secondary | ICD-10-CM | POA: Diagnosis not present

## 2023-08-07 ENCOUNTER — Ambulatory Visit: Payer: Medicare (Managed Care) | Admitting: Family Medicine

## 2023-08-14 DIAGNOSIS — Z0100 Encounter for examination of eyes and vision without abnormal findings: Secondary | ICD-10-CM | POA: Diagnosis not present

## 2023-08-14 DIAGNOSIS — H43813 Vitreous degeneration, bilateral: Secondary | ICD-10-CM | POA: Diagnosis not present

## 2023-08-14 DIAGNOSIS — H04123 Dry eye syndrome of bilateral lacrimal glands: Secondary | ICD-10-CM | POA: Diagnosis not present

## 2023-08-22 DIAGNOSIS — J441 Chronic obstructive pulmonary disease with (acute) exacerbation: Secondary | ICD-10-CM | POA: Diagnosis not present

## 2023-08-22 DIAGNOSIS — I129 Hypertensive chronic kidney disease with stage 1 through stage 4 chronic kidney disease, or unspecified chronic kidney disease: Secondary | ICD-10-CM | POA: Diagnosis not present

## 2023-08-22 DIAGNOSIS — I10 Essential (primary) hypertension: Secondary | ICD-10-CM | POA: Diagnosis not present

## 2023-08-22 DIAGNOSIS — F411 Generalized anxiety disorder: Secondary | ICD-10-CM | POA: Diagnosis not present

## 2023-08-22 DIAGNOSIS — I480 Paroxysmal atrial fibrillation: Secondary | ICD-10-CM | POA: Diagnosis not present

## 2023-08-22 DIAGNOSIS — J439 Emphysema, unspecified: Secondary | ICD-10-CM | POA: Diagnosis not present

## 2023-08-22 DIAGNOSIS — M1990 Unspecified osteoarthritis, unspecified site: Secondary | ICD-10-CM | POA: Diagnosis not present

## 2023-08-22 DIAGNOSIS — N183 Chronic kidney disease, stage 3 unspecified: Secondary | ICD-10-CM | POA: Diagnosis not present

## 2023-09-05 DIAGNOSIS — J439 Emphysema, unspecified: Secondary | ICD-10-CM | POA: Diagnosis not present

## 2023-09-05 DIAGNOSIS — I480 Paroxysmal atrial fibrillation: Secondary | ICD-10-CM | POA: Diagnosis not present

## 2023-09-05 DIAGNOSIS — J441 Chronic obstructive pulmonary disease with (acute) exacerbation: Secondary | ICD-10-CM | POA: Diagnosis not present

## 2023-09-05 DIAGNOSIS — I10 Essential (primary) hypertension: Secondary | ICD-10-CM | POA: Diagnosis not present

## 2023-09-20 ENCOUNTER — Encounter: Payer: Self-pay | Admitting: Physical Therapy

## 2023-09-20 ENCOUNTER — Ambulatory Visit: Payer: Medicare (Managed Care) | Attending: Family Medicine | Admitting: Physical Therapy

## 2023-09-20 DIAGNOSIS — R2681 Unsteadiness on feet: Secondary | ICD-10-CM | POA: Insufficient documentation

## 2023-09-20 DIAGNOSIS — Z9181 History of falling: Secondary | ICD-10-CM | POA: Diagnosis not present

## 2023-09-20 DIAGNOSIS — M6281 Muscle weakness (generalized): Secondary | ICD-10-CM | POA: Insufficient documentation

## 2023-09-20 DIAGNOSIS — R278 Other lack of coordination: Secondary | ICD-10-CM | POA: Insufficient documentation

## 2023-09-20 DIAGNOSIS — R293 Abnormal posture: Secondary | ICD-10-CM | POA: Insufficient documentation

## 2023-09-20 NOTE — Therapy (Signed)
 OUTPATIENT PHYSICAL THERAPY NEURO EVALUATION   Patient Name: [name] MRN: [id number] DOB:1934/09/20, 89 y.o., female Today's Date: 09/20/2023   PCP: Auston Opal, DO REFERRING PROVIDER: Auston Opal, DO  END OF SESSION:  PT End of Session - 09/20/23 1449     Visit Number 1    Number of Visits 13    Date for PT Re-Evaluation 11/08/23    Authorization Type [employer] Advantage    PT Start Time 1445    PT Stop Time 1518    PT Time Calculation (min) 33 min    Activity Tolerance Patient tolerated treatment well    Behavior During Therapy WFL for tasks assessed/performed          Past Medical History:  Diagnosis Date   Arthritis    Atrial fibrillation (HCC)    Carotid artery occlusion    Colitis    COPD (chronic obstructive pulmonary disease) (HCC)    Diverticulitis    Hypertension    Keratosis, seborrheic    Lumbar spondylosis    w/impingement L3-S1   Past Surgical History:  Procedure Laterality Date   ABDOMINAL HYSTERECTOMY     BREAST EXCISIONAL BIOPSY Left    BREAST EXCISIONAL BIOPSY Left    BREAST EXCISIONAL BIOPSY Right    BREAST SURGERY     cyst removal   CAROTID ENDARTERECTOMY Left 2010   CHOLECYSTECTOMY     EYE SURGERY Bilateral 06/2014   cataracts   JOINT REPLACEMENT     RIGHT KNEE   REPLACEMENT TOTAL KNEE Right    Patient Active Problem List   Diagnosis Date Noted   Cerebellar stroke (HCC) 12/20/2021   Vertigo 12/14/2021   CVA (cerebral vascular accident) (HCC) 12/14/2021   Hypokalemia 12/14/2021   Leukocytosis 12/14/2021   Essential hypertension 12/14/2021   Anxiety 12/14/2021   Hyperlipidemia 12/14/2021   Carotid artery disease (HCC) 12/14/2021   Osteoarthritis cervical spine 09/21/2020   Compression fracture of T12 vertebra (HCC) 04/16/2018   Paroxysmal atrial fibrillation (HCC) 03/03/2018   Occlusion and stenosis of carotid artery without mention of cerebral infarction 02/05/2012    ONSET DATE: 08/13/2023  (referral)  REFERRING DIAG: R26.89 (ICD-10-CM) - Other abnormalities of gait and mobility  THERAPY DIAG:  Unsteadiness on feet  Muscle weakness (generalized)  Other lack of coordination  History of falling  Abnormal posture  Rationale for Evaluation and Treatment: Rehabilitation  SUBJECTIVE:                                                                                                                                                                                             SUBJECTIVE STATEMENT:  Pt presents w/RW, pt reports her balance is terrible, it is worse than it's ever been. It is my own stupid fault because at one time I could tell I was getting better. Has had one fall out of the bed about a month ago, had to call EMS to get up. Has not been working on her exercises and does not walk much during the day. Pt reports she is still driving and able to take care of herself, but does get support from her family.   Pt accompanied by: Daughter in Social worker, Melinda   PERTINENT HISTORY: A-fib, COPD, hypertension, stroke  PAIN:  Are you having pain? No Pt reports frequent neck pain 2/2 arthritis   PRECAUTIONS: Fall  RED FLAGS: None   WEIGHT BEARING RESTRICTIONS: No  FALLS: Has patient fallen in last 6 months? Yes. Number of falls 1  LIVING ENVIRONMENT: Lives with: lives alone Lives in: House/apartment Stairs: Yes: External: 1 in back and 3 in front steps; on right going up, on left going up, and can reach both Has following equipment at home: Single point cane, Walker - 2 wheeled, shower chair, and Grab bars  PLOF: Requires assistive device for independence, Leisure: reading, playing games on the computer, and is driving  PATIENT GOALS: my balance   OBJECTIVE:  Note: Objective measures were completed at Evaluation unless otherwise noted.  DIAGNOSTIC FINDINGS: CTA of head/neck from 11/2021  IMPRESSION: 1. Negative for large vessel occlusion, but Positive for  complex plaque at the Left ICA origin where it's difficult to exclude ADHERENT THROMBUS within the vessel lumen (series 12, image 87, but alternatively this appearance might be ulcerated plaque and/or an unusual carotid web. Subsequent stenosis numerically estimated at 60%.   2. Positive also for evidence of cervical Right ICA Fibromuscular Dysplasia (FMD).   3. But otherwise mild for age atherosclerosis, and essentially negative intracranial anterior circulation with no other arterial stenosis identified.   4. Stable CT appearance of the brain, No acute intracranial abnormality.   5. Aortic Atherosclerosis (ICD10-I70.0) and Emphysema (ICD10-J43.9). Tortuous aortic arch.   6. 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  COGNITION: Overall cognitive status: Within functional limits for tasks assessed   SENSATION: Pt reports bilateral peripheral neuropathy    EDEMA: Pt reports frequent swelling     POSTURE: rounded shoulders, forward head, and increased thoracic kyphosis  LOWER EXTREMITY ROM:     Active  Right Eval Left Eval  Hip flexion    Hip extension    Hip abduction    Hip adduction    Hip internal rotation    Hip external rotation    Knee flexion    Knee extension    Ankle dorsiflexion    Ankle plantarflexion    Ankle inversion    Ankle eversion     (Blank rows = not tested)  LOWER EXTREMITY MMT:  Tested in seated position   MMT Right Eval Left Eval  Hip flexion 4- 4  Hip extension    Hip abduction 4+ 4  Hip adduction 4 4-  Hip internal rotation    Hip external rotation    Knee flexion 5 4+  Knee extension 5 4+  Ankle dorsiflexion 5 5  Ankle plantarflexion    Ankle inversion    Ankle eversion    (Blank rows = not tested)  BED MOBILITY:  Not tested Pt reports difficulty due to high height of bed. Wants to get rid of her headboard but is unable to do this  on her own   TRANSFERS: Sit to stand: SBA   Assistive device utilized: Environmental consultant - 2 wheeled     Stand to sit: SBA  Assistive device utilized: Environmental consultant - 2 wheeled     Bilateral genu valgus, weight shift to R side, heavy BUE reliance   RAMP:  Not tested  CURB:  Not tested  STAIRS: Not tested GAIT: Gait pattern: step through pattern, decreased step length- Right, decreased stride length, decreased hip/knee flexion- Right, decreased ankle dorsiflexion- Right, lateral hip instability, trunk flexed, and poor foot clearance- Right Distance walked: Various clinic distances  Assistive device utilized: Environmental consultant - 2 wheeled Level of assistance: SBA Comments: Min cues to stay close to RW to facilitate upright posture and to stay inside RW when turning    FUNCTIONAL TESTS:   [facility] PT Assessment - 09/20/23 1510       Transfers   Five time sit to stand comments  17.6s w/BUE support      Balance   Balance Assessed Yes      Standardized Balance Assessment   Standardized Balance Assessment Timed Up and Go Test;10 meter walk test    10 Meter Walk 0.65 m/s w/RW   37m over 15.32s     Timed Up and Go Test   Normal TUG (seconds) 26.59   w/RW, mod verbl cues to perform task                                                                                                                                     TREATMENT:   Self-care  Educated pt on importance of exercise and resistance training to maintain functional strength and independence. Recommenced pt make an exercise schedule to stay consistent w/her HEP and make it part of her daily routine.   PATIENT EDUCATION: Education details: POC, eval findings, see self-care above  Person educated: Patient and DIL Education method: Explanation Education comprehension: verbalized understanding and needs further education  HOME EXERCISE PROGRAM: To be reviewed from previous POC: [id number]   GOALS: Goals reviewed with patient? Yes  SHORT TERM GOALS: Target date: 10/18/2023   Pt will be  independent with initial HEP for improved strength, balance, transfers and gait.  Baseline: to be reviewed/updated from previous POC  Goal status: INITIAL  2.  Pt will improve 5 x STS to less than or equal to 16 seconds w/improved anterior weight shift to demonstrate improved functional strength and transfer efficiency.   Baseline: 17.6s w/BUE support and poor body mechanics  Goal status: INITIAL  3.  Pt will improve gait velocity to at least 0.72 m/s w/LRAD for improved gait efficiency and independence   Baseline: 0.65 m/s w/RW  Goal status: INITIAL  4.  Pt will improve normal TUG to less than or equal to 23 seconds w/LRAD for improved functional mobility and decreased fall risk.  Baseline: 26.59s w/RW Goal status: INITIAL  LONG TERM GOALS: Target date:  11/01/2023   Pt will be independent with final HEP for improved strength, balance, transfers and gait.  Baseline:  Goal status: INITIAL  2.  Pt will improve 5 x STS to less than or equal to 15 seconds w/proper body mechanics to demonstrate improved functional strength and transfer efficiency.   Baseline: 17.6s w/improper body mechanics  Goal status: INITIAL  3.  Pt will improve gait velocity to at least 0.51m/s w/LRAD for improved gait efficiency and reduced fall risk   Baseline: 0.65 m/s wRW Goal status: INITIAL  4.  Pt will improve normal TUG to less than or equal to 20 seconds w/LRAD for improved functional mobility and decreased fall risk.  Baseline: 26.59s w/RW  Goal status: INITIAL  5.  Pt will perform floor transfer w/min A for improved functional strength and fall recovery at home  Baseline:  Goal status: INITIAL   ASSESSMENT:  CLINICAL IMPRESSION: Patient is a 89 year old female referred to Neuro OPPT for abnormalities of gait and mobility. Pt's PMH is significant for: A-fib, COPD, hypertension, stroke. The following deficits were present during the exam: decreased functional strength, cardiovascular  deconditioning, improper body mechanics and impaired sensation. Based on falls history, gait speed and TUG, pt is an incr risk for falls. Pt would benefit from skilled PT to address these impairments and functional limitations to maximize functional mobility independence.    OBJECTIVE IMPAIRMENTS: Abnormal gait, cardiopulmonary status limiting activity, decreased activity tolerance, decreased balance, decreased cognition, decreased endurance, decreased knowledge of condition, decreased knowledge of use of DME, decreased mobility, difficulty walking, decreased strength, decreased safety awareness, impaired perceived functional ability, impaired sensation, improper body mechanics, and pain  ACTIVITY LIMITATIONS: carrying, lifting, bending, standing, squatting, stairs, transfers, bed mobility, locomotion level, and caring for others  PARTICIPATION LIMITATIONS: meal prep, cleaning, laundry, driving, shopping, community activity, and yard work  PERSONAL FACTORS: Fitness, Transportation, and 1-2 comorbidities: Hx of CVA are also affecting patient's functional outcome.   REHAB POTENTIAL: Good  CLINICAL DECISION MAKING: Evolving/moderate complexity  EVALUATION COMPLEXITY: Moderate  PLAN:  PT FREQUENCY: 2x/week  PT DURATION: 6 weeks  PLANNED INTERVENTIONS: 02835- PT Re-evaluation, 97750- Physical Performance Testing, 97110-Therapeutic exercises, 97530- Therapeutic activity, 97112- Neuromuscular re-education, 97535- Self Care, 02859- Manual therapy, [phone]- Gait training, [phone]- Aquatic Therapy, [phone]- Electrical stimulation (manual), [phone] (1-2 muscles), [phone] (3+ muscles)- Dry Needling, Patient/Family education, Balance training, Stair training, Joint mobilization, Spinal mobilization, Vestibular training, and DME instructions  PLAN FOR NEXT SESSION: Review HEP from previous POC and revise as needed (pt will need to work on more functional strength tasks- sit to stands, standing marches, side  stepping, etc.). Work on turns!! Upright posture, endurance    Gean Laursen E Ellen Mayol, PT, DPT 09/20/2023, 3:22 PM

## 2023-09-22 DIAGNOSIS — I10 Essential (primary) hypertension: Secondary | ICD-10-CM | POA: Diagnosis not present

## 2023-09-22 DIAGNOSIS — J439 Emphysema, unspecified: Secondary | ICD-10-CM | POA: Diagnosis not present

## 2023-09-22 DIAGNOSIS — J441 Chronic obstructive pulmonary disease with (acute) exacerbation: Secondary | ICD-10-CM | POA: Diagnosis not present

## 2023-09-22 DIAGNOSIS — I480 Paroxysmal atrial fibrillation: Secondary | ICD-10-CM | POA: Diagnosis not present

## 2023-10-02 ENCOUNTER — Telehealth: Payer: Self-pay | Admitting: Physical Therapy

## 2023-10-02 ENCOUNTER — Ambulatory Visit: Payer: Medicare (Managed Care) | Attending: Family Medicine | Admitting: Physical Therapy

## 2023-10-02 ENCOUNTER — Encounter: Payer: Self-pay | Admitting: Physical Therapy

## 2023-10-02 DIAGNOSIS — R293 Abnormal posture: Secondary | ICD-10-CM | POA: Diagnosis not present

## 2023-10-02 DIAGNOSIS — R278 Other lack of coordination: Secondary | ICD-10-CM | POA: Insufficient documentation

## 2023-10-02 DIAGNOSIS — R2681 Unsteadiness on feet: Secondary | ICD-10-CM | POA: Insufficient documentation

## 2023-10-02 DIAGNOSIS — M6281 Muscle weakness (generalized): Secondary | ICD-10-CM | POA: Diagnosis not present

## 2023-10-02 DIAGNOSIS — R29898 Other symptoms and signs involving the musculoskeletal system: Secondary | ICD-10-CM | POA: Insufficient documentation

## 2023-10-02 DIAGNOSIS — Z9181 History of falling: Secondary | ICD-10-CM | POA: Diagnosis not present

## 2023-10-02 DIAGNOSIS — R269 Unspecified abnormalities of gait and mobility: Secondary | ICD-10-CM | POA: Diagnosis not present

## 2023-10-02 NOTE — Telephone Encounter (Signed)
 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  The patient would benefit from OT evaluation for fine motor changes particularly with handwriting and UE tremors impacting coordination.   If you agree, please place an order in [facility] workque in [facility] or fax the order to [phone].  Thank you,  Daved Bull, PT, DPT  [facility] [address] [place], [place]  [address] Phone:  [phone] Fax:  [phone]

## 2023-10-02 NOTE — Therapy (Signed)
 OUTPATIENT PHYSICAL THERAPY NEURO TREATMENT   Patient Name: [name] MRN: [id number] DOB:[birth year], 89 y.o., female Today's Date: 10/02/2023   PCP: Auston Opal, DO REFERRING PROVIDER: Auston Opal, DO  END OF SESSION:  PT End of Session - 10/02/23 1539     Visit Number 2    Number of Visits 13    Date for PT Re-Evaluation 11/08/23    Authorization Type [employer] Advantage    PT Start Time 1532    PT Stop Time 1614    PT Time Calculation (min) 42 min    Equipment Utilized During Treatment Gait belt    Activity Tolerance Patient tolerated treatment well    Behavior During Therapy WFL for tasks assessed/performed          Past Medical History:  Diagnosis Date   Arthritis    Atrial fibrillation (HCC)    Carotid artery occlusion    Colitis    COPD (chronic obstructive pulmonary disease) (HCC)    Diverticulitis    Hypertension    Keratosis, seborrheic    Lumbar spondylosis    w/impingement L3-S1   Past Surgical History:  Procedure Laterality Date   ABDOMINAL HYSTERECTOMY     BREAST EXCISIONAL BIOPSY Left    BREAST EXCISIONAL BIOPSY Left    BREAST EXCISIONAL BIOPSY Right    BREAST SURGERY     cyst removal   CAROTID ENDARTERECTOMY Left 2010   CHOLECYSTECTOMY     EYE SURGERY Bilateral 06/2014   cataracts   JOINT REPLACEMENT     RIGHT KNEE   REPLACEMENT TOTAL KNEE Right    Patient Active Problem List   Diagnosis Date Noted   Cerebellar stroke (HCC) 12/20/2021   Vertigo 12/14/2021   CVA (cerebral vascular accident) (HCC) 12/14/2021   Hypokalemia 12/14/2021   Leukocytosis 12/14/2021   Essential hypertension 12/14/2021   Anxiety 12/14/2021   Hyperlipidemia 12/14/2021   Carotid artery disease (HCC) 12/14/2021   Osteoarthritis cervical spine 09/21/2020   Compression fracture of T12 vertebra (HCC) 04/16/2018   Paroxysmal atrial fibrillation (HCC) 03/03/2018   Occlusion and stenosis of carotid artery without mention of cerebral infarction 02/05/2012     ONSET DATE: 08/13/2023 (referral)  REFERRING DIAG: R26.89 (ICD-10-CM) - Other abnormalities of gait and mobility  THERAPY DIAG:  Unsteadiness on feet  Muscle weakness (generalized)  Other lack of coordination  History of falling  Abnormal posture  Abnormality of gait and mobility  Rationale for Evaluation and Treatment: Rehabilitation  SUBJECTIVE:  SUBJECTIVE STATEMENT: Pt presents w/RW, she denies falls or pain today.   Pt accompanied by: Daughter in Social worker, Melinda   PERTINENT HISTORY: A-fib, COPD, hypertension, stroke  PAIN:  Are you having pain? No Pt reports frequent neck pain 2/2 arthritis   PRECAUTIONS: Fall; pt needs to visualize mouth to fully comprehend what she is hearing - Severe hearing loss w/ bilateral hearing aides  RED FLAGS: None   WEIGHT BEARING RESTRICTIONS: No  FALLS: Has patient fallen in last 6 months? Yes. Number of falls 1  LIVING ENVIRONMENT: Lives with: lives alone Lives in: House/apartment Stairs: Yes: External: 1 in back and 3 in front steps; on right going up, on left going up, and can reach both Has following equipment at home: Single point cane, Walker - 2 wheeled, shower chair, and Grab bars  PLOF: Requires assistive device for independence, Leisure: reading, playing games on the computer, and is driving  PATIENT GOALS: my balance   OBJECTIVE:  Note: Objective measures were completed at Evaluation unless otherwise noted.  DIAGNOSTIC FINDINGS: CTA of head/neck from 11/2021  IMPRESSION: 1. Negative for large vessel occlusion, but Positive for complex plaque at the Left ICA origin where it's difficult to exclude ADHERENT THROMBUS within the vessel lumen (series 12, image 87, but alternatively this appearance might be ulcerated plaque  and/or an unusual carotid web. Subsequent stenosis numerically estimated at 60%.   2. Positive also for evidence of cervical Right ICA Fibromuscular Dysplasia (FMD).   3. But otherwise mild for age atherosclerosis, and essentially negative intracranial anterior circulation with no other arterial stenosis identified.   4. Stable CT appearance of the brain, No acute intracranial abnormality.   5. Aortic Atherosclerosis (ICD10-I70.0) and Emphysema (ICD10-J43.9). Tortuous aortic arch.   6. 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  COGNITION: Overall cognitive status: Within functional limits for tasks assessed   SENSATION: Pt reports bilateral peripheral neuropathy    EDEMA: Pt reports frequent swelling     POSTURE: rounded shoulders, forward head, and increased thoracic kyphosis  LOWER EXTREMITY ROM:     Active  Right Eval Left Eval  Hip flexion    Hip extension    Hip abduction    Hip adduction    Hip internal rotation    Hip external rotation    Knee flexion    Knee extension    Ankle dorsiflexion    Ankle plantarflexion    Ankle inversion    Ankle eversion     (Blank rows = not tested)  LOWER EXTREMITY MMT:  Tested in seated position   MMT Right Eval Left Eval  Hip flexion 4- 4  Hip extension    Hip abduction 4+ 4  Hip adduction 4 4-  Hip internal rotation    Hip external rotation    Knee flexion 5 4+  Knee extension 5 4+  Ankle dorsiflexion 5 5  Ankle plantarflexion    Ankle inversion    Ankle eversion    (Blank rows = not tested)  BED MOBILITY:  Not tested Pt reports difficulty due to high height of bed. Wants to get rid of her headboard but is unable to do this on her own   TRANSFERS: Sit to stand: SBA  Assistive device utilized: Environmental consultant - 2 wheeled     Stand to sit: SBA  Assistive device utilized: Environmental consultant - 2 wheeled     Bilateral genu valgus, weight shift to R side, heavy BUE reliance   RAMP:  Not  tested  CURB:  Not tested  STAIRS: Not tested GAIT: Gait pattern: step through pattern, decreased step length- Right, decreased stride length, decreased hip/knee flexion- Right, decreased ankle dorsiflexion- Right, lateral hip instability, trunk flexed, and poor foot clearance- Right Distance walked: Various clinic distances  Assistive device utilized: Environmental consultant - 2 wheeled Level of assistance: SBA Comments: Min cues to stay close to RW to facilitate upright posture and to stay inside RW when turning    FUNCTIONAL TESTS:                                                                                                                                 TREATMENT:   Reviewed HEP: - Corner Balance Feet Together With Eyes Open 3x30 seconds - Corner Balance Feet Together With Eyes Closed  3x30 sec, SBA-CGA moderate sway - better w/ single fingertip support - Standing March with Counter Support  2x20 - Sit to Stand with Armchair  x10 - Standing Tandem Balance with Counter Support  3x30 seconds each side - Supine Active Straight Leg Raise  x10 each LE  -Bridges 2x10, pt maintains reduced ROM -Standing hip abduction and extension x5 each LE, pt requires seated recovery due to fatigue and DOE  PATIENT EDUCATION: Education details:  Safety w/ 2WW for standing and approaching to sit. Discussed hearing changes and continued follow-up with audiologist for adjustments to hearing aides as needed.  Continue HEP as reviewed today w/ discussions of progressing slowly to lowest UE support safely able.  OT referral - complaints of handwriting changes and tremors since CVA. Person educated: Patient and DIL Education method: Explanation Education comprehension: verbalized understanding and needs further education  HOME EXERCISE PROGRAM: To be reviewed from previous POC: [id number]   GOALS: Goals reviewed with patient? Yes  SHORT TERM GOALS: Target date: 10/18/2023   Pt will be independent with initial  HEP for improved strength, balance, transfers and gait.  Baseline: to be reviewed/updated from previous POC  Goal status: INITIAL  2.  Pt will improve 5 x STS to less than or equal to 16 seconds w/improved anterior weight shift to demonstrate improved functional strength and transfer efficiency.   Baseline: 17.6s w/BUE support and poor body mechanics  Goal status: INITIAL  3.  Pt will improve gait velocity to at least 0.72 m/s w/LRAD for improved gait efficiency and independence   Baseline: 0.65 m/s w/RW  Goal status: INITIAL  4.  Pt will improve normal TUG to less than or equal to 23 seconds w/LRAD for improved functional mobility and decreased fall risk.  Baseline: 26.59s w/RW Goal status: INITIAL  LONG TERM GOALS: Target date: 11/01/2023   Pt will be independent with final HEP for improved strength, balance, transfers and gait.  Baseline:  Goal status: INITIAL  2.  Pt will improve 5 x STS to less than or equal to 15 seconds w/proper body mechanics to demonstrate improved functional strength and transfer efficiency.   Baseline:  17.6s w/improper body mechanics  Goal status: INITIAL  3.  Pt will improve gait velocity to at least 0.27m/s w/LRAD for improved gait efficiency and reduced fall risk   Baseline: 0.65 m/s wRW Goal status: INITIAL  4.  Pt will improve normal TUG to less than or equal to 20 seconds w/LRAD for improved functional mobility and decreased fall risk.  Baseline: 26.59s w/RW  Goal status: INITIAL  5.  Pt will perform floor transfer w/min A for improved functional strength and fall recovery at home  Baseline:  Goal status: INITIAL   ASSESSMENT:  CLINICAL IMPRESSION: Patient presents for initial follow-up treatment with emphasis of skilled PT session on reviewing prior HEP.  She remains primarily inactive at home, but was encouraged to restart HEP today as these exercises remain functionally appropriate.  Her endurance limited progression far beyond  these exercises today so PT to further address this with chair yoga, seated aerobics and other programs that she can carryover to home.  Continue per POC.   OBJECTIVE IMPAIRMENTS: Abnormal gait, cardiopulmonary status limiting activity, decreased activity tolerance, decreased balance, decreased cognition, decreased endurance, decreased knowledge of condition, decreased knowledge of use of DME, decreased mobility, difficulty walking, decreased strength, decreased safety awareness, impaired perceived functional ability, impaired sensation, improper body mechanics, and pain  ACTIVITY LIMITATIONS: carrying, lifting, bending, standing, squatting, stairs, transfers, bed mobility, locomotion level, and caring for others  PARTICIPATION LIMITATIONS: meal prep, cleaning, laundry, driving, shopping, community activity, and yard work  PERSONAL FACTORS: Fitness, Transportation, and 1-2 comorbidities: Hx of CVA are also affecting patient's functional outcome.   REHAB POTENTIAL: Good  CLINICAL DECISION MAKING: Evolving/moderate complexity  EVALUATION COMPLEXITY: Moderate  PLAN:  PT FREQUENCY: 2x/week  PT DURATION: 6 weeks  PLANNED INTERVENTIONS: 02835- PT Re-evaluation, 97750- Physical Performance Testing, 97110-Therapeutic exercises, 97530- Therapeutic activity, 97112- Neuromuscular re-education, 97535- Self Care, 02859- Manual therapy, [phone]- Gait training, [phone]- Aquatic Therapy, [phone]- Electrical stimulation (manual), [phone] (1-2 muscles), 20561 (3+ muscles)- Dry Needling, Patient/Family education, Balance training, Stair training, Joint mobilization, Spinal mobilization, Vestibular training, and DME instructions  PLAN FOR NEXT SESSION: Review HEP from previous POC and revise as needed (pt will need to work on more functional strength tasks- sit to stands, standing marches, side stepping, etc.). Work on turns!! Upright posture, endurance - seated aerobics, did she bring her chair yoga?-compare to our  handout!  Did we get OT referral?   Daved KATHEE Bull, PT, DPT 10/02/2023, 4:50 PM

## 2023-10-04 ENCOUNTER — Ambulatory Visit: Payer: Medicare (Managed Care) | Admitting: Physical Therapy

## 2023-10-04 ENCOUNTER — Encounter: Payer: Self-pay | Admitting: Physical Therapy

## 2023-10-04 VITALS — BP 163/77 | HR 60

## 2023-10-04 DIAGNOSIS — R293 Abnormal posture: Secondary | ICD-10-CM

## 2023-10-04 DIAGNOSIS — R269 Unspecified abnormalities of gait and mobility: Secondary | ICD-10-CM

## 2023-10-04 DIAGNOSIS — R278 Other lack of coordination: Secondary | ICD-10-CM

## 2023-10-04 DIAGNOSIS — M6281 Muscle weakness (generalized): Secondary | ICD-10-CM

## 2023-10-04 DIAGNOSIS — R2681 Unsteadiness on feet: Secondary | ICD-10-CM

## 2023-10-04 DIAGNOSIS — Z9181 History of falling: Secondary | ICD-10-CM

## 2023-10-04 NOTE — Therapy (Signed)
 OUTPATIENT PHYSICAL THERAPY NEURO TREATMENT   Patient Name: [name] MRN: [id number] DOB:03/14/1934, 89 y.o., female Today's Date: 10/04/2023   PCP: Auston Opal, DO REFERRING PROVIDER: Auston Opal, DO  END OF SESSION:  PT End of Session - 10/04/23 1454     Visit Number 3    Number of Visits 13    Date for PT Re-Evaluation 11/08/23    Authorization Type [employer]    PT Start Time 1447    PT Stop Time 1529    PT Time Calculation (min) 42 min    Equipment Utilized During Treatment Gait belt    Activity Tolerance Patient tolerated treatment well    Behavior During Therapy WFL for tasks assessed/performed          Past Medical History:  Diagnosis Date   Arthritis    Atrial fibrillation (HCC)    Carotid artery occlusion    Colitis    COPD (chronic obstructive pulmonary disease) (HCC)    Diverticulitis    Hypertension    Keratosis, seborrheic    Lumbar spondylosis    w/impingement L3-S1   Past Surgical History:  Procedure Laterality Date   ABDOMINAL HYSTERECTOMY     BREAST EXCISIONAL BIOPSY Left    BREAST EXCISIONAL BIOPSY Left    BREAST EXCISIONAL BIOPSY Right    BREAST SURGERY     cyst removal   CAROTID ENDARTERECTOMY Left 2010   CHOLECYSTECTOMY     EYE SURGERY Bilateral 06/2014   cataracts   JOINT REPLACEMENT     RIGHT KNEE   REPLACEMENT TOTAL KNEE Right    Patient Active Problem List   Diagnosis Date Noted   Cerebellar stroke (HCC) 12/20/2021   Vertigo 12/14/2021   CVA (cerebral vascular accident) (HCC) 12/14/2021   Hypokalemia 12/14/2021   Leukocytosis 12/14/2021   Essential hypertension 12/14/2021   Anxiety 12/14/2021   Hyperlipidemia 12/14/2021   Carotid artery disease (HCC) 12/14/2021   Osteoarthritis cervical spine 09/21/2020   Compression fracture of T12 vertebra (HCC) 04/16/2018   Paroxysmal atrial fibrillation (HCC) 03/03/2018   Occlusion and stenosis of carotid artery without mention of cerebral infarction 02/05/2012     ONSET DATE: 08/13/2023 (referral)  REFERRING DIAG: R26.89 (ICD-10-CM) - Other abnormalities of gait and mobility  THERAPY DIAG:  Unsteadiness on feet  Muscle weakness (generalized)  Other lack of coordination  History of falling  Abnormal posture  Abnormality of gait and mobility  Rationale for Evaluation and Treatment: Rehabilitation  SUBJECTIVE:  SUBJECTIVE STATEMENT: Pt presents w/RW, she denies falls or pain today.   Pt accompanied by: Daughter in Social worker, Melinda   PERTINENT HISTORY: A-fib, COPD, hypertension, stroke  PAIN:  Are you having pain? No Pt reports frequent neck pain 2/2 arthritis   PRECAUTIONS: Fall; pt needs to visualize mouth to fully comprehend what she is hearing - Severe hearing loss w/ bilateral hearing aides  RED FLAGS: None   WEIGHT BEARING RESTRICTIONS: No  FALLS: Has patient fallen in last 6 months? Yes. Number of falls 1  LIVING ENVIRONMENT: Lives with: lives alone Lives in: House/apartment Stairs: Yes: External: 1 in back and 3 in front steps; on right going up, on left going up, and can reach both Has following equipment at home: Single point cane, Walker - 2 wheeled, shower chair, and Grab bars  PLOF: Requires assistive device for independence, Leisure: reading, playing games on the computer, and is driving  PATIENT GOALS: my balance   OBJECTIVE:  Note: Objective measures were completed at Evaluation unless otherwise noted.  DIAGNOSTIC FINDINGS: CTA of head/neck from 11/2021  IMPRESSION: 1. Negative for large vessel occlusion, but Positive for complex plaque at the Left ICA origin where it's difficult to exclude ADHERENT THROMBUS within the vessel lumen (series 12, image 87, but alternatively this appearance might be ulcerated plaque  and/or an unusual carotid web. Subsequent stenosis numerically estimated at 60%.   2. Positive also for evidence of cervical Right ICA Fibromuscular Dysplasia (FMD).   3. But otherwise mild for age atherosclerosis, and essentially negative intracranial anterior circulation with no other arterial stenosis identified.   4. Stable CT appearance of the brain, No acute intracranial abnormality.   5. Aortic Atherosclerosis (ICD10-I70.0) and Emphysema (ICD10-J43.9). Tortuous aortic arch.   6. 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  COGNITION: Overall cognitive status: Within functional limits for tasks assessed   SENSATION: Pt reports bilateral peripheral neuropathy    EDEMA: Pt reports frequent swelling     POSTURE: rounded shoulders, forward head, and increased thoracic kyphosis  LOWER EXTREMITY ROM:     Active  Right Eval Left Eval  Hip flexion    Hip extension    Hip abduction    Hip adduction    Hip internal rotation    Hip external rotation    Knee flexion    Knee extension    Ankle dorsiflexion    Ankle plantarflexion    Ankle inversion    Ankle eversion     (Blank rows = not tested)  LOWER EXTREMITY MMT:  Tested in seated position   MMT Right Eval Left Eval  Hip flexion 4- 4  Hip extension    Hip abduction 4+ 4  Hip adduction 4 4-  Hip internal rotation    Hip external rotation    Knee flexion 5 4+  Knee extension 5 4+  Ankle dorsiflexion 5 5  Ankle plantarflexion    Ankle inversion    Ankle eversion    (Blank rows = not tested)  BED MOBILITY:  Not tested Pt reports difficulty due to high height of bed. Wants to get rid of her headboard but is unable to do this on her own   TRANSFERS: Sit to stand: SBA  Assistive device utilized: Environmental consultant - 2 wheeled     Stand to sit: SBA  Assistive device utilized: Environmental consultant - 2 wheeled     Bilateral genu valgus, weight shift to R side, heavy BUE reliance   RAMP:  Not  tested  CURB:  Not tested  STAIRS: Not tested GAIT: Gait pattern: step through pattern, decreased step length- Right, decreased stride length, decreased hip/knee flexion- Right, decreased ankle dorsiflexion- Right, lateral hip instability, trunk flexed, and poor foot clearance- Right Distance walked: Various clinic distances  Assistive device utilized: Walker - 2 wheeled Level of assistance: SBA Comments: Min cues to stay close to RW to facilitate upright posture and to stay inside RW when turning    FUNCTIONAL TESTS:                                                                                                                                 TREATMENT:   -SciFit progressive peaks mode up to level 5.0 for 8 minutes using BUE/BLE for endurance challenge and global strengthening.  RPE 6-7/10 following task.  Chair yoga (return demo): -Diaphragmatic breathing x10 -cat/cow x10 -trunk circles x10 CW > x10 CCW -Sun salutation x10 w/ paced breathing -Sun salutation w/ trunk rotation x10 each side -Assisted neck stretches 5x5 second hold each side -High altar side leans 5x3 seconds each side -Ankle to knee stretch no overpressure x45 seconds each side -Goddess w/ a twist x3 each side no hold -Forward fold x5 w/ paced breathing  PATIENT EDUCATION: Education details:  Continue HEP w/ progressions discussed prior.  Chair yoga.  Still awaiting OT referral - various goals/roles of OT.   Person educated: Patient and DIL Education method: Explanation Education comprehension: verbalized understanding and needs further education  HOME EXERCISE PROGRAM: To be reviewed from previous POC: [id number]   Chair yoga (replace one day of HEP w/ this): -Diaphragmatic breathing x10 -cat/cow x10 -trunk circles x10 CW > x10 CCW -Sun salutation x10 w/ paced breathing -Sun salutation w/ trunk rotation x10 each side -Assisted neck stretches 5x5 second hold each side -High altar side leans 5x3  seconds each side -Ankle to knee stretch no overpressure x45 seconds each side -Goddess w/ a twist x3 each side no hold -Forward fold x5 w/ paced breathing  GOALS: Goals reviewed with patient? Yes  SHORT TERM GOALS: Target date: 10/18/2023   Pt will be independent with initial HEP for improved strength, balance, transfers and gait.  Baseline: to be reviewed/updated from previous POC  Goal status: INITIAL  2.  Pt will improve 5 x STS to less than or equal to 16 seconds w/improved anterior weight shift to demonstrate improved functional strength and transfer efficiency.   Baseline: 17.6s w/BUE support and poor body mechanics  Goal status: INITIAL  3.  Pt will improve gait velocity to at least 0.72 m/s w/LRAD for improved gait efficiency and independence   Baseline: 0.65 m/s w/RW  Goal status: INITIAL  4.  Pt will improve normal TUG to less than or equal to 23 seconds w/LRAD for improved functional mobility and decreased fall risk.  Baseline: 26.59s w/RW Goal status: INITIAL  LONG TERM GOALS: Target date: 11/01/2023   Pt will be independent  with final HEP for improved strength, balance, transfers and gait.  Baseline:  Goal status: INITIAL  2.  Pt will improve 5 x STS to less than or equal to 15 seconds w/proper body mechanics to demonstrate improved functional strength and transfer efficiency.   Baseline: 17.6s w/improper body mechanics  Goal status: INITIAL  3.  Pt will improve gait velocity to at least 0.55m/s w/LRAD for improved gait efficiency and reduced fall risk   Baseline: 0.65 m/s wRW Goal status: INITIAL  4.  Pt will improve normal TUG to less than or equal to 20 seconds w/LRAD for improved functional mobility and decreased fall risk.  Baseline: 26.59s w/RW  Goal status: INITIAL  5.  Pt will perform floor transfer w/min A for improved functional strength and fall recovery at home  Baseline:  Goal status: INITIAL   ASSESSMENT:  CLINICAL  IMPRESSION: Reviewed chair yoga handout today to supplement HEP and improve flexibility and activity tolerance.  She remains challenged by poor endurance.  She would likely benefit from seated aerobics program to address this.  Patient remains highly motivated to participate.  PT will continue to address all mobility deficits as outlined in ongoing PT POC.   OBJECTIVE IMPAIRMENTS: Abnormal gait, cardiopulmonary status limiting activity, decreased activity tolerance, decreased balance, decreased cognition, decreased endurance, decreased knowledge of condition, decreased knowledge of use of DME, decreased mobility, difficulty walking, decreased strength, decreased safety awareness, impaired perceived functional ability, impaired sensation, improper body mechanics, and pain  ACTIVITY LIMITATIONS: carrying, lifting, bending, standing, squatting, stairs, transfers, bed mobility, locomotion level, and caring for others  PARTICIPATION LIMITATIONS: meal prep, cleaning, laundry, driving, shopping, community activity, and yard work  PERSONAL FACTORS: Fitness, Transportation, and 1-2 comorbidities: Hx of CVA are also affecting patient's functional outcome.   REHAB POTENTIAL: Good  CLINICAL DECISION MAKING: Evolving/moderate complexity  EVALUATION COMPLEXITY: Moderate  PLAN:  PT FREQUENCY: 2x/week  PT DURATION: 6 weeks  PLANNED INTERVENTIONS: 02835- PT Re-evaluation, 97750- Physical Performance Testing, 97110-Therapeutic exercises, 97530- Therapeutic activity, 97112- Neuromuscular re-education, 97535- Self Care, 02859- Manual therapy, [phone]- Gait training, [phone]- Aquatic Therapy, [phone]- Electrical stimulation (manual), [phone] (1-2 muscles), [phone] (3+ muscles)- Dry Needling, Patient/Family education, Balance training, Stair training, Joint mobilization, Spinal mobilization, Vestibular training, and DME instructions  PLAN FOR NEXT SESSION: Review HEP from previous POC and revise as needed (pt will need to  work on more functional strength tasks- sit to stands, standing marches, side stepping, etc.). Work on turns!! Upright posture, endurance - seated aerobics  Did we get OT referral?   Daved KATHEE Bull, PT, DPT 10/04/2023, 3:33 PM

## 2023-10-04 NOTE — Patient Instructions (Signed)
 Chair yoga (replace one day of HEP w/ this): -Diaphragmatic breathing x10 -cat/cow x10 -trunk circles x10 CW > x10 CCW -Sun salutation x10 w/ paced breathing -Sun salutation w/ trunk rotation x10 each side -Assisted neck stretches 5x5 second hold each side -High altar side leans 5x3 seconds each side -Ankle to knee stretch no overpressure x45 seconds each side -Goddess w/ a twist x3 each side no hold -Forward fold x5 w/ paced breathing

## 2023-10-08 ENCOUNTER — Ambulatory Visit: Payer: Medicare (Managed Care) | Admitting: Physical Therapy

## 2023-10-08 VITALS — BP 148/60 | HR 59

## 2023-10-08 DIAGNOSIS — M6281 Muscle weakness (generalized): Secondary | ICD-10-CM

## 2023-10-08 DIAGNOSIS — R2681 Unsteadiness on feet: Secondary | ICD-10-CM

## 2023-10-08 DIAGNOSIS — R293 Abnormal posture: Secondary | ICD-10-CM

## 2023-10-08 DIAGNOSIS — R278 Other lack of coordination: Secondary | ICD-10-CM

## 2023-10-08 NOTE — Therapy (Signed)
 OUTPATIENT PHYSICAL THERAPY NEURO TREATMENT   Patient Name: [name] MRN: [id number] DOB:1934/08/25, 89 y.o., female Today's Date: 10/08/2023   PCP: Auston Opal, DO REFERRING PROVIDER: Auston Opal, DO  END OF SESSION:  PT End of Session - 10/08/23 1450     Visit Number 4    Number of Visits 13    Date for PT Re-Evaluation 11/08/23    Authorization Type [employer]    PT Start Time 1447    PT Stop Time 1527    PT Time Calculation (min) 40 min    Equipment Utilized During Treatment Gait belt    Activity Tolerance Patient tolerated treatment well    Behavior During Therapy WFL for tasks assessed/performed           Past Medical History:  Diagnosis Date   Arthritis    Atrial fibrillation (HCC)    Carotid artery occlusion    Colitis    COPD (chronic obstructive pulmonary disease) (HCC)    Diverticulitis    Hypertension    Keratosis, seborrheic    Lumbar spondylosis    w/impingement L3-S1   Past Surgical History:  Procedure Laterality Date   ABDOMINAL HYSTERECTOMY     BREAST EXCISIONAL BIOPSY Left    BREAST EXCISIONAL BIOPSY Left    BREAST EXCISIONAL BIOPSY Right    BREAST SURGERY     cyst removal   CAROTID ENDARTERECTOMY Left 2010   CHOLECYSTECTOMY     EYE SURGERY Bilateral 06/2014   cataracts   JOINT REPLACEMENT     RIGHT KNEE   REPLACEMENT TOTAL KNEE Right    Patient Active Problem List   Diagnosis Date Noted   Cerebellar stroke (HCC) 12/20/2021   Vertigo 12/14/2021   CVA (cerebral vascular accident) (HCC) 12/14/2021   Hypokalemia 12/14/2021   Leukocytosis 12/14/2021   Essential hypertension 12/14/2021   Anxiety 12/14/2021   Hyperlipidemia 12/14/2021   Carotid artery disease (HCC) 12/14/2021   Osteoarthritis cervical spine 09/21/2020   Compression fracture of T12 vertebra (HCC) 04/16/2018   Paroxysmal atrial fibrillation (HCC) 03/03/2018   Occlusion and stenosis of carotid artery without mention of cerebral infarction  02/05/2012    ONSET DATE: 08/13/2023 (referral)  REFERRING DIAG: R26.89 (ICD-10-CM) - Other abnormalities of gait and mobility  THERAPY DIAG:  Unsteadiness on feet  Muscle weakness (generalized)  Other lack of coordination  Abnormal posture  Rationale for Evaluation and Treatment: Rehabilitation  SUBJECTIVE:  SUBJECTIVE STATEMENT: Pt presents w/RW, she denies falls or pain today. Has been doing her HEP some.   Pt accompanied by: Daughter in Social worker, Melinda   PERTINENT HISTORY: A-fib, COPD, hypertension, stroke  PAIN:  Are you having pain? Yes: NPRS scale: 3-4/10 Pain location: Neck Pain description: achy Pt reports frequent neck pain 2/2 arthritis   PRECAUTIONS: Fall; pt needs to visualize mouth to fully comprehend what she is hearing - Severe hearing loss w/ bilateral hearing aides  RED FLAGS: None   WEIGHT BEARING RESTRICTIONS: No  FALLS: Has patient fallen in last 6 months? Yes. Number of falls 1  LIVING ENVIRONMENT: Lives with: lives alone Lives in: House/apartment Stairs: Yes: External: 1 in back and 3 in front steps; on right going up, on left going up, and can reach both Has following equipment at home: Single point cane, Walker - 2 wheeled, shower chair, and Grab bars  PLOF: Requires assistive device for independence, Leisure: reading, playing games on the computer, and is driving  PATIENT GOALS: my balance   OBJECTIVE:  Note: Objective measures were completed at Evaluation unless otherwise noted.  DIAGNOSTIC FINDINGS: CTA of head/neck from 11/2021  IMPRESSION: 1. Negative for large vessel occlusion, but Positive for complex plaque at the Left ICA origin where it's difficult to exclude ADHERENT THROMBUS within the vessel lumen (series 12, image 87,  but alternatively this appearance might be ulcerated plaque and/or an unusual carotid web. Subsequent stenosis numerically estimated at 60%.   2. Positive also for evidence of cervical Right ICA Fibromuscular Dysplasia (FMD).   3. But otherwise mild for age atherosclerosis, and essentially negative intracranial anterior circulation with no other arterial stenosis identified.   4. Stable CT appearance of the brain, No acute intracranial abnormality.   5. Aortic Atherosclerosis (ICD10-I70.0) and Emphysema (ICD10-J43.9). Tortuous aortic arch.   6. 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  COGNITION: Overall cognitive status: Within functional limits for tasks assessed   SENSATION: Pt reports bilateral peripheral neuropathy    EDEMA: Pt reports frequent swelling     POSTURE: rounded shoulders, forward head, and increased thoracic kyphosis  LOWER EXTREMITY ROM:     Active  Right Eval Left Eval  Hip flexion    Hip extension    Hip abduction    Hip adduction    Hip internal rotation    Hip external rotation    Knee flexion    Knee extension    Ankle dorsiflexion    Ankle plantarflexion    Ankle inversion    Ankle eversion     (Blank rows = not tested)  LOWER EXTREMITY MMT:  Tested in seated position   MMT Right Eval Left Eval  Hip flexion 4- 4  Hip extension    Hip abduction 4+ 4  Hip adduction 4 4-  Hip internal rotation    Hip external rotation    Knee flexion 5 4+  Knee extension 5 4+  Ankle dorsiflexion 5 5  Ankle plantarflexion    Ankle inversion    Ankle eversion    (Blank rows = not tested)  BED MOBILITY:  Not tested Pt reports difficulty due to high height of bed. Wants to get rid of her headboard but is unable to do this on her own   TRANSFERS: Sit to stand: SBA  Assistive device utilized: Environmental consultant - 2 wheeled     Stand to sit: SBA  Assistive device utilized: Environmental consultant - 2 wheeled     Bilateral genu valgus,  weight shift to R side, heavy BUE reliance   RAMP:  Not tested  CURB:  Not tested  STAIRS: Not tested GAIT: Gait pattern: step through pattern, decreased step length- Right, decreased stride length, decreased hip/knee flexion- Right, decreased ankle dorsiflexion- Right, lateral hip instability, trunk flexed, and poor foot clearance- Right Distance walked: Various clinic distances  Assistive device utilized: Environmental consultant - 2 wheeled Level of assistance: SBA Comments: Min cues to stay close to RW to facilitate upright posture and to stay inside RW when turning    FUNCTIONAL TESTS:    VITALS  Vitals:   10/08/23 1452  BP: (!) 148/60  Pulse: (!) 59                                                                                                                                 TREATMENT:   Self-care/home management Assessed vitals (see above) and WNL for session  Ther Act  SciFit multi-peaks level 7.5 for 8 minutes using BUE/BLEs for neural priming for reciprocal movement, dynamic cardiovascular warmup and increased amplitude of stepping. RPE of 7/10 following activity  5 Blaze pods on random reach setting for improved postural control, visual scanning and standing tolerance.  Performed on 2 minute intervals with 4 minute seated rest periods.  Pt requires SBA guarding and single UE support on rail. Round 1:  pods placed on mirror from 6-3 o'clock superolaterally around pt.  54 hits. RPE of 7/10 Round 2:  same setup on airex.  52 hits. RPE of 8/10  Notable errors/deficits:  Increased difficulty reaching w/RUE, pt reporting significant levels of fatigue 1 minute into each round requiring min cues to finish round Sit to stands from 22.5 height x10 reps without UE support for improved functional BLE strength and endurance. Pt reported easiness w/activity, so progressed to 10 reps w/6kg ball throw. Pt initially throwing ball underhanded, so cued for chest press and pt able to perform w/CGA due  to difficulty stabilizing following ball throw.  Sit to stands from 18 mat x5 reps no UE support for continued BLE strengthening. Mod multimodal cues to facilitate proper anterior weight shift and reduce posterior bracing on mat w/BLEs. Pt required multiple attempts on reps 3 and 4, but finished strong. Encouraged pt to continue practicing this at home from a sturdy chair. RPE of 8/10 following session.     PATIENT EDUCATION: Education details: Continue HEP, work on sit to stands w/no UE support at home  Person educated: Patient and DIL Education method: Explanation Education comprehension: verbalized understanding and needs further education  HOME EXERCISE PROGRAM: To be reviewed from previous POC: [id number]   Chair yoga (replace one day of HEP w/ this): -Diaphragmatic breathing x10 -cat/cow x10 -trunk circles x10 CW > x10 CCW -Sun salutation x10 w/ paced breathing -Sun salutation w/ trunk rotation x10 each side -Assisted neck stretches 5x5 second hold each side -High altar side leans 5x3 seconds each side -Ankle  to knee stretch no overpressure x45 seconds each side -Goddess w/ a twist x3 each side no hold -Forward fold x5 w/ paced breathing  GOALS: Goals reviewed with patient? Yes  SHORT TERM GOALS: Target date: 10/18/2023   Pt will be independent with initial HEP for improved strength, balance, transfers and gait.  Baseline: to be reviewed/updated from previous POC  Goal status: INITIAL  2.  Pt will improve 5 x STS to less than or equal to 16 seconds w/improved anterior weight shift to demonstrate improved functional strength and transfer efficiency.   Baseline: 17.6s w/BUE support and poor body mechanics  Goal status: INITIAL  3.  Pt will improve gait velocity to at least 0.72 m/s w/LRAD for improved gait efficiency and independence   Baseline: 0.65 m/s w/RW  Goal status: INITIAL  4.  Pt will improve normal TUG to less than or equal to 23 seconds w/LRAD for improved  functional mobility and decreased fall risk.  Baseline: 26.59s w/RW Goal status: INITIAL  LONG TERM GOALS: Target date: 11/01/2023   Pt will be independent with final HEP for improved strength, balance, transfers and gait.  Baseline:  Goal status: INITIAL  2.  Pt will improve 5 x STS to less than or equal to 15 seconds w/proper body mechanics to demonstrate improved functional strength and transfer efficiency.   Baseline: 17.6s w/improper body mechanics  Goal status: INITIAL  3.  Pt will improve gait velocity to at least 0.24m/s w/LRAD for improved gait efficiency and reduced fall risk   Baseline: 0.65 m/s wRW Goal status: INITIAL  4.  Pt will improve normal TUG to less than or equal to 20 seconds w/LRAD for improved functional mobility and decreased fall risk.  Baseline: 26.59s w/RW  Goal status: INITIAL  5.  Pt will perform floor transfer w/min A for improved functional strength and fall recovery at home  Baseline:  Goal status: INITIAL   ASSESSMENT:  CLINICAL IMPRESSION: Emphasis of skilled PT session on functional endurance, postural control and BLE strength. Pt continues to be limited by poor cardiovascular endurance and global deconditioning, but was able to maintain higher intensity exercise throughout session w/seated rest breaks. Pt will continue to benefit from skilled PT to improve activity tolerance and independence for reduced fall risk. Continue POC.    OBJECTIVE IMPAIRMENTS: Abnormal gait, cardiopulmonary status limiting activity, decreased activity tolerance, decreased balance, decreased cognition, decreased endurance, decreased knowledge of condition, decreased knowledge of use of DME, decreased mobility, difficulty walking, decreased strength, decreased safety awareness, impaired perceived functional ability, impaired sensation, improper body mechanics, and pain  ACTIVITY LIMITATIONS: carrying, lifting, bending, standing, squatting, stairs, transfers, bed  mobility, locomotion level, and caring for others  PARTICIPATION LIMITATIONS: meal prep, cleaning, laundry, driving, shopping, community activity, and yard work  PERSONAL FACTORS: Fitness, Transportation, and 1-2 comorbidities: Hx of CVA are also affecting patient's functional outcome.   REHAB POTENTIAL: Good  CLINICAL DECISION MAKING: Evolving/moderate complexity  EVALUATION COMPLEXITY: Moderate  PLAN:  PT FREQUENCY: 2x/week  PT DURATION: 6 weeks  PLANNED INTERVENTIONS: 97164- PT Re-evaluation, 97750- Physical Performance Testing, 97110-Therapeutic exercises, 97530- Therapeutic activity, [id number]- Neuromuscular re-education, 97535- Self Care, [id number]- Manual therapy, [phone]- Gait training, [phone]- Aquatic Therapy, [phone]- Electrical stimulation (manual), [phone] (1-2 muscles), [id number] (3+ muscles)- Dry Needling, Patient/Family education, Balance training, Stair training, Joint mobilization, Spinal mobilization, Vestibular training, and DME instructions  PLAN FOR NEXT SESSION: Review HEP from previous POC and revise as needed (pt will need to work on more functional strength tasks- sit  to stands, standing marches, side stepping, etc.). Work on turns!! Upright posture, endurance - seated aerobics    Marlon BRAVO Reed Eifert, PT, DPT 10/08/2023, 3:28 PM

## 2023-10-10 ENCOUNTER — Ambulatory Visit: Payer: Medicare (Managed Care) | Admitting: Physical Therapy

## 2023-10-10 DIAGNOSIS — R2681 Unsteadiness on feet: Secondary | ICD-10-CM

## 2023-10-10 DIAGNOSIS — M6281 Muscle weakness (generalized): Secondary | ICD-10-CM

## 2023-10-10 DIAGNOSIS — R293 Abnormal posture: Secondary | ICD-10-CM

## 2023-10-10 NOTE — Therapy (Signed)
 OUTPATIENT PHYSICAL THERAPY NEURO TREATMENT   Patient Name: [name] MRN: [id number] DOB:1934/06/15, 89 y.o., female Today's Date: 10/10/2023   PCP: Auston Opal, DO REFERRING PROVIDER: Auston Opal, DO  END OF SESSION:  PT End of Session - 10/10/23 1540     Visit Number 5    Number of Visits 13    Date for Recertification  11/08/23    Authorization Type [employer]    PT Start Time 1533    PT Stop Time 1611    PT Time Calculation (min) 38 min    Equipment Utilized During Treatment Gait belt    Activity Tolerance Patient tolerated treatment well;Patient limited by fatigue    Behavior During Therapy WFL for tasks assessed/performed            Past Medical History:  Diagnosis Date   Arthritis    Atrial fibrillation (HCC)    Carotid artery occlusion    Colitis    COPD (chronic obstructive pulmonary disease) (HCC)    Diverticulitis    Hypertension    Keratosis, seborrheic    Lumbar spondylosis    w/impingement L3-S1   Past Surgical History:  Procedure Laterality Date   ABDOMINAL HYSTERECTOMY     BREAST EXCISIONAL BIOPSY Left    BREAST EXCISIONAL BIOPSY Left    BREAST EXCISIONAL BIOPSY Right    BREAST SURGERY     cyst removal   CAROTID ENDARTERECTOMY Left 2010   CHOLECYSTECTOMY     EYE SURGERY Bilateral 06/2014   cataracts   JOINT REPLACEMENT     RIGHT KNEE   REPLACEMENT TOTAL KNEE Right    Patient Active Problem List   Diagnosis Date Noted   Cerebellar stroke (HCC) 12/20/2021   Vertigo 12/14/2021   CVA (cerebral vascular accident) (HCC) 12/14/2021   Hypokalemia 12/14/2021   Leukocytosis 12/14/2021   Essential hypertension 12/14/2021   Anxiety 12/14/2021   Hyperlipidemia 12/14/2021   Carotid artery disease (HCC) 12/14/2021   Osteoarthritis cervical spine 09/21/2020   Compression fracture of T12 vertebra (HCC) 04/16/2018   Paroxysmal atrial fibrillation (HCC) 03/03/2018   Occlusion and stenosis of carotid artery without mention of  cerebral infarction 02/05/2012    ONSET DATE: 08/13/2023 (referral)  REFERRING DIAG: R26.89 (ICD-10-CM) - Other abnormalities of gait and mobility  THERAPY DIAG:  Unsteadiness on feet  Muscle weakness (generalized)  Abnormal posture  Rationale for Evaluation and Treatment: Rehabilitation  SUBJECTIVE:  SUBJECTIVE STATEMENT: Pt presents w/RW, she denies falls. States she did her HEP yesterday. I am gonna try to be real good over the weekend.   Pt accompanied by: Daughter in Social worker, Melinda   PERTINENT HISTORY: A-fib, COPD, hypertension, stroke  PAIN:  Are you having pain? Yes: NPRS scale: 2-3/10 Pain location: Neck Pain description: achy Pt reports frequent neck pain 2/2 arthritis   PRECAUTIONS: Fall; pt needs to visualize mouth to fully comprehend what she is hearing - Severe hearing loss w/ bilateral hearing aides  RED FLAGS: None   WEIGHT BEARING RESTRICTIONS: No  FALLS: Has patient fallen in last 6 months? Yes. Number of falls 1  LIVING ENVIRONMENT: Lives with: lives alone Lives in: House/apartment Stairs: Yes: External: 1 in back and 3 in front steps; on right going up, on left going up, and can reach both Has following equipment at home: Single point cane, Walker - 2 wheeled, shower chair, and Grab bars  PLOF: Requires assistive device for independence, Leisure: reading, playing games on the computer, and is driving  PATIENT GOALS: my balance   OBJECTIVE:  Note: Objective measures were completed at Evaluation unless otherwise noted.  DIAGNOSTIC FINDINGS: CTA of head/neck from 11/2021  IMPRESSION: 1. Negative for large vessel occlusion, but Positive for complex plaque at the Left ICA origin where it's difficult to exclude ADHERENT THROMBUS within the vessel lumen (series  12, image 87, but alternatively this appearance might be ulcerated plaque and/or an unusual carotid web. Subsequent stenosis numerically estimated at 60%.   2. Positive also for evidence of cervical Right ICA Fibromuscular Dysplasia (FMD).   3. But otherwise mild for age atherosclerosis, and essentially negative intracranial anterior circulation with no other arterial stenosis identified.   4. Stable CT appearance of the brain, No acute intracranial abnormality.   5. Aortic Atherosclerosis (ICD10-I70.0) and Emphysema (ICD10-J43.9). Tortuous aortic arch.   6. 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  COGNITION: Overall cognitive status: Within functional limits for tasks assessed   SENSATION: Pt reports bilateral peripheral neuropathy    EDEMA: Pt reports frequent swelling     POSTURE: rounded shoulders, forward head, and increased thoracic kyphosis  LOWER EXTREMITY ROM:     Active  Right Eval Left Eval  Hip flexion    Hip extension    Hip abduction    Hip adduction    Hip internal rotation    Hip external rotation    Knee flexion    Knee extension    Ankle dorsiflexion    Ankle plantarflexion    Ankle inversion    Ankle eversion     (Blank rows = not tested)  LOWER EXTREMITY MMT:  Tested in seated position   MMT Right Eval Left Eval  Hip flexion 4- 4  Hip extension    Hip abduction 4+ 4  Hip adduction 4 4-  Hip internal rotation    Hip external rotation    Knee flexion 5 4+  Knee extension 5 4+  Ankle dorsiflexion 5 5  Ankle plantarflexion    Ankle inversion    Ankle eversion    (Blank rows = not tested)  BED MOBILITY:  Not tested Pt reports difficulty due to high height of bed. Wants to get rid of her headboard but is unable to do this on her own   TRANSFERS: Sit to stand: SBA  Assistive device utilized: Environmental consultant - 2 wheeled     Stand to sit: SBA  Assistive device utilized: Environmental consultant - 2  wheeled      Bilateral genu valgus, weight shift to R side, heavy BUE reliance   RAMP:  Not tested  CURB:  Not tested  STAIRS: Not tested GAIT: Gait pattern: step through pattern, decreased step length- Right, decreased stride length, decreased hip/knee flexion- Right, decreased ankle dorsiflexion- Right, lateral hip instability, trunk flexed, and poor foot clearance- Right Distance walked: Various clinic distances  Assistive device utilized: Walker - 2 wheeled Level of assistance: SBA Comments: Min cues to stay close to RW to facilitate upright posture and to stay inside RW when turning. Min cues to reduce scuffing of R foot   FUNCTIONAL TESTS:    VITALS  There were no vitals filed for this visit.                                                                                                                                TREATMENT:   Ther Act/NMR  SciFit multi-peaks level 8.5 for 8 minutes using BUE/BLEs for neural priming for reciprocal movement, dynamic cardiovascular warmup and increased amplitude of stepping. RPE of 4-5/10 following activity.  While standing on airex, placed Squigz on mirror to pt's R side and had pt stand parallel to mirror and reach cross-body w/LUE to grab squigz from mirror and place on desk table on L side. Pt required CGA-min A for stability. Pt frequently losing balance anteriorly and required mod cues to perform task w/o UE support on rail for added challenge. Then had pt perform cross-body reach to desk table w/RUE and place Squigz on mirror on R side. Pt w/improved stability reaching to R, CGA throughout.  At countertop while standing on balance beam, Blaze pods on random reach setting for improved reacting balance, postural control and stability reaching out of BOS.  Performed on 2 minute intervals with 30s rest periods.  Pt requires CGA guarding. Round 1:  placed pods in horizontal line on counter.  37 hits. Round 2:  same setup.  51 hits. Notable  errors/deficits:  Min cues to pick up foot to step to R side, single anterior LOB which pt able to self-correct. RPE of 7/10 following activity    PATIENT EDUCATION: Education details: Continue HEP Person educated: Patient and DIL Education method: Explanation Education comprehension: verbalized understanding and needs further education  HOME EXERCISE PROGRAM: To be reviewed from previous POC: [id number]   Chair yoga (replace one day of HEP w/ this): -Diaphragmatic breathing x10 -cat/cow x10 -trunk circles x10 CW > x10 CCW -Sun salutation x10 w/ paced breathing -Sun salutation w/ trunk rotation x10 each side -Assisted neck stretches 5x5 second hold each side -High altar side leans 5x3 seconds each side -Ankle to knee stretch no overpressure x45 seconds each side -Goddess w/ a twist x3 each side no hold -Forward fold x5 w/ paced breathing  GOALS: Goals reviewed with patient? Yes  SHORT TERM GOALS: Target date: 10/18/2023   Pt will be independent with initial  HEP for improved strength, balance, transfers and gait.  Baseline: to be reviewed/updated from previous POC  Goal status: INITIAL  2.  Pt will improve 5 x STS to less than or equal to 16 seconds w/improved anterior weight shift to demonstrate improved functional strength and transfer efficiency.   Baseline: 17.6s w/BUE support and poor body mechanics  Goal status: INITIAL  3.  Pt will improve gait velocity to at least 0.72 m/s w/LRAD for improved gait efficiency and independence   Baseline: 0.65 m/s w/RW  Goal status: INITIAL  4.  Pt will improve normal TUG to less than or equal to 23 seconds w/LRAD for improved functional mobility and decreased fall risk.  Baseline: 26.59s w/RW Goal status: INITIAL  LONG TERM GOALS: Target date: 11/01/2023   Pt will be independent with final HEP for improved strength, balance, transfers and gait.  Baseline:  Goal status: INITIAL  2.  Pt will improve 5 x STS to less than or  equal to 15 seconds w/proper body mechanics to demonstrate improved functional strength and transfer efficiency.   Baseline: 17.6s w/improper body mechanics  Goal status: INITIAL  3.  Pt will improve gait velocity to at least 0.62m/s w/LRAD for improved gait efficiency and reduced fall risk   Baseline: 0.65 m/s wRW Goal status: INITIAL  4.  Pt will improve normal TUG to less than or equal to 20 seconds w/LRAD for improved functional mobility and decreased fall risk.  Baseline: 26.59s w/RW  Goal status: INITIAL  5.  Pt will perform floor transfer w/min A for improved functional strength and fall recovery at home  Baseline:  Goal status: INITIAL   ASSESSMENT:  CLINICAL IMPRESSION: Emphasis of skilled PT session on functional endurance, reacting balance and reaching out of BOS. Pt less fatigued today but required min cues to avoid dragging R foot, as pt forgets to pick it up. Pt w/decreased stability when reaching and turning to L side, so will continue to practice in PT for improved safety at home. Continue POC.    OBJECTIVE IMPAIRMENTS: Abnormal gait, cardiopulmonary status limiting activity, decreased activity tolerance, decreased balance, decreased cognition, decreased endurance, decreased knowledge of condition, decreased knowledge of use of DME, decreased mobility, difficulty walking, decreased strength, decreased safety awareness, impaired perceived functional ability, impaired sensation, improper body mechanics, and pain  ACTIVITY LIMITATIONS: carrying, lifting, bending, standing, squatting, stairs, transfers, bed mobility, locomotion level, and caring for others  PARTICIPATION LIMITATIONS: meal prep, cleaning, laundry, driving, shopping, community activity, and yard work  PERSONAL FACTORS: Fitness, Transportation, and 1-2 comorbidities: Hx of CVA are also affecting patient's functional outcome.   REHAB POTENTIAL: Good  CLINICAL DECISION MAKING: Evolving/moderate  complexity  EVALUATION COMPLEXITY: Moderate  PLAN:  PT FREQUENCY: 2x/week  PT DURATION: 6 weeks  PLANNED INTERVENTIONS: 02835- PT Re-evaluation, 97750- Physical Performance Testing, 97110-Therapeutic exercises, 97530- Therapeutic activity, 97112- Neuromuscular re-education, 97535- Self Care, 02859- Manual therapy, [phone]- Gait training, [phone]- Aquatic Therapy, [phone]- Electrical stimulation (manual), [phone] (1-2 muscles), [phone] (3+ muscles)- Dry Needling, Patient/Family education, Balance training, Stair training, Joint mobilization, Spinal mobilization, Vestibular training, and DME instructions  PLAN FOR NEXT SESSION: Review HEP from previous POC and revise as needed (pt will need to work on more functional strength tasks- sit to stands, standing marches, side stepping, etc.). Work on turns!! Upright posture, endurance - seated aerobics, reaching to L side, high amplitude movements     Teffany Blaszczyk E Alvaretta Eisenberger, PT, DPT 10/10/2023, 4:12 PM

## 2023-10-15 ENCOUNTER — Ambulatory Visit: Payer: Medicare (Managed Care)

## 2023-10-15 ENCOUNTER — Ambulatory Visit: Payer: Medicare (Managed Care) | Admitting: Physical Therapy

## 2023-10-15 VITALS — BP 158/62 | HR 56

## 2023-10-15 DIAGNOSIS — Z9181 History of falling: Secondary | ICD-10-CM

## 2023-10-15 DIAGNOSIS — R278 Other lack of coordination: Secondary | ICD-10-CM

## 2023-10-15 DIAGNOSIS — R2681 Unsteadiness on feet: Secondary | ICD-10-CM

## 2023-10-15 DIAGNOSIS — M6281 Muscle weakness (generalized): Secondary | ICD-10-CM

## 2023-10-15 DIAGNOSIS — R29898 Other symptoms and signs involving the musculoskeletal system: Secondary | ICD-10-CM

## 2023-10-15 DIAGNOSIS — R293 Abnormal posture: Secondary | ICD-10-CM

## 2023-10-15 NOTE — Addendum Note (Signed)
 Addended by: Shikira Folino on: 10/15/2023 04:06 PM   Modules accepted: Orders

## 2023-10-15 NOTE — Therapy (Signed)
 OUTPATIENT PHYSICAL THERAPY NEURO TREATMENT   Patient Name: [name] MRN: [id number] DOB:07-10-1934, 89 y.o., female Today's Date: 10/15/2023   PCP: Auston Opal, DO REFERRING PROVIDER: Auston Opal, DO  END OF SESSION:  PT End of Session - 10/15/23 1533     Visit Number 6    Number of Visits 13    Date for Recertification  11/08/23    Authorization Type [employer]    PT Start Time 1531    PT Stop Time 1613    PT Time Calculation (min) 42 min    Equipment Utilized During Treatment Gait belt    Activity Tolerance Patient tolerated treatment well    Behavior During Therapy WFL for tasks assessed/performed             Past Medical History:  Diagnosis Date   Arthritis    Atrial fibrillation (HCC)    Carotid artery occlusion    Colitis    COPD (chronic obstructive pulmonary disease) (HCC)    Diverticulitis    Hypertension    Keratosis, seborrheic    Lumbar spondylosis    w/impingement L3-S1   Past Surgical History:  Procedure Laterality Date   ABDOMINAL HYSTERECTOMY     BREAST EXCISIONAL BIOPSY Left    BREAST EXCISIONAL BIOPSY Left    BREAST EXCISIONAL BIOPSY Right    BREAST SURGERY     cyst removal   CAROTID ENDARTERECTOMY Left 2010   CHOLECYSTECTOMY     EYE SURGERY Bilateral 06/2014   cataracts   JOINT REPLACEMENT     RIGHT KNEE   REPLACEMENT TOTAL KNEE Right    Patient Active Problem List   Diagnosis Date Noted   Cerebellar stroke (HCC) 12/20/2021   Vertigo 12/14/2021   CVA (cerebral vascular accident) (HCC) 12/14/2021   Hypokalemia 12/14/2021   Leukocytosis 12/14/2021   Essential hypertension 12/14/2021   Anxiety 12/14/2021   Hyperlipidemia 12/14/2021   Carotid artery disease 12/14/2021   Osteoarthritis cervical spine 09/21/2020   Compression fracture of T12 vertebra (HCC) 04/16/2018   Paroxysmal atrial fibrillation (HCC) 03/03/2018   Occlusion and stenosis of carotid artery without mention of cerebral infarction 02/05/2012     ONSET DATE: 08/13/2023 (referral)  REFERRING DIAG: R26.89 (ICD-10-CM) - Other abnormalities of gait and mobility  THERAPY DIAG:  Unsteadiness on feet  Muscle weakness (generalized)  Abnormal posture  Other lack of coordination  Rationale for Evaluation and Treatment: Rehabilitation  SUBJECTIVE:  SUBJECTIVE STATEMENT: Pt presents w/RW, she denies falls. States she did not feel well over the weekend, is feeling some better today. No falls   Pt accompanied by: Self   PERTINENT HISTORY: A-fib, COPD, hypertension, stroke  PAIN:  Are you having pain? Yes: NPRS scale: 1/10 Pain location: Neck Pain description: achy Pt reports frequent neck pain 2/2 arthritis   PRECAUTIONS: Fall; pt needs to visualize mouth to fully comprehend what she is hearing - Severe hearing loss w/ bilateral hearing aides  RED FLAGS: None   WEIGHT BEARING RESTRICTIONS: No  FALLS: Has patient fallen in last 6 months? Yes. Number of falls 1  LIVING ENVIRONMENT: Lives with: lives alone Lives in: House/apartment Stairs: Yes: External: 1 in back and 3 in front steps; on right going up, on left going up, and can reach both Has following equipment at home: Single point cane, Walker - 2 wheeled, shower chair, and Grab bars  PLOF: Requires assistive device for independence, Leisure: reading, playing games on the computer, and is driving  PATIENT GOALS: my balance   OBJECTIVE:  Note: Objective measures were completed at Evaluation unless otherwise noted.  DIAGNOSTIC FINDINGS: CTA of head/neck from 11/2021  IMPRESSION: 1. Negative for large vessel occlusion, but Positive for complex plaque at the Left ICA origin where it's difficult to exclude ADHERENT THROMBUS within the vessel lumen (series 12, image 87,  but alternatively this appearance might be ulcerated plaque and/or an unusual carotid web. Subsequent stenosis numerically estimated at 60%.   2. Positive also for evidence of cervical Right ICA Fibromuscular Dysplasia (FMD).   3. But otherwise mild for age atherosclerosis, and essentially negative intracranial anterior circulation with no other arterial stenosis identified.   4. Stable CT appearance of the brain, No acute intracranial abnormality.   5. Aortic Atherosclerosis (ICD10-I70.0) and Emphysema (ICD10-J43.9). Tortuous aortic arch.   6. 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  COGNITION: Overall cognitive status: Within functional limits for tasks assessed   SENSATION: Pt reports bilateral peripheral neuropathy    EDEMA: Pt reports frequent swelling     POSTURE: rounded shoulders, forward head, and increased thoracic kyphosis  LOWER EXTREMITY ROM:     Active  Right Eval Left Eval  Hip flexion    Hip extension    Hip abduction    Hip adduction    Hip internal rotation    Hip external rotation    Knee flexion    Knee extension    Ankle dorsiflexion    Ankle plantarflexion    Ankle inversion    Ankle eversion     (Blank rows = not tested)  LOWER EXTREMITY MMT:  Tested in seated position   MMT Right Eval Left Eval  Hip flexion 4- 4  Hip extension    Hip abduction 4+ 4  Hip adduction 4 4-  Hip internal rotation    Hip external rotation    Knee flexion 5 4+  Knee extension 5 4+  Ankle dorsiflexion 5 5  Ankle plantarflexion    Ankle inversion    Ankle eversion    (Blank rows = not tested)  BED MOBILITY:  Not tested Pt reports difficulty due to high height of bed. Wants to get rid of her headboard but is unable to do this on her own   TRANSFERS: Sit to stand: SBA  Assistive device utilized: Environmental consultant - 2 wheeled     Stand to sit: SBA  Assistive device utilized: Environmental consultant - 2 wheeled  Bilateral genu valgus,  weight shift to R side, heavy BUE reliance   RAMP:  Not tested  CURB:  Not tested  STAIRS: Not tested GAIT: Gait pattern: step through pattern, decreased step length- Right, decreased stride length, decreased hip/knee flexion- Right, decreased ankle dorsiflexion- Right, lateral hip instability, trunk flexed, and poor foot clearance- Right Distance walked: Various clinic distances  Assistive device utilized: Walker - 2 wheeled Level of assistance: SBA Comments: Min cues to stay close to RW to facilitate upright posture and to stay inside RW when turning. Min cues to reduce scuffing of R foot   FUNCTIONAL TESTS:    VITALS  Vitals:   10/15/23 1534 10/15/23 1536  BP: (!) 176/73 (!) 158/62  Pulse: 63 (!) 56                                                                                                                                  TREATMENT:   Self-care/home management Assessed vitals in LUE at rest (see above) and systolic BP initially very elevated at rest. Reassessed BP after several minutes and BP did reduce within limits for session.   NMR  At rebounder, placed 4 L and 4 R colored feet in front of rebounder and had pt step to various colored feet and throw/catch 1 kg ball x12 reps, for improved reactive stepping, reactive balance strategies and LE coordination. Pt required CGA-min A due to anterior instability, but progressed to CGA only w/practice. Increased difficulty stepping to R side  Resisted step ups to 6 box, x10 reps per side, w/red resistance band around pt's pelvis pulling pt posteriorly held by second therapist for improved anterior weight shifting, BLE strength and standing balance. Pt required CGA-min A guarding and progressed from BUE support to single UE support. Pt frequently tripping on LLE due to decreased step clearance/length.  Modified deadlifts to 12 box using 15# KB, x10 reps, for improved posterior chain strength, facilitation of hip hinge and  postural control. CGA throughout for safety. Pt reported difficulty w/movement but no LOB noted.    PATIENT EDUCATION: Education details: Continue HEP, strong encouragement to work on upright posture and walking program at home  Person educated: Patient Education method: Programmer, multimedia, Facilities manager, and Verbal cues Education comprehension: verbalized understanding, returned demonstration, verbal cues required, and needs further education  HOME EXERCISE PROGRAM: To be reviewed from previous POC: [id number]   Chair yoga (replace one day of HEP w/ this): -Diaphragmatic breathing x10 -cat/cow x10 -trunk circles x10 CW > x10 CCW -Sun salutation x10 w/ paced breathing -Sun salutation w/ trunk rotation x10 each side -Assisted neck stretches 5x5 second hold each side -High altar side leans 5x3 seconds each side -Ankle to knee stretch no overpressure x45 seconds each side -Goddess w/ a twist x3 each side no hold -Forward fold x5 w/ paced breathing  GOALS: Goals reviewed with patient? Yes  SHORT TERM GOALS: Target date: 10/18/2023   Pt  will be independent with initial HEP for improved strength, balance, transfers and gait.  Baseline: to be reviewed/updated from previous POC  Goal status: INITIAL  2.  Pt will improve 5 x STS to less than or equal to 16 seconds w/improved anterior weight shift to demonstrate improved functional strength and transfer efficiency.   Baseline: 17.6s w/BUE support and poor body mechanics  Goal status: INITIAL  3.  Pt will improve gait velocity to at least 0.72 m/s w/LRAD for improved gait efficiency and independence   Baseline: 0.65 m/s w/RW  Goal status: INITIAL  4.  Pt will improve normal TUG to less than or equal to 23 seconds w/LRAD for improved functional mobility and decreased fall risk.  Baseline: 26.59s w/RW Goal status: INITIAL  LONG TERM GOALS: Target date: 11/01/2023   Pt will be independent with final HEP for improved strength, balance,  transfers and gait.  Baseline:  Goal status: INITIAL  2.  Pt will improve 5 x STS to less than or equal to 15 seconds w/proper body mechanics to demonstrate improved functional strength and transfer efficiency.   Baseline: 17.6s w/improper body mechanics  Goal status: INITIAL  3.  Pt will improve gait velocity to at least 0.3m/s w/LRAD for improved gait efficiency and reduced fall risk   Baseline: 0.65 m/s wRW Goal status: INITIAL  4.  Pt will improve normal TUG to less than or equal to 20 seconds w/LRAD for improved functional mobility and decreased fall risk.  Baseline: 26.59s w/RW  Goal status: INITIAL  5.  Pt will perform floor transfer w/min A for improved functional strength and fall recovery at home  Baseline:  Goal status: INITIAL   ASSESSMENT:  CLINICAL IMPRESSION: Emphasis of skilled PT session on postural control, reactive stepping and posterior chain strength. Pt continues to require cues for upright posture w/AD and increased step clearance w/RLE. Pt very challenged by lateral stepping w/perturbations via weighted ball, frequently losing balance anteriorly due to forward flexed posture but did improve w/cues for upright posture. Pt strongly encouraged to work on walking w/emphasis on endurance and upright posture in order to work towards her personal goals. Continue POC.    OBJECTIVE IMPAIRMENTS: Abnormal gait, cardiopulmonary status limiting activity, decreased activity tolerance, decreased balance, decreased cognition, decreased endurance, decreased knowledge of condition, decreased knowledge of use of DME, decreased mobility, difficulty walking, decreased strength, decreased safety awareness, impaired perceived functional ability, impaired sensation, improper body mechanics, and pain  ACTIVITY LIMITATIONS: carrying, lifting, bending, standing, squatting, stairs, transfers, bed mobility, locomotion level, and caring for others  PARTICIPATION LIMITATIONS: meal prep,  cleaning, laundry, driving, shopping, community activity, and yard work  PERSONAL FACTORS: Fitness, Transportation, and 1-2 comorbidities: Hx of CVA are also affecting patient's functional outcome.   REHAB POTENTIAL: Good  CLINICAL DECISION MAKING: Evolving/moderate complexity  EVALUATION COMPLEXITY: Moderate  PLAN:  PT FREQUENCY: 2x/week  PT DURATION: 6 weeks  PLANNED INTERVENTIONS: 02835- PT Re-evaluation, 97750- Physical Performance Testing, 97110-Therapeutic exercises, 97530- Therapeutic activity, 97112- Neuromuscular re-education, 97535- Self Care, 02859- Manual therapy, [phone]- Gait training, [phone]- Aquatic Therapy, [phone]- Electrical stimulation (manual), [phone] (1-2 muscles), [phone] (3+ muscles)- Dry Needling, Patient/Family education, Balance training, Stair training, Joint mobilization, Spinal mobilization, Vestibular training, and DME instructions  PLAN FOR NEXT SESSION: Review HEP from previous POC and revise as needed (pt will need to work on more functional strength tasks- sit to stands, standing marches, side stepping, etc.). Work on turns!! Upright posture, endurance - seated aerobics, reaching to L side, high amplitude  movements     Marlon BRAVO Daymian Lill, PT, DPT 10/15/2023, 4:20 PM

## 2023-10-15 NOTE — Therapy (Signed)
 OUTPATIENT OCCUPATIONAL THERAPY NEURO EVALUATION  Patient Name: [name] MRN: [id number] DOB:1934/12/29, 89 y.o., female Today's Date: 10/15/2023  PCP: Auston Opal, DO REFERRING PROVIDER: Auston Opal, DO  END OF SESSION:   Past Medical History:  Diagnosis Date   Arthritis    Atrial fibrillation (HCC)    Carotid artery occlusion    Colitis    COPD (chronic obstructive pulmonary disease) (HCC)    Diverticulitis    Hypertension    Keratosis, seborrheic    Lumbar spondylosis    w/impingement L3-S1   Past Surgical History:  Procedure Laterality Date   ABDOMINAL HYSTERECTOMY     BREAST EXCISIONAL BIOPSY Left    BREAST EXCISIONAL BIOPSY Left    BREAST EXCISIONAL BIOPSY Right    BREAST SURGERY     cyst removal   CAROTID ENDARTERECTOMY Left 2010   CHOLECYSTECTOMY     EYE SURGERY Bilateral 06/2014   cataracts   JOINT REPLACEMENT     RIGHT KNEE   REPLACEMENT TOTAL KNEE Right    Patient Active Problem List   Diagnosis Date Noted   Cerebellar stroke (HCC) 12/20/2021   Vertigo 12/14/2021   CVA (cerebral vascular accident) (HCC) 12/14/2021   Hypokalemia 12/14/2021   Leukocytosis 12/14/2021   Essential hypertension 12/14/2021   Anxiety 12/14/2021   Hyperlipidemia 12/14/2021   Carotid artery disease 12/14/2021   Osteoarthritis cervical spine 09/21/2020   Compression fracture of T12 vertebra (HCC) 04/16/2018   Paroxysmal atrial fibrillation (HCC) 03/03/2018   Occlusion and stenosis of carotid artery without mention of cerebral infarction 02/05/2012    ONSET DATE: 10/04/2023 referral date   REFERRING DIAG:  I69.313 (ICD-10-CM) - Psychomotor deficit following cerebral infarction THERAPY DIAG:  No diagnosis found.  Rationale for Evaluation and Treatment: Rehabilitation  SUBJECTIVE:   SUBJECTIVE STATEMENT: I could be better, but I'm doing okay. I've got tremors in my hands. Pt accompanied by: self  PERTINENT HISTORY: A-fib, COPD, hypertension, stroke    DIAGNOSTIC FINDINGS: CTA of head/neck from 11/2021   IMPRESSION: 1. Negative for large vessel occlusion, but Positive for complex plaque at the Left ICA origin where it's difficult to exclude ADHERENT THROMBUS within the vessel lumen (series 12, image 87, but alternatively this appearance might be ulcerated plaque and/or an unusual carotid web. Subsequent stenosis numerically estimated at 60%.   2. Positive also for evidence of cervical Right ICA Fibromuscular Dysplasia (FMD).   3. But otherwise mild for age atherosclerosis, and essentially negative intracranial anterior circulation with no other arterial stenosis identified.   4. Stable CT appearance of the brain, No acute intracranial abnormality.   5. Aortic Atherosclerosis (ICD10-I70.0) and Emphysema (ICD10-J43.9). Tortuous aortic arch.   6. 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    PRECAUTIONS: Fall, severe hearing loss with B hearing aides   WEIGHT BEARING RESTRICTIONS: No  PAIN:  Are you having pain? Yes: NPRS scale: 3-4/10 Pain location: neck Pain description: constant Aggravating factors: lots of movement Relieving factors: rest  FALLS: Has patient fallen in last 6 months? Yes. Number of falls 1  LIVING ENVIRONMENT: Lives with: lives with their family and lives alone Lives in: House/apartment Stairs: Yes: External: 1 in back and 3 on front steps steps; on right going up, on left going up, and can reach both Has following equipment at home: Single point cane, Walker - 2 wheeled, shower chair, and Grab bars (walk in shower, niece's husband converted the bathtub)  PLOF: Mod I for independence, Leisure included: reading, playing computer games,  and was driving  PATIENT GOALS: I'd like to be able to play my computer games better. I have difficulty with jigsaw puzzles but I do fine with Solitaire and card games on the computer.  OBJECTIVE:  Note: Objective measures were  completed at Evaluation unless otherwise noted.  HAND DOMINANCE: Right  ADLs: Overall ADLs: Independent, but reports extended time required at times for dressing tasks. Transfers/ambulation related to ADLs: Mod I Eating: Mod I Grooming: Mod I UB Dressing: Independent with extended time LB Dressing: Independent with extended time Toileting: Independent with extended time Bathing: Independent with extended time Tub Shower transfers: Independent with extended time Equipment: See above  IADLs: reports not being able to complete sweeping or scrubbing floor d/t poor balance. Shopping: Mod I Light housekeeping: reports not being able to complete sweeping or scrubbing floor d/t poor balance. Meal Prep: Mod I Community mobility: I Medication management: Mod I Financial management: Niece has taken over finances since most recent stroke. Handwriting: 90% legible and shaky handwriting, pt reports having increased tremors But my handwriting has always been not so neat.   MOBILITY STATUS: difficulty with turns and uses FWW.   POSTURE COMMENTS:  rounded shoulders, forward head, and increased thoracic kyphosis Sitting balance: WFL  ACTIVITY TOLERANCE: Activity tolerance: NT  FUNCTIONAL OUTCOME MEASURES:   UPPER EXTREMITY ROM:    Active ROM Right eval Left eval  Shoulder flexion    Shoulder abduction    Shoulder adduction    Shoulder extension    Shoulder internal rotation    Shoulder external rotation    Elbow flexion    Elbow extension    Wrist flexion    Wrist extension    Wrist ulnar deviation    Wrist radial deviation    Wrist pronation    Wrist supination    (Blank rows = not tested)  UPPER EXTREMITY MMT:     MMT Right eval Left eval  Shoulder flexion    Shoulder abduction    Shoulder adduction    Shoulder extension    Shoulder internal rotation    Shoulder external rotation    Middle trapezius    Lower trapezius    Elbow flexion    Elbow extension     Wrist flexion    Wrist extension    Wrist ulnar deviation    Wrist radial deviation    Wrist pronation    Wrist supination    (Blank rows = not tested)  HAND FUNCTION: Grip strength: Right: 33 lbs; Left: 29 lbs  COORDINATION: 9 Hole Peg test: Right: 25.69 sec; Left: 30.01 sec  SENSATION: B peripheral neuropathy noted in PT notes. WFL  EDEMA: none  MUSCLE TONE: RUE: Within functional limits and LUE: Within functional limits  COGNITION: Overall cognitive status: Within functional limits for tasks assessed  VISION: Subjective report: Sometimes I have trouble. But it's hard to describe, but really I have no concerns with my vision.  Baseline vision: Wears glasses for distance only and reading Visual history: cataracts; has had surgery for this  VISION ASSESSMENT: To be further assessed in functional context.   Patient has difficulty with following activities due to following visual impairments: To be further assessed in functional context.   PERCEPTION: WFL  PRAXIS: Not tested  OBSERVATIONS: Pt with tremors in B hands, affecting ability to complete desired leisure tasks including computer puzzles.  TREATMENT DATE: 10/15/23   Educated and reviewed with pt purpose of OT and goals/POC. Educated pt in where to obtain weighted bracelet or wrist weight (be sure to f/u and be sure that pt does not wear at all hours of the day and only for desired leisure tasks (ie computer games). Began FM coordination HEP, pt educated in activities with coins (stacking, picking up coins one by one) with B hands. Detailed HEP to be prescribed next tx session.    PATIENT EDUCATION: Education details: Educated pt in FM coordination activities with coins (stacking, picking up one by one) with B hands Person educated: Patient Education method: Explanation Education  comprehension: verbalized understanding  HOME EXERCISE PROGRAM: To be prescribed   GOALS: Goals reviewed with patient? Yes  SHORT TERM GOALS: Target date: 11/14/23  Pt will be independent with FM coordination HEP Baseline: To be educated  Goal status: INITIAL  2.  Pt will demonstrate improved FM coordination by a score of no more 20.7 seconds on R hand on 9 HPT Baseline: 25.69 Goal status: INITIAL  3.  Pt will demonstrate improved FM coordination in L hand by a score of no more than 23 seconds on 9HPT Baseline: 30.01 Goal status: INITIAL   LONG TERM GOALS: Target date: 11/26/23  Pt will be independent with educated tremor mgmt Baseline: To be educated  Goal status: INITIAL  2.  Pt will demonstrate good understanding of compensatory sweeping technique Baseline: To be educated  Goal status: INITIAL    ASSESSMENT:  CLINICAL IMPRESSION: Patient is a 89 y.o. female who was seen today for occupational therapy evaluation for psychomotor deficits and issues with gait. Hx includes CVA, cerebellar stroke, HLD, CAD, afib, COPD, HTN. Patient currently presents below baseline level of functioning demonstrating functional deficits and impairments as noted below. Pt would benefit from skilled OT services in the outpatient setting to work on impairments as noted below to help pt return to PLOF as able.     PERFORMANCE DEFICITS: in functional skills including coordination, Fine motor control, and tremors, and psychosocial skills including routines and behaviors.   IMPAIRMENTS: are limiting patient from IADLs and leisure.   CO-MORBIDITIES: may have co-morbidities  that affects occupational performance. Patient will benefit from skilled OT to address above impairments and improve overall function.  MODIFICATION OR ASSISTANCE TO COMPLETE EVALUATION: Min-Moderate modification of tasks or assist with assess necessary to complete an evaluation.  OT OCCUPATIONAL PROFILE AND HISTORY: Detailed  assessment: Review of records and additional review of physical, cognitive, psychosocial history related to current functional performance.  CLINICAL DECISION MAKING: Moderate - several treatment options, min-mod task modification necessary  REHAB POTENTIAL: Fair noted not always adhering to HEPs with PT  EVALUATION COMPLEXITY: Moderate    PLAN:  OT FREQUENCY: 1x/week  OT DURATION: 6 weeks  PLANNED INTERVENTIONS: 97168 OT Re-evaluation, 97535 self care/ADL training, 02889 therapeutic exercise, 97530 therapeutic activity, 97112 neuromuscular re-education, 97140 manual therapy, 97760 Orthotic Initial, [id number] Orthotic/Prosthetic subsequent, psychosocial skills training, energy conservation, coping strategies training, patient/family education, and DME and/or AE instructions  RECOMMENDED OTHER SERVICES: none at this time  CONSULTED AND AGREED WITH PLAN OF CARE: Patient  PLAN FOR NEXT SESSION:  Tremor handout continue FM Putty F/u on weighted bracelet or wrist weight (if obtained instruct to only wear for computer game activities) Compensatory sweeping education   [place], OT 10/15/2023, 12:36 PM

## 2023-10-17 ENCOUNTER — Ambulatory Visit: Payer: Medicare (Managed Care) | Admitting: Physical Therapy

## 2023-10-22 ENCOUNTER — Ambulatory Visit: Payer: Medicare (Managed Care)

## 2023-10-22 ENCOUNTER — Ambulatory Visit: Payer: Medicare (Managed Care) | Admitting: Physical Therapy

## 2023-10-22 VITALS — BP 151/63 | HR 61

## 2023-10-22 DIAGNOSIS — R2681 Unsteadiness on feet: Secondary | ICD-10-CM

## 2023-10-22 DIAGNOSIS — R278 Other lack of coordination: Secondary | ICD-10-CM

## 2023-10-22 DIAGNOSIS — R293 Abnormal posture: Secondary | ICD-10-CM

## 2023-10-22 DIAGNOSIS — R29898 Other symptoms and signs involving the musculoskeletal system: Secondary | ICD-10-CM

## 2023-10-22 DIAGNOSIS — M6281 Muscle weakness (generalized): Secondary | ICD-10-CM

## 2023-10-22 NOTE — Therapy (Signed)
 OUTPATIENT PHYSICAL THERAPY NEURO TREATMENT   Patient Name: [name] MRN: [id number] DOB:[birth year], 89 y.o., female Today's Date: 10/22/2023   PCP: Auston Opal, DO REFERRING PROVIDER: Auston Opal, DO  END OF SESSION:  PT End of Session - 10/22/23 1448     Visit Number 7    Number of Visits 13    Date for Recertification  11/08/23    Authorization Type [employer]    PT Start Time 1447    PT Stop Time 1527    PT Time Calculation (min) 40 min    Equipment Utilized During Treatment Gait belt    Activity Tolerance Patient tolerated treatment well    Behavior During Therapy WFL for tasks assessed/performed              Past Medical History:  Diagnosis Date   Arthritis    Atrial fibrillation (HCC)    Carotid artery occlusion    Colitis    COPD (chronic obstructive pulmonary disease) (HCC)    Diverticulitis    Hypertension    Keratosis, seborrheic    Lumbar spondylosis    w/impingement L3-S1   Past Surgical History:  Procedure Laterality Date   ABDOMINAL HYSTERECTOMY     BREAST EXCISIONAL BIOPSY Left    BREAST EXCISIONAL BIOPSY Left    BREAST EXCISIONAL BIOPSY Right    BREAST SURGERY     cyst removal   CAROTID ENDARTERECTOMY Left 2010   CHOLECYSTECTOMY     EYE SURGERY Bilateral 06/2014   cataracts   JOINT REPLACEMENT     RIGHT KNEE   REPLACEMENT TOTAL KNEE Right    Patient Active Problem List   Diagnosis Date Noted   Cerebellar stroke (HCC) 12/20/2021   Vertigo 12/14/2021   CVA (cerebral vascular accident) (HCC) 12/14/2021   Hypokalemia 12/14/2021   Leukocytosis 12/14/2021   Essential hypertension 12/14/2021   Anxiety 12/14/2021   Hyperlipidemia 12/14/2021   Carotid artery disease 12/14/2021   Osteoarthritis cervical spine 09/21/2020   Compression fracture of T12 vertebra (HCC) 04/16/2018   Paroxysmal atrial fibrillation (HCC) 03/03/2018   Occlusion and stenosis of carotid artery without mention of cerebral infarction  02/05/2012    ONSET DATE: 08/13/2023 (referral)  REFERRING DIAG: R26.89 (ICD-10-CM) - Other abnormalities of gait and mobility  THERAPY DIAG:  Other lack of coordination  Muscle weakness (generalized)  Other symptoms and signs involving the musculoskeletal system  Abnormal posture  Unsteadiness on feet  Rationale for Evaluation and Treatment: Rehabilitation  SUBJECTIVE:  SUBJECTIVE STATEMENT: Pt presents w/RW, she denies falls. States she did not feel well Thursday or Friday last week but is feeling okay today. Was able to pump her own gas for first time in a while yesterday, that made me feel optimistic.   Pt accompanied by: Self   PERTINENT HISTORY: A-fib, COPD, hypertension, stroke  PAIN:  Are you having pain? Yes: NPRS scale: 3-4/10 Pain location: Neck Pain description: achy Pt reports frequent neck pain 2/2 arthritis   PRECAUTIONS: Fall; pt needs to visualize mouth to fully comprehend what she is hearing - Severe hearing loss w/ bilateral hearing aides  RED FLAGS: None   WEIGHT BEARING RESTRICTIONS: No  FALLS: Has patient fallen in last 6 months? Yes. Number of falls 1  LIVING ENVIRONMENT: Lives with: lives alone Lives in: House/apartment Stairs: Yes: External: 1 in back and 3 in front steps; on right going up, on left going up, and can reach both Has following equipment at home: Single point cane, Walker - 2 wheeled, shower chair, and Grab bars  PLOF: Requires assistive device for independence, Leisure: reading, playing games on the computer, and is driving  PATIENT GOALS: my balance   OBJECTIVE:  Note: Objective measures were completed at Evaluation unless otherwise noted.  DIAGNOSTIC FINDINGS: CTA of head/neck from 11/2021  IMPRESSION: 1. Negative for large  vessel occlusion, but Positive for complex plaque at the Left ICA origin where it's difficult to exclude ADHERENT THROMBUS within the vessel lumen (series 12, image 87, but alternatively this appearance might be ulcerated plaque and/or an unusual carotid web. Subsequent stenosis numerically estimated at 60%.   2. Positive also for evidence of cervical Right ICA Fibromuscular Dysplasia (FMD).   3. But otherwise mild for age atherosclerosis, and essentially negative intracranial anterior circulation with no other arterial stenosis identified.   4. Stable CT appearance of the brain, No acute intracranial abnormality.   5. Aortic Atherosclerosis (ICD10-I70.0) and Emphysema (ICD10-J43.9). Tortuous aortic arch.   6. 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  COGNITION: Overall cognitive status: Within functional limits for tasks assessed   SENSATION: Pt reports bilateral peripheral neuropathy    EDEMA: Pt reports frequent swelling     POSTURE: rounded shoulders, forward head, and increased thoracic kyphosis  LOWER EXTREMITY ROM:     Active  Right Eval Left Eval  Hip flexion    Hip extension    Hip abduction    Hip adduction    Hip internal rotation    Hip external rotation    Knee flexion    Knee extension    Ankle dorsiflexion    Ankle plantarflexion    Ankle inversion    Ankle eversion     (Blank rows = not tested)  LOWER EXTREMITY MMT:  Tested in seated position   MMT Right Eval Left Eval  Hip flexion 4- 4  Hip extension    Hip abduction 4+ 4  Hip adduction 4 4-  Hip internal rotation    Hip external rotation    Knee flexion 5 4+  Knee extension 5 4+  Ankle dorsiflexion 5 5  Ankle plantarflexion    Ankle inversion    Ankle eversion    (Blank rows = not tested)  BED MOBILITY:  Not tested Pt reports difficulty due to high height of bed. Wants to get rid of her headboard but is unable to do this on her own    TRANSFERS: Sit to stand: SBA  Assistive device utilized: Environmental consultant - 2  wheeled     Stand to sit: SBA  Assistive device utilized: Environmental consultant - 2 wheeled     Bilateral genu valgus, weight shift to R side, heavy BUE reliance   RAMP:  Not tested  CURB:  Not tested  STAIRS: Not tested GAIT: Gait pattern: step through pattern, decreased step length- Right, decreased stride length, decreased hip/knee flexion- Right, decreased ankle dorsiflexion- Right, lateral hip instability, trunk flexed, and poor foot clearance- Right Distance walked: Various clinic distances  Assistive device utilized: Walker - 2 wheeled Level of assistance: SBA Comments: Min cues to stay close to RW to facilitate upright posture and to stay inside RW when turning. Min cues to reduce scuffing of R foot   FUNCTIONAL TESTS:   [facility] PT Assessment - 10/22/23 1505       Transfers   Five time sit to stand comments  22.65s no UE support      Standardized Balance Assessment   10 Meter Walk 0.48 m/s w/RW   10m over 20.9s     Timed Up and Go Test   Normal TUG (seconds) 31.22   w/RW, no verbal cues required         VITALS  Vitals:   10/22/23 1510  BP: (!) 151/63  Pulse: 61                                                                                                                                   TREATMENT:    Ther Act/Physical Performance/Self-care  Assessed vitals in LUE (see above) and systolic BP elevated but within limits for session.  SciFit multi-peaks level 8.5 for 8 minutes using BUE/BLEs for neural priming for reciprocal movement, dynamic cardiovascular warmup and increased amplitude of stepping. RPE of 4/10 following activity    [facility] PT Assessment - 10/22/23 1505       Transfers   Five time sit to stand comments  22.65s no UE support      Standardized Balance Assessment   10 Meter Walk 0.48 m/s w/RW   32m over 20.9s     Timed Up and Go Test   Normal TUG (seconds) 31.22   w/RW, no verbal  cues required        At ballet bar, alt fwd adv/retreat over 4 beam w/LUE support on rail for improved step clearance and single leg stability. Pt performed x8 reps per side, frequently knocking beam over w/LLE. On final rep, pt lost balance to L side due to fatigue, requiring mod A to prevent a fall. Guided pt back to chair and pt reports being fatigued today. Also noted pt wearing slip on shoes w/no backing, so recommended full coverage shoes for therapy. Pt verbalized understanding.   PATIENT EDUCATION: Education details: Continue HEP, goal results, importance of proper footwear  Person educated: Patient Education method: Explanation, Demonstration, and Verbal cues Education comprehension: verbalized understanding, returned demonstration, verbal cues required, and needs further education  HOME EXERCISE PROGRAM: To be reviewed from previous POC: [id number]   Chair yoga (replace one day of HEP w/ this): -Diaphragmatic breathing x10 -cat/cow x10 -trunk circles x10 CW > x10 CCW -Sun salutation x10 w/ paced breathing -Sun salutation w/ trunk rotation x10 each side -Assisted neck stretches 5x5 second hold each side -High altar side leans 5x3 seconds each side -Ankle to knee stretch no overpressure x45 seconds each side -Goddess w/ a twist x3 each side no hold -Forward fold x5 w/ paced breathing  GOALS: Goals reviewed with patient? Yes  SHORT TERM GOALS: Target date: 10/18/2023   Pt will be independent with initial HEP for improved strength, balance, transfers and gait.  Baseline: to be reviewed/updated from previous POC  Goal status: MET  2.  Pt will improve 5 x STS to less than or equal to 16 seconds w/improved anterior weight shift to demonstrate improved functional strength and transfer efficiency.   Baseline: 17.6s w/BUE support and poor body mechanics; 22.65s w/no UE support (9/30) Goal status: NOT MET  3.  Pt will improve gait velocity to at least 0.72 m/s w/LRAD for  improved gait efficiency and independence   Baseline: 0.65 m/s w/RW; 0.48 m/s w/RW (9/30) Goal status: NOT MET   4.  Pt will improve normal TUG to less than or equal to 23 seconds w/LRAD for improved functional mobility and decreased fall risk.  Baseline: 26.59s w/RW; 31.22s w/RW (9/30) Goal status: NOT MET   LONG TERM GOALS: Target date: 11/01/2023   Pt will be independent with final HEP for improved strength, balance, transfers and gait.  Baseline:  Goal status: INITIAL  2.  Pt will improve 5 x STS to less than or equal to 18 seconds w/proper body mechanics and no UE support to demonstrate improved functional strength and transfer efficiency.   Baseline: 17.6s w/improper body mechanics; 22.65s w/no UE support (9/30) Goal status: REVISED  3.  Pt will improve gait velocity to at least 0.23m/s w/LRAD for improved gait efficiency and reduced fall risk   Baseline: 0.65 m/s wRW; 0.48 m/s w/RW (9/30) Goal status: REVISED  4.  Pt will improve normal TUG to less than or equal to 23 seconds w/LRAD for improved functional mobility and decreased fall risk.  Baseline: 26.59s w/RW; 31.22s w/RW (9/30) Goal status: REVISED  5.  Pt will perform floor transfer w/min A for improved functional strength and fall recovery at home  Baseline:  Goal status: INITIAL   ASSESSMENT:  CLINICAL IMPRESSION: Emphasis of skilled PT session on STG assessment, increased step clearance and single leg stability. Pt has met 1 of 4 STGs, reporting independence and compliance w/HEP. Pt did not meet her 5x STS, TUG or gait speed goal w/use of RW this date and actually scored lower than on initial eval. Pt was wearing backless shoes today, so unsure if this contributed. Of note, pt was able to perform 5x STS without UE support today and demonstrates improved postural control w/RW. Pt continues to require verbal cues for increased step clearance w/RLE and pt able to hear her foot scuff on ground today and initiate  self-cueing to increase step clearance. Pt encouraged to continue working on sit to stands w/reduced UE reliance at home and educated on importance of proper footwear. Continue POC.    OBJECTIVE IMPAIRMENTS: Abnormal gait, cardiopulmonary status limiting activity, decreased activity tolerance, decreased balance, decreased cognition, decreased endurance, decreased knowledge of condition, decreased knowledge of use of DME, decreased mobility, difficulty walking, decreased strength, decreased safety  awareness, impaired perceived functional ability, impaired sensation, improper body mechanics, and pain  ACTIVITY LIMITATIONS: carrying, lifting, bending, standing, squatting, stairs, transfers, bed mobility, locomotion level, and caring for others  PARTICIPATION LIMITATIONS: meal prep, cleaning, laundry, driving, shopping, community activity, and yard work  PERSONAL FACTORS: Fitness, Transportation, and 1-2 comorbidities: Hx of CVA are also affecting patient's functional outcome.   REHAB POTENTIAL: Good  CLINICAL DECISION MAKING: Evolving/moderate complexity  EVALUATION COMPLEXITY: Moderate  PLAN:  PT FREQUENCY: 2x/week  PT DURATION: 6 weeks  PLANNED INTERVENTIONS: 02835- PT Re-evaluation, 97750- Physical Performance Testing, 97110-Therapeutic exercises, 97530- Therapeutic activity, 97112- Neuromuscular re-education, 97535- Self Care, 02859- Manual therapy, [phone]- Gait training, [phone]- Aquatic Therapy, [phone]- Electrical stimulation (manual), [phone] (1-2 muscles), [phone] (3+ muscles)- Dry Needling, Patient/Family education, Balance training, Stair training, Joint mobilization, Spinal mobilization, Vestibular training, and DME instructions  PLAN FOR NEXT SESSION: Review HEP from previous POC and revise as needed (pt will need to work on more functional strength tasks- sit to stands, standing marches, side stepping, etc.). Work on turns!! Upright posture, endurance - seated aerobics, reaching to L side,  high amplitude movements, increased step clearance w/RLE     Danisa Kopec E Danyal Whitenack, PT, DPT 10/22/2023, 3:30 PM

## 2023-10-22 NOTE — Therapy (Signed)
 OUTPATIENT OCCUPATIONAL THERAPY NEURO TREATMENT  Patient Name: [name] MRN: [id number] DOB:1934/06/06, 89 y.o., female Today's Date: 10/22/2023  PCP: Auston Opal, DO REFERRING PROVIDER: Auston Opal, DO  END OF SESSION:  OT End of Session - 10/22/23 1638     Visit Number 2    Number of Visits 6    Date for Recertification  11/26/23    Authorization Type [employer]    Progress Note Due on Visit 10    OT Start Time 1401    OT Stop Time 1445    OT Time Calculation (min) 44 min    Activity Tolerance Patient tolerated treatment well    Behavior During Therapy WFL for tasks assessed/performed          Past Medical History:  Diagnosis Date   Arthritis    Atrial fibrillation (HCC)    Carotid artery occlusion    Colitis    COPD (chronic obstructive pulmonary disease) (HCC)    Diverticulitis    Hypertension    Keratosis, seborrheic    Lumbar spondylosis    w/impingement L3-S1   Past Surgical History:  Procedure Laterality Date   ABDOMINAL HYSTERECTOMY     BREAST EXCISIONAL BIOPSY Left    BREAST EXCISIONAL BIOPSY Left    BREAST EXCISIONAL BIOPSY Right    BREAST SURGERY     cyst removal   CAROTID ENDARTERECTOMY Left 2010   CHOLECYSTECTOMY     EYE SURGERY Bilateral 06/2014   cataracts   JOINT REPLACEMENT     RIGHT KNEE   REPLACEMENT TOTAL KNEE Right    Patient Active Problem List   Diagnosis Date Noted   Cerebellar stroke (HCC) 12/20/2021   Vertigo 12/14/2021   CVA (cerebral vascular accident) (HCC) 12/14/2021   Hypokalemia 12/14/2021   Leukocytosis 12/14/2021   Essential hypertension 12/14/2021   Anxiety 12/14/2021   Hyperlipidemia 12/14/2021   Carotid artery disease 12/14/2021   Osteoarthritis cervical spine 09/21/2020   Compression fracture of T12 vertebra (HCC) 04/16/2018   Paroxysmal atrial fibrillation (HCC) 03/03/2018   Occlusion and stenosis of carotid artery without mention of cerebral infarction 02/05/2012    ONSET DATE: 10/04/2023  referral date   REFERRING DIAG:  I69.313 (ICD-10-CM) - Psychomotor deficit following cerebral infarction THERAPY DIAG:  Other lack of coordination  Muscle weakness (generalized)  Other symptoms and signs involving the musculoskeletal system  Rationale for Evaluation and Treatment: Rehabilitation  SUBJECTIVE:   SUBJECTIVE STATEMENT: I pumped my own gas the other day, it went okay.  Pt accompanied by: self  PERTINENT HISTORY: A-fib, COPD, hypertension, stroke   DIAGNOSTIC FINDINGS: CTA of head/neck from 11/2021   IMPRESSION: 1. Negative for large vessel occlusion, but Positive for complex plaque at the Left ICA origin where it's difficult to exclude ADHERENT THROMBUS within the vessel lumen (series 12, image 87, but alternatively this appearance might be ulcerated plaque and/or an unusual carotid web. Subsequent stenosis numerically estimated at 60%.   2. Positive also for evidence of cervical Right ICA Fibromuscular Dysplasia (FMD).   3. But otherwise mild for age atherosclerosis, and essentially negative intracranial anterior circulation with no other arterial stenosis identified.   4. Stable CT appearance of the brain, No acute intracranial abnormality.   5. Aortic Atherosclerosis (ICD10-I70.0) and Emphysema (ICD10-J43.9). Tortuous aortic arch.   6. 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    PRECAUTIONS: Fall, severe hearing loss with B hearing aides   WEIGHT BEARING RESTRICTIONS: No  PAIN:  Are you having pain?  Yes: NPRS scale: 3-4/10 Pain location: neck Pain description: constant Aggravating factors: lots of movement Relieving factors: rest  FALLS: Has patient fallen in last 6 months? Yes. Number of falls 1  LIVING ENVIRONMENT: Lives with: lives with their family and lives alone Lives in: House/apartment Stairs: Yes: External: 1 in back and 3 on front steps steps; on right going up, on left going up, and can reach  both Has following equipment at home: Single point cane, Walker - 2 wheeled, shower chair, and Grab bars (walk in shower, niece's husband converted the bathtub)  PLOF: Mod I for independence, Leisure included: reading, playing computer games, and was driving  PATIENT GOALS: I'd like to be able to play my computer games better. I have difficulty with jigsaw puzzles but I do fine with Solitaire and card games on the computer.  OBJECTIVE:  Note: Objective measures were completed at Evaluation unless otherwise noted.  HAND DOMINANCE: Right  ADLs: Overall ADLs: Independent, but reports extended time required at times for dressing tasks. Transfers/ambulation related to ADLs: Mod I Eating: Mod I Grooming: Mod I UB Dressing: Independent with extended time LB Dressing: Independent with extended time Toileting: Independent with extended time Bathing: Independent with extended time Tub Shower transfers: Independent with extended time Equipment: See above  IADLs: reports not being able to complete sweeping or scrubbing floor d/t poor balance. Shopping: Mod I Light housekeeping: reports not being able to complete sweeping or scrubbing floor d/t poor balance. Meal Prep: Mod I Community mobility: I Medication management: Mod I Financial management: Niece has taken over finances since most recent stroke. Handwriting: 90% legible and shaky handwriting, pt reports having increased tremors But my handwriting has always been not so neat.   MOBILITY STATUS: difficulty with turns and uses FWW.   POSTURE COMMENTS:  rounded shoulders, forward head, and increased thoracic kyphosis Sitting balance: WFL  ACTIVITY TOLERANCE: Activity tolerance: NT  FUNCTIONAL OUTCOME MEASURES:   UPPER EXTREMITY ROM:    Active ROM Right eval Left eval  Shoulder flexion    Shoulder abduction    Shoulder adduction    Shoulder extension    Shoulder internal rotation    Shoulder external rotation    Elbow  flexion    Elbow extension    Wrist flexion    Wrist extension    Wrist ulnar deviation    Wrist radial deviation    Wrist pronation    Wrist supination    (Blank rows = not tested)  UPPER EXTREMITY MMT:     MMT Right eval Left eval  Shoulder flexion    Shoulder abduction    Shoulder adduction    Shoulder extension    Shoulder internal rotation    Shoulder external rotation    Middle trapezius    Lower trapezius    Elbow flexion    Elbow extension    Wrist flexion    Wrist extension    Wrist ulnar deviation    Wrist radial deviation    Wrist pronation    Wrist supination    (Blank rows = not tested)  HAND FUNCTION: Grip strength: Right: 33 lbs; Left: 29 lbs  COORDINATION: 9 Hole Peg test: Right: 25.69 sec; Left: 30.01 sec  SENSATION: B peripheral neuropathy noted in PT notes. WFL  EDEMA: none  MUSCLE TONE: RUE: Within functional limits and LUE: Within functional limits  COGNITION: Overall cognitive status: Within functional limits for tasks assessed  VISION: Subjective report: Sometimes I have trouble. But it's hard to  describe, but really I have no concerns with my vision.  Baseline vision: Wears glasses for distance only and reading Visual history: cataracts; has had surgery for this  VISION ASSESSMENT: To be further assessed in functional context.   Patient has difficulty with following activities due to following visual impairments: To be further assessed in functional context.   PERCEPTION: WFL  PRAXIS: Not tested  OBSERVATIONS: Pt with tremors in B hands, affecting ability to complete desired leisure tasks including computer puzzles.                                                                                                                              TREATMENT DATE: 10/22/23   Educated and reviewed with pt purpose of OT and goals/POC. Educated pt in where to obtain weighted bracelet or wrist weight, pt verbalized understanding of only  wearing during puzzle games/leisure tasks. Educated pt in obtaining [employer] account and pros and cons of obtaining a Microbiologist. Educated pt in compensatory sweeping technique and pt demonstrated good understanding, able to bend down to pick up simulated dust pan with no balance concerns. Pt reports having chairs with wheels in the home, instructed to have family move one of those chairs to areas that require sweeping. Educated in coordination activities, see pt instruction for further details.    PATIENT EDUCATION: Education details: Compensatory sweeping, tremor mgmt, and FM coordination activities  Person educated: Patient Education method: Explanation, Demonstration, and Handouts Education comprehension: verbalized understanding and returned demonstration  HOME EXERCISE PROGRAM: To be prescribed   GOALS: Goals reviewed with patient? Yes  SHORT TERM GOALS: Target date: 11/14/23  Pt will be independent with FM coordination HEP Baseline: To be educated  Goal status: INITIAL  2.  Pt will demonstrate improved FM coordination by a score of no more 20.7 seconds on R hand on 9 HPT Baseline: 25.69 Goal status: INITIAL  3.  Pt will demonstrate improved FM coordination in L hand by a score of no more than 23 seconds on 9HPT Baseline: 30.01 Goal status: INITIAL   LONG TERM GOALS: Target date: 11/26/23  Pt will be independent with educated tremor mgmt Baseline: 10/22/23 Handout provided and educated  Goal status: INITIAL  2.  Pt will demonstrate good understanding of compensatory sweeping technique Baseline: 10/22/23 educated, continue to follow up   Goal status: INITIAL    ASSESSMENT:  CLINICAL IMPRESSION: Patient is a 89 y.o. female who was seen today for occupational therapy evaluation for psychomotor deficits and issues with gait. Hx includes CVA, cerebellar stroke, HLD, CAD, afib, COPD, HTN. Patient currently presents below baseline level of functioning demonstrating  functional deficits and impairments as noted below. Pt would benefit from skilled OT services in the outpatient setting to work on impairments as noted below to help pt return to PLOF as able.     PERFORMANCE DEFICITS: in functional skills including coordination, Fine motor control, and tremors, and psychosocial skills including routines and  behaviors.   IMPAIRMENTS: are limiting patient from IADLs and leisure.   CO-MORBIDITIES: may have co-morbidities  that affects occupational performance. Patient will benefit from skilled OT to address above impairments and improve overall function.  MODIFICATION OR ASSISTANCE TO COMPLETE EVALUATION: Min-Moderate modification of tasks or assist with assess necessary to complete an evaluation.  OT OCCUPATIONAL PROFILE AND HISTORY: Detailed assessment: Review of records and additional review of physical, cognitive, psychosocial history related to current functional performance.  CLINICAL DECISION MAKING: Moderate - several treatment options, min-mod task modification necessary  REHAB POTENTIAL: Fair noted not always adhering to HEPs with PT  EVALUATION COMPLEXITY: Moderate    PLAN:  OT FREQUENCY: 1x/week  OT DURATION: 6 weeks  PLANNED INTERVENTIONS: 97168 OT Re-evaluation, 97535 self care/ADL training, 02889 therapeutic exercise, 97530 therapeutic activity, 97112 neuromuscular re-education, 97140 manual therapy, 97760 Orthotic Initial, [id number] Orthotic/Prosthetic subsequent, psychosocial skills training, energy conservation, coping strategies training, patient/family education, and DME and/or AE instructions  RECOMMENDED OTHER SERVICES: none at this time  CONSULTED AND AGREED WITH PLAN OF CARE: Patient  PLAN FOR NEXT SESSION:  continue FM Putty F/u compensatory sweeping education   [place], OT 10/22/2023, 4:39 PM

## 2023-10-22 NOTE — Patient Instructions (Addendum)
 Coordination Activities  Perform the following activities for 10-15 minutes 1 times per day with both hand(s).  Rotate ball in fingertips (clockwise and counter-clockwise). Flip cards 1 at a time as fast as you can. Deal cards with your thumb (Hold deck in hand and push card off top with thumb). Pick up coins one at a time until you get 5-10 in your hand, then move coins from palm to fingertips to stack one at a time. Twirl pen between fingers. Practice writing and/or typing.  Compensation Strategies for Tremors  When eating, try the following: Eat out of bowls, divided plates, or use a plate guard (available at a medical supply store) and eat with a spoon so that you have an edge to scoop up food. Try raising your plate so that there is less distance between the plate and mouth. Try stabilizing elbows on the table or against your body. Use utensil with built-up/larger grips as they are easier to hold.  When writing, try the following:   Stabilize forearm on the table Take your time as rushing/being stressed can increase tremors. Try a felt-tipped pen, it does not glide as much.  Avoid gel pens (they move too much). Consider using pre-printed labels with your name and address (carry them with you when you go out) or you can get stamps with your address or signature on it. Use a small tape recorder to record messages/reminders for yourself. Use pens with bigger grips.  When brushing your teeth, putting on make-up, or styling hair, try the following: Use an electric toothbrush. Use items with built-up grips. Stabilize your elbows against your body or on the counter. Use long-handled brushes/combs. Use a hair dryer with a stand.  In general: Avoid stress, fatigue, or rushing as this can increase tremors. Sit down for activities that require more control/coordination.

## 2023-10-24 ENCOUNTER — Encounter: Payer: Self-pay | Admitting: Physical Therapy

## 2023-10-24 ENCOUNTER — Ambulatory Visit: Payer: Medicare (Managed Care) | Admitting: Occupational Therapy

## 2023-10-24 ENCOUNTER — Ambulatory Visit: Payer: Medicare (Managed Care) | Attending: Family Medicine | Admitting: Physical Therapy

## 2023-10-24 VITALS — BP 142/60 | HR 56

## 2023-10-24 DIAGNOSIS — R2681 Unsteadiness on feet: Secondary | ICD-10-CM | POA: Diagnosis not present

## 2023-10-24 DIAGNOSIS — M6281 Muscle weakness (generalized): Secondary | ICD-10-CM | POA: Diagnosis not present

## 2023-10-24 DIAGNOSIS — R29898 Other symptoms and signs involving the musculoskeletal system: Secondary | ICD-10-CM | POA: Insufficient documentation

## 2023-10-24 DIAGNOSIS — Z9181 History of falling: Secondary | ICD-10-CM | POA: Insufficient documentation

## 2023-10-24 DIAGNOSIS — R278 Other lack of coordination: Secondary | ICD-10-CM | POA: Diagnosis not present

## 2023-10-24 DIAGNOSIS — R293 Abnormal posture: Secondary | ICD-10-CM | POA: Diagnosis not present

## 2023-10-24 DIAGNOSIS — R269 Unspecified abnormalities of gait and mobility: Secondary | ICD-10-CM | POA: Diagnosis not present

## 2023-10-24 DIAGNOSIS — I639 Cerebral infarction, unspecified: Secondary | ICD-10-CM | POA: Diagnosis not present

## 2023-10-24 NOTE — Therapy (Signed)
 OUTPATIENT OCCUPATIONAL THERAPY NEURO TREATMENT  Patient Name: [name] MRN: [id number] DOB:1934-05-19, 89 y.o., female Today's Date: 10/24/2023  PCP: Auston Opal, DO REFERRING PROVIDER: Auston Opal, DO  END OF SESSION:  OT End of Session - 10/24/23 1531     Visit Number 3    Number of Visits 6    Date for Recertification  11/26/23    Authorization Type [employer]    Progress Note Due on Visit 10    OT Start Time 1549    OT Stop Time 1627    OT Time Calculation (min) 38 min    Activity Tolerance Patient tolerated treatment well    Behavior During Therapy WFL for tasks assessed/performed         Past Medical History:  Diagnosis Date   Arthritis    Atrial fibrillation (HCC)    Carotid artery occlusion    Colitis    COPD (chronic obstructive pulmonary disease) (HCC)    Diverticulitis    Hypertension    Keratosis, seborrheic    Lumbar spondylosis    w/impingement L3-S1   Past Surgical History:  Procedure Laterality Date   ABDOMINAL HYSTERECTOMY     BREAST EXCISIONAL BIOPSY Left    BREAST EXCISIONAL BIOPSY Left    BREAST EXCISIONAL BIOPSY Right    BREAST SURGERY     cyst removal   CAROTID ENDARTERECTOMY Left 2010   CHOLECYSTECTOMY     EYE SURGERY Bilateral 06/2014   cataracts   JOINT REPLACEMENT     RIGHT KNEE   REPLACEMENT TOTAL KNEE Right    Patient Active Problem List   Diagnosis Date Noted   Cerebellar stroke (HCC) 12/20/2021   Vertigo 12/14/2021   CVA (cerebral vascular accident) (HCC) 12/14/2021   Hypokalemia 12/14/2021   Leukocytosis 12/14/2021   Essential hypertension 12/14/2021   Anxiety 12/14/2021   Hyperlipidemia 12/14/2021   Carotid artery disease 12/14/2021   Osteoarthritis cervical spine 09/21/2020   Compression fracture of T12 vertebra (HCC) 04/16/2018   Paroxysmal atrial fibrillation (HCC) 03/03/2018   Occlusion and stenosis of carotid artery without mention of cerebral infarction 02/05/2012   ONSET DATE: 10/04/2023  referral date   REFERRING DIAG:  I69.313 (ICD-10-CM) - Psychomotor deficit following cerebral infarction THERAPY DIAG:  Other lack of coordination  Muscle weakness (generalized)  Other symptoms and signs involving the musculoskeletal system  Cerebellar stroke (HCC)  Rationale for Evaluation and Treatment: Rehabilitation  SUBJECTIVE:   SUBJECTIVE STATEMENT: Pt reports she is not a fan of chair yoga.   Pt accompanied by: self  PERTINENT HISTORY: A-fib, COPD, hypertension, stroke   DIAGNOSTIC FINDINGS: CTA of head/neck from 11/2021   IMPRESSION: 1. Negative for large vessel occlusion, but Positive for complex plaque at the Left ICA origin where it's difficult to exclude ADHERENT THROMBUS within the vessel lumen (series 12, image 87, but alternatively this appearance might be ulcerated plaque and/or an unusual carotid web. Subsequent stenosis numerically estimated at 60%.   2. Positive also for evidence of cervical Right ICA Fibromuscular Dysplasia (FMD).   3. But otherwise mild for age atherosclerosis, and essentially negative intracranial anterior circulation with no other arterial stenosis identified.   4. Stable CT appearance of the brain, No acute intracranial abnormality.   5. Aortic Atherosclerosis (ICD10-I70.0) and Emphysema (ICD10-J43.9). Tortuous aortic arch.   6. 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    PRECAUTIONS: Fall, severe hearing loss with B hearing aides   WEIGHT BEARING RESTRICTIONS: No  PAIN:  Are you  having pain? Yes: NPRS scale: 3-4/10 Pain location: neck Pain description: constant Aggravating factors: lots of movement Relieving factors: rest  FALLS: Has patient fallen in last 6 months? Yes. Number of falls 1  LIVING ENVIRONMENT: Lives with: lives with their family and lives alone Lives in: House/apartment Stairs: Yes: External: 1 in back and 3 on front steps steps; on right going up, on left  going up, and can reach both Has following equipment at home: Single point cane, Walker - 2 wheeled, shower chair, and Grab bars (walk in shower, niece's husband converted the bathtub)  PLOF: Mod I for independence, Leisure included: reading, playing computer games, and was driving  PATIENT GOALS: I'd like to be able to play my computer games better. I have difficulty with jigsaw puzzles but I do fine with Solitaire and card games on the computer.  OBJECTIVE:  Note: Objective measures were completed at Evaluation unless otherwise noted.  HAND DOMINANCE: Right  ADLs: Overall ADLs: Independent, but reports extended time required at times for dressing tasks. Transfers/ambulation related to ADLs: Mod I Eating: Mod I Grooming: Mod I UB Dressing: Independent with extended time LB Dressing: Independent with extended time Toileting: Independent with extended time Bathing: Independent with extended time Tub Shower transfers: Independent with extended time Equipment: See above  IADLs: reports not being able to complete sweeping or scrubbing floor d/t poor balance. Shopping: Mod I Light housekeeping: reports not being able to complete sweeping or scrubbing floor d/t poor balance. Meal Prep: Mod I Community mobility: I Medication management: Mod I Financial management: Niece has taken over finances since most recent stroke. Handwriting: 90% legible and shaky handwriting, pt reports having increased tremors But my handwriting has always been not so neat.   MOBILITY STATUS: difficulty with turns and uses FWW.   POSTURE COMMENTS:  rounded shoulders, forward head, and increased thoracic kyphosis Sitting balance: WFL  ACTIVITY TOLERANCE: Activity tolerance: NT  FUNCTIONAL OUTCOME MEASURES: N/T  UPPER EXTREMITY ROM/MMT:   N/T  HAND FUNCTION: Grip strength: Right: 33 lbs; Left: 29 lbs  COORDINATION: 9 Hole Peg test: Right: 25.69 sec; Left: 30.01 sec  SENSATION: B peripheral  neuropathy noted in PT notes. WFL  EDEMA: none  MUSCLE TONE: RUE: Within functional limits and LUE: Within functional limits  COGNITION: Overall cognitive status: Within functional limits for tasks assessed  VISION: Subjective report: Sometimes I have trouble. But it's hard to describe, but really I have no concerns with my vision.  Baseline vision: Wears glasses for distance only and reading Visual history: cataracts; has had surgery for this  VISION ASSESSMENT: To be further assessed in functional context.   Patient has difficulty with following activities due to following visual impairments: To be further assessed in functional context.   PERCEPTION: WFL  PRAXIS: Not tested  OBSERVATIONS: Pt with tremors in B hands, affecting ability to complete desired leisure tasks including computer puzzles.  TREATMENT :   - Therapeutic activities completed for duration as noted below including:    OT educated pt on table top play of Golf Solitaire for RUE and LUE to address fine motor coordination, gross motor coordination, upper extremity range of motion, scanning and locating of items, bimanual coordination/trunk control, sequencing of unfamiliar motor movements or tasks, and endurance/stamina. Pt required minimal cues for proper play.    - Therapeutic exercises completed for duration as noted below including:  OT initiated RUE and LUE yellow theraputty exercises (grip, pinch and pull (platform position), and rolls) as noted in patient instructions for coordination and strength.  PATIENT EDUCATION: Education details: putty HEP; Manufacturing systems engineer Person educated: Patient Education method: Explanation, Demonstration, and Handouts Education comprehension: verbalized understanding and returned demonstration  HOME EXERCISE PROGRAM: 10/24/2023: putty HEP; golf  solitaire  GOALS: Goals reviewed with patient? Yes  SHORT TERM GOALS: Target date: 11/14/23  Pt will be independent with FM coordination HEP Baseline: To be educated  Goal status: IN PROGRESS  2.  Pt will demonstrate improved FM coordination by a score of no more 20.7 seconds on R hand on 9 HPT Baseline: 25.69 Goal status: INITIAL  3.  Pt will demonstrate improved FM coordination in L hand by a score of no more than 23 seconds on 9HPT Baseline: 30.01 Goal status: INITIAL  LONG TERM GOALS: Target date: 11/26/23  Pt will be independent with educated tremor mgmt Baseline: 10/22/23 Handout provided and educated  Goal status: INITIAL  2.  Pt will demonstrate good understanding of compensatory sweeping technique Baseline: 10/22/23 educated, continue to follow up   Goal status: INITIAL  ASSESSMENT:  CLINICAL IMPRESSION: Patient demonstrates good understanding of putty HEP and functional activity as needed to progress towards goals. Will review putty HEP at next session to assure proper execution and reduce potential for pain flare given B arthritic changes.   PERFORMANCE DEFICITS: in functional skills including coordination, Fine motor control, and tremors, and psychosocial skills including routines and behaviors.   IMPAIRMENTS: are limiting patient from IADLs and leisure.   CO-MORBIDITIES: may have co-morbidities  that affects occupational performance. Patient will benefit from skilled OT to address above impairments and improve overall function.  REHAB POTENTIAL: Fair noted not always adhering to HEPs with PT  PLAN:  OT FREQUENCY: 1x/week  OT DURATION: 6 weeks  PLANNED INTERVENTIONS: 97168 OT Re-evaluation, 97535 self care/ADL training, 02889 therapeutic exercise, 97530 therapeutic activity, 97112 neuromuscular re-education, 97140 manual therapy, 97760 Orthotic Initial, [id number] Orthotic/Prosthetic subsequent, psychosocial skills training, energy conservation, coping strategies  training, patient/family education, and DME and/or AE instructions  RECOMMENDED OTHER SERVICES: none at this time  CONSULTED AND AGREED WITH PLAN OF CARE: Patient  PLAN FOR NEXT SESSION:  Review FM Review Putty Review golf solitaire as needed (see Atha Muradyan if pt does not have cards at home) F/u compensatory sweeping education  Jocelyn CHRISTELLA [place], OT 10/24/2023, 4:45 PM

## 2023-10-24 NOTE — Therapy (Signed)
 OUTPATIENT PHYSICAL THERAPY NEURO TREATMENT   Patient Name: [name] MRN: [id number] DOB:1934/05/07, 89 y.o., female Today's Date: 10/24/2023   PCP: Auston Opal, DO REFERRING PROVIDER: Auston Opal, DO  END OF SESSION:  PT End of Session - 10/24/23 1453     Visit Number 8    Number of Visits 13    Date for Recertification  11/08/23    Authorization Type [employer]    PT Start Time 1450    PT Stop Time 1539    PT Time Calculation (min) 49 min    Equipment Utilized During Treatment Gait belt    Activity Tolerance Patient tolerated treatment well    Behavior During Therapy WFL for tasks assessed/performed              Past Medical History:  Diagnosis Date   Arthritis    Atrial fibrillation (HCC)    Carotid artery occlusion    Colitis    COPD (chronic obstructive pulmonary disease) (HCC)    Diverticulitis    Hypertension    Keratosis, seborrheic    Lumbar spondylosis    w/impingement L3-S1   Past Surgical History:  Procedure Laterality Date   ABDOMINAL HYSTERECTOMY     BREAST EXCISIONAL BIOPSY Left    BREAST EXCISIONAL BIOPSY Left    BREAST EXCISIONAL BIOPSY Right    BREAST SURGERY     cyst removal   CAROTID ENDARTERECTOMY Left 2010   CHOLECYSTECTOMY     EYE SURGERY Bilateral 06/2014   cataracts   JOINT REPLACEMENT     RIGHT KNEE   REPLACEMENT TOTAL KNEE Right    Patient Active Problem List   Diagnosis Date Noted   Cerebellar stroke (HCC) 12/20/2021   Vertigo 12/14/2021   CVA (cerebral vascular accident) (HCC) 12/14/2021   Hypokalemia 12/14/2021   Leukocytosis 12/14/2021   Essential hypertension 12/14/2021   Anxiety 12/14/2021   Hyperlipidemia 12/14/2021   Carotid artery disease 12/14/2021   Osteoarthritis cervical spine 09/21/2020   Compression fracture of T12 vertebra (HCC) 04/16/2018   Paroxysmal atrial fibrillation (HCC) 03/03/2018   Occlusion and stenosis of carotid artery without mention of cerebral infarction  02/05/2012    ONSET DATE: 08/13/2023 (referral)  REFERRING DIAG: R26.89 (ICD-10-CM) - Other abnormalities of gait and mobility  THERAPY DIAG:  Other lack of coordination  Muscle weakness (generalized)  Other symptoms and signs involving the musculoskeletal system  History of falling  Unsteadiness on feet  Abnormality of gait and mobility  Rationale for Evaluation and Treatment: Rehabilitation  SUBJECTIVE:  SUBJECTIVE STATEMENT: Pt presents using RW.  Pt reports 3-4 close calls for falls (but no actual falls) since last visit (1x stumble over shoe; other times she was moving too fast and multi-tasking).  Feeling good in general today.  Thinks she might be able to go back to church on Sunday (and is looking forward to it).  Pt report doing 2-3 ex's a day from HEP.  Pt reports B feet (not legs) gradually swell as day goes on but no acute breathing issues/changes.  Pt reports plan to get a new RW (doesn't like current RW).  Pt accompanied by: Self   PERTINENT HISTORY: A-fib, COPD, hypertension, stroke  PAIN:  Are you having pain? Yes: NPRS scale: 4/10 Pain location: Neck--when turn to L Pain description: achy Pt reports frequent neck pain 2/2 arthritis   PRECAUTIONS: Fall; pt needs to visualize mouth to fully comprehend what she is hearing - Severe hearing loss w/ bilateral hearing aides  RED FLAGS: None   WEIGHT BEARING RESTRICTIONS: No  FALLS: Has patient fallen in last 6 months? Yes. Number of falls 1  LIVING ENVIRONMENT: Lives with: lives alone Lives in: House/apartment Stairs: Yes: External: 1 in back and 3 in front steps; on right going up, on left going up, and can reach both Has following equipment at home: Single point cane, Walker - 2 wheeled, shower chair, and Grab  bars  PLOF: Requires assistive device for independence, Leisure: reading, playing games on the computer, and is driving  PATIENT GOALS: my balance   OBJECTIVE:  Note: Objective measures were completed at Evaluation unless otherwise noted.  DIAGNOSTIC FINDINGS: CTA of head/neck from 11/2021  IMPRESSION: 1. Negative for large vessel occlusion, but Positive for complex plaque at the Left ICA origin where it's difficult to exclude ADHERENT THROMBUS within the vessel lumen (series 12, image 87, but alternatively this appearance might be ulcerated plaque and/or an unusual carotid web. Subsequent stenosis numerically estimated at 60%.   2. Positive also for evidence of cervical Right ICA Fibromuscular Dysplasia (FMD).   3. But otherwise mild for age atherosclerosis, and essentially negative intracranial anterior circulation with no other arterial stenosis identified.   4. Stable CT appearance of the brain, No acute intracranial abnormality.   5. Aortic Atherosclerosis (ICD10-I70.0) and Emphysema (ICD10-J43.9). Tortuous aortic arch.   6. 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  COGNITION: Overall cognitive status: Within functional limits for tasks assessed   SENSATION: Pt reports bilateral peripheral neuropathy    EDEMA: Pt reports frequent swelling     POSTURE: rounded shoulders, forward head, and increased thoracic kyphosis  LOWER EXTREMITY ROM:     Active  Right Eval Left Eval  Hip flexion    Hip extension    Hip abduction    Hip adduction    Hip internal rotation    Hip external rotation    Knee flexion    Knee extension    Ankle dorsiflexion    Ankle plantarflexion    Ankle inversion    Ankle eversion     (Blank rows = not tested)  LOWER EXTREMITY MMT:  Tested in seated position   MMT Right Eval Left Eval  Hip flexion 4- 4  Hip extension    Hip abduction 4+ 4  Hip adduction 4 4-  Hip internal rotation     Hip external rotation    Knee flexion 5 4+  Knee extension 5 4+  Ankle dorsiflexion 5 5  Ankle plantarflexion    Ankle  inversion    Ankle eversion    (Blank rows = not tested)  BED MOBILITY:  Not tested Pt reports difficulty due to high height of bed. Wants to get rid of her headboard but is unable to do this on her own   TRANSFERS: Sit to stand: SBA  Assistive device utilized: Environmental consultant - 2 wheeled     Stand to sit: SBA  Assistive device utilized: Environmental consultant - 2 wheeled     Bilateral genu valgus, weight shift to R side, heavy BUE reliance   RAMP:  Not tested  CURB:  Not tested  STAIRS: Not tested GAIT: Gait pattern: step through pattern, decreased step length- Right, decreased stride length, decreased hip/knee flexion- Right, decreased ankle dorsiflexion- Right, lateral hip instability, trunk flexed, and poor foot clearance- Right Distance walked: Various clinic distances  Assistive device utilized: Walker - 2 wheeled Level of assistance: SBA Comments: Min cues to stay close to RW to facilitate upright posture and to stay inside RW when turning. Min cues to reduce scuffing of R foot   FUNCTIONAL TESTS:     VITALS  Vitals:   10/24/23 1509  BP: (!) 142/60  Pulse: (!) 56                                                                                                                                    TREATMENT:  10/24/23  Self care: -BP taken in sitting (after 1st trial of Blaze Pods in // bars); systolic BP elevated but within limits for session.  Therapeutic Activity -Sit to stands: x2 from chair with armrests (1st trial pt pushing up with B UE's on walker; 2nd trial pt pushing off of chair armrests without cueing); x4 separate trials standing from chair in // bars no UE support (increased difficulty/effort noted 3rd and 4th trials) -Blaze Pods in // bars (standing); 4 pods; to increase SLS strength and balance; dual task activity; x1 minute each with 2-3 minute  sitting rest break between -1st trial: 51 hits switching between LE's; B UE support  -2nd trial: 32 hits L LE with R UE support;  -3rd trial: 31 hits R LE with L UE support -RPE 5/10 post activity  -Ladder x2 trials with RW focusing on longer step lengths/foot clearance to improve gait speed and quality; post activity HR 64 bpm -SciFit: Level 2 for 5 1/2 minutes with B UE and LE's to increase reciprocal movement and stepping amplitude (cool-down last 2 minutes)   10/22/23 Treatment  Ther Act/Physical Performance/Self-care  Assessed vitals in LUE (see above) and systolic BP elevated but within limits for session.  SciFit multi-peaks level 8.5 for 8 minutes using BUE/BLEs for neural priming for reciprocal movement, dynamic cardiovascular warmup and increased amplitude of stepping. RPE of 4/10 following activity    At ballet bar, alt fwd adv/retreat over 4 beam w/LUE support on rail for improved step clearance and single leg stability. Pt  performed x8 reps per side, frequently knocking beam over w/LLE. On final rep, pt lost balance to L side due to fatigue, requiring mod A to prevent a fall. Guided pt back to chair and pt reports being fatigued today. Also noted pt wearing slip on shoes w/no backing, so recommended full coverage shoes for therapy. Pt verbalized understanding.   PATIENT EDUCATION: Education details: Continue HEP; Fall prevention; options (local and online) to obtain new RW. Person educated: Patient Education method: Explanation and Verbal cues Education comprehension: verbalized understanding, verbal cues required, and needs further education  HOME EXERCISE PROGRAM: Continue HEP To be reviewed from previous POC: [id number]   Chair yoga (replace one day of HEP w/ this): -Diaphragmatic breathing x10 -cat/cow x10 -trunk circles x10 CW > x10 CCW -Sun salutation x10 w/ paced breathing -Sun salutation w/ trunk rotation x10 each side -Assisted neck stretches 5x5 second hold  each side -High altar side leans 5x3 seconds each side -Ankle to knee stretch no overpressure x45 seconds each side -Goddess w/ a twist x3 each side no hold -Forward fold x5 w/ paced breathing  GOALS: Goals reviewed with patient? Yes  SHORT TERM GOALS: Target date: 10/18/2023   Pt will be independent with initial HEP for improved strength, balance, transfers and gait.  Baseline: to be reviewed/updated from previous POC  Goal status: MET  2.  Pt will improve 5 x STS to less than or equal to 16 seconds w/improved anterior weight shift to demonstrate improved functional strength and transfer efficiency.   Baseline: 17.6s w/BUE support and poor body mechanics; 22.65s w/no UE support (9/30) Goal status: NOT MET  3.  Pt will improve gait velocity to at least 0.72 m/s w/LRAD for improved gait efficiency and independence   Baseline: 0.65 m/s w/RW; 0.48 m/s w/RW (9/30) Goal status: NOT MET   4.  Pt will improve normal TUG to less than or equal to 23 seconds w/LRAD for improved functional mobility and decreased fall risk.  Baseline: 26.59s w/RW; 31.22s w/RW (9/30) Goal status: NOT MET   LONG TERM GOALS: Target date: 11/01/2023   Pt will be independent with final HEP for improved strength, balance, transfers and gait.  Baseline:  Goal status: INITIAL  2.  Pt will improve 5 x STS to less than or equal to 18 seconds w/proper body mechanics and no UE support to demonstrate improved functional strength and transfer efficiency.   Baseline: 17.6s w/improper body mechanics; 22.65s w/no UE support (9/30) Goal status: REVISED  3.  Pt will improve gait velocity to at least 0.43m/s w/LRAD for improved gait efficiency and reduced fall risk   Baseline: 0.65 m/s wRW; 0.48 m/s w/RW (9/30) Goal status: REVISED  4.  Pt will improve normal TUG to less than or equal to 23 seconds w/LRAD for improved functional mobility and decreased fall risk.  Baseline: 26.59s w/RW; 31.22s w/RW (9/30) Goal  status: REVISED  5.  Pt will perform floor transfer w/min A for improved functional strength and fall recovery at home  Baseline:  Goal status: INITIAL   ASSESSMENT:  CLINICAL IMPRESSION: PT session focused on single leg stability and increasing step length/clearance/amplitude.  Increased effort/time noted to stand from chair during session (with repetition) without UE support (pt reports she has been standing without UE support at home and was not having any difficulties).  Verbal cues continue to be required for step length/foot clearance during ambulation/activities.  Encouraged pt to continue HEP (pt reports doing 2-3 exercises a day currently).  Pt  also educated on fall prevention and safety d/t reports of 3-4 close calls for falls recently (but no actual falls).  Continue POC.    OBJECTIVE IMPAIRMENTS: Abnormal gait, cardiopulmonary status limiting activity, decreased activity tolerance, decreased balance, decreased cognition, decreased endurance, decreased knowledge of condition, decreased knowledge of use of DME, decreased mobility, difficulty walking, decreased strength, decreased safety awareness, impaired perceived functional ability, impaired sensation, improper body mechanics, and pain  ACTIVITY LIMITATIONS: carrying, lifting, bending, standing, squatting, stairs, transfers, bed mobility, locomotion level, and caring for others  PARTICIPATION LIMITATIONS: meal prep, cleaning, laundry, driving, shopping, community activity, and yard work  PERSONAL FACTORS: Fitness, Transportation, and 1-2 comorbidities: Hx of CVA are also affecting patient's functional outcome.   REHAB POTENTIAL: Good  CLINICAL DECISION MAKING: Evolving/moderate complexity  EVALUATION COMPLEXITY: Moderate  PLAN:  PT FREQUENCY: 2x/week  PT DURATION: 6 weeks  PLANNED INTERVENTIONS: 02835- PT Re-evaluation, 97750- Physical Performance Testing, 97110-Therapeutic exercises, 97530- Therapeutic activity, 97112-  Neuromuscular re-education, 97535- Self Care, 02859- Manual therapy, [phone]- Gait training, [phone]- Aquatic Therapy, [phone]- Electrical stimulation (manual), [phone] (1-2 muscles), [phone] (3+ muscles)- Dry Needling, Patient/Family education, Balance training, Stair training, Joint mobilization, Spinal mobilization, Vestibular training, and DME instructions  PLAN FOR NEXT SESSION: Review HEP from previous POC and revise as needed (pt will need to work on more functional strength tasks- sit to stands, standing marches, side stepping, etc.). Work on turns!! Upright posture, endurance - seated aerobics, reaching to L side, high amplitude movements, increased step clearance w/RLE     Damien Caulk, PT 10/24/2023, 5:02 PM

## 2023-10-29 ENCOUNTER — Ambulatory Visit: Payer: Medicare (Managed Care) | Admitting: Physical Therapy

## 2023-10-29 ENCOUNTER — Ambulatory Visit: Payer: Medicare (Managed Care)

## 2023-10-29 VITALS — BP 153/88 | HR 56

## 2023-10-29 DIAGNOSIS — R278 Other lack of coordination: Secondary | ICD-10-CM

## 2023-10-29 DIAGNOSIS — R29898 Other symptoms and signs involving the musculoskeletal system: Secondary | ICD-10-CM

## 2023-10-29 DIAGNOSIS — M6281 Muscle weakness (generalized): Secondary | ICD-10-CM

## 2023-10-29 DIAGNOSIS — R2681 Unsteadiness on feet: Secondary | ICD-10-CM

## 2023-10-29 NOTE — Therapy (Signed)
 OUTPATIENT PHYSICAL THERAPY NEURO TREATMENT   Patient Name: [name] MRN: [id number] DOB:[birth year], 89 y.o., female Today's Date: 10/29/2023   PCP: Auston Opal, DO REFERRING PROVIDER: Auston Opal, DO  END OF SESSION:  PT End of Session - 10/29/23 1446     Visit Number 9    Number of Visits 13    Date for Recertification  11/08/23    Authorization Type [employer]age    PT Start Time 1445    PT Stop Time 1528    PT Time Calculation (min) 43 min    Equipment Utilized During Treatment Gait belt    Activity Tolerance Patient tolerated treatment well    Behavior During Therapy WFL for tasks assessed/performed              Past Medical History:  Diagnosis Date   Arthritis    Atrial fibrillation (HCC)    Carotid artery occlusion    Colitis    COPD (chronic obstructive pulmonary disease) (HCC)    Diverticulitis    Hypertension    Keratosis, seborrheic    Lumbar spondylosis    w/impingement L3-S1   Past Surgical History:  Procedure Laterality Date   ABDOMINAL HYSTERECTOMY     BREAST EXCISIONAL BIOPSY Left    BREAST EXCISIONAL BIOPSY Left    BREAST EXCISIONAL BIOPSY Right    BREAST SURGERY     cyst removal   CAROTID ENDARTERECTOMY Left 2010   CHOLECYSTECTOMY     EYE SURGERY Bilateral 06/2014   cataracts   JOINT REPLACEMENT     RIGHT KNEE   REPLACEMENT TOTAL KNEE Right    Patient Active Problem List   Diagnosis Date Noted   Cerebellar stroke (HCC) 12/20/2021   Vertigo 12/14/2021   CVA (cerebral vascular accident) (HCC) 12/14/2021   Hypokalemia 12/14/2021   Leukocytosis 12/14/2021   Essential hypertension 12/14/2021   Anxiety 12/14/2021   Hyperlipidemia 12/14/2021   Carotid artery disease 12/14/2021   Osteoarthritis cervical spine 09/21/2020   Compression fracture of T12 vertebra (HCC) 04/16/2018   Paroxysmal atrial fibrillation (HCC) 03/03/2018   Occlusion and stenosis of carotid artery without mention of cerebral infarction  02/05/2012    ONSET DATE: 08/13/2023 (referral)  REFERRING DIAG: R26.89 (ICD-10-CM) - Other abnormalities of gait and mobility  THERAPY DIAG:  Other lack of coordination  Muscle weakness (generalized)  Unsteadiness on feet  Rationale for Evaluation and Treatment: Rehabilitation  SUBJECTIVE:  SUBJECTIVE STATEMENT: Pt presents using RW. States she feels like her balance has gotten worse over the past few days. Unsure why, denies any other changes. Denies falls or almost falls since last visit.   Pt accompanied by: Self   PERTINENT HISTORY: A-fib, COPD, hypertension, stroke  PAIN:  Are you having pain? Yes: NPRS scale: 1/10 Pain location: Neck--when turn to L Pain description: achy Pt reports frequent neck pain 2/2 arthritis   PRECAUTIONS: Fall; pt needs to visualize mouth to fully comprehend what she is hearing - Severe hearing loss w/ bilateral hearing aides  RED FLAGS: None   WEIGHT BEARING RESTRICTIONS: No  FALLS: Has patient fallen in last 6 months? Yes. Number of falls 1  LIVING ENVIRONMENT: Lives with: lives alone Lives in: House/apartment Stairs: Yes: External: 1 in back and 3 in front steps; on right going up, on left going up, and can reach both Has following equipment at home: Single point cane, Walker - 2 wheeled, shower chair, and Grab bars  PLOF: Requires assistive device for independence, Leisure: reading, playing games on the computer, and is driving  PATIENT GOALS: my balance   OBJECTIVE:  Note: Objective measures were completed at Evaluation unless otherwise noted.  DIAGNOSTIC FINDINGS: CTA of head/neck from 11/2021  IMPRESSION: 1. Negative for large vessel occlusion, but Positive for complex plaque at the Left ICA origin where it's difficult to  exclude ADHERENT THROMBUS within the vessel lumen (series 12, image 87, but alternatively this appearance might be ulcerated plaque and/or an unusual carotid web. Subsequent stenosis numerically estimated at 60%.   2. Positive also for evidence of cervical Right ICA Fibromuscular Dysplasia (FMD).   3. But otherwise mild for age atherosclerosis, and essentially negative intracranial anterior circulation with no other arterial stenosis identified.   4. Stable CT appearance of the brain, No acute intracranial abnormality.   5. Aortic Atherosclerosis (ICD10-I70.0) and Emphysema (ICD10-J43.9). Tortuous aortic arch.   6. 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  COGNITION: Overall cognitive status: Within functional limits for tasks assessed   SENSATION: Pt reports bilateral peripheral neuropathy    EDEMA: Pt reports frequent swelling     POSTURE: rounded shoulders, forward head, and increased thoracic kyphosis  LOWER EXTREMITY ROM:     Active  Right Eval Left Eval  Hip flexion    Hip extension    Hip abduction    Hip adduction    Hip internal rotation    Hip external rotation    Knee flexion    Knee extension    Ankle dorsiflexion    Ankle plantarflexion    Ankle inversion    Ankle eversion     (Blank rows = not tested)  LOWER EXTREMITY MMT:  Tested in seated position   MMT Right Eval Left Eval  Hip flexion 4- 4  Hip extension    Hip abduction 4+ 4  Hip adduction 4 4-  Hip internal rotation    Hip external rotation    Knee flexion 5 4+  Knee extension 5 4+  Ankle dorsiflexion 5 5  Ankle plantarflexion    Ankle inversion    Ankle eversion    (Blank rows = not tested)  BED MOBILITY:  Not tested Pt reports difficulty due to high height of bed. Wants to get rid of her headboard but is unable to do this on her own   TRANSFERS: Sit to stand: SBA  Assistive device utilized: Environmental consultant - 2 wheeled     Stand to  sit: SBA   Assistive device utilized: Environmental consultant - 2 wheeled     Bilateral genu valgus, weight shift to R side, heavy BUE reliance   RAMP:  Not tested  CURB:  Not tested  STAIRS: Not tested GAIT: Gait pattern: step through pattern, decreased step length- Right, decreased stride length, decreased hip/knee flexion- Right, decreased ankle dorsiflexion- Right, lateral hip instability, trunk flexed, and poor foot clearance- Right Distance walked: Various clinic distances  Assistive device utilized: Walker - 2 wheeled Level of assistance: SBA Comments: Min cues to stay close to RW to facilitate upright posture and to stay inside RW when turning. Min cues to reduce scuffing of R foot   FUNCTIONAL TESTS:     VITALS  Vitals:   10/29/23 1449  BP: (!) 153/88  Pulse: (!) 56                                                                                                                                TREATMENT:    Self-care/Ther Act  Assessed vitals in LUE (see above) and systolic BP elevated but within limits for session   Provided therapeutic listening as pt discussed having to put her dog down recently and how this has impacted her mental health. Pt tearful but reports she is still eating and sleeping well. Went to church yesterday for the first time in a few years and enjoyed it. Wants to make this a regular routine.  Pt reports she cannot explain how her balance is changed, but does not feel as stable walking now. Pt inquiring about a vestibular input to her balance, so educated pt on the 3 balance systems and will perform MCTSIB today to assess for this.  SciFit multi-peaks level 6.5 for 8 minutes using BUE/BLEs for neural priming for reciprocal movement, dynamic cardiovascular warmup and increased amplitude of stepping. RPE of 8/10 following activity   MCTSIB: Condition 1: Avg of 3 trials: 30 sec, Condition 2: Avg of 3 trials: 30 sec, Condition 3: Avg of 3 trials: 19s (18, 9, 30 sec), Condition 4:  Avg of 3 trials: 5.6 (6,3, 8 sec) and Total Score: 84.6/120   In // bars for improved postural control, ankle strategy, step clearance and single leg stability:  On rockerboard in A/P direction, alt retro step w/single UE support using mirror for visual biofeedback on body position. Pt required min-mod A for stability due to decreased step length/clearance w/RLE and posterior LOB.  Max verbal cues for wide BOS and increased step clearance w/RLE to increase stability, but poor retention noted.    PATIENT EDUCATION: Education details: Continue HEP; plan to check goals next session and may add visits if making progress Person educated: Patient Education method: Explanation and Verbal cues Education comprehension: verbalized understanding, verbal cues required, and needs further education  HOME EXERCISE PROGRAM: Continue HEP To be reviewed from previous POC: [id number]   Chair yoga (replace one day of HEP w/ this): -  Diaphragmatic breathing x10 -cat/cow x10 -trunk circles x10 CW > x10 CCW -Sun salutation x10 w/ paced breathing -Sun salutation w/ trunk rotation x10 each side -Assisted neck stretches 5x5 second hold each side -High altar side leans 5x3 seconds each side -Ankle to knee stretch no overpressure x45 seconds each side -Goddess w/ a twist x3 each side no hold -Forward fold x5 w/ paced breathing  GOALS: Goals reviewed with patient? Yes  SHORT TERM GOALS: Target date: 10/18/2023   Pt will be independent with initial HEP for improved strength, balance, transfers and gait.  Baseline: to be reviewed/updated from previous POC  Goal status: MET  2.  Pt will improve 5 x STS to less than or equal to 16 seconds w/improved anterior weight shift to demonstrate improved functional strength and transfer efficiency.   Baseline: 17.6s w/BUE support and poor body mechanics; 22.65s w/no UE support (9/30) Goal status: NOT MET  3.  Pt will improve gait velocity to at least 0.72 m/s w/LRAD  for improved gait efficiency and independence   Baseline: 0.65 m/s w/RW; 0.48 m/s w/RW (9/30) Goal status: NOT MET   4.  Pt will improve normal TUG to less than or equal to 23 seconds w/LRAD for improved functional mobility and decreased fall risk.  Baseline: 26.59s w/RW; 31.22s w/RW (9/30) Goal status: NOT MET   LONG TERM GOALS: Target date: 11/01/2023   Pt will be independent with final HEP for improved strength, balance, transfers and gait.  Baseline:  Goal status: INITIAL  2.  Pt will improve 5 x STS to less than or equal to 18 seconds w/proper body mechanics and no UE support to demonstrate improved functional strength and transfer efficiency.   Baseline: 17.6s w/improper body mechanics; 22.65s w/no UE support (9/30) Goal status: REVISED  3.  Pt will improve gait velocity to at least 0.49m/s w/LRAD for improved gait efficiency and reduced fall risk   Baseline: 0.65 m/s wRW; 0.48 m/s w/RW (9/30) Goal status: REVISED  4.  Pt will improve normal TUG to less than or equal to 23 seconds w/LRAD for improved functional mobility and decreased fall risk.  Baseline: 26.59s w/RW; 31.22s w/RW (9/30) Goal status: REVISED  5.  Pt will perform floor transfer w/min A for improved functional strength and fall recovery at home  Baseline:  Goal status: INITIAL   ASSESSMENT:  CLINICAL IMPRESSION: Pt presented to session w/RW and complaints of worsening balance over the past few days. Pt states she did go to church on Sunday, which she enjoyed. However, pt emotional regarding the passing of her dog 3 weeks ago and states this has greatly affected her mood. Pt requesting her vestibular system to be screened, so performed MCTSIB and pt scored an 84.6/120, with greatest difficulty noted on conditions 3 and 4. Pt demonstrates absent ankle and stepping strategy when on unlevel surfaces, likely contributing more to her instability than a vestibular hypofunction. Plan to assess goals next session  and determine recert vs DC. Continue POC.    OBJECTIVE IMPAIRMENTS: Abnormal gait, cardiopulmonary status limiting activity, decreased activity tolerance, decreased balance, decreased cognition, decreased endurance, decreased knowledge of condition, decreased knowledge of use of DME, decreased mobility, difficulty walking, decreased strength, decreased safety awareness, impaired perceived functional ability, impaired sensation, improper body mechanics, and pain  ACTIVITY LIMITATIONS: carrying, lifting, bending, standing, squatting, stairs, transfers, bed mobility, locomotion level, and caring for others  PARTICIPATION LIMITATIONS: meal prep, cleaning, laundry, driving, shopping, community activity, and yard work  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   REHAB POTENTIAL: Good  CLINICAL DECISION MAKING: Evolving/moderate complexity  EVALUATION COMPLEXITY: Moderate  PLAN:  PT FREQUENCY: 2x/week  PT DURATION: 6 weeks  PLANNED INTERVENTIONS: 97164- PT Re-evaluation, 97750- Physical Performance Testing, 97110-Therapeutic exercises, 97530- Therapeutic activity, [id number]- Neuromuscular re-education, 97535- Self Care, 02859- Manual therapy, [id number]- Gait training, [phone]- Aquatic Therapy, [phone]- Electrical stimulation (manual), [phone] (1-2 muscles), 20561 (3+ muscles)- Dry Needling, Patient/Family education, Balance training, Stair training, Joint mobilization, Spinal mobilization, Vestibular training, and DME instructions  PLAN FOR NEXT SESSION: 10th visit PN. Check goals and either DC or recert for 1x/week for 4 weeks depending on progress. Can write MCTSIB goal.    Review HEP from previous POC and revise as needed (pt will need to work on more functional strength tasks- sit to stands, standing marches, side stepping, etc.). Work on turns!! Upright posture, endurance - seated aerobics, reaching to L side, high  amplitude movements, increased step clearance w/RLE     Irais Mottram E Mychael Smock, PT, DPT 10/29/2023, 3:32 PM

## 2023-10-29 NOTE — Therapy (Signed)
 OUTPATIENT OCCUPATIONAL THERAPY NEURO TREATMENT  Patient Name: [name] MRN: [id number] DOB:02-08-1934, 89 y.o., female Today's Date: 10/29/2023  PCP: Auston Opal, DO REFERRING PROVIDER: Auston Opal, DO  END OF SESSION:   Past Medical History:  Diagnosis Date   Arthritis    Atrial fibrillation (HCC)    Carotid artery occlusion    Colitis    COPD (chronic obstructive pulmonary disease) (HCC)    Diverticulitis    Hypertension    Keratosis, seborrheic    Lumbar spondylosis    w/impingement L3-S1   Past Surgical History:  Procedure Laterality Date   ABDOMINAL HYSTERECTOMY     BREAST EXCISIONAL BIOPSY Left    BREAST EXCISIONAL BIOPSY Left    BREAST EXCISIONAL BIOPSY Right    BREAST SURGERY     cyst removal   CAROTID ENDARTERECTOMY Left 2010   CHOLECYSTECTOMY     EYE SURGERY Bilateral 06/2014   cataracts   JOINT REPLACEMENT     RIGHT KNEE   REPLACEMENT TOTAL KNEE Right    Patient Active Problem List   Diagnosis Date Noted   Cerebellar stroke (HCC) 12/20/2021   Vertigo 12/14/2021   CVA (cerebral vascular accident) (HCC) 12/14/2021   Hypokalemia 12/14/2021   Leukocytosis 12/14/2021   Essential hypertension 12/14/2021   Anxiety 12/14/2021   Hyperlipidemia 12/14/2021   Carotid artery disease 12/14/2021   Osteoarthritis cervical spine 09/21/2020   Compression fracture of T12 vertebra (HCC) 04/16/2018   Paroxysmal atrial fibrillation (HCC) 03/03/2018   Occlusion and stenosis of carotid artery without mention of cerebral infarction 02/05/2012   ONSET DATE: 10/04/2023 referral date   REFERRING DIAG:  I69.313 (ICD-10-CM) - Psychomotor deficit following cerebral infarction THERAPY DIAG:  No diagnosis found.  Rationale for Evaluation and Treatment: Rehabilitation  SUBJECTIVE:   SUBJECTIVE STATEMENT: Pt reports she enjoyed Pensions consultant and is going to obtain a pack of cards. Pt reported not feeling too great today, denied sickness saying It's just  one of those days. Reported having to put dog down recently. Asked pt if needed any community resources on counseling, pt denied but said if I need to I'll you know, thank you.   Pt accompanied by: self  PERTINENT HISTORY: A-fib, COPD, hypertension, stroke   DIAGNOSTIC FINDINGS: CTA of head/neck from 11/2021   IMPRESSION: 1. Negative for large vessel occlusion, but Positive for complex plaque at the Left ICA origin where it's difficult to exclude ADHERENT THROMBUS within the vessel lumen (series 12, image 87, but alternatively this appearance might be ulcerated plaque and/or an unusual carotid web. Subsequent stenosis numerically estimated at 60%.   2. Positive also for evidence of cervical Right ICA Fibromuscular Dysplasia (FMD).   3. But otherwise mild for age atherosclerosis, and essentially negative intracranial anterior circulation with no other arterial stenosis identified.   4. Stable CT appearance of the brain, No acute intracranial abnormality.   5. Aortic Atherosclerosis (ICD10-I70.0) and Emphysema (ICD10-J43.9). Tortuous aortic arch.   6. 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    PRECAUTIONS: Fall, severe hearing loss with B hearing aides   WEIGHT BEARING RESTRICTIONS: No  PAIN:  Are you having pain? No   FALLS: Has patient fallen in last 6 months? Yes. Number of falls 1  LIVING ENVIRONMENT: Lives with: lives with their family and lives alone Lives in: House/apartment Stairs: Yes: External: 1 in back and 3 on front steps steps; on right going up, on left going up, and can reach both Has following equipment at home:  Single point cane, Walker - 2 wheeled, shower chair, and Grab bars (walk in shower, niece's husband converted the bathtub)  PLOF: Mod I for independence, Leisure included: reading, playing computer games, and was driving  PATIENT GOALS: I'd like to be able to play my computer games better. I have  difficulty with jigsaw puzzles but I do fine with Solitaire and card games on the computer.  OBJECTIVE:  Note: Objective measures were completed at Evaluation unless otherwise noted.  HAND DOMINANCE: Right  ADLs: Overall ADLs: Independent, but reports extended time required at times for dressing tasks. Transfers/ambulation related to ADLs: Mod I Eating: Mod I Grooming: Mod I UB Dressing: Independent with extended time LB Dressing: Independent with extended time Toileting: Independent with extended time Bathing: Independent with extended time Tub Shower transfers: Independent with extended time Equipment: See above  IADLs: reports not being able to complete sweeping or scrubbing floor d/t poor balance. Shopping: Mod I Light housekeeping: reports not being able to complete sweeping or scrubbing floor d/t poor balance. Meal Prep: Mod I Community mobility: I Medication management: Mod I Financial management: Niece has taken over finances since most recent stroke. Handwriting: 90% legible and shaky handwriting, pt reports having increased tremors But my handwriting has always been not so neat.   MOBILITY STATUS: difficulty with turns and uses FWW.   POSTURE COMMENTS:  rounded shoulders, forward head, and increased thoracic kyphosis Sitting balance: WFL  ACTIVITY TOLERANCE: Activity tolerance: NT  FUNCTIONAL OUTCOME MEASURES: N/T  UPPER EXTREMITY ROM/MMT:   N/T  HAND FUNCTION: Grip strength: Right: 33 lbs; Left: 29 lbs  COORDINATION: 9 Hole Peg test: Right: 25.69 sec; Left: 30.01 sec  SENSATION: B peripheral neuropathy noted in PT notes. WFL  EDEMA: none  MUSCLE TONE: RUE: Within functional limits and LUE: Within functional limits  COGNITION: Overall cognitive status: Within functional limits for tasks assessed  VISION: Subjective report: Sometimes I have trouble. But it's hard to describe, but really I have no concerns with my vision.  Baseline vision:  Wears glasses for distance only and reading Visual history: cataracts; has had surgery for this  VISION ASSESSMENT: To be further assessed in functional context.   Patient has difficulty with following activities due to following visual impairments: To be further assessed in functional context.   PERCEPTION: WFL  PRAXIS: Not tested  OBSERVATIONS: Pt with tremors in B hands, affecting ability to complete desired leisure tasks including computer puzzles.                                                                                                                            TREATMENT :   Reviewed compensatory sweeping activity, pt stated I got a new vacuum cleaner that works great on bare floors so I don't really need to sweep all that much, but verbalized understanding of compensatory sweeping. Pt reported enjoying golf solitaire and wanted to obtain cards to complete on her own, educated in where to find  and purchase playing cards.    OT educated pt on activity with yellow putty (hiding 5 or RUE and LUE to address fine motor coordination, gross motor coordination, upper extremity range of motion, scanning and locating of items, bimanual coordination/trunk control, sequencing of unfamiliar motor movements or tasks, and endurance/stamina. Pt demonstrated good understanding and stated that was kind of fun. Continued there activities completing tip to tip pinch on B hands inserting small pegs into peg board and recreating pattern. Also utilized resistive clamp at level 1 resistance with silver coil, able to grade up to level 2 resistance, utilizing L hand to squeeze clamp and grasp large pegs on peg board and remove, placing in designated stationary target.  OT initiated RUE and LUE yellow theraputty exercises (grip, pinch and pull (platform position), and rolls) as noted in patient instructions for coordination and strength.  PATIENT EDUCATION: Education details: putty activity (hiding and  locating items in putty);  Person educated: Patient Education method: Explanation, Demonstration, and Handouts Education comprehension: verbalized understanding and returned demonstration  HOME EXERCISE PROGRAM: 10/24/2023: putty HEP; golf solitaire 10/29/23: putty activity (hiding items in putty and pinching/pulling out)  GOALS: Goals reviewed with patient? Yes  SHORT TERM GOALS: Target date: 11/14/23  Pt will be independent with FM coordination HEP Baseline: To be educated  Goal status: IN PROGRESS  2.  Pt will demonstrate improved FM coordination by a score of no more 20.7 seconds on R hand on 9 HPT Baseline: 25.69 Goal status: INITIAL  3.  Pt will demonstrate improved FM coordination in L hand by a score of no more than 23 seconds on 9HPT Baseline: 30.01 Goal status: INITIAL  LONG TERM GOALS: Target date: 11/26/23  Pt will be independent with educated tremor mgmt Baseline: 10/22/23 Handout provided and educated  Goal status: INITIAL  2.  Pt will demonstrate good understanding of compensatory sweeping technique Baseline: 10/22/23 educated, continue to follow up   10/29/23: pt reports obtaining vacuum cleaner that works on bare floors, did demonstrate understanding on 10/22/23.  Goal status: MET  ASSESSMENT:  CLINICAL IMPRESSION: Patient demonstrates good understanding of coordination activities and reports not needing compensatory sweeping secondary to obtaining vacuum that works on bare floors, but did demonstrate understanding previously of compensatory techniques. Demonstrated good capability to manipulate small items in B hands and no dropping of items noted in either hands. Would benefit from continued skilled services to address FM and grip strength further and obtain updated measurements.  PERFORMANCE DEFICITS: in functional skills including coordination, Fine motor control, and tremors, and psychosocial skills including routines and behaviors.   IMPAIRMENTS: are  limiting patient from IADLs and leisure.   CO-MORBIDITIES: may have co-morbidities  that affects occupational performance. Patient will benefit from skilled OT to address above impairments and improve overall function.  REHAB POTENTIAL: Fair noted not always adhering to HEPs with PT  PLAN:  OT FREQUENCY: 1x/week  OT DURATION: 6 weeks  PLANNED INTERVENTIONS: 97168 OT Re-evaluation, 97535 self care/ADL training, 02889 therapeutic exercise, 97530 therapeutic activity, 97112 neuromuscular re-education, 97140 manual therapy, 97760 Orthotic Initial, [id number] Orthotic/Prosthetic subsequent, psychosocial skills training, energy conservation, coping strategies training, patient/family education, and DME and/or AE instructions  RECOMMENDED OTHER SERVICES: none at this time  CONSULTED AND AGREED WITH PLAN OF CARE: Patient  PLAN FOR NEXT SESSION:  F/u obtaining cards F/u tremor mgmt  Continued FM and grip activities   [employer], OT 10/29/2023, 2:05 PM

## 2023-10-31 ENCOUNTER — Encounter: Payer: Self-pay | Admitting: Physical Therapy

## 2023-10-31 ENCOUNTER — Ambulatory Visit: Payer: Medicare (Managed Care) | Admitting: Physical Therapy

## 2023-10-31 VITALS — BP 152/65 | HR 55

## 2023-10-31 DIAGNOSIS — R269 Unspecified abnormalities of gait and mobility: Secondary | ICD-10-CM

## 2023-10-31 DIAGNOSIS — R2681 Unsteadiness on feet: Secondary | ICD-10-CM

## 2023-10-31 DIAGNOSIS — R278 Other lack of coordination: Secondary | ICD-10-CM | POA: Diagnosis not present

## 2023-10-31 DIAGNOSIS — R293 Abnormal posture: Secondary | ICD-10-CM

## 2023-10-31 DIAGNOSIS — Z9181 History of falling: Secondary | ICD-10-CM

## 2023-10-31 DIAGNOSIS — M6281 Muscle weakness (generalized): Secondary | ICD-10-CM

## 2023-10-31 DIAGNOSIS — R29898 Other symptoms and signs involving the musculoskeletal system: Secondary | ICD-10-CM

## 2023-10-31 NOTE — Therapy (Unsigned)
 OUTPATIENT PHYSICAL THERAPY NEURO TREATMENT - 10th VISIT PN/RECERTIFICATION   Patient Name: [name] MRN: [id number] DOB:[birth year], 89 y.o., female Today's Date: 10/31/2023   PCP: Auston Opal, DO REFERRING PROVIDER: Auston Opal, DO  PT progress note for Niels Gu.  Reporting period 09/20/2023 to 10/31/2023  See Note below for Objective Data and Assessment of Progress/Goals  Thank you for the referral of this patient. Daved Bull, PT, DPT  END OF SESSION:  PT End of Session - 10/31/23 1450     Visit Number 10    Number of Visits 13    Date for Recertification  11/08/23    Authorization Type [employer] Advantage    PT Start Time 1445    Equipment Utilized During Treatment Gait belt    Activity Tolerance Patient tolerated treatment well    Behavior During Therapy WFL for tasks assessed/performed              Past Medical History:  Diagnosis Date   Arthritis    Atrial fibrillation (HCC)    Carotid artery occlusion    Colitis    COPD (chronic obstructive pulmonary disease) (HCC)    Diverticulitis    Hypertension    Keratosis, seborrheic    Lumbar spondylosis    w/impingement L3-S1   Past Surgical History:  Procedure Laterality Date   ABDOMINAL HYSTERECTOMY     BREAST EXCISIONAL BIOPSY Left    BREAST EXCISIONAL BIOPSY Left    BREAST EXCISIONAL BIOPSY Right    BREAST SURGERY     cyst removal   CAROTID ENDARTERECTOMY Left 2010   CHOLECYSTECTOMY     EYE SURGERY Bilateral 06/2014   cataracts   JOINT REPLACEMENT     RIGHT KNEE   REPLACEMENT TOTAL KNEE Right    Patient Active Problem List   Diagnosis Date Noted   Cerebellar stroke (HCC) 12/20/2021   Vertigo 12/14/2021   CVA (cerebral vascular accident) (HCC) 12/14/2021   Hypokalemia 12/14/2021   Leukocytosis 12/14/2021   Essential hypertension 12/14/2021   Anxiety 12/14/2021   Hyperlipidemia 12/14/2021   Carotid artery disease 12/14/2021   Osteoarthritis cervical spine 09/21/2020    Compression fracture of T12 vertebra (HCC) 04/16/2018   Paroxysmal atrial fibrillation (HCC) 03/03/2018   Occlusion and stenosis of carotid artery without mention of cerebral infarction 02/05/2012    ONSET DATE: 08/13/2023 (referral)  REFERRING DIAG: R26.89 (ICD-10-CM) - Other abnormalities of gait and mobility  THERAPY DIAG:  Other lack of coordination  Muscle weakness (generalized)  Other symptoms and signs involving the musculoskeletal system  Unsteadiness on feet  History of falling  Abnormality of gait and mobility  Abnormal posture  Rationale for Evaluation and Treatment: Rehabilitation  SUBJECTIVE:  SUBJECTIVE STATEMENT: Pt presents using RW. She states her balance feels worse and she has had a few close calls despite using her 2WW but has not fallen.  She denies dizziness or headaches or other symptoms.  She states it is not episodes of imbalance but rather a gradual consistent worsening.  She did every one of her HEP exercises yesterday.  Pt accompanied by: Self   PERTINENT HISTORY: A-fib, COPD, hypertension, stroke  PAIN:  Are you having pain? Yes: NPRS scale: 2/10 Pain location: Neck--when turn to L Pain description: achy Pt reports frequent neck pain 2/2 arthritis   PRECAUTIONS: Fall; pt needs to visualize mouth to fully comprehend what she is hearing - Severe hearing loss w/ bilateral hearing aides  RED FLAGS: None   WEIGHT BEARING RESTRICTIONS: No  FALLS: Has patient fallen in last 6 months? Yes. Number of falls 1  LIVING ENVIRONMENT: Lives with: lives alone Lives in: House/apartment Stairs: Yes: External: 1 in back and 3 in front steps; on right going up, on left going up, and can reach both Has following equipment at home: Single point cane, Walker - 2 wheeled,  shower chair, and Grab bars  PLOF: Requires assistive device for independence, Leisure: reading, playing games on the computer, and is driving  PATIENT GOALS: my balance   OBJECTIVE:  Note: Objective measures were completed at Evaluation unless otherwise noted.  DIAGNOSTIC FINDINGS: CTA of head/neck from 11/2021  IMPRESSION: 1. Negative for large vessel occlusion, but Positive for complex plaque at the Left ICA origin where it's difficult to exclude ADHERENT THROMBUS within the vessel lumen (series 12, image 87, but alternatively this appearance might be ulcerated plaque and/or an unusual carotid web. Subsequent stenosis numerically estimated at 60%.   2. Positive also for evidence of cervical Right ICA Fibromuscular Dysplasia (FMD).   3. But otherwise mild for age atherosclerosis, and essentially negative intracranial anterior circulation with no other arterial stenosis identified.   4. Stable CT appearance of the brain, No acute intracranial abnormality.   5. Aortic Atherosclerosis (ICD10-I70.0) and Emphysema (ICD10-J43.9). Tortuous aortic arch.   6. 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  COGNITION: Overall cognitive status: Within functional limits for tasks assessed   SENSATION: Pt reports bilateral peripheral neuropathy    EDEMA: Pt reports frequent swelling     POSTURE: rounded shoulders, forward head, and increased thoracic kyphosis  LOWER EXTREMITY ROM:     Active  Right Eval Left Eval  Hip flexion    Hip extension    Hip abduction    Hip adduction    Hip internal rotation    Hip external rotation    Knee flexion    Knee extension    Ankle dorsiflexion    Ankle plantarflexion    Ankle inversion    Ankle eversion     (Blank rows = not tested)  LOWER EXTREMITY MMT:  Tested in seated position   MMT Right Eval Left Eval  Hip flexion 4- 4  Hip extension    Hip abduction 4+ 4  Hip adduction 4 4-  Hip  internal rotation    Hip external rotation    Knee flexion 5 4+  Knee extension 5 4+  Ankle dorsiflexion 5 5  Ankle plantarflexion    Ankle inversion    Ankle eversion    (Blank rows = not tested)  BED MOBILITY:  Not tested Pt reports difficulty due to high height of bed. Wants to get rid of her headboard but is  unable to do this on her own   TRANSFERS: Sit to stand: SBA  Assistive device utilized: Environmental consultant - 2 wheeled     Stand to sit: SBA  Assistive device utilized: Environmental consultant - 2 wheeled     Bilateral genu valgus, weight shift to R side, heavy BUE reliance   RAMP:  Not tested  CURB:  Not tested  STAIRS: Not tested GAIT: Gait pattern: step through pattern, decreased step length- Right, decreased stride length, decreased hip/knee flexion- Right, decreased ankle dorsiflexion- Right, lateral hip instability, trunk flexed, and poor foot clearance- Right Distance walked: Various clinic distances  Assistive device utilized: Walker - 2 wheeled Level of assistance: SBA Comments: Min cues to stay close to RW to facilitate upright posture and to stay inside RW when turning. Min cues to reduce scuffing of R foot   FUNCTIONAL TESTS:     VITALS  Vitals:   10/31/23 1447  BP: (!) 152/65  Pulse: (!) 55                                                                                                                                TREATMENT:    Self-care/Ther Act  Assessed vitals in LUE (see above) and systolic BP elevated but within limits for session   5xSTS:  31.75 sec no UE support - mildly narrowed BOS 5xSTS:  24.06 sec BUE support - cued to improve width of BOS and maintain upright unsupported posture :  19.50 sec (trial 1), 19.34 sec (trail 2) = 0.52 m/sec OR 1.71 ft/sec w/ 2WW SBA (mild left drift) TUG (trial 1):  29.37 sec w/ 2WW SBA TUG (trial 2):  30.28 sec w/ 2WW SBA Floor Recovery: Patient educated in floor recovery this visit using teach-back for injury assessment  and sequencing of task in clinic setting.  Discussion of transfer of skills to variable scenarios outside the clinic.  Patient has most difficulty with turning onto knees due to pain and functional hip weakness.  Performed 1 time. Caregiver Training:  Caregiver present: Step-daughter - did not participate in transfer.   Level of Assist:  MinA and ModA.    PATIENT EDUCATION: Education details: Continue HEP, discussed possible benefit of medical follow-up with recent decline in goal baselines - pt states she will schedule PCP appt, progress towards goals today and safety w/ floor recovery, process for re-cert and time spent scheduling remaining visits. Person educated: Patient Education method: Explanation and Verbal cues Education comprehension: verbalized understanding, verbal cues required, and needs further education  HOME EXERCISE PROGRAM: Continue HEP To be reviewed from previous POC: [id number]   Chair yoga (replace one day of HEP w/ this): -Diaphragmatic breathing x10 -cat/cow x10 -trunk circles x10 CW > x10 CCW -Sun salutation x10 w/ paced breathing -Sun salutation w/ trunk rotation x10 each side -Assisted neck stretches 5x5 second hold each side -High altar side leans 5x3 seconds each side -Ankle to knee stretch no overpressure  x45 seconds each side -Goddess w/ a twist x3 each side no hold -Forward fold x5 w/ paced breathing  GOALS: Goals reviewed with patient? Yes  SHORT TERM GOALS: Target date: 10/18/2023   Pt will be independent with initial HEP for improved strength, balance, transfers and gait.  Baseline: to be reviewed/updated from previous POC  Goal status: MET  2.  Pt will improve 5 x STS to less than or equal to 16 seconds w/improved anterior weight shift to demonstrate improved functional strength and transfer efficiency.   Baseline: 17.6s w/BUE support and poor body mechanics; 22.65s w/no UE support (9/30) Goal status: NOT MET  3.  Pt will improve gait  velocity to at least 0.72 m/s w/LRAD for improved gait efficiency and independence   Baseline: 0.65 m/s w/RW; 0.48 m/s w/RW (9/30) Goal status: NOT MET   4.  Pt will improve normal TUG to less than or equal to 23 seconds w/LRAD for improved functional mobility and decreased fall risk.  Baseline: 26.59s w/RW; 31.22s w/RW (9/30) Goal status: NOT MET   LONG TERM GOALS: Target date: 11/01/2023   Pt will be independent with final HEP for improved strength, balance, transfers and gait.  Baseline: IND (10/9) Goal status: MET  2.  Pt will improve 5 x STS to less than or equal to 18 seconds w/proper body mechanics and no UE support to demonstrate improved functional strength and transfer efficiency.   Baseline: 17.6s w/improper body mechanics; 22.65s w/no UE support (9/30); 24.06 sec BUE support (10/9) Goal status: NOT MET  3.  Pt will improve gait velocity to at least 0.1m/s w/LRAD for improved gait efficiency and reduced fall risk   Baseline: 0.65 m/s wRW; 0.48 m/s w/RW (9/30); 0.52 m/sec (10/9) Goal status: NOT MET  4.  Pt will improve normal TUG to less than or equal to 23 seconds w/LRAD for improved functional mobility and decreased fall risk.  Baseline: 26.59s w/RW; 31.22s w/RW (9/30); 29.37 sec w/ 2WW SBA (10/9) Goal status: NOT MET  5.  Pt will perform floor transfer w/min A for improved functional strength and fall recovery at home  Baseline: min-modA for both instruction of caregiver and physical performance (10/9) Goal status: PARTIALLY MET  GOALS (at 89/0 re-cert): Goals reviewed with patient? Yes  SHORT TERM GOALS = LONG TERM GOALS: Target date: 11/01/2023   Pt will be independent with final HEP for improved strength, balance, transfers and gait. Baseline: IND (10/9) Goal status: MET  2.  Pt will improve 5 x STS to less than or equal to 18 seconds w/proper body mechanics and no UE support to demonstrate improved functional strength and transfer efficiency.    Baseline: 17.6s w/improper body mechanics; 22.65s w/no UE support (9/30); 24.06 sec BUE support (10/9) Goal status: NOT MET  3.  Pt will improve MCTSIB average score to >/=100/120 to demonstrate improved ankle strategy and vestibular input needed to decrease fall risk.   Baseline:  84.6/120   Goal status:  INITIAL ASSESSMENT:  CLINICAL IMPRESSION: *** Continue POC.    OBJECTIVE IMPAIRMENTS: Abnormal gait, cardiopulmonary status limiting activity, decreased activity tolerance, decreased balance, decreased cognition, decreased endurance, decreased knowledge of condition, decreased knowledge of use of DME, decreased mobility, difficulty walking, decreased strength, decreased safety awareness, impaired perceived functional ability, impaired sensation, improper body mechanics, and pain  ACTIVITY LIMITATIONS: carrying, lifting, bending, standing, squatting, stairs, transfers, bed mobility, locomotion level, and caring for others  PARTICIPATION LIMITATIONS: meal prep, cleaning, laundry, driving, shopping, community activity, and yard  work  PERSONAL FACTORS: Fitness, Transportation, and 1-2 comorbidities: Hx of CVA are also affecting patient's functional outcome.   REHAB POTENTIAL: Good  CLINICAL DECISION MAKING: Evolving/moderate complexity  EVALUATION COMPLEXITY: Moderate  PLAN:  PT FREQUENCY: 2x/week + 1x/wk  PT DURATION: 6 weeks + 4 wks  PLANNED INTERVENTIONS: 97164- PT Re-evaluation, 97750- Physical Performance Testing, 97110-Therapeutic exercises, 97530- Therapeutic activity, 97112- Neuromuscular re-education, 97535- Self Care, 02859- Manual therapy, [phone]- Gait training, [phone]- Aquatic Therapy, [phone]- Electrical stimulation (manual), [phone] (1-2 muscles), 20561 (3+ muscles)- Dry Needling, Patient/Family education, Balance training, Stair training, Joint mobilization, Spinal mobilization, Vestibular training, and DME instructions  PLAN FOR NEXT SESSION:  Review HEP from previous  POC and revise as needed (pt will need to work on more functional strength tasks- sit to stands, standing marches, side stepping, etc.). Work on turns!! Upright posture, endurance - seated aerobics, reaching to L side, high amplitude movements, increased step clearance w/RLE     Daved KATHEE Bull, PT, DPT 10/31/2023, 2:51 PM

## 2023-11-05 ENCOUNTER — Ambulatory Visit: Payer: Medicare (Managed Care)

## 2023-11-05 ENCOUNTER — Ambulatory Visit: Payer: Medicare (Managed Care) | Admitting: Physical Therapy

## 2023-11-05 DIAGNOSIS — M6281 Muscle weakness (generalized): Secondary | ICD-10-CM

## 2023-11-05 DIAGNOSIS — R278 Other lack of coordination: Secondary | ICD-10-CM | POA: Diagnosis not present

## 2023-11-05 DIAGNOSIS — R29898 Other symptoms and signs involving the musculoskeletal system: Secondary | ICD-10-CM

## 2023-11-05 NOTE — Therapy (Signed)
 OUTPATIENT OCCUPATIONAL THERAPY NEURO TREATMENT  Patient Name: [name] MRN: [id number] DOB:03-17-1934, 89 y.o., female Today's Date: 11/05/2023  PCP: Auston Opal, DO REFERRING PROVIDER: Auston Opal, DO  END OF SESSION:  OT End of Session - 11/05/23 1407     Visit Number 5    Number of Visits 6    Date for Recertification  11/26/23    Authorization Type [employer]age    Progress Note Due on Visit 10    OT Start Time 1405    OT Stop Time 1445    OT Time Calculation (min) 40 min    Equipment Utilized During Treatment blocks, clamp at level 1 resistance with silver coil, rubber band    Activity Tolerance Patient tolerated treatment well    Behavior During Therapy WFL for tasks assessed/performed          Past Medical History:  Diagnosis Date   Arthritis    Atrial fibrillation (HCC)    Carotid artery occlusion    Colitis    COPD (chronic obstructive pulmonary disease) (HCC)    Diverticulitis    Hypertension    Keratosis, seborrheic    Lumbar spondylosis    w/impingement L3-S1   Past Surgical History:  Procedure Laterality Date   ABDOMINAL HYSTERECTOMY     BREAST EXCISIONAL BIOPSY Left    BREAST EXCISIONAL BIOPSY Left    BREAST EXCISIONAL BIOPSY Right    BREAST SURGERY     cyst removal   CAROTID ENDARTERECTOMY Left 2010   CHOLECYSTECTOMY     EYE SURGERY Bilateral 06/2014   cataracts   JOINT REPLACEMENT     RIGHT KNEE   REPLACEMENT TOTAL KNEE Right    Patient Active Problem List   Diagnosis Date Noted   Cerebellar stroke (HCC) 12/20/2021   Vertigo 12/14/2021   CVA (cerebral vascular accident) (HCC) 12/14/2021   Hypokalemia 12/14/2021   Leukocytosis 12/14/2021   Essential hypertension 12/14/2021   Anxiety 12/14/2021   Hyperlipidemia 12/14/2021   Carotid artery disease 12/14/2021   Osteoarthritis cervical spine 09/21/2020   Compression fracture of T12 vertebra (HCC) 04/16/2018   Paroxysmal atrial fibrillation (HCC) 03/03/2018   Occlusion  and stenosis of carotid artery without mention of cerebral infarction 02/05/2012   ONSET DATE: 10/04/2023 referral date   REFERRING DIAG:  I69.313 (ICD-10-CM) - Psychomotor deficit following cerebral infarction THERAPY DIAG:  Other lack of coordination  Muscle weakness (generalized)  Other symptoms and signs involving the musculoskeletal system  Rationale for Evaluation and Treatment: Rehabilitation  SUBJECTIVE:   SUBJECTIVE STATEMENT: I was able to find some cards, things are going okay. Putty is a piece of cake.  Pt accompanied by: self  PERTINENT HISTORY: A-fib, COPD, hypertension, stroke   DIAGNOSTIC FINDINGS: CTA of head/neck from 11/2021   IMPRESSION: 1. Negative for large vessel occlusion, but Positive for complex plaque at the Left ICA origin where it's difficult to exclude ADHERENT THROMBUS within the vessel lumen (series 12, image 87, but alternatively this appearance might be ulcerated plaque and/or an unusual carotid web. Subsequent stenosis numerically estimated at 60%.   2. Positive also for evidence of cervical Right ICA Fibromuscular Dysplasia (FMD).   3. But otherwise mild for age atherosclerosis, and essentially negative intracranial anterior circulation with no other arterial stenosis identified.   4. Stable CT appearance of the brain, No acute intracranial abnormality.   5. Aortic Atherosclerosis (ICD10-I70.0) and Emphysema (ICD10-J43.9). Tortuous aortic arch.   6. 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  PRECAUTIONS: Fall, severe hearing loss with B hearing aides   WEIGHT BEARING RESTRICTIONS: No  PAIN:  Are you having pain? No   FALLS: Has patient fallen in last 6 months? Yes. Number of falls 1  LIVING ENVIRONMENT: Lives with: lives with their family and lives alone Lives in: House/apartment Stairs: Yes: External: 1 in back and 3 on front steps steps; on right going up, on left going up, and can  reach both Has following equipment at home: Single point cane, Walker - 2 wheeled, shower chair, and Grab bars (walk in shower, niece's husband converted the bathtub)  PLOF: Mod I for independence, Leisure included: reading, playing computer games, and was driving  PATIENT GOALS: I'd like to be able to play my computer games better. I have difficulty with jigsaw puzzles but I do fine with Solitaire and card games on the computer.  OBJECTIVE:  Note: Objective measures were completed at Evaluation unless otherwise noted.  HAND DOMINANCE: Right  ADLs: Overall ADLs: Independent, but reports extended time required at times for dressing tasks. Transfers/ambulation related to ADLs: Mod I Eating: Mod I Grooming: Mod I UB Dressing: Independent with extended time LB Dressing: Independent with extended time Toileting: Independent with extended time Bathing: Independent with extended time Tub Shower transfers: Independent with extended time Equipment: See above  IADLs: reports not being able to complete sweeping or scrubbing floor d/t poor balance. Shopping: Mod I Light housekeeping: reports not being able to complete sweeping or scrubbing floor d/t poor balance. Meal Prep: Mod I Community mobility: I Medication management: Mod I Financial management: Niece has taken over finances since most recent stroke. Handwriting: 90% legible and shaky handwriting, pt reports having increased tremors But my handwriting has always been not so neat.   MOBILITY STATUS: difficulty with turns and uses FWW.   POSTURE COMMENTS:  rounded shoulders, forward head, and increased thoracic kyphosis Sitting balance: WFL  ACTIVITY TOLERANCE: Activity tolerance: NT  FUNCTIONAL OUTCOME MEASURES: N/T  UPPER EXTREMITY ROM/MMT:   N/T  HAND FUNCTION: Grip strength: Right: 33 lbs; Left: 29 lbs  COORDINATION: 9 Hole Peg test: Right: 25.69 sec; Left: 30.01 sec  SENSATION: B peripheral neuropathy noted in  PT notes. WFL  EDEMA: none  MUSCLE TONE: RUE: Within functional limits and LUE: Within functional limits  COGNITION: Overall cognitive status: Within functional limits for tasks assessed  VISION: Subjective report: Sometimes I have trouble. But it's hard to describe, but really I have no concerns with my vision.  Baseline vision: Wears glasses for distance only and reading Visual history: cataracts; has had surgery for this  VISION ASSESSMENT: To be further assessed in functional context.   Patient has difficulty with following activities due to following visual impairments: To be further assessed in functional context.   PERCEPTION: WFL  PRAXIS: Not tested  OBSERVATIONS: Pt with tremors in B hands, affecting ability to complete desired leisure tasks including computer puzzles.  TREATMENT : 11/05/23 Informed pt of upcoming final visit and plan for that visit, pt verbalized understanding. Pt then engaged in activities honing FM coordination and grip/pinch strength for carryover with ADL and IADL. Utilized B hands to open clothespins of varying resistances (1-8# resistance) and placing on lines of varying girths, then removed. Next utilized clamp at level 1 resistance to pick up blocks and place in designated pattern.  Educated in therapeutic exercise to improve grip strength and FM coordination in B hands with use of rubber band. Completed 20 reps of thumb abduction (palmar and radial)and digit extension/abduction.     Pt was not initially scheduled for final visit, made appt for final visit and will notify treating OT of this d/t no availability for this OT. PATIENT EDUCATION: Education details: Animal nutritionist band exercises  Person educated: Patient Education method: Medical illustrator Education comprehension: verbalized understanding and returned  demonstration  HOME EXERCISE PROGRAM: 10/24/2023: putty HEP; golf solitaire 10/29/23: putty activity (hiding items in putty and pinching/pulling out)  GOALS: Goals reviewed with patient? Yes  SHORT TERM GOALS: Target date: 11/14/23  Pt will be independent with FM coordination HEP Baseline: To be educated  Goal status: IN PROGRESS  2.  Pt will demonstrate improved FM coordination by a score of no more 20.7 seconds on R hand on 9 HPT Baseline: 25.69 Goal status: INITIAL   3.  Pt will demonstrate improved FM coordination in L hand by a score of no more than 23 seconds on 9HPT Baseline: 30.01 Goal status: INITIAL  LONG TERM GOALS: Target date: 11/26/23  Pt will be independent with educated tremor mgmt Baseline: 10/22/23 Handout provided and educated  Goal status: INITIAL  2.  Pt will demonstrate good understanding of compensatory sweeping technique Baseline: 10/22/23 educated, continue to follow up   10/29/23: pt reports obtaining vacuum cleaner that works on bare floors, did demonstrate understanding on 10/22/23.  Goal status: MET  ASSESSMENT:  CLINICAL IMPRESSION: Patient demonstrates good understanding of coordination activities and putty exercises. Pt is in agreement with reassessing and reports no concerns at this time but will notify if any. Pt would benefit from skilled OT services to retake measurements and perform discharge prn.   PERFORMANCE DEFICITS: in functional skills including coordination, Fine motor control, and tremors, and psychosocial skills including routines and behaviors.   IMPAIRMENTS: are limiting patient from IADLs and leisure.   CO-MORBIDITIES: may have co-morbidities  that affects occupational performance. Patient will benefit from skilled OT to address above impairments and improve overall function.  REHAB POTENTIAL: Fair noted not always adhering to HEPs with PT  PLAN:  OT FREQUENCY: 1x/week  OT DURATION: 6 weeks  PLANNED INTERVENTIONS: 97168 OT  Re-evaluation, 97535 self care/ADL training, 02889 therapeutic exercise, 97530 therapeutic activity, 97112 neuromuscular re-education, 97140 manual therapy, 97760 Orthotic Initial, [id number] Orthotic/Prosthetic subsequent, psychosocial skills training, energy conservation, coping strategies training, patient/family education, and DME and/or AE instructions  RECOMMENDED OTHER SERVICES: none at this time  CONSULTED AND AGREED WITH PLAN OF CARE: Patient  PLAN FOR NEXT SESSION:  Re-assess goals Potential discharge?   Rocky Dutch, OT 11/05/2023, 4:55 PM

## 2023-11-06 ENCOUNTER — Ambulatory Visit: Payer: Medicare (Managed Care) | Admitting: Physical Therapy

## 2023-11-06 ENCOUNTER — Encounter: Payer: Self-pay | Admitting: Physical Therapy

## 2023-11-06 VITALS — BP 127/59 | HR 61

## 2023-11-06 DIAGNOSIS — R269 Unspecified abnormalities of gait and mobility: Secondary | ICD-10-CM

## 2023-11-06 DIAGNOSIS — R29898 Other symptoms and signs involving the musculoskeletal system: Secondary | ICD-10-CM

## 2023-11-06 DIAGNOSIS — R278 Other lack of coordination: Secondary | ICD-10-CM

## 2023-11-06 DIAGNOSIS — R293 Abnormal posture: Secondary | ICD-10-CM

## 2023-11-06 DIAGNOSIS — Z9181 History of falling: Secondary | ICD-10-CM

## 2023-11-06 DIAGNOSIS — M6281 Muscle weakness (generalized): Secondary | ICD-10-CM

## 2023-11-06 DIAGNOSIS — R2681 Unsteadiness on feet: Secondary | ICD-10-CM

## 2023-11-06 NOTE — Therapy (Signed)
 OUTPATIENT PHYSICAL THERAPY NEURO TREATMENT   Patient Name: [name] MRN: [id number] DOB:1934-06-19, 89 y.o., female Today's Date: 11/06/2023   PCP: Auston Opal, DO REFERRING PROVIDER: Auston Opal, DO  END OF SESSION:  PT End of Session - 11/06/23 1547     Visit Number 11    Number of Visits 13    Date for Recertification  12/06/23    Authorization Type [employer]    PT Start Time 1541    PT Stop Time 1624    PT Time Calculation (min) 43 min    Equipment Utilized During Treatment Gait belt    Activity Tolerance Patient tolerated treatment well    Behavior During Therapy WFL for tasks assessed/performed              Past Medical History:  Diagnosis Date   Arthritis    Atrial fibrillation (HCC)    Carotid artery occlusion    Colitis    COPD (chronic obstructive pulmonary disease) (HCC)    Diverticulitis    Hypertension    Keratosis, seborrheic    Lumbar spondylosis    w/impingement L3-S1   Past Surgical History:  Procedure Laterality Date   ABDOMINAL HYSTERECTOMY     BREAST EXCISIONAL BIOPSY Left    BREAST EXCISIONAL BIOPSY Left    BREAST EXCISIONAL BIOPSY Right    BREAST SURGERY     cyst removal   CAROTID ENDARTERECTOMY Left 2010   CHOLECYSTECTOMY     EYE SURGERY Bilateral 06/2014   cataracts   JOINT REPLACEMENT     RIGHT KNEE   REPLACEMENT TOTAL KNEE Right    Patient Active Problem List   Diagnosis Date Noted   Cerebellar stroke (HCC) 12/20/2021   Vertigo 12/14/2021   CVA (cerebral vascular accident) (HCC) 12/14/2021   Hypokalemia 12/14/2021   Leukocytosis 12/14/2021   Essential hypertension 12/14/2021   Anxiety 12/14/2021   Hyperlipidemia 12/14/2021   Carotid artery disease 12/14/2021   Osteoarthritis cervical spine 09/21/2020   Compression fracture of T12 vertebra (HCC) 04/16/2018   Paroxysmal atrial fibrillation (HCC) 03/03/2018   Occlusion and stenosis of carotid artery without mention of cerebral infarction  02/05/2012    ONSET DATE: 08/13/2023 (referral)  REFERRING DIAG: R26.89 (ICD-10-CM) - Other abnormalities of gait and mobility  THERAPY DIAG:  Other lack of coordination  Muscle weakness (generalized)  Other symptoms and signs involving the musculoskeletal system  Unsteadiness on feet  History of falling  Abnormality of gait and mobility  Abnormal posture  Rationale for Evaluation and Treatment: Rehabilitation  SUBJECTIVE:  SUBJECTIVE STATEMENT: Pt presents using RW. She states her 2WW is rolling weird.  She reports she has not had her ears checked in a while and feels she should do this.  She denies falls or pain.  Pt accompanied by: Self   PERTINENT HISTORY: A-fib, COPD, hypertension, stroke  PAIN:  Are you having pain? No Pt reports frequent neck pain 2/2 arthritis   PRECAUTIONS: Fall; pt needs to visualize mouth to fully comprehend what she is hearing - Severe hearing loss w/ bilateral hearing aides  RED FLAGS: None   WEIGHT BEARING RESTRICTIONS: No  FALLS: Has patient fallen in last 6 months? Yes. Number of falls 1  LIVING ENVIRONMENT: Lives with: lives alone Lives in: House/apartment Stairs: Yes: External: 1 in back and 3 in front steps; on right going up, on left going up, and can reach both Has following equipment at home: Single point cane, Walker - 2 wheeled, shower chair, and Grab bars  PLOF: Requires assistive device for independence, Leisure: reading, playing games on the computer, and is driving  PATIENT GOALS: my balance   OBJECTIVE:  Note: Objective measures were completed at Evaluation unless otherwise noted.  DIAGNOSTIC FINDINGS: CTA of head/neck from 11/2021  IMPRESSION: 1. Negative for large vessel occlusion, but Positive for complex plaque at the  Left ICA origin where it's difficult to exclude ADHERENT THROMBUS within the vessel lumen (series 12, image 87, but alternatively this appearance might be ulcerated plaque and/or an unusual carotid web. Subsequent stenosis numerically estimated at 60%.   2. Positive also for evidence of cervical Right ICA Fibromuscular Dysplasia (FMD).   3. But otherwise mild for age atherosclerosis, and essentially negative intracranial anterior circulation with no other arterial stenosis identified.   4. Stable CT appearance of the brain, No acute intracranial abnormality.   5. Aortic Atherosclerosis (ICD10-I70.0) and Emphysema (ICD10-J43.9). Tortuous aortic arch.   6. 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  COGNITION: Overall cognitive status: Within functional limits for tasks assessed   SENSATION: Pt reports bilateral peripheral neuropathy    EDEMA: Pt reports frequent swelling     POSTURE: rounded shoulders, forward head, and increased thoracic kyphosis  LOWER EXTREMITY ROM:     Active  Right Eval Left Eval  Hip flexion    Hip extension    Hip abduction    Hip adduction    Hip internal rotation    Hip external rotation    Knee flexion    Knee extension    Ankle dorsiflexion    Ankle plantarflexion    Ankle inversion    Ankle eversion     (Blank rows = not tested)  LOWER EXTREMITY MMT:  Tested in seated position   MMT Right Eval Left Eval  Hip flexion 4- 4  Hip extension    Hip abduction 4+ 4  Hip adduction 4 4-  Hip internal rotation    Hip external rotation    Knee flexion 5 4+  Knee extension 5 4+  Ankle dorsiflexion 5 5  Ankle plantarflexion    Ankle inversion    Ankle eversion    (Blank rows = not tested)  BED MOBILITY:  Not tested Pt reports difficulty due to high height of bed. Wants to get rid of her headboard but is unable to do this on her own   TRANSFERS: Sit to stand: SBA  Assistive device utilized:  Environmental consultant - 2 wheeled     Stand to sit: SBA  Assistive device utilized: Environmental consultant -  2 wheeled     Bilateral genu valgus, weight shift to R side, heavy BUE reliance   RAMP:  Not tested  CURB:  Not tested  STAIRS: Not tested GAIT: Gait pattern: step through pattern, decreased step length- Right, decreased stride length, decreased hip/knee flexion- Right, decreased ankle dorsiflexion- Right, lateral hip instability, trunk flexed, and poor foot clearance- Right Distance walked: Various clinic distances  Assistive device utilized: Walker - 2 wheeled Level of assistance: SBA Comments: Min cues to stay close to RW to facilitate upright posture and to stay inside RW when turning. Min cues to reduce scuffing of R foot   FUNCTIONAL TESTS:     VITALS  Vitals:   11/06/23 1602  BP: (!) 127/59  Pulse: 61                                                                                                                                 TREATMENT:    Self-care/Ther Act    PT assessed 2WW mobility - feel it is the back left glider that is slightly more worn creating extra friction - unable to remove/replace for pt due to lack of parts but she states she may look into getting these replaced.  Arm bike x4 minutes forward then x1.25 minute break followed by x4 minutes backwards on level 1.0 for kinesthetic awareness and postural control. Assessed L BP following arm bike:  see above - WNL - wrote down for pt  NMR: 4-way wall bumps x15 each direction for improved ankle strategy Wall postural correction w/ paced breathing x15; return demo and cues to promote gentle painfree stretch Contralateral reaching for far placed ring arch 1 round left and 1 round right  PATIENT EDUCATION: Education details: Continue HEP Person educated: Patient Education method: Leisure centre manager cues Education comprehension: verbalized understanding, verbal cues required, and needs further education  HOME EXERCISE  PROGRAM: Continue HEP To be reviewed from previous POC: [id number]   Chair yoga (replace one day of HEP w/ this): -Diaphragmatic breathing x10 -cat/cow x10 -trunk circles x10 CW > x10 CCW -Sun salutation x10 w/ paced breathing -Sun salutation w/ trunk rotation x10 each side -Assisted neck stretches 5x5 second hold each side -High altar side leans 5x3 seconds each side -Ankle to knee stretch no overpressure x45 seconds each side -Goddess w/ a twist x3 each side no hold -Forward fold x5 w/ paced breathing  GOALS: Goals reviewed with patient? Yes  SHORT TERM GOALS: Target date: 10/18/2023   Pt will be independent with initial HEP for improved strength, balance, transfers and gait.  Baseline: to be reviewed/updated from previous POC  Goal status: MET  2.  Pt will improve 5 x STS to less than or equal to 16 seconds w/improved anterior weight shift to demonstrate improved functional strength and transfer efficiency.   Baseline: 17.6s w/BUE support and poor body mechanics; 22.65s w/no UE support (9/30) Goal status: NOT MET  3.  Pt will improve gait velocity to at least 0.72 m/s w/LRAD for improved gait efficiency and independence   Baseline: 0.65 m/s w/RW; 0.48 m/s w/RW (9/30) Goal status: NOT MET   4.  Pt will improve normal TUG to less than or equal to 23 seconds w/LRAD for improved functional mobility and decreased fall risk.  Baseline: 26.59s w/RW; 31.22s w/RW (9/30) Goal status: NOT MET   LONG TERM GOALS: Target date: 11/01/2023   Pt will be independent with final HEP for improved strength, balance, transfers and gait.  Baseline: IND (10/9) Goal status: MET  2.  Pt will improve 5 x STS to less than or equal to 18 seconds w/proper body mechanics and no UE support to demonstrate improved functional strength and transfer efficiency.   Baseline: 17.6s w/improper body mechanics; 22.65s w/no UE support (9/30); 24.06 sec BUE support (10/9) Goal status: NOT MET  3.  Pt will  improve gait velocity to at least 0.77m/s w/LRAD for improved gait efficiency and reduced fall risk   Baseline: 0.65 m/s wRW; 0.48 m/s w/RW (9/30); 0.52 m/sec (10/9) Goal status: NOT MET  4.  Pt will improve normal TUG to less than or equal to 23 seconds w/LRAD for improved functional mobility and decreased fall risk.  Baseline: 26.59s w/RW; 31.22s w/RW (9/30); 29.37 sec w/ 2WW SBA (10/9) Goal status: NOT MET  5.  Pt will perform floor transfer w/min A for improved functional strength and fall recovery at home  Baseline: min-modA for both instruction of caregiver and physical performance (10/9) Goal status: PARTIALLY MET  GOALS (at 89/0 re-cert): Goals reviewed with patient? Yes  SHORT TERM GOALS = LONG TERM GOALS: Target date: 11/29/2023   Pt will be independent with final HEP for improved strength, balance, transfers and gait. Baseline: IND (10/9) Goal status: MET  2.  Pt will improve 5 x STS to less than or equal to 18 seconds w/proper body mechanics and no UE support to demonstrate improved functional strength and transfer efficiency.  Baseline: 17.6s w/improper body mechanics; 22.65s w/no UE support (9/30); 24.06 sec BUE support (10/9) Goal status: NOT MET  3.  Pt will improve MCTSIB average score to >/=100/120 to demonstrate improved ankle strategy and vestibular input needed to decrease fall risk.   Baseline:  84.6/120   Goal status:  INITIAL  ASSESSMENT:  CLINICAL IMPRESSION: Reaching remaining challenging and fatiguing for pt today due to fixed kyphotic posture and forward head.  She does well with wall bumps demonstrating good ability to right herself.  She does stand to benefit from further skilled PT interventions to improve general stability and endurance to reduce fall risk and optimize upright mobility w/ LRAD.  Continue POC.    OBJECTIVE IMPAIRMENTS: Abnormal gait, cardiopulmonary status limiting activity, decreased activity tolerance, decreased balance, decreased  cognition, decreased endurance, decreased knowledge of condition, decreased knowledge of use of DME, decreased mobility, difficulty walking, decreased strength, decreased safety awareness, impaired perceived functional ability, impaired sensation, improper body mechanics, and pain  ACTIVITY LIMITATIONS: carrying, lifting, bending, standing, squatting, stairs, transfers, bed mobility, locomotion level, and caring for others  PARTICIPATION LIMITATIONS: meal prep, cleaning, laundry, driving, shopping, community activity, and yard work  PERSONAL FACTORS: Fitness, Transportation, and 1-2 comorbidities: Hx of CVA are also affecting patient's functional outcome.   REHAB POTENTIAL: Good  CLINICAL DECISION MAKING: Evolving/moderate complexity  EVALUATION COMPLEXITY: Moderate  PLAN:  PT FREQUENCY: 2x/week + 1x/wk  PT DURATION: 6 weeks + 4 wks  PLANNED INTERVENTIONS: 02835- PT Re-evaluation, 97750-  Physical Performance Testing, 97110-Therapeutic exercises, 97530- Therapeutic activity, [id number]- Neuromuscular re-education, [phone]- Self Care, 02859- Manual therapy, [phone]- Gait training, [phone]- Aquatic Therapy, [phone]- Electrical stimulation (manual), [phone] (1-2 muscles), [phone] (3+ muscles)- Dry Needling, Patient/Family education, Balance training, Stair training, Joint mobilization, Spinal mobilization, Vestibular training, and DME instructions  PLAN FOR NEXT SESSION:  Review HEP from previous POC and revise as needed (pt will need to work on more functional strength tasks- sit to stands, standing marches, side stepping, etc.). Work on turns!! Upright posture, endurance - seated aerobics, reaching to L side, high amplitude movements, increased step clearance w/RLE, ankle strategy - static perturbations, and FT EC foam  Daved KATHEE Bull, PT, DPT 11/06/2023, 4:32 PM

## 2023-11-07 ENCOUNTER — Ambulatory Visit: Payer: Medicare (Managed Care) | Admitting: Physical Therapy

## 2023-11-14 ENCOUNTER — Ambulatory Visit: Payer: Medicare (Managed Care) | Admitting: Physical Therapy

## 2023-11-14 ENCOUNTER — Encounter: Payer: Self-pay | Admitting: Physical Therapy

## 2023-11-14 DIAGNOSIS — R269 Unspecified abnormalities of gait and mobility: Secondary | ICD-10-CM

## 2023-11-14 DIAGNOSIS — R29898 Other symptoms and signs involving the musculoskeletal system: Secondary | ICD-10-CM

## 2023-11-14 DIAGNOSIS — Z9181 History of falling: Secondary | ICD-10-CM

## 2023-11-14 DIAGNOSIS — M6281 Muscle weakness (generalized): Secondary | ICD-10-CM

## 2023-11-14 DIAGNOSIS — R278 Other lack of coordination: Secondary | ICD-10-CM | POA: Diagnosis not present

## 2023-11-14 DIAGNOSIS — R293 Abnormal posture: Secondary | ICD-10-CM

## 2023-11-14 DIAGNOSIS — R2681 Unsteadiness on feet: Secondary | ICD-10-CM

## 2023-11-14 NOTE — Therapy (Signed)
 OUTPATIENT PHYSICAL THERAPY NEURO TREATMENT   Patient Name: [name] MRN: [id number] DOB:08/21/1934, 89 y.o., female Today's Date: 11/14/2023   PCP: Auston Opal, DO REFERRING PROVIDER: Auston Opal, DO  END OF SESSION:  PT End of Session - 11/14/23 1630     Visit Number 12    Number of Visits 13    Date for Recertification  12/06/23    Authorization Type [employer]    PT Start Time 1622    PT Stop Time 1703    PT Time Calculation (min) 41 min    Equipment Utilized During Treatment Gait belt    Activity Tolerance Patient tolerated treatment well    Behavior During Therapy WFL for tasks assessed/performed              Past Medical History:  Diagnosis Date   Arthritis    Atrial fibrillation (HCC)    Carotid artery occlusion    Colitis    COPD (chronic obstructive pulmonary disease) (HCC)    Diverticulitis    Hypertension    Keratosis, seborrheic    Lumbar spondylosis    w/impingement L3-S1   Past Surgical History:  Procedure Laterality Date   ABDOMINAL HYSTERECTOMY     BREAST EXCISIONAL BIOPSY Left    BREAST EXCISIONAL BIOPSY Left    BREAST EXCISIONAL BIOPSY Right    BREAST SURGERY     cyst removal   CAROTID ENDARTERECTOMY Left 2010   CHOLECYSTECTOMY     EYE SURGERY Bilateral 06/2014   cataracts   JOINT REPLACEMENT     RIGHT KNEE   REPLACEMENT TOTAL KNEE Right    Patient Active Problem List   Diagnosis Date Noted   Cerebellar stroke (HCC) 12/20/2021   Vertigo 12/14/2021   CVA (cerebral vascular accident) (HCC) 12/14/2021   Hypokalemia 12/14/2021   Leukocytosis 12/14/2021   Essential hypertension 12/14/2021   Anxiety 12/14/2021   Hyperlipidemia 12/14/2021   Carotid artery disease 12/14/2021   Osteoarthritis cervical spine 09/21/2020   Compression fracture of T12 vertebra (HCC) 04/16/2018   Paroxysmal atrial fibrillation (HCC) 03/03/2018   Occlusion and stenosis of carotid artery without mention of cerebral infarction  02/05/2012    ONSET DATE: 08/13/2023 (referral)  REFERRING DIAG: R26.89 (ICD-10-CM) - Other abnormalities of gait and mobility  THERAPY DIAG:  Other lack of coordination  Muscle weakness (generalized)  Other symptoms and signs involving the musculoskeletal system  Unsteadiness on feet  History of falling  Abnormality of gait and mobility  Abnormal posture  Rationale for Evaluation and Treatment: Rehabilitation  SUBJECTIVE:  SUBJECTIVE STATEMENT: Pt presents using RW.  She reports she needs to change her appts Wednesday 10/27 due to scheduling conflict.  She denies falls, but neck is bothering her today.  Pt accompanied by: Self   PERTINENT HISTORY: A-fib, COPD, hypertension, stroke  PAIN:  Are you having pain? No Pt reports frequent neck pain 2/2 arthritis   PRECAUTIONS: Fall; pt needs to visualize mouth to fully comprehend what she is hearing - Severe hearing loss w/ bilateral hearing aides  RED FLAGS: None   WEIGHT BEARING RESTRICTIONS: No  FALLS: Has patient fallen in last 6 months? Yes. Number of falls 1  LIVING ENVIRONMENT: Lives with: lives alone Lives in: House/apartment Stairs: Yes: External: 1 in back and 3 in front steps; on right going up, on left going up, and can reach both Has following equipment at home: Single point cane, Walker - 2 wheeled, shower chair, and Grab bars  PLOF: Requires assistive device for independence, Leisure: reading, playing games on the computer, and is driving  PATIENT GOALS: my balance   OBJECTIVE:  Note: Objective measures were completed at Evaluation unless otherwise noted.  DIAGNOSTIC FINDINGS: CTA of head/neck from 11/2021  IMPRESSION: 1. Negative for large vessel occlusion, but Positive for complex plaque at the Left ICA  origin where it's difficult to exclude ADHERENT THROMBUS within the vessel lumen (series 12, image 87, but alternatively this appearance might be ulcerated plaque and/or an unusual carotid web. Subsequent stenosis numerically estimated at 60%.   2. Positive also for evidence of cervical Right ICA Fibromuscular Dysplasia (FMD).   3. But otherwise mild for age atherosclerosis, and essentially negative intracranial anterior circulation with no other arterial stenosis identified.   4. Stable CT appearance of the brain, No acute intracranial abnormality.   5. Aortic Atherosclerosis (ICD10-I70.0) and Emphysema (ICD10-J43.9). Tortuous aortic arch.   6. 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  COGNITION: Overall cognitive status: Within functional limits for tasks assessed   SENSATION: Pt reports bilateral peripheral neuropathy    EDEMA: Pt reports frequent swelling     POSTURE: rounded shoulders, forward head, and increased thoracic kyphosis  LOWER EXTREMITY ROM:     Active  Right Eval Left Eval  Hip flexion    Hip extension    Hip abduction    Hip adduction    Hip internal rotation    Hip external rotation    Knee flexion    Knee extension    Ankle dorsiflexion    Ankle plantarflexion    Ankle inversion    Ankle eversion     (Blank rows = not tested)  LOWER EXTREMITY MMT:  Tested in seated position   MMT Right Eval Left Eval  Hip flexion 4- 4  Hip extension    Hip abduction 4+ 4  Hip adduction 4 4-  Hip internal rotation    Hip external rotation    Knee flexion 5 4+  Knee extension 5 4+  Ankle dorsiflexion 5 5  Ankle plantarflexion    Ankle inversion    Ankle eversion    (Blank rows = not tested)  BED MOBILITY:  Not tested Pt reports difficulty due to high height of bed. Wants to get rid of her headboard but is unable to do this on her own   TRANSFERS: Sit to stand: SBA  Assistive device utilized: Environmental consultant - 2  wheeled     Stand to sit: SBA  Assistive device utilized: Environmental consultant - 2 wheeled     Bilateral  genu valgus, weight shift to R side, heavy BUE reliance   RAMP:  Not tested  CURB:  Not tested  STAIRS: Not tested GAIT: Gait pattern: step through pattern, decreased step length- Right, decreased stride length, decreased hip/knee flexion- Right, decreased ankle dorsiflexion- Right, lateral hip instability, trunk flexed, and poor foot clearance- Right Distance walked: Various clinic distances  Assistive device utilized: Walker - 2 wheeled Level of assistance: SBA Comments: Min cues to stay close to RW to facilitate upright posture and to stay inside RW when turning. Min cues to reduce scuffing of R foot   FUNCTIONAL TESTS:     VITALS  There were no vitals filed for this visit.                                                                                                                                TREATMENT:    TA:  Rescheduled appts for pt and provided print out  TE: STS no UE support x10 Lumbar rollouts midline 2x10, cued to breathe with movement Lateral reach into forward reach x10 3 directions Crossbody reaching in sitting x10 each direction, goal of increased speed of movement  NMR: Firm manual perturbations x1 minute SBA > foam manual perturbations x1 minute SBA-CGA w/ min LOB > FT EC foam 3x variable seconds > FA EC foam 2x variable seconds > FA EO 2x up to 30 seconds > FA EO head turns x2 minutes w/ improved stability > marching on foam, pt has tendency to drift forward needing cues and UE support to return to centered; SBA-CGA  PATIENT EDUCATION: Education details: Continue HEP, appts next week. Person educated: Patient Education method: Explanation and Verbal cues Education comprehension: verbalized understanding, verbal cues required, and needs further education  HOME EXERCISE PROGRAM: Continue HEP To be reviewed from previous POC: [id number]   Chair yoga  (replace one day of HEP w/ this): -Diaphragmatic breathing x10 -cat/cow x10 -trunk circles x10 CW > x10 CCW -Sun salutation x10 w/ paced breathing -Sun salutation w/ trunk rotation x10 each side -Assisted neck stretches 5x5 second hold each side -High altar side leans 5x3 seconds each side -Ankle to knee stretch no overpressure x45 seconds each side -Goddess w/ a twist x3 each side no hold -Forward fold x5 w/ paced breathing  GOALS: Goals reviewed with patient? Yes  SHORT TERM GOALS: Target date: 10/18/2023   Pt will be independent with initial HEP for improved strength, balance, transfers and gait.  Baseline: to be reviewed/updated from previous POC  Goal status: MET  2.  Pt will improve 5 x STS to less than or equal to 16 seconds w/improved anterior weight shift to demonstrate improved functional strength and transfer efficiency.   Baseline: 17.6s w/BUE support and poor body mechanics; 22.65s w/no UE support (9/30) Goal status: NOT MET  3.  Pt will improve gait velocity to at least 0.72 m/s w/LRAD for improved gait efficiency and independence  Baseline: 0.65 m/s w/RW; 0.48 m/s w/RW (9/30) Goal status: NOT MET   4.  Pt will improve normal TUG to less than or equal to 23 seconds w/LRAD for improved functional mobility and decreased fall risk.  Baseline: 26.59s w/RW; 31.22s w/RW (9/30) Goal status: NOT MET   LONG TERM GOALS: Target date: 11/01/2023   Pt will be independent with final HEP for improved strength, balance, transfers and gait.  Baseline: IND (10/9) Goal status: MET  2.  Pt will improve 5 x STS to less than or equal to 18 seconds w/proper body mechanics and no UE support to demonstrate improved functional strength and transfer efficiency.   Baseline: 17.6s w/improper body mechanics; 22.65s w/no UE support (9/30); 24.06 sec BUE support (10/9) Goal status: NOT MET  3.  Pt will improve gait velocity to at least 0.34m/s w/LRAD for improved gait efficiency and  reduced fall risk   Baseline: 0.65 m/s wRW; 0.48 m/s w/RW (9/30); 0.52 m/sec (10/9) Goal status: NOT MET  4.  Pt will improve normal TUG to less than or equal to 23 seconds w/LRAD for improved functional mobility and decreased fall risk.  Baseline: 26.59s w/RW; 31.22s w/RW (9/30); 29.37 sec w/ 2WW SBA (10/9) Goal status: NOT MET  5.  Pt will perform floor transfer w/min A for improved functional strength and fall recovery at home  Baseline: min-modA for both instruction of caregiver and physical performance (10/9) Goal status: PARTIALLY MET  GOALS (at 89/0 re-cert): Goals reviewed with patient? Yes  SHORT TERM GOALS = LONG TERM GOALS: Target date: 11/29/2023   Pt will be independent with final HEP for improved strength, balance, transfers and gait. Baseline: IND (10/9) Goal status: MET  2.  Pt will improve 5 x STS to less than or equal to 18 seconds w/proper body mechanics and no UE support to demonstrate improved functional strength and transfer efficiency.  Baseline: 17.6s w/improper body mechanics; 22.65s w/no UE support (9/30); 24.06 sec BUE support (10/9) Goal status: NOT MET  3.  Pt will improve MCTSIB average score to >/=100/120 to demonstrate improved ankle strategy and vestibular input needed to decrease fall risk.   Baseline:  84.6/120   Goal status:  INITIAL  ASSESSMENT:  CLINICAL IMPRESSION: Focus of skilled session on improving aerobic capacity, crossbody activation, and static stability.  She is most challenged by compliant surfaces.  Her posture continues to play a role in ongoing pain and imbalance.  She stands to benefit from further work on seated aerobics, postural management, and ambulatory mechanics w/ 2WW to optimize functional mobility. Continue POC.    OBJECTIVE IMPAIRMENTS: Abnormal gait, cardiopulmonary status limiting activity, decreased activity tolerance, decreased balance, decreased cognition, decreased endurance, decreased knowledge of condition,  decreased knowledge of use of DME, decreased mobility, difficulty walking, decreased strength, decreased safety awareness, impaired perceived functional ability, impaired sensation, improper body mechanics, and pain  ACTIVITY LIMITATIONS: carrying, lifting, bending, standing, squatting, stairs, transfers, bed mobility, locomotion level, and caring for others  PARTICIPATION LIMITATIONS: meal prep, cleaning, laundry, driving, shopping, community activity, and yard work  PERSONAL FACTORS: Fitness, Transportation, and 1-2 comorbidities: Hx of CVA are also affecting patient's functional outcome.   REHAB POTENTIAL: Good  CLINICAL DECISION MAKING: Evolving/moderate complexity  EVALUATION COMPLEXITY: Moderate  PLAN:  PT FREQUENCY: 2x/week + 1x/wk  PT DURATION: 6 weeks + 4 wks  PLANNED INTERVENTIONS: [id number]- PT Re-evaluation, 97750- Physical Performance Testing, 97110-Therapeutic exercises, 97530- Therapeutic activity, [id number]- Neuromuscular re-education, 97535- Self Care, [id number]- Manual therapy, [id number]- Gait training,  02886- Aquatic Therapy, [phone]- Electrical stimulation (manual), 79439 (1-2 muscles), 20561 (3+ muscles)- Dry Needling, Patient/Family education, Balance training, Stair training, Joint mobilization, Spinal mobilization, Vestibular training, and DME instructions  PLAN FOR NEXT SESSION:  Work on turns!! Upright posture, endurance - seated aerobics, reaching to L side, high amplitude movements, increased step clearance w/RLE  Daved KATHEE Bull, PT, DPT 11/14/2023, 5:32 PM

## 2023-11-18 ENCOUNTER — Ambulatory Visit: Payer: Medicare (Managed Care) | Admitting: Occupational Therapy

## 2023-11-18 ENCOUNTER — Ambulatory Visit: Payer: Medicare (Managed Care) | Admitting: Physical Therapy

## 2023-11-20 ENCOUNTER — Ambulatory Visit: Payer: Self-pay | Admitting: Occupational Therapy

## 2023-11-20 ENCOUNTER — Ambulatory Visit: Payer: Self-pay | Admitting: Physical Therapy

## 2023-11-22 ENCOUNTER — Encounter: Payer: Self-pay | Admitting: Occupational Therapy

## 2023-11-22 DIAGNOSIS — R278 Other lack of coordination: Secondary | ICD-10-CM

## 2023-11-22 DIAGNOSIS — M6281 Muscle weakness (generalized): Secondary | ICD-10-CM

## 2023-11-22 DIAGNOSIS — R29898 Other symptoms and signs involving the musculoskeletal system: Secondary | ICD-10-CM

## 2023-11-22 NOTE — Therapy (Signed)
 OCCUPATIONAL THERAPY DISCHARGE SUMMARY  Visits from Start of Care: 5  Pt contacted clinic and cancelled last remaining visit this week due to sickness. OT contacted pt, who reports she was agreeable to d/c at this time.   Current functional level related to goals / functional outcomes: Unable to assess goals.   Remaining deficits: Ongoing impairments, see tx notes for additional details.   Education / Equipment: Pt has some needed materials and education. See tx notes for more details.    Patient goals were unable to be assessed. Patient is being discharged due to the patient's request.   If additional OT services are recommended, a new OT referral will be required.

## 2023-11-27 ENCOUNTER — Encounter: Payer: Self-pay | Admitting: Physical Therapy

## 2023-11-27 ENCOUNTER — Ambulatory Visit: Payer: Medicare (Managed Care) | Attending: Family Medicine | Admitting: Physical Therapy

## 2023-11-27 VITALS — BP 137/51 | HR 59

## 2023-11-27 DIAGNOSIS — R293 Abnormal posture: Secondary | ICD-10-CM | POA: Insufficient documentation

## 2023-11-27 DIAGNOSIS — M6281 Muscle weakness (generalized): Secondary | ICD-10-CM | POA: Diagnosis not present

## 2023-11-27 DIAGNOSIS — R278 Other lack of coordination: Secondary | ICD-10-CM | POA: Insufficient documentation

## 2023-11-27 DIAGNOSIS — R269 Unspecified abnormalities of gait and mobility: Secondary | ICD-10-CM | POA: Diagnosis not present

## 2023-11-27 DIAGNOSIS — R2681 Unsteadiness on feet: Secondary | ICD-10-CM | POA: Diagnosis not present

## 2023-11-27 DIAGNOSIS — R29898 Other symptoms and signs involving the musculoskeletal system: Secondary | ICD-10-CM | POA: Diagnosis not present

## 2023-11-27 DIAGNOSIS — Z9181 History of falling: Secondary | ICD-10-CM | POA: Insufficient documentation

## 2023-11-27 NOTE — Therapy (Signed)
 OUTPATIENT PHYSICAL THERAPY NEURO TREATMENT - DISCHARGE SUMMARY   Patient Name: [name] MRN: [id number] DOB:1934/02/01, 89 y.o., female Today's Date: 11/27/2023  PHYSICAL THERAPY DISCHARGE SUMMARY  Visits from Start of Care: 13  Current functional level related to goals / functional outcomes: See clinical impression statement.   Remaining deficits: Kyphotic posture, reliance on 2WW for safest ambulation   Education / Equipment: Pain management - making topicals part of her morning routine to help stay ahead of the pain vs catching up to it, trying lidocaine  patches and discussing prescription strength with her MD if feeling some benefit.  Follow-up with eye doctor about eye symptoms and possible solutions for allergies affecting eyes.  Discussed using heat in the mornings or at night before bed for improved rest and pre-activity periods.  Progress towards goals and plan for discharge.  Discussed return as needed in the future.  Stressed continued work on eyes closed portion of HEP and exercise in general.   Patient agrees to discharge. Patient goals were partially met. Patient is being discharged due to pt request and reaching max potential in setting.   PCP: Auston Opal, DO REFERRING PROVIDER: Auston Opal, DO  END OF SESSION:  PT End of Session - 11/27/23 1501     Visit Number 13    Number of Visits 13    Date for Recertification  12/06/23    Authorization Type [employer]age    PT Start Time 1448    PT Stop Time 1526    PT Time Calculation (min) 38 min    Equipment Utilized During Treatment Gait belt    Activity Tolerance Patient tolerated treatment well    Behavior During Therapy WFL for tasks assessed/performed              Past Medical History:  Diagnosis Date   Arthritis    Atrial fibrillation (HCC)    Carotid artery occlusion    Colitis    COPD (chronic obstructive pulmonary disease) (HCC)    Diverticulitis    Hypertension     Keratosis, seborrheic    Lumbar spondylosis    w/impingement L3-S1   Past Surgical History:  Procedure Laterality Date   ABDOMINAL HYSTERECTOMY     BREAST EXCISIONAL BIOPSY Left    BREAST EXCISIONAL BIOPSY Left    BREAST EXCISIONAL BIOPSY Right    BREAST SURGERY     cyst removal   CAROTID ENDARTERECTOMY Left 2010   CHOLECYSTECTOMY     EYE SURGERY Bilateral 06/2014   cataracts   JOINT REPLACEMENT     RIGHT KNEE   REPLACEMENT TOTAL KNEE Right    Patient Active Problem List   Diagnosis Date Noted   Cerebellar stroke (HCC) 12/20/2021   Vertigo 12/14/2021   CVA (cerebral vascular accident) (HCC) 12/14/2021   Hypokalemia 12/14/2021   Leukocytosis 12/14/2021   Essential hypertension 12/14/2021   Anxiety 12/14/2021   Hyperlipidemia 12/14/2021   Carotid artery disease 12/14/2021   Osteoarthritis cervical spine 09/21/2020   Compression fracture of T12 vertebra (HCC) 04/16/2018   Paroxysmal atrial fibrillation (HCC) 03/03/2018   Occlusion and stenosis of carotid artery without mention of cerebral infarction 02/05/2012    ONSET DATE: 08/13/2023 (referral)  REFERRING DIAG: R26.89 (ICD-10-CM) - Other abnormalities of gait and mobility  THERAPY DIAG:  Other lack of coordination  Muscle weakness (generalized)  Other symptoms and signs involving the musculoskeletal system  Unsteadiness on feet  History of falling  Abnormality of gait and mobility  Abnormal posture  Rationale  for Evaluation and Treatment: Rehabilitation  SUBJECTIVE:                                                                                                                                                                                             SUBJECTIVE STATEMENT: Pt presents using RW.  She is having some allergies that have been bothering her eyes especially this week.  She denies falls, but neck is bothering her today when she turns her head.  Pt accompanied by: Self   PERTINENT HISTORY:  A-fib, COPD, hypertension, stroke  PAIN:  Are you having pain? No Pt reports frequent neck pain 2/2 arthritis  - 3-4/10 w/ head rotation (catching)  PRECAUTIONS: Fall; pt needs to visualize mouth to fully comprehend what she is hearing - Severe hearing loss w/ bilateral hearing aides  RED FLAGS: None   WEIGHT BEARING RESTRICTIONS: No  FALLS: Has patient fallen in last 6 months? Yes. Number of falls 1  LIVING ENVIRONMENT: Lives with: lives alone Lives in: House/apartment Stairs: Yes: External: 1 in back and 3 in front steps; on right going up, on left going up, and can reach both Has following equipment at home: Single point cane, Walker - 2 wheeled, shower chair, and Grab bars  PLOF: Requires assistive device for independence, Leisure: reading, playing games on the computer, and is driving  PATIENT GOALS: my balance   OBJECTIVE:  Note: Objective measures were completed at Evaluation unless otherwise noted.  DIAGNOSTIC FINDINGS: CTA of head/neck from 11/2021  IMPRESSION: 1. Negative for large vessel occlusion, but Positive for complex plaque at the Left ICA origin where it's difficult to exclude ADHERENT THROMBUS within the vessel lumen (series 12, image 87, but alternatively this appearance might be ulcerated plaque and/or an unusual carotid web. Subsequent stenosis numerically estimated at 60%.   2. Positive also for evidence of cervical Right ICA Fibromuscular Dysplasia (FMD).   3. But otherwise mild for age atherosclerosis, and essentially negative intracranial anterior circulation with no other arterial stenosis identified.   4. Stable CT appearance of the brain, No acute intracranial abnormality.   5. Aortic Atherosclerosis (ICD10-I70.0) and Emphysema (ICD10-J43.9). Tortuous aortic arch.   6. 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  COGNITION: Overall cognitive status: Within functional limits for tasks  assessed   SENSATION: Pt reports bilateral peripheral neuropathy    EDEMA: Pt reports frequent swelling     POSTURE: rounded shoulders, forward head, and increased thoracic kyphosis  LOWER EXTREMITY ROM:     Active  Right Eval Left Eval  Hip flexion    Hip extension    Hip abduction  Hip adduction    Hip internal rotation    Hip external rotation    Knee flexion    Knee extension    Ankle dorsiflexion    Ankle plantarflexion    Ankle inversion    Ankle eversion     (Blank rows = not tested)  LOWER EXTREMITY MMT:  Tested in seated position   MMT Right Eval Left Eval  Hip flexion 4- 4  Hip extension    Hip abduction 4+ 4  Hip adduction 4 4-  Hip internal rotation    Hip external rotation    Knee flexion 5 4+  Knee extension 5 4+  Ankle dorsiflexion 5 5  Ankle plantarflexion    Ankle inversion    Ankle eversion    (Blank rows = not tested)  BED MOBILITY:  Not tested Pt reports difficulty due to high height of bed. Wants to get rid of her headboard but is unable to do this on her own   TRANSFERS: Sit to stand: SBA  Assistive device utilized: Environmental Consultant - 2 wheeled     Stand to sit: SBA  Assistive device utilized: Environmental Consultant - 2 wheeled     Bilateral genu valgus, weight shift to R side, heavy BUE reliance   RAMP:  Not tested  CURB:  Not tested  STAIRS: Not tested GAIT: Gait pattern: step through pattern, decreased step length- Right, decreased stride length, decreased hip/knee flexion- Right, decreased ankle dorsiflexion- Right, lateral hip instability, trunk flexed, and poor foot clearance- Right Distance walked: Various clinic distances  Assistive device utilized: Walker - 2 wheeled Level of assistance: SBA Comments: Min cues to stay close to RW to facilitate upright posture and to stay inside RW when turning. Min cues to reduce scuffing of R foot   FUNCTIONAL TESTS:     VITALS  Vitals:   11/27/23 1455  BP: (!) 137/51  Pulse: (!) 59                                                                                                                                   TREATMENT:    TA:  Discussed HEP - I know them by heart 5xSTS no UE support:  20.94 sec w/ intermittent back of knees on seat for stability mCTSIB: Condition 1:  30 sec Condition 2:  2.31 sec Condition 3:  8.21 sec Condition 4:  0.63 sec TOTAL:  41.15 sec  PATIENT EDUCATION: Education details: Pain management - making topicals part of her morning routine to help stay ahead of the pain vs catching up to it, trying lidocaine  patches and discussing prescription strength with her MD if feeling some benefit.  Follow-up with eye doctor about eye symptoms and possible solutions for allergies affecting eyes.  Discussed using heat in the mornings or at night before bed for improved rest and pre-activity periods.  Progress towards goals and plan for discharge.  Discussed return as needed in  the future.  Stressed continued work on eyes closed portion of HEP and exercise in general. Person educated: Patient Education method: Leisure Centre Manager cues Education comprehension: verbalized understanding, verbal cues required, and needs further education  HOME EXERCISE PROGRAM: Continue HEP To be reviewed from previous POC: [id number]   Chair yoga (replace one day of HEP w/ this): -Diaphragmatic breathing x10 -cat/cow x10 -trunk circles x10 CW > x10 CCW -Sun salutation x10 w/ paced breathing -Sun salutation w/ trunk rotation x10 each side -Assisted neck stretches 5x5 second hold each side -High altar side leans 5x3 seconds each side -Ankle to knee stretch no overpressure x45 seconds each side -Goddess w/ a twist x3 each side no hold -Forward fold x5 w/ paced breathing  GOALS: Goals reviewed with patient? Yes  SHORT TERM GOALS: Target date: 10/18/2023   Pt will be independent with initial HEP for improved strength, balance, transfers and gait.  Baseline: to be  reviewed/updated from previous POC  Goal status: MET  2.  Pt will improve 5 x STS to less than or equal to 16 seconds w/improved anterior weight shift to demonstrate improved functional strength and transfer efficiency.   Baseline: 17.6s w/BUE support and poor body mechanics; 22.65s w/no UE support (9/30) Goal status: NOT MET  3.  Pt will improve gait velocity to at least 0.72 m/s w/LRAD for improved gait efficiency and independence   Baseline: 0.65 m/s w/RW; 0.48 m/s w/RW (9/30) Goal status: NOT MET   4.  Pt will improve normal TUG to less than or equal to 23 seconds w/LRAD for improved functional mobility and decreased fall risk.  Baseline: 26.59s w/RW; 31.22s w/RW (9/30) Goal status: NOT MET   LONG TERM GOALS: Target date: 11/01/2023   Pt will be independent with final HEP for improved strength, balance, transfers and gait.  Baseline: IND (10/9) Goal status: MET  2.  Pt will improve 5 x STS to less than or equal to 18 seconds w/proper body mechanics and no UE support to demonstrate improved functional strength and transfer efficiency.   Baseline: 17.6s w/improper body mechanics; 22.65s w/no UE support (9/30); 24.06 sec BUE support (10/9) Goal status: NOT MET  3.  Pt will improve gait velocity to at least 0.26m/s w/LRAD for improved gait efficiency and reduced fall risk   Baseline: 0.65 m/s wRW; 0.48 m/s w/RW (9/30); 0.52 m/sec (10/9) Goal status: NOT MET  4.  Pt will improve normal TUG to less than or equal to 23 seconds w/LRAD for improved functional mobility and decreased fall risk.  Baseline: 26.59s w/RW; 31.22s w/RW (9/30); 29.37 sec w/ 2WW SBA (10/9) Goal status: NOT MET  5.  Pt will perform floor transfer w/min A for improved functional strength and fall recovery at home  Baseline: min-modA for both instruction of caregiver and physical performance (10/9) Goal status: PARTIALLY MET  GOALS (at 89/0 re-cert): Goals reviewed with patient? Yes  SHORT TERM GOALS =  LONG TERM GOALS: Target date: 11/29/2023   Pt will be independent with final HEP for improved strength, balance, transfers and gait. Baseline: IND (10/9) Goal status: MET  2.  Pt will improve 5 x STS to less than or equal to 18 seconds w/proper body mechanics and no UE support to demonstrate improved functional strength and transfer efficiency.  Baseline: 17.6s w/improper body mechanics; 22.65s w/no UE support (9/30); 24.06 sec BUE support (10/9); 20.94 sec w/ intermittent back of knees on seat for stability (11/5) Goal status: PARTIALLY MET  3.  Pt  will improve MCTSIB average score to >/=100/120 to demonstrate improved ankle strategy and vestibular input needed to decrease fall risk.   Baseline:  84.6/120; 41.15 sec (11/5)   Goal status:  NOT MET  ASSESSMENT:  CLINICAL IMPRESSION: Pt seen for discharge session with some regression noted with mCTSIB.  She was encouraged to focus on this portion of her HEP to improve this function over time.  She voices being ready for a break from PT, but was encouraged to seek episodic care as needed to maintain independence if noting decline in any capacity.  She remains kyphotic which limits her COM and general stability making her primarily reliant on 2WW for highest level mobility.  At this time she is in agreement to discharge and is independent with HEP and goal of maintaining endurance for upright mobility.  OBJECTIVE IMPAIRMENTS: Abnormal gait, cardiopulmonary status limiting activity, decreased activity tolerance, decreased balance, decreased cognition, decreased endurance, decreased knowledge of condition, decreased knowledge of use of DME, decreased mobility, difficulty walking, decreased strength, decreased safety awareness, impaired perceived functional ability, impaired sensation, improper body mechanics, and pain  ACTIVITY LIMITATIONS: carrying, lifting, bending, standing, squatting, stairs, transfers, bed mobility, locomotion level, and caring  for others  PARTICIPATION LIMITATIONS: meal prep, cleaning, laundry, driving, shopping, community activity, and yard work  PERSONAL FACTORS: Fitness, Transportation, and 1-2 comorbidities: Hx of CVA are also affecting patient's functional outcome.   REHAB POTENTIAL: Good  CLINICAL DECISION MAKING: Evolving/moderate complexity  EVALUATION COMPLEXITY: Moderate  PLAN:  PT FREQUENCY: 2x/week + 1x/wk  PT DURATION: 6 weeks + 4 wks  PLANNED INTERVENTIONS: 02835- PT Re-evaluation, 97750- Physical Performance Testing, 97110-Therapeutic exercises, 97530- Therapeutic activity, [id number]- Neuromuscular re-education, 97535- Self Care, 02859- Manual therapy, [phone]- Gait training, [phone]- Aquatic Therapy, [phone]- Electrical stimulation (manual), [phone] (1-2 muscles), [phone] (3+ muscles)- Dry Needling, Patient/Family education, Balance training, Stair training, Joint mobilization, Spinal mobilization, Vestibular training, and DME instructions  PLAN FOR NEXT SESSION:  N/A  Daved KATHEE Bull, PT, DPT 11/27/2023, 3:30 PM

## 2023-12-18 ENCOUNTER — Ambulatory Visit: Payer: Medicare (Managed Care) | Admitting: Family Medicine
